# Patient Record
Sex: Female | Born: 1955 | Race: White | Hispanic: No | Marital: Married | Smoking: Former smoker
Health system: Southern US, Community
[De-identification: ages and names within clinical notes are randomized; demographics above are authoritative.]

## PROBLEM LIST (undated history)

## (undated) DIAGNOSIS — K589 Irritable bowel syndrome without diarrhea: Secondary | ICD-10-CM

## (undated) DIAGNOSIS — J449 Chronic obstructive pulmonary disease, unspecified: Secondary | ICD-10-CM

## (undated) DIAGNOSIS — F3170 Bipolar disorder, currently in remission, most recent episode unspecified: Secondary | ICD-10-CM

## (undated) DIAGNOSIS — J40 Bronchitis, not specified as acute or chronic: Secondary | ICD-10-CM

## (undated) DIAGNOSIS — K5792 Diverticulitis of intestine, part unspecified, without perforation or abscess without bleeding: Secondary | ICD-10-CM

## (undated) DIAGNOSIS — F319 Bipolar disorder, unspecified: Secondary | ICD-10-CM

## (undated) DIAGNOSIS — I10 Essential (primary) hypertension: Secondary | ICD-10-CM

## (undated) DIAGNOSIS — F209 Schizophrenia, unspecified: Secondary | ICD-10-CM

## (undated) DIAGNOSIS — J45909 Unspecified asthma, uncomplicated: Secondary | ICD-10-CM

## (undated) DIAGNOSIS — E079 Disorder of thyroid, unspecified: Secondary | ICD-10-CM

## (undated) DIAGNOSIS — D649 Anemia, unspecified: Secondary | ICD-10-CM

## (undated) DIAGNOSIS — G43909 Migraine, unspecified, not intractable, without status migrainosus: Secondary | ICD-10-CM

## (undated) DIAGNOSIS — G629 Polyneuropathy, unspecified: Secondary | ICD-10-CM

## (undated) HISTORY — PX: FOOT SURGERY: SHX648

## (undated) HISTORY — PX: ABDOMINAL HYSTERECTOMY: SHX81

## (undated) HISTORY — PX: ELBOW SURGERY: SHX618

## (undated) HISTORY — DX: Bronchitis, not specified as acute or chronic: J40

## (undated) HISTORY — PX: KNEE SURGERY: SHX244

## (undated) HISTORY — PX: TONSILLECTOMY: SUR1361

## (undated) HISTORY — DX: Chronic obstructive pulmonary disease, unspecified: J44.9

## (undated) HISTORY — PX: OTHER SURGICAL HISTORY: SHX169

## (undated) HISTORY — DX: Bipolar disorder, currently in remission, most recent episode unspecified: F31.70

## (undated) HISTORY — DX: Disorder of thyroid, unspecified: E07.9

---

## 2000-11-08 ENCOUNTER — Ambulatory Visit (HOSPITAL_COMMUNITY): Admission: RE | Admit: 2000-11-08 | Discharge: 2000-11-08 | Payer: Self-pay | Admitting: Family Medicine

## 2000-11-08 ENCOUNTER — Encounter: Payer: Self-pay | Admitting: Family Medicine

## 2000-11-26 ENCOUNTER — Ambulatory Visit (HOSPITAL_COMMUNITY): Admission: RE | Admit: 2000-11-26 | Discharge: 2000-11-26 | Payer: Self-pay | Admitting: Family Medicine

## 2000-11-26 ENCOUNTER — Encounter: Payer: Self-pay | Admitting: Family Medicine

## 2001-02-18 ENCOUNTER — Ambulatory Visit (HOSPITAL_COMMUNITY): Admission: RE | Admit: 2001-02-18 | Discharge: 2001-02-18 | Payer: Self-pay | Admitting: Family Medicine

## 2001-02-18 ENCOUNTER — Encounter: Payer: Self-pay | Admitting: Family Medicine

## 2001-06-03 ENCOUNTER — Ambulatory Visit (HOSPITAL_COMMUNITY): Admission: RE | Admit: 2001-06-03 | Discharge: 2001-06-03 | Payer: Self-pay | Admitting: Family Medicine

## 2001-06-03 ENCOUNTER — Encounter: Payer: Self-pay | Admitting: Family Medicine

## 2001-06-27 ENCOUNTER — Encounter: Payer: Self-pay | Admitting: *Deleted

## 2001-06-27 ENCOUNTER — Observation Stay (HOSPITAL_COMMUNITY): Admission: EM | Admit: 2001-06-27 | Discharge: 2001-06-27 | Payer: Self-pay | Admitting: *Deleted

## 2001-06-27 ENCOUNTER — Encounter: Payer: Self-pay | Admitting: Family Medicine

## 2001-11-08 ENCOUNTER — Ambulatory Visit (HOSPITAL_COMMUNITY): Admission: RE | Admit: 2001-11-08 | Discharge: 2001-11-08 | Payer: Self-pay | Admitting: Family Medicine

## 2001-11-08 ENCOUNTER — Encounter: Payer: Self-pay | Admitting: Family Medicine

## 2001-12-21 ENCOUNTER — Ambulatory Visit (HOSPITAL_BASED_OUTPATIENT_CLINIC_OR_DEPARTMENT_OTHER): Admission: RE | Admit: 2001-12-21 | Discharge: 2001-12-21 | Payer: Self-pay | Admitting: Orthopedic Surgery

## 2002-06-13 ENCOUNTER — Encounter: Payer: Self-pay | Admitting: Family Medicine

## 2002-06-13 ENCOUNTER — Ambulatory Visit (HOSPITAL_COMMUNITY): Admission: RE | Admit: 2002-06-13 | Discharge: 2002-06-13 | Payer: Self-pay | Admitting: Family Medicine

## 2002-11-02 ENCOUNTER — Ambulatory Visit (HOSPITAL_COMMUNITY): Admission: RE | Admit: 2002-11-02 | Discharge: 2002-11-02 | Payer: Self-pay | Admitting: Family Medicine

## 2002-11-02 ENCOUNTER — Encounter: Payer: Self-pay | Admitting: Family Medicine

## 2002-12-11 ENCOUNTER — Encounter: Payer: Self-pay | Admitting: Family Medicine

## 2002-12-11 ENCOUNTER — Ambulatory Visit (HOSPITAL_COMMUNITY): Admission: RE | Admit: 2002-12-11 | Discharge: 2002-12-11 | Payer: Self-pay | Admitting: Family Medicine

## 2003-03-27 ENCOUNTER — Ambulatory Visit (HOSPITAL_COMMUNITY): Admission: RE | Admit: 2003-03-27 | Discharge: 2003-03-27 | Payer: Self-pay | Admitting: Family Medicine

## 2003-03-27 ENCOUNTER — Encounter: Payer: Self-pay | Admitting: Family Medicine

## 2003-11-28 ENCOUNTER — Ambulatory Visit (HOSPITAL_COMMUNITY): Admission: RE | Admit: 2003-11-28 | Discharge: 2003-11-28 | Payer: Self-pay | Admitting: Family Medicine

## 2004-06-25 ENCOUNTER — Ambulatory Visit (HOSPITAL_COMMUNITY): Admission: RE | Admit: 2004-06-25 | Discharge: 2004-06-25 | Payer: Self-pay | Admitting: Family Medicine

## 2004-07-04 ENCOUNTER — Ambulatory Visit (HOSPITAL_COMMUNITY): Admission: RE | Admit: 2004-07-04 | Discharge: 2004-07-04 | Payer: Self-pay | Admitting: Family Medicine

## 2004-10-12 ENCOUNTER — Emergency Department (HOSPITAL_COMMUNITY): Admission: EM | Admit: 2004-10-12 | Discharge: 2004-10-12 | Payer: Self-pay | Admitting: Emergency Medicine

## 2004-10-29 ENCOUNTER — Ambulatory Visit: Payer: Self-pay | Admitting: Orthopedic Surgery

## 2004-11-23 ENCOUNTER — Emergency Department (HOSPITAL_COMMUNITY): Admission: EM | Admit: 2004-11-23 | Discharge: 2004-11-23 | Payer: Self-pay | Admitting: Emergency Medicine

## 2004-12-03 ENCOUNTER — Ambulatory Visit: Payer: Self-pay | Admitting: Orthopedic Surgery

## 2004-12-09 ENCOUNTER — Ambulatory Visit (HOSPITAL_COMMUNITY): Admission: RE | Admit: 2004-12-09 | Discharge: 2004-12-09 | Payer: Self-pay | Admitting: Orthopedic Surgery

## 2004-12-15 ENCOUNTER — Ambulatory Visit: Payer: Self-pay | Admitting: Orthopedic Surgery

## 2005-01-13 ENCOUNTER — Ambulatory Visit (HOSPITAL_COMMUNITY): Admission: RE | Admit: 2005-01-13 | Discharge: 2005-01-13 | Payer: Self-pay | Admitting: Orthopedic Surgery

## 2005-01-13 ENCOUNTER — Ambulatory Visit: Payer: Self-pay | Admitting: Orthopedic Surgery

## 2005-01-15 ENCOUNTER — Ambulatory Visit: Payer: Self-pay | Admitting: Orthopedic Surgery

## 2005-01-16 ENCOUNTER — Encounter (HOSPITAL_COMMUNITY): Admission: RE | Admit: 2005-01-16 | Discharge: 2005-02-15 | Payer: Self-pay | Admitting: Orthopedic Surgery

## 2005-02-05 ENCOUNTER — Ambulatory Visit: Payer: Self-pay | Admitting: Orthopedic Surgery

## 2005-03-24 ENCOUNTER — Ambulatory Visit (HOSPITAL_COMMUNITY): Admission: RE | Admit: 2005-03-24 | Discharge: 2005-03-24 | Payer: Self-pay | Admitting: Family Medicine

## 2005-04-13 ENCOUNTER — Ambulatory Visit (HOSPITAL_COMMUNITY): Admission: RE | Admit: 2005-04-13 | Discharge: 2005-04-13 | Payer: Self-pay | Admitting: Family Medicine

## 2005-04-15 ENCOUNTER — Ambulatory Visit (HOSPITAL_COMMUNITY): Admission: RE | Admit: 2005-04-15 | Discharge: 2005-04-15 | Payer: Self-pay | Admitting: Family Medicine

## 2005-07-12 ENCOUNTER — Emergency Department (HOSPITAL_COMMUNITY): Admission: EM | Admit: 2005-07-12 | Discharge: 2005-07-12 | Payer: Self-pay | Admitting: Emergency Medicine

## 2005-12-06 ENCOUNTER — Emergency Department (HOSPITAL_COMMUNITY): Admission: EM | Admit: 2005-12-06 | Discharge: 2005-12-06 | Payer: Self-pay | Admitting: Emergency Medicine

## 2006-07-01 ENCOUNTER — Emergency Department (HOSPITAL_COMMUNITY): Admission: EM | Admit: 2006-07-01 | Discharge: 2006-07-01 | Payer: Self-pay | Admitting: Emergency Medicine

## 2006-12-02 ENCOUNTER — Ambulatory Visit (HOSPITAL_COMMUNITY): Admission: RE | Admit: 2006-12-02 | Discharge: 2006-12-02 | Payer: Self-pay | Admitting: Family Medicine

## 2006-12-27 ENCOUNTER — Ambulatory Visit (HOSPITAL_COMMUNITY): Admission: RE | Admit: 2006-12-27 | Discharge: 2006-12-27 | Payer: Self-pay | Admitting: Family Medicine

## 2007-02-02 ENCOUNTER — Ambulatory Visit (HOSPITAL_COMMUNITY): Admission: RE | Admit: 2007-02-02 | Discharge: 2007-02-02 | Payer: Self-pay | Admitting: Family Medicine

## 2007-02-06 ENCOUNTER — Emergency Department (HOSPITAL_COMMUNITY): Admission: EM | Admit: 2007-02-06 | Discharge: 2007-02-06 | Payer: Self-pay | Admitting: Emergency Medicine

## 2007-03-02 ENCOUNTER — Ambulatory Visit (HOSPITAL_COMMUNITY): Admission: RE | Admit: 2007-03-02 | Discharge: 2007-03-02 | Payer: Self-pay | Admitting: Podiatry

## 2007-08-15 ENCOUNTER — Ambulatory Visit: Payer: Self-pay | Admitting: Internal Medicine

## 2007-08-19 ENCOUNTER — Ambulatory Visit: Payer: Self-pay | Admitting: Cardiology

## 2007-08-19 ENCOUNTER — Ambulatory Visit (HOSPITAL_COMMUNITY): Admission: RE | Admit: 2007-08-19 | Discharge: 2007-08-19 | Payer: Self-pay | Admitting: Internal Medicine

## 2007-08-26 ENCOUNTER — Emergency Department (HOSPITAL_COMMUNITY): Admission: EM | Admit: 2007-08-26 | Discharge: 2007-08-26 | Payer: Self-pay | Admitting: Emergency Medicine

## 2007-09-25 ENCOUNTER — Emergency Department (HOSPITAL_COMMUNITY): Admission: EM | Admit: 2007-09-25 | Discharge: 2007-09-25 | Payer: Self-pay | Admitting: Emergency Medicine

## 2008-03-25 ENCOUNTER — Encounter: Payer: Self-pay | Admitting: Orthopedic Surgery

## 2008-03-25 ENCOUNTER — Emergency Department (HOSPITAL_COMMUNITY): Admission: EM | Admit: 2008-03-25 | Discharge: 2008-03-25 | Payer: Self-pay | Admitting: Emergency Medicine

## 2008-03-27 ENCOUNTER — Ambulatory Visit: Payer: Self-pay | Admitting: Orthopedic Surgery

## 2008-03-27 DIAGNOSIS — J8283 Eosinophilic asthma: Secondary | ICD-10-CM | POA: Insufficient documentation

## 2008-03-27 DIAGNOSIS — S52539A Colles' fracture of unspecified radius, initial encounter for closed fracture: Secondary | ICD-10-CM

## 2008-03-27 DIAGNOSIS — J45909 Unspecified asthma, uncomplicated: Secondary | ICD-10-CM | POA: Insufficient documentation

## 2008-03-27 HISTORY — DX: Colles' fracture of unspecified radius, initial encounter for closed fracture: S52.539A

## 2008-03-29 ENCOUNTER — Ambulatory Visit: Payer: Self-pay | Admitting: Orthopedic Surgery

## 2008-03-29 ENCOUNTER — Ambulatory Visit (HOSPITAL_COMMUNITY): Admission: RE | Admit: 2008-03-29 | Discharge: 2008-03-29 | Payer: Self-pay | Admitting: Orthopedic Surgery

## 2008-03-30 ENCOUNTER — Encounter: Payer: Self-pay | Admitting: Orthopedic Surgery

## 2008-04-02 ENCOUNTER — Ambulatory Visit: Payer: Self-pay | Admitting: Orthopedic Surgery

## 2008-04-10 ENCOUNTER — Ambulatory Visit: Payer: Self-pay | Admitting: Orthopedic Surgery

## 2008-04-23 ENCOUNTER — Ambulatory Visit: Payer: Self-pay | Admitting: Orthopedic Surgery

## 2008-04-23 ENCOUNTER — Telehealth: Payer: Self-pay | Admitting: Orthopedic Surgery

## 2008-05-07 ENCOUNTER — Telehealth: Payer: Self-pay | Admitting: Orthopedic Surgery

## 2008-05-21 ENCOUNTER — Ambulatory Visit: Payer: Self-pay | Admitting: Orthopedic Surgery

## 2008-05-23 ENCOUNTER — Telehealth: Payer: Self-pay | Admitting: Orthopedic Surgery

## 2008-05-23 ENCOUNTER — Ambulatory Visit: Payer: Self-pay | Admitting: Orthopedic Surgery

## 2008-05-25 ENCOUNTER — Encounter: Payer: Self-pay | Admitting: Orthopedic Surgery

## 2008-06-04 ENCOUNTER — Ambulatory Visit: Payer: Self-pay | Admitting: Orthopedic Surgery

## 2008-06-11 ENCOUNTER — Encounter (HOSPITAL_COMMUNITY): Admission: RE | Admit: 2008-06-11 | Discharge: 2008-07-11 | Payer: Self-pay | Admitting: Orthopedic Surgery

## 2008-06-11 ENCOUNTER — Encounter: Payer: Self-pay | Admitting: Orthopedic Surgery

## 2008-06-20 ENCOUNTER — Telehealth: Payer: Self-pay | Admitting: Orthopedic Surgery

## 2008-09-13 ENCOUNTER — Encounter: Payer: Self-pay | Admitting: Orthopedic Surgery

## 2008-12-15 ENCOUNTER — Emergency Department (HOSPITAL_COMMUNITY): Admission: EM | Admit: 2008-12-15 | Discharge: 2008-12-15 | Payer: Self-pay | Admitting: Emergency Medicine

## 2008-12-21 ENCOUNTER — Emergency Department (HOSPITAL_COMMUNITY): Admission: EM | Admit: 2008-12-21 | Discharge: 2008-12-21 | Payer: Self-pay | Admitting: Emergency Medicine

## 2008-12-22 ENCOUNTER — Emergency Department (HOSPITAL_COMMUNITY): Admission: EM | Admit: 2008-12-22 | Discharge: 2008-12-22 | Payer: Self-pay | Admitting: Emergency Medicine

## 2009-03-06 ENCOUNTER — Emergency Department (HOSPITAL_COMMUNITY): Admission: EM | Admit: 2009-03-06 | Discharge: 2009-03-06 | Payer: Self-pay | Admitting: Emergency Medicine

## 2009-03-24 ENCOUNTER — Emergency Department (HOSPITAL_COMMUNITY): Admission: EM | Admit: 2009-03-24 | Discharge: 2009-03-24 | Payer: Self-pay | Admitting: Emergency Medicine

## 2009-05-01 ENCOUNTER — Emergency Department (HOSPITAL_COMMUNITY): Admission: EM | Admit: 2009-05-01 | Discharge: 2009-05-01 | Payer: Self-pay | Admitting: Emergency Medicine

## 2009-07-30 ENCOUNTER — Ambulatory Visit (HOSPITAL_COMMUNITY): Admission: RE | Admit: 2009-07-30 | Discharge: 2009-07-30 | Payer: Self-pay | Admitting: Family Medicine

## 2009-08-11 ENCOUNTER — Emergency Department (HOSPITAL_COMMUNITY): Admission: EM | Admit: 2009-08-11 | Discharge: 2009-08-12 | Payer: Self-pay | Admitting: Emergency Medicine

## 2009-09-18 ENCOUNTER — Emergency Department (HOSPITAL_COMMUNITY): Admission: EM | Admit: 2009-09-18 | Discharge: 2009-09-19 | Payer: Self-pay | Admitting: Emergency Medicine

## 2009-10-02 ENCOUNTER — Ambulatory Visit (HOSPITAL_COMMUNITY): Admission: RE | Admit: 2009-10-02 | Discharge: 2009-10-02 | Payer: Self-pay | Admitting: Family Medicine

## 2010-04-10 ENCOUNTER — Ambulatory Visit (HOSPITAL_COMMUNITY): Admission: RE | Admit: 2010-04-10 | Discharge: 2010-04-10 | Payer: Self-pay | Admitting: Family Medicine

## 2010-07-06 ENCOUNTER — Emergency Department (HOSPITAL_COMMUNITY)
Admission: EM | Admit: 2010-07-06 | Discharge: 2010-07-06 | Payer: Self-pay | Source: Home / Self Care | Admitting: Emergency Medicine

## 2010-07-07 ENCOUNTER — Inpatient Hospital Stay (HOSPITAL_COMMUNITY)
Admission: EM | Admit: 2010-07-07 | Discharge: 2010-07-09 | Payer: Self-pay | Source: Home / Self Care | Attending: Family Medicine | Admitting: Family Medicine

## 2010-07-27 ENCOUNTER — Encounter: Payer: Self-pay | Admitting: Family Medicine

## 2010-09-15 LAB — DIFFERENTIAL
Basophils Absolute: 0 10*3/uL (ref 0.0–0.1)
Basophils Relative: 0 % (ref 0–1)
Eosinophils Absolute: 0 10*3/uL (ref 0.0–0.7)
Lymphs Abs: 0.7 10*3/uL (ref 0.7–4.0)
Neutrophils Relative %: 84 % — ABNORMAL HIGH (ref 43–77)

## 2010-09-15 LAB — BASIC METABOLIC PANEL
BUN: 4 mg/dL — ABNORMAL LOW (ref 6–23)
CO2: 28 mEq/L (ref 19–32)
Calcium: 9.4 mg/dL (ref 8.4–10.5)
Creatinine, Ser: 0.83 mg/dL (ref 0.4–1.2)
GFR calc Af Amer: >60 mL/min (ref >60)

## 2010-09-15 LAB — BLOOD GAS, ARTERIAL: pH, Arterial: 7.355 (ref 7.350–7.400)

## 2010-09-15 LAB — CBC
MCH: 30.6 pg (ref 26.0–34.0)
Platelets: 132 10*3/uL — ABNORMAL LOW (ref 150–400)
RBC: 4.71 MIL/uL (ref 3.87–5.11)
RDW: 13.5 % (ref 11.5–15.5)
WBC: 9.9 10*3/uL (ref 4.0–10.5)

## 2010-09-15 LAB — T4, FREE: Free T4: 1.27 ng/dL (ref 0.80–1.80)

## 2010-09-15 LAB — TSH: TSH: 0.888 u[IU]/mL (ref 0.350–4.500)

## 2010-09-24 LAB — URINALYSIS, ROUTINE W REFLEX MICROSCOPIC
Bilirubin Urine: NEGATIVE
Ketones, ur: NEGATIVE mg/dL
Nitrite: NEGATIVE
Protein, ur: NEGATIVE mg/dL
Specific Gravity, Urine: 1.015 (ref 1.005–1.030)
Urobilinogen, UA: 0.2 mg/dL (ref 0.0–1.0)

## 2010-09-24 LAB — CBC
HCT: 37.5 % (ref 36.0–46.0)
Hemoglobin: 12.6 g/dL (ref 12.0–15.0)
MCV: 88.1 fL (ref 78.0–100.0)
Platelets: 165 10*3/uL (ref 150–400)
RDW: 14.1 % (ref 11.5–15.5)

## 2010-09-24 LAB — DIFFERENTIAL
Basophils Absolute: 0 10*3/uL (ref 0.0–0.1)
Eosinophils Absolute: 0 10*3/uL (ref 0.0–0.7)
Eosinophils Relative: 0 % (ref 0–5)
Lymphocytes Relative: 12 % (ref 12–46)
Monocytes Absolute: 0.8 10*3/uL (ref 0.1–1.0)

## 2010-09-24 LAB — BASIC METABOLIC PANEL
BUN: 8 mg/dL (ref 6–23)
CO2: 27 mEq/L (ref 19–32)
Chloride: 101 mEq/L (ref 96–112)
Glucose, Bld: 109 mg/dL — ABNORMAL HIGH (ref 70–99)
Potassium: 3.9 mEq/L (ref 3.5–5.1)
Sodium: 136 mEq/L (ref 135–145)

## 2010-11-18 NOTE — Assessment & Plan Note (Signed)
[facility]                       [facility] OFFICE NOTE   NAME:Kristen Ryan, Kristen Ryan                      MRN:          [id number]  DATE:08/15/2007                            DOB:          06-Feb-1956    REFERRING PHYSICIAN:  Scott A. Gerda Diss, MD   PATIENT IDENTIFICATION:  The patient is a 55 year old who was referred  for evaluation for orthostatic hypotension.   HISTORY OF PRESENT ILLNESS:  The patient said over the past month, she  has had episodes of dizziness that have gotten worse.  She has had them  intermittently in the past.  She denies any frank syncope, however.   On describing the spell, she said they can occur at any time, more often  when she is changing position, but she could be standing.  She will lay  down quickly and rest for awhile and usually the spells resolve in less  than 5 minutes.  Occasionally she feels palpitations prior but not  always.   In talking to her, she gets up at 6 a.m.,  has a couple cups of coffee  and herbal tea, a couple of waters. At lunchtime, she will have cereal  and then a salad at about 3 or 4 o'clock and then a salad in the  evening. No meat or protein with this it appears. She will drink water  at dinner time. She says when she urinates it is a darker yellow.   ALLERGIES:  VISTARIL, TETRACYCLINE, MORPHINE, DEPACON, KLONOPIN,  __________, CODEINE, AMINOPHYLLINE, XANTHINE, ERYTHROMYCIN, TALWIN,  HYDROCODONE, TOPAMAX, DEMEROL, LITHIUM, PENICILLIN.   MEDICATIONS:  1. Abilify 30.  2. Synthroid 50 mcg.  3. Tricor 145.  4. Omeprazole 20.  5. Lamotrigine 200.  6. Benzatropine 2 b.i.d.  7. Xanax 1 t.i.d.  8. Sertraline 50 daily.  9. Ventolin inhaler, has not started yet.   PAST MEDICAL HISTORY:  1. Dizziness.  2. History of asthma.  3. Question history of bipolar disorder.   FAMILY HISTORY:  Negative for CAD.   SOCIAL HISTORY:  The patient is disabled, is divorced with one child.  Does  not smoke.   REVIEW OF SYSTEMS:  The patient had some cyanosis on one side of her  body as a child, this resolved. She  wears glasses.  Occasional  palpitations as noted, occasional reflux, occasional ankle swelling,  otherwise all systems reviewed negative to the above problem except as  noted above. Note the patient when seen in Dr. Fletcher Anon office had a  cortisol done that was normal, ACTH done that was normal.  Lipid panel,  cholesterol of 292, triglycerides 447, HDL of 50. Creatinine 1,  potassium of 5. Note orthostatics in his office were done with immediate  standing. There was no delay in the measurements. Blood pressure lying  150/70, on immediate sitting 120/70, on intermediate standing 110/70. No  pulses taken. Otherwise all systems negative.  See above problem except  as noted.   PHYSICAL EXAM:  The patient currently in no distress.  Blood pressure on  arrival was 116/80. She did note a little dizziness coming into the  office. Orthostatics,  blood pressure laying 100/74, pulse 62, sitting at  2 minutes 120/78, pulse 68, legs were tingling. Standing at 5 minutes  blood pressure 18/70, pulse 64.  HEENT:  Normocephalic, atraumatic, EOMI, PERRL.  Mucous membranes are  moist.  NECK:  JVP is normal.  No thyromegaly or bruits.  CARDIAC:  Regular rate and rhythm, S, S2.  No S3, no murmurs or clicks.  CHEST:  Nontender.  LUNGS:  Clear to auscultation.  ABDOMEN:  Supple, nontender, no hepatomegaly, no masses.  EXTREMITIES:  Good distal pulses.  No lower extremity edema.   A 12-lead EKG shows sinus rhythm at 77 beats per minute, and nonspecific  T-wave changes.   IMPRESSION:  1. Mrs. Kristen Ryan is a 55 year old with a history of dizziness. I did not      find that she was orthostatic today on examination.  On questioning      her, they can occur any time. She gets slight warmth with them,      question slightly vagal. Still she has had no syncope.  Looking at      her diet, she is  not getting too much in during the day.  She says      she is drinking water.  I would like to check a urinalysis and a      CBC today.  I would recommend continuing her medicines for now. I      would as salt tablet daily and increase her fluid intake.  I will      set to see her back in 4-6 weeks.  2. Dyslipidemia followed by Dr. Gerda Diss. Again this will need to be      followed on medical therapy.  3. Congenital cyanosis. I not sure what shunt this represented but it      is resolved  if she had it. I would check an echocardiogram to      confirm chamber sizes function.     Pricilla Riffle, MD, [facility]  Electronically Signed    PVR/MedQ  DD: 08/15/2007  DT: 08/16/2007  Job #: [id number]   cc:   Lorin Picket A. Gerda Diss, MD

## 2010-11-18 NOTE — Op Note (Signed)
NAME:  Kristen Ryan, Kristen Ryan               ACCOUNT NO.:  [id number]   MEDICAL RECORD NO.:  [id number]          PATIENT TYPE:  AMB   LOCATION:  DAY                           FACILITY:  [facility]   PHYSICIAN:  Vickki Hearing, M.D.DATE OF BIRTH:  05/03/1956   DATE OF PROCEDURE:  DATE OF DISCHARGE:                               OPERATIVE REPORT   HISTORY:  This is a 55 year old female fell at home, sustained a closed  fracture of her left distal radius, a Colles fracture.  She had  attempted closed reduction by the emergency room physician.  She was  sent to the office on March 27, 2008.  Evaluation of those films  revealed that the fracture was still displaced and we made  recommendation for surgery.   Informed consent was done in the office.   PREOPERATIVE DIAGNOSIS:  Closed left distal radius fracture, Colles  fracture.   POSTOPERATIVE DIAGNOSIS:  Closed left distal radius fracture, Colles  fracture.   PROCEDURE:  Closed reduction, percutaneous pinning and external  fixation, left wrist.   IMPLANTS:  Two 0.045 K-wires and a Synthes small fragment external  fixator.   SURGEON:  Vickki Hearing, MD.   ANESTHETIC:  General.   OPERATIVE FINDINGS:  Displaced closed left distal radius fracture.   PROCEDURE DETAILS:  The patient was identified in preop holding area.  Left wrist was marked for surgery countersign.  The patient was given  vancomycin due to PENICILLIN allergy with preliminary dose of Benadryl  due to several other drug allergies.  She tolerated that well, was taken  to the operating room where she had general anesthetic.  The left wrist  was then prepped and draped using DuraPrep in sterile technique.   Time-out procedure was completed.   Closed manipulation of the fracture was performed.  C-arm was used to  confirm the reduction.   Two 0.045 K-wires were placed across the fracture site position and  firmed on AP and lateral C-arm shots.   We then elevated  the tourniquet to 225 mmHg.   We made a straight incision over the second metacarpal, divided subcu  tissue.  Blunt dissection carried down to bone.  Subperiosteal  dissection was carried out and 2 half pins were placed in the  metacarpal.  This was confirmed by x-ray.   We then made a second incision in the distal portion of the forearm over  the radius.  Subcutaneous tissue was divided.  Superficial sensory  radial nerve was identified and protected.  Subperiosteal dissection  exposed the radius.  Two half pins were placed and then the fixator was  placed with traction on the arm and locked in place.  Repeat films  showed the fracture to be well reduced.  The wounds were closed with 3-0  nylon after copious amounts of irrigation.  We then injected the wounds  with 0.5% Marcaine.   We applied sterile dressings.  The tourniquet was released.  The hand  had good color, capillary refill as well.  The patient was extubated and  taken to recovery room in stable condition.   Fixation  will stay in place for 8 weeks.  The patient is discharged on  Percocet 7.5/325 one q.4 p.r.n. for pain #60, followup on Tuesday for  dressing change.      Vickki Hearing, M.D.  Electronically Signed     SEH/MEDQ  D:  03/29/2008  T:  03/30/2008  Job:  [id number]

## 2010-11-18 NOTE — Procedures (Signed)
NAMEGURNOOR, SLOOP               ACCOUNT NO.:  [id number]   MEDICAL RECORD NO.:  [id number]          PATIENT TYPE:  OUT   LOCATION:  RAD                           FACILITY:  [facility]   PHYSICIAN:  Gerrit Friends. Dietrich Pates, MD, FACCDATE OF BIRTH:  01-12-1956   DATE OF PROCEDURE:  08/20/2007  DATE OF DISCHARGE:                                ECHOCARDIOGRAM   REFERRING:  Gerda Diss and Pricilla Riffle, MD.   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   1. Technically adequate echocardiographic study.  2. Normal left atrium, right atrium and right ventricle.  3. Normal aortic valve; normal proximal ascending aorta.  4. Normal mitral and tricuspid valves.  5. Normal pulmonic valve; proximal pulmonary artery diameter at the      upper limit of normal.  6. Normal left ventricular size; no LVH; normal regional and global      systolic function.  7. Normal IVC.  8. Physiologic pericardial effusion.      Gerrit Friends. Dietrich Pates, MD, [facility]  Electronically Signed     RMR/MEDQ  D:  08/20/2007  T:  08/22/2007  Job:  [id number]

## 2010-11-18 NOTE — H&P (Signed)
NAMEBETHAN, ADAMEK               ACCOUNT NO.:  [id number]   MEDICAL RECORD NO.:  [id number]          PATIENT TYPE:  AMB   LOCATION:  DAY                           FACILITY:  [facility]   PHYSICIAN:  Cody M. Ulice Brilliant, D.P.M.  DATE OF BIRTH:  1956-04-21   DATE OF ADMISSION:  03/02/2007  DATE OF DISCHARGE:  LH                              HISTORY & PHYSICAL   HISTORY OF PRESENT ILLNESS:  Ms. Yetta Barre has a painful bony prominence on  the dorsal aspect of both feet which has been present for 2 years.  This  pain has caused radiation of pain down into her toes.  This seems to  have started following an injury in which she had a pair of steel-toed  shoes dropped upon the top of her foot followed by dropping a can of  vegetables on the top of her foot.  This caused pain immediately  afterwards, and then she started having discomfort radiating down into  the great and second toe.  Ms. Yetta Barre, when I first saw her was for a  cast shoe.  She was treated conservatively for deep peroneal nerve pain  with injection therapy which decreased her discomfort mildly to  moderately for a period of time only to have the condition return.   PAST MEDICAL HISTORY:  Significant for bipolar disease.   MEDICATIONS:  Her medicines ongoing at this point are Abilify, Ultram,  Flexeril, Synthroid, Cogentin, Sertraline, Lamictal, and Xanax.  She  periodically can and will take a Dilaudid for pain.   Medicines that she has had reactions to or does not tolerate include  VISTARIL, TETRACYCLINE AND ALL MEMBERS OF THIS ANTIBIOTIC CLASS,  MORPHINE, DEPAKOTE, KLONOPIN, RISPERDAL, CODEINE, XANTHINE,  ERYTHROMYCIN, TALWIN, HYDROCODONE, IMITREX, CLOXACILLIN, TOPAMAX,  DEMEROL, and IBUPROFEN.   OBJECTIVE:  Ms. Yetta Barre is a friendly, relatively anxious white female who  is noted over the dorsal aspect of her right foot; namely at the base of  the first metatarsal cuneiform joint, at the base of the second  metatarsal intermediate  cuneiform joint noted to have a fairly large  bony prominence.  Radiographs are taken, and she has a lot of  osteophytic spurring at these two above-noted areas.  The second  metatarsal intermediate cuneiform is the worst.  She does have a  positive Tinel's sign radiating down into her toes upon deep palpation  of the second metatarsal cuneiform saddle bone deformity.   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   PLAN:  She has been treated conservatively with injection therapy.  No  oral medication has been given as she has so many medicines she does not  do well with.  Planned surgical correction will consist of excision of  the hypertrophic exostosis.  This will consist of two incisions as we  cannot get each with one incision unless we went over the dorsal aspect  of her foot in a transverse fashion which may possibly cause  neurovascular compromise.  We will do this under monitored anesthesia  care at [facility].  Postoperatively, we  will probably go with oxycodone  as she has not  had a contraindication to taking that.  I think Dilaudid probably would  be a little too strong.  We will see her postoperatively in [place] as  Wednesday will be my last day in the [place] office, and we discussed  that prior to the procedure.  She has read the consent form and to my  knowledge apparently understood and signed.                                            ______________________________  Denny Peon. Ulice Brilliant, D.P.M.     CMD/MEDQ  D:  03/01/2007  T:  03/01/2007  Job:  [id number]

## 2010-11-18 NOTE — Op Note (Signed)
Kristen Ryan, Kristen Ryan               ACCOUNT NO.:  [id number]   MEDICAL RECORD NO.:  [id number]          PATIENT TYPE:  AMB   LOCATION:  DAY                           FACILITY:  [facility]   PHYSICIAN:  Cody M. Ulice Brilliant, D.P.M.  DATE OF BIRTH:  Dec 08, 1955   DATE OF PROCEDURE:  03/02/2007  DATE OF DISCHARGE:                               OPERATIVE REPORT   PREOPERATIVE DIAGNOSIS:  Hypertrophic exostosis, dorsal aspect right  foot x2.   POSTOPERATIVE DIAGNOSIS:  Hypertrophic exostosis, dorsal aspect right  foot x2.   PROCEDURE PERFORMED:  Excision of hypertrophic exostosis from base of  first metatarsal medial cuneiform joint.  Excision of hypertrophic  exostosis, base of second metatarsal intermediate cuneiform joint.   SURGEON:  Adam Phenix, D.P.M.   ANESTHESIA:  Monitored anesthesia care.   INDICATIONS FOR SURGERY:  Painful saddle bone deformity over the  dorsal aspect of his right foot.  The patient is noted to distinct  hypertrophic exostosis or bone spurs on the dorsal aspect of this foot.  This has caused problems with pain which shoe gear.  The base of the  second metatarsal intermediate cuneiform joint actually is underlying  the deep peroneal nerve which has caused nerve irritation.  This has  been treated conservatively short-term with injection therapy, although  the pain has recurred.   CLINICAL FINDINGS:  The patient is noted to have enlarged spurs on the  base of the second met cuneiform joint and at the base of the first  metatarsal cuneiform joint which are excised utilizing curved osteotome  and pneumatic rasp.   DESCRIPTION OF PROCEDURE:  Ms. Kristen Ryan was brought into the OR and  placed on table in supine position.  IV sedation was established.  A V  block was applied to proximal to the planned incision site utilizing 10  mL of lidocaine and Marcaine in a one-to-one mixture.  A pneumatic ankle  tourniquet is then applied across Kristen Ryan right ankle over appropriate cast  padding.  Kristen Ryan foot was then prepped and draped in the usual aseptic  fashion.  An Ace bandage utilized to exsanguinate Kristen Ryan foot.  The  tourniquet inflated to 250 mmHg.   PROCEDURE:  (1)  Excision of hypertrophic exostosis from the base of  first metatarsal medial cuneiform joint of right foot.  Attention is  directed to this planned incision site.  A 4 cm slightly curvilinear  skin incision is made overlying the first metatarsal cuneiform joint.  The incision is deepened slowly and meticulously through subcutaneous  tissues with care taken to avoid neural and vascular structures.  All  crossing venous structures that cannot be retracted or clamped,  cauterized, the dissection was carried down to the level of deep fascia.  A deep fascial incision was made.  The periosteal tissues were reflected  away from the underlying bony surface.  The first metatarsal cuneiform  joint is appreciated.  The bony bridging overlying this joint is excised  utilizing curved osteotome and then following this with a rasp.  The  wound was flushed with copious amounts of irrigant.  Retraction is  removed  and the areas felt and found to be free of the above-noted  convexity of bone.  The wound was flushed again and deep fascia was  reapproximated closed 4-0 Vicryl.  Subcutaneous tissues were  reapproximated closed with 4-0 Vicryl in running horizontal mattress  suture and skin is closed with 4-0 Vicryl running subcuticular suture.  (2) Excision of hypertrophic exostosis, dorsal aspect of right foot at  second metatarsal cuneiform joint.  Attention was then directed to this  bony prominence.  A 4 cm slightly curvilinear skin incision was planned  and then carried forth with the #15 blade.  The incision was deepened  via sharp and blunt dissection with slow careful dissection with care  taken to identify the deep peroneal nerve, undermine it and retract it  medially.  The deep fascia was then incised overlying the  joint and  careful dissection is carried forth to get good exposure of this.  This  was then removed utilizing the curved osteotome and mallet along with  the pneumatic rasp.  The wound was flushed and it is found that a  significant and adequate amount of bony resection has been performed,  the wound was flushed.  Deep fascia was reapproximated and closed in 4-0  Vicryl, subcutaneous tissues were approximated and closed with 4-0  Vicryl in a running horizontal mattress suture and skin was closed with  4-0 Vicryl in a subcuticular suture.  Steri-Strips were applied across  both of these incisions and a postoperative injection of Marcaine and  Hexadrol was dispensed.  A Betadine-soaked Adaptic dressing and dry  sterile compressive dressing follows.  The tourniquet was then deflated  with tourniquet time spanning 51 minutes.   Kristen Ryan tolerated anesthesia and procedure well.  Kristen Ryan is  transported to day hospital without incident.  While there, a list of  written instructions were explained to Kristen Ryan.  Kristen Ryan is written prescription  for Percocet 5/325 Tylenol dispensed 30 one p.o. every four to six hours  p.c. p.r.n. and for Phenergan 25 mg, dispense 30 one p.o. every six  hours if needed for nausea.  Kristen Ryan is instructed on keeping the foot  elevated and keeping the bandage dry.  Kristen Ryan will be seen in 6 days for  first postop visit.           ______________________________  Denny Peon. Ulice Brilliant, D.P.M.     CMD/MEDQ  D:  03/02/2007  T:  03/03/2007  Job:  [id number]

## 2010-11-21 NOTE — Op Note (Signed)
[facility]  Patient:    Kristen Ryan, Kristen Ryan Visit Number: [id number] MRN: [id number]          Service Type: DSU Location: [facility] Attending Physician:  Ronne Binning Dictated by:   Nicki Reaper, M.D. Proc. Date: 12/21/01 Admit Date:  12/21/2001                             Operative Report  PREOPERATIVE DIAGNOSIS:  Tardy ulnar nerve palsy, left elbow.  POSTOPERATIVE DIAGNOSIS:  Tardy ulnar nerve palsy, left elbow.  OPERATION:  Decompression, left ulnar nerve.  SURGEON:   Nicki Reaper, M.D.  ASSISTANT:  Joaquin Courts, R.N.  ANESTHESIA:  General.  DATE OF OPERATION:  December 21, 2001  ANESTHESIOLOGIST:  Bedelia Person, M.D.  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  PROCEDURE:  The patient was brought to the operating room, a general anesthetic carried out without difficulty.  She was prepped and draped using Betadine scrub and solution in the supine position with the left arm free. The limb was exsanguinated with an Esmarch bandage.  A tourniquet placed high on the arm was inflated to 250 mmHg.  A straight incision was made over the medial epicondyle approximately 3-4 cm in length, carried down through subcutaneous tissue.  Bleeders were electrocauterized, neurovascular structures otherwise protected, dissection carried down to the [employer] fascia. This area was opened, identifying the ulnar nerve.  A release was then performed to the arcade of [place].  The medial intermuscular septum was maintained.  Dissection was then carried distally, a fasciotomy of the flexor carpi ulnaris performed.  A significant band was present at [facility] fascia. This was released.  A neurolysis was performed to the nerve but no transposition performed.  The arm was placed through full flexion-extension. No subluxation was  identified.  The wound was irrigated.  The subcutaneous tissue was closed with interrupted 4-0 Vicryl and the skin with subcuticular 4-0 Monocryl sutures.  Steri-Strips were applied, a sterile compressive dressing and splint applied with the elbow in approximately 50 degrees of flexion.  The patient tolerated the procedure well and was taken to the recovery room for observation in satisfactory condition.  DISPOSITION:  She is discharged home to return to [facility] in one week on Vicodin, Phenergan, and Keflex. Dictated by:   Nicki Reaper, M.D. Attending Physician:  Ronne Binning DD:  12/21/01 TD:  12/22/01 Job: [id number]

## 2010-11-21 NOTE — Procedures (Signed)
NAME:  Kristen Ryan, Kristen Ryan               ACCOUNT NO.:  [id number]   MEDICAL RECORD NO.:  [id number]          PATIENT TYPE:  EMS   LOCATION:  ED                            FACILITY:  [facility]   PHYSICIAN:  Edward L. Juanetta Gosling, M.D.DATE OF BIRTH:  06-25-1956   DATE OF PROCEDURE:  12/06/2005  DATE OF DISCHARGE:  12/06/2005                                EKG INTERPRETATION   EKG NUMBER:  [id number].   The rhythm is sinus rhythm with a rate of 50s.  There are Q waves in V1 and  V2 which may indicate a previous anterior infarction and clinical  correlation is suggested.   Abnormal electrocardiogram.      Oneal Deputy. Juanetta Gosling, M.D.  Electronically Signed     ELH/MEDQ  D:  12/07/2005  T:  12/08/2005  Job:  [id number]

## 2010-11-21 NOTE — Op Note (Signed)
NAME:  Kristen Ryan, Kristen Ryan NO.:  [id number]   MEDICAL RECORD NO.:  [id number]          PATIENT TYPE:  AMB   LOCATION:  DAY                           FACILITY:  [facility]   PHYSICIAN:  Vickki Hearing, M.D.DATE OF BIRTH:  05-Jan-1956   DATE OF PROCEDURE:  01/13/2005  DATE OF DISCHARGE:                                 OPERATIVE REPORT   HISTORY:  This is a 55 year old female who fell onto a flexed knee at [facility] in [place], [place] on April 7. She complained of anterior  knee pain, eventually had a MRI which showed a tear of the anterior horn of  the lateral meniscus with degenerative disease in the patellofemoral  compartment and a Baker cyst due. To persistent symptoms, we went ahead and  scheduled her for an arthroscopy of the left knee to address the meniscal  tear and the patellofemoral cartilage damage.   PREOPERATIVE DIAGNOSIS:  Lateral meniscal tear, chondromalacia patella, left  knee.   POSTOPERATIVE DIAGNOSIS:  Chondral flap tear medial condyle, chondromalacia  patella, left knee.   PROCEDURE:  Arthroscopy, left knee.   SURGEON:  Dr. Romeo Apple, no assistants.   ANESTHETIC:  LMA/general.   SPECIMENS:  None.   BLOOD LOSS:  None.   COMPLICATIONS:  None.   OPERATIVE FINDINGS:  There was a chondral flap tear of the medial femoral  condyle on the weightbearing surface. There was a grade 2 chondral lesion of  the patella which was a fissure, and there was chondral damage of the  lateral tibial plateau near the ACL insertion. Procedure was done as  follows:  The patient was identified as Kristen Ryan the preop holding  area. Her left knee was marked as the surgical site, countersigned by the  surgeon. History and physical was updated. Antibiotics were given  prophylactically.   She was taken to the operating room for general anesthesia via LMA  technique. Once successful, her left knee was prepped and draped using  sterile technique after  which we took a time-out as required, and it was  completed including all parameters.   We injected knee with dilute ______________, did a diagnostic arthroscopy  via lateral portal. We established a medial portal, performed a  chondroplasty using a straight motorized shaver. There was approximately 50%  cartilage loss in an area approximately 15 mm wide x 5 mm long.   We then addressed the chondral damage of the lateral tibial plateau. We used  a shaver for that as well. We then took an ArthroCare wand, put it on a low  setting, and contoured the edges of the chondral flap tear on the medial  condyle. The patellofemoral fissure required no treatment.   Knee was irrigated, suctioned dry, injected with 30 cc of Marcaine. Steri-  Strips used to close the portal sites.       SEH/MEDQ  D:  01/13/2005  T:  01/13/2005  Job:  [id number]

## 2010-11-21 NOTE — H&P (Signed)
Kristen Ryan, Kristen Ryan               ACCOUNT NO.:  [id number]   MEDICAL RECORD NO.:  [id number]          PATIENT TYPE:  AMB   LOCATION:  DAY                           FACILITY:  [facility]   PHYSICIAN:  Vickki Hearing, M.D.DATE OF BIRTH:  July 08, 1955   DATE OF ADMISSION:  DATE OF DISCHARGE:  LH                                HISTORY & PHYSICAL   CHIEF COMPLAINT:  Left knee pain.   HISTORY:  This is a 55 year old female who fell on October 10, 2004 at  [facility] in [place], [place].  She fell on a flexed knee and  complained of anterior knee pain.  At that time, she did not limp.  She did  not have a lot of swelling, catching, locking, or giving way, she has just  had pain behind the patella.  We diagnosed her with a patellar contusion  after initial films were normal.  We recommended that physical therapy  initially see the client and try to do exercises at home.  Over the next  several weeks, she continued to have pain and had pain on patellar  compression, pararetinacular symptoms.  Pain along the medial lateral facets  as well as the medial joint line.  Eventually, she came to have an MRI of  the left knee which showed a tear of the anterior horn of the lateral  meniscus, degenerative disease in the patellofemoral compartment, and a  baker's cyst.  Due to her persistent symptoms on June 12, we decided to go  ahead and schedule her for arthroscopy of the left knee to clean out the  lateral meniscal tear and to debride the patellofemoral cartilage if needed.   REVIEW OF SYSTEMS:  She has a history of asthma, bipolar disease,  schizophrenia, immunologic seasonal allergies.  Negative findings under  general, heart, GI, urinary, neuro, endocrine, skin, ENT, and lymph.   ALLERGIES:  1.  VISTARIL.  2.  AMINOPHYLLINE.  3.  ASPIRIN.  4.  PENICILLIN.   PAST MEDICAL HISTORY:  Includes asthma, bipolar disease, and schizophrenia.   She has had a cesarean section, hysterectomy,  episiotomy, laparoscopy,  tonsillectomy.   Pharmacy is [facility].   MEDICATIONS:  Synthroid, Cogentin, Lamictal, Abilify.   FAMILY HISTORY:  Asthma.   SOCIAL HISTORY:  Her primary care physician is Dr. Lilyan Punt.  Social  habits include smoking/alcohol:  No.  Caffeine:  Yes, two cups.  The highest  grade completed is 12 years of high school, three years of college.   PHYSICAL EXAMINATION:  VITAL SIGNS:  Weight 167.  Pulse 76, respiratory rate  16.  GENERAL APPEARANCE:  Grooming, hygiene, and nutrition are normal.  She has  no deformity.  Body habitus is ectomorphic.  CARDIOVASCULAR:  Pulses are normal.  No swelling, edema, or tenderness.  LYMPH NODES:  Without adenopathy.  NEUROLOGIC:  Gait and station are normal.  Her alignment is normal.  There  is no atrophy, contracture, subluxation seen or noted.  Range of motion is  full.  Strength and stability are normal.  Tenderness is noted on the medial  facet.  She has  a positive quadriceps contraction test.  She has pain and  tenderness actually in the lateral joint and lateral patellofemoral  compartment.  Range of motion remains full.  Muscle strength and tone are  normal.  SKIN:  No masses, lesions, or ulcers.  NEUROLOGIC:  Normal reflexes, sensation, and coordination.  She is alert and  oriented x3.   Initial films, again, are normal.  MRI shows torn lateral meniscus, anterior  horn, and patellofemoral cartilage with baker's cyst.   DIAGNOSIS:  Lateral meniscus tear, osteoarthritis of the patellofemoral  joint.   PLAN:  Arthroscopy.  Partial lateral meniscectomy and debridement of  patellofemoral compartment as needed.       SEH/MEDQ  D:  01/12/2005  T:  01/12/2005  Job:  [id number]

## 2010-11-21 NOTE — H&P (Signed)
[facility]  Patient:    Kristen Ryan, Kristen Ryan Visit Number: [id number] MRN: [id number]          Service Type: MED Location: 3A A320 01 Attending Physician:  Lilyan Punt Dictated by:   Lilyan Punt, M.D. Admit Date:  06/26/2001 Discharge Date: 06/27/2001                           History and Physical  SAME DAY OBSERVATION AND DISCHARGE  CHIEF COMPLAINT:  Abdominal pain.  HISTORY OF PRESENT ILLNESS:  This 55 year old white female had fairly sudden onset of right-sided abdominal pain.  She describes it as being very sharp and painful in nature.  She denies any dysuria, or urinary frequency.  She relates moderate nausea, no vomiting, denies diarrhea.  No cough, congestion or shortness of breath, PMH, endometriosis, migraines, depression, asthma, bipolar disease, TAHBSO.  SOCIAL HISTORY:  Divorced, does not smoke, does not drink.  FAMILY HISTORY:  Benign.  ALLERGIES:  The patient has had numerous side effects to medications including BENZODIAZEPINES and NORTRIPTYLINE causes drowsiness.  BETA BLOCKERS cause swelling, TOPAMAX causes hypersomnia so does Risperdal, IMITREX causes the patient to pass out.  The patient also lists PENICILLIN and ERYTHROMYCIN as allergies.  REVIEW OF SYSTEMS:  For above, negative.  MEDICATIONS:  Levbid 300 mg 4 daily, Prozac, 20 mg 1 b.i.d.; Geodan 80 mg b.i.d., felineum 100 mg q.d., Prilosec 20 mg q.d.,  PHYSICAL EXAMINATION:  GENERAL:  Looks to feel ill.  HEENT:  Benign.  CHEST:  CTA, RNL.  HEART:  Regular.  ABDOMEN:  Soft with mild-to-moderate right upper quadrant tenderness.  Patient is somewhat difficult to pinpoint exact area of tenderness.  EXTREMITIES:  No edema.  SKIN:  Warm and dry.  X-RAY AND LABORATORY DATA:  Show a normal white count, normal differential. MET-7 normal.  Liver normal, lipase and amylase normal.  X-ray shows some mild air-fluid levels.  HOSPITAL COURSE:  The patient underwent  ultrasound which was negative.  She continued to have some moderate right upper quadrant and right-sided abdominal pain, but states that the Stadol and Phenergan helped it.  It was noted that she had some repeat labs which were normal.  It is thought that the patient was having most likely a viral illness.  Cannot rule out the possibility of gallbladder dysfunction.  May need HIDA and/or CAT scan, and/or EGD as an outpatient if ongoing pain, but feel that it is reasonable to discharge the patient at this point in time and have her follow up in one week in the office.  She was given a prescription for oxycodone 500 mg, 1-2 q.4h. p.r.n. pain, Phenergan p.r.n. and to follow up if ongoing trouble. Dictated by:   Lilyan Punt, M.D. Attending Physician:  Lilyan Punt DD:  06/27/01 TD:  06/28/01 Job: [id number]

## 2010-11-21 NOTE — Procedures (Signed)
NAME:  Kristen Ryan, Kristen Ryan               ACCOUNT NO.:  [id number]   MEDICAL RECORD NO.:  [id number]          PATIENT TYPE:  EMS   LOCATION:  ED                            FACILITY:  [facility]   PHYSICIAN:  Edward L. Juanetta Gosling, M.D.DATE OF BIRTH:  [birth year]   DATE OF PROCEDURE:  12/06/2005  DATE OF DISCHARGE:  12/06/2005                                EKG INTERPRETATION   EKG NUMBER:  [id number].   The rhythm is sinus rhythm with a rate of 60s.  There are Q waves in V1 and  T2 which may indicate a previous anteroseptal myocardial infarction.  Clinical correlation is suggested.   Abnormal electrocardiogram.      Oneal Deputy. Juanetta Gosling, M.D.  Electronically Signed     ELH/MEDQ  D:  12/07/2005  T:  12/08/2005  Job:  [id number]

## 2011-04-06 LAB — BASIC METABOLIC PANEL
BUN: 10
CO2: 26
Calcium: 9.7
Chloride: 104
Creatinine, Ser: 0.85
GFR calc Af Amer: >60

## 2011-04-06 LAB — CBC
MCHC: 33.8
MCV: 85.9
Platelets: 189
RBC: 4.55
WBC: 8

## 2011-04-07 ENCOUNTER — Ambulatory Visit (HOSPITAL_COMMUNITY)
Admission: RE | Admit: 2011-04-07 | Discharge: 2011-04-07 | Disposition: A | Discharge status: Home / Self Care | Payer: Medicaid Other | Source: Ambulatory Visit | Attending: Family Medicine | Admitting: Family Medicine

## 2011-04-07 ENCOUNTER — Other Ambulatory Visit (HOSPITAL_COMMUNITY): Payer: Self-pay | Admitting: Family Medicine

## 2011-04-07 DIAGNOSIS — M25579 Pain in unspecified ankle and joints of unspecified foot: Secondary | ICD-10-CM | POA: Insufficient documentation

## 2011-04-07 DIAGNOSIS — R52 Pain, unspecified: Secondary | ICD-10-CM

## 2011-04-07 DIAGNOSIS — X58XXXA Exposure to other specified factors, initial encounter: Secondary | ICD-10-CM | POA: Insufficient documentation

## 2011-04-07 DIAGNOSIS — M25476 Effusion, unspecified foot: Secondary | ICD-10-CM | POA: Insufficient documentation

## 2011-04-07 DIAGNOSIS — M25473 Effusion, unspecified ankle: Secondary | ICD-10-CM | POA: Insufficient documentation

## 2011-04-07 DIAGNOSIS — S92919A Unspecified fracture of unspecified toe(s), initial encounter for closed fracture: Secondary | ICD-10-CM | POA: Insufficient documentation

## 2011-04-17 LAB — BASIC METABOLIC PANEL
Calcium: 9.3
Creatinine, Ser: 0.97
GFR calc Af Amer: >60
Sodium: 138

## 2011-04-17 LAB — HEMOGLOBIN AND HEMATOCRIT, BLOOD
HCT: 39.5
Hemoglobin: 13.1

## 2011-04-28 ENCOUNTER — Other Ambulatory Visit (HOSPITAL_COMMUNITY): Payer: Self-pay | Admitting: Family Medicine

## 2011-04-28 DIAGNOSIS — Z139 Encounter for screening, unspecified: Secondary | ICD-10-CM

## 2011-05-11 ENCOUNTER — Ambulatory Visit (HOSPITAL_COMMUNITY)
Admission: RE | Admit: 2011-05-11 | Discharge: 2011-05-11 | Disposition: A | Discharge status: Home / Self Care | Payer: No Typology Code available for payment source | Source: Ambulatory Visit | Attending: Family Medicine | Admitting: Family Medicine

## 2011-05-11 DIAGNOSIS — Z1231 Encounter for screening mammogram for malignant neoplasm of breast: Secondary | ICD-10-CM | POA: Insufficient documentation

## 2011-05-11 DIAGNOSIS — Z139 Encounter for screening, unspecified: Secondary | ICD-10-CM

## 2011-07-10 ENCOUNTER — Ambulatory Visit (HOSPITAL_COMMUNITY)
Admission: RE | Admit: 2011-07-10 | Discharge: 2011-07-10 | Disposition: A | Discharge status: Home / Self Care | Payer: No Typology Code available for payment source | Source: Ambulatory Visit | Attending: Family Medicine | Admitting: Family Medicine

## 2011-07-10 ENCOUNTER — Other Ambulatory Visit (HOSPITAL_COMMUNITY): Payer: Self-pay | Admitting: Family Medicine

## 2011-07-10 DIAGNOSIS — W19XXXA Unspecified fall, initial encounter: Secondary | ICD-10-CM

## 2011-07-10 DIAGNOSIS — M546 Pain in thoracic spine: Secondary | ICD-10-CM | POA: Insufficient documentation

## 2011-07-10 DIAGNOSIS — M545 Low back pain, unspecified: Secondary | ICD-10-CM | POA: Insufficient documentation

## 2011-09-10 ENCOUNTER — Other Ambulatory Visit (HOSPITAL_COMMUNITY): Payer: Self-pay | Admitting: Family Medicine

## 2011-09-10 ENCOUNTER — Ambulatory Visit (HOSPITAL_COMMUNITY)
Admission: RE | Admit: 2011-09-10 | Discharge: 2011-09-10 | Disposition: A | Discharge status: Home / Self Care | Payer: No Typology Code available for payment source | Source: Ambulatory Visit | Attending: Family Medicine | Admitting: Family Medicine

## 2011-09-10 DIAGNOSIS — M791 Myalgia, unspecified site: Secondary | ICD-10-CM

## 2011-09-10 DIAGNOSIS — M545 Low back pain, unspecified: Secondary | ICD-10-CM | POA: Insufficient documentation

## 2011-09-10 DIAGNOSIS — IMO0001 Reserved for inherently not codable concepts without codable children: Secondary | ICD-10-CM | POA: Insufficient documentation

## 2012-01-01 ENCOUNTER — Other Ambulatory Visit (HOSPITAL_COMMUNITY): Payer: Self-pay | Admitting: Family Medicine

## 2012-01-01 ENCOUNTER — Ambulatory Visit (HOSPITAL_COMMUNITY)
Admission: RE | Admit: 2012-01-01 | Discharge: 2012-01-01 | Disposition: A | Discharge status: Home / Self Care | Payer: No Typology Code available for payment source | Source: Ambulatory Visit | Attending: Family Medicine | Admitting: Family Medicine

## 2012-01-01 DIAGNOSIS — M549 Dorsalgia, unspecified: Secondary | ICD-10-CM

## 2012-01-01 DIAGNOSIS — M546 Pain in thoracic spine: Secondary | ICD-10-CM | POA: Insufficient documentation

## 2012-01-01 DIAGNOSIS — M51379 Other intervertebral disc degeneration, lumbosacral region without mention of lumbar back pain or lower extremity pain: Secondary | ICD-10-CM | POA: Insufficient documentation

## 2012-01-01 DIAGNOSIS — M545 Low back pain, unspecified: Secondary | ICD-10-CM | POA: Insufficient documentation

## 2012-01-01 DIAGNOSIS — M5137 Other intervertebral disc degeneration, lumbosacral region: Secondary | ICD-10-CM | POA: Insufficient documentation

## 2012-01-01 DIAGNOSIS — IMO0002 Reserved for concepts with insufficient information to code with codable children: Secondary | ICD-10-CM | POA: Insufficient documentation

## 2012-01-28 ENCOUNTER — Other Ambulatory Visit (HOSPITAL_COMMUNITY): Payer: Self-pay | Admitting: Family Medicine

## 2012-01-28 ENCOUNTER — Ambulatory Visit (HOSPITAL_COMMUNITY)
Admission: RE | Admit: 2012-01-28 | Discharge: 2012-01-28 | Disposition: A | Discharge status: Home / Self Care | Payer: No Typology Code available for payment source | Source: Ambulatory Visit | Attending: Family Medicine | Admitting: Family Medicine

## 2012-01-28 DIAGNOSIS — R0602 Shortness of breath: Secondary | ICD-10-CM | POA: Insufficient documentation

## 2012-01-28 DIAGNOSIS — R0789 Other chest pain: Secondary | ICD-10-CM | POA: Insufficient documentation

## 2012-01-28 DIAGNOSIS — W19XXXA Unspecified fall, initial encounter: Secondary | ICD-10-CM

## 2012-01-28 DIAGNOSIS — S298XXA Other specified injuries of thorax, initial encounter: Secondary | ICD-10-CM | POA: Insufficient documentation

## 2012-04-18 ENCOUNTER — Other Ambulatory Visit (HOSPITAL_COMMUNITY): Payer: Self-pay | Admitting: Family Medicine

## 2012-04-18 DIAGNOSIS — Z139 Encounter for screening, unspecified: Secondary | ICD-10-CM

## 2012-05-12 ENCOUNTER — Ambulatory Visit (HOSPITAL_COMMUNITY): Payer: No Typology Code available for payment source

## 2012-06-21 ENCOUNTER — Encounter (HOSPITAL_COMMUNITY): Payer: Self-pay | Admitting: *Deleted

## 2012-06-21 DIAGNOSIS — R296 Repeated falls: Secondary | ICD-10-CM | POA: Insufficient documentation

## 2012-06-21 DIAGNOSIS — Y929 Unspecified place or not applicable: Secondary | ICD-10-CM | POA: Insufficient documentation

## 2012-06-21 DIAGNOSIS — Z79899 Other long term (current) drug therapy: Secondary | ICD-10-CM | POA: Insufficient documentation

## 2012-06-21 DIAGNOSIS — IMO0002 Reserved for concepts with insufficient information to code with codable children: Secondary | ICD-10-CM | POA: Insufficient documentation

## 2012-06-21 DIAGNOSIS — Y9389 Activity, other specified: Secondary | ICD-10-CM | POA: Insufficient documentation

## 2012-06-21 DIAGNOSIS — X500XXA Overexertion from strenuous movement or load, initial encounter: Secondary | ICD-10-CM | POA: Insufficient documentation

## 2012-06-21 DIAGNOSIS — J45909 Unspecified asthma, uncomplicated: Secondary | ICD-10-CM | POA: Insufficient documentation

## 2012-06-21 NOTE — ED Notes (Signed)
Pt reporting pain in lower back after falling.  Pt reports twisting back and feeling pop.

## 2012-06-22 ENCOUNTER — Emergency Department (HOSPITAL_COMMUNITY)
Admission: EM | Admit: 2012-06-22 | Discharge: 2012-06-22 | Disposition: A | Discharge status: Home / Self Care | Payer: No Typology Code available for payment source | Attending: Emergency Medicine | Admitting: Emergency Medicine

## 2012-06-22 DIAGNOSIS — M549 Dorsalgia, unspecified: Secondary | ICD-10-CM

## 2012-06-22 HISTORY — DX: Unspecified asthma, uncomplicated: J45.909

## 2012-06-22 MED ORDER — ACETAMINOPHEN 325 MG PO TABS: 650.0000 mg | ORAL_TABLET | Freq: Once | ORAL | Status: DC

## 2012-06-22 MED ORDER — HYDROCODONE-ACETAMINOPHEN 5-325 MG PO TABS: 1.0000 | ORAL_TABLET | ORAL | Status: AC | PRN

## 2012-06-22 MED ORDER — HYDROCODONE-ACETAMINOPHEN 5-325 MG PO TABS
2.0000 | ORAL_TABLET | Freq: Once | ORAL | Status: AC
  Administered 2012-06-22: 2 via ORAL
  Filled 2012-06-22: qty 2

## 2012-06-22 MED ORDER — IBUPROFEN 800 MG PO TABS: 800.0000 mg | ORAL_TABLET | Freq: Three times a day (TID) | ORAL | Status: DC
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
## 2012-07-29 ENCOUNTER — Emergency Department (HOSPITAL_COMMUNITY)
Admission: EM | Admit: 2012-07-29 | Discharge: 2012-07-29 | Disposition: A | Discharge status: Home / Self Care | Payer: No Typology Code available for payment source | Attending: Emergency Medicine | Admitting: Emergency Medicine

## 2012-07-29 ENCOUNTER — Emergency Department (HOSPITAL_COMMUNITY): Payer: No Typology Code available for payment source

## 2012-07-29 ENCOUNTER — Encounter (HOSPITAL_COMMUNITY): Payer: Self-pay | Admitting: *Deleted

## 2012-07-29 DIAGNOSIS — J029 Acute pharyngitis, unspecified: Secondary | ICD-10-CM | POA: Insufficient documentation

## 2012-07-29 DIAGNOSIS — R42 Dizziness and giddiness: Secondary | ICD-10-CM | POA: Insufficient documentation

## 2012-07-29 DIAGNOSIS — J440 Chronic obstructive pulmonary disease with acute lower respiratory infection: Secondary | ICD-10-CM | POA: Insufficient documentation

## 2012-07-29 DIAGNOSIS — Z7982 Long term (current) use of aspirin: Secondary | ICD-10-CM | POA: Insufficient documentation

## 2012-07-29 DIAGNOSIS — R05 Cough: Secondary | ICD-10-CM | POA: Insufficient documentation

## 2012-07-29 DIAGNOSIS — J40 Bronchitis, not specified as acute or chronic: Secondary | ICD-10-CM

## 2012-07-29 DIAGNOSIS — Z79899 Other long term (current) drug therapy: Secondary | ICD-10-CM | POA: Insufficient documentation

## 2012-07-29 DIAGNOSIS — R059 Cough, unspecified: Secondary | ICD-10-CM | POA: Insufficient documentation

## 2012-07-29 DIAGNOSIS — R0602 Shortness of breath: Secondary | ICD-10-CM | POA: Insufficient documentation

## 2012-07-29 DIAGNOSIS — R51 Headache: Secondary | ICD-10-CM | POA: Insufficient documentation

## 2012-07-29 DIAGNOSIS — J3489 Other specified disorders of nose and nasal sinuses: Secondary | ICD-10-CM | POA: Insufficient documentation

## 2012-07-29 DIAGNOSIS — IMO0002 Reserved for concepts with insufficient information to code with codable children: Secondary | ICD-10-CM | POA: Insufficient documentation

## 2012-07-29 DIAGNOSIS — J209 Acute bronchitis, unspecified: Secondary | ICD-10-CM | POA: Insufficient documentation

## 2012-07-29 DIAGNOSIS — R0989 Other specified symptoms and signs involving the circulatory and respiratory systems: Secondary | ICD-10-CM | POA: Insufficient documentation

## 2012-07-29 MED ORDER — GUAIFENESIN-CODEINE 100-10 MG/5ML PO SYRP: 5.0000 mL | ORAL_SOLUTION | Freq: Three times a day (TID) | ORAL | Status: DC | PRN

## 2012-07-29 MED ORDER — LEVOFLOXACIN 500 MG PO TABS: 500.0000 mg | ORAL_TABLET | Freq: Every day | ORAL | Status: DC

## 2012-07-29 NOTE — ED Provider Notes (Signed)
History     CSN: [id number]  Arrival date & time 07/29/12  1657   None     Chief Complaint  Patient presents with  . Fever   HPI Kristen Ryan is a 57 y.o. female who presents to the ED with fever. The fever started today and went up to 102. Associated symptoms include cough, sore throat, congestion and lungs feel hot and aching. She denies nausea, vomiting or diarrhea. Has had similar problems when has had asthma or bronchitis. Patient has COPD. Has not smoked in 4 years. Has used inhaler today and does not feel like wheezing or short of breath.  She did receive her influenza vaccine this year. The history was provided by the patient.  Past Medical History  Diagnosis Date  . Asthma     Past Surgical History  Procedure Date  . Abdominal hysterectomy   . Tonsillectomy   . Joint replacement     No family history on file.  History  Substance Use Topics  . Smoking status: Never Smoker   . Smokeless tobacco: Not on file  . Alcohol Use: No    OB History    Grav Para Term Preterm Abortions TAB SAB Ect Mult Living                  Review of Systems  Constitutional: Positive for fever and chills. Negative for diaphoresis and fatigue.  HENT: Positive for congestion and sore throat. Negative for ear pain, facial swelling, neck pain, neck stiffness, dental problem and sinus pressure.   Eyes: Negative for photophobia, pain and discharge.  Respiratory: Positive for cough, shortness of breath and wheezing. Negative for chest tightness.   Cardiovascular: Positive for chest pain (with cough). Negative for palpitations.  Gastrointestinal: Negative for nausea, vomiting, abdominal pain, diarrhea, constipation and abdominal distention.  Genitourinary: Negative for dysuria, urgency, frequency, flank pain, vaginal bleeding, vaginal discharge and difficulty urinating.  Musculoskeletal: Positive for back pain. Negative for myalgias and gait problem.  Skin: Negative for color change and  rash.  Neurological: Positive for light-headedness and headaches. Negative for dizziness, speech difficulty, weakness and numbness.  Psychiatric/Behavioral: Negative for confusion and agitation. The patient is not nervous/anxious.        Hx of Bipolar    Allergies  Aminophylline; Sumatriptan; Theophylline; Divalproex sodium; Pentazocine lactate; Risperidone; and Tetracycline  Home Medications   Current Outpatient Rx  Name  Route  Sig  Dispense  Refill  . ALPRAZOLAM 1 MG PO TABS   Oral   Take 1 mg by mouth 3 (three) times daily as needed.         . ASPIRIN 81 MG PO TABS   Oral   Take 81 mg by mouth daily.         . BUDESONIDE-FORMOTEROL FUMARATE 160-4.5 MCG/ACT IN AERO   Inhalation   Inhale 2 puffs into the lungs 2 (two) times daily.         Marland Kitchen LAMOTRIGINE 200 MG PO TABS   Oral   Take 300 mg by mouth daily.         Marland Kitchen LEVOTHYROXINE SODIUM 50 MCG PO TABS   Oral   Take 50 mcg by mouth daily.         . QUETIAPINE FUMARATE 400 MG PO TABS   Oral   Take 400 mg by mouth at bedtime.         . TOPIRAMATE 100 MG PO TABS   Oral   Take 150 mg by  mouth 2 (two) times daily.            BP 105/92  Pulse 99  Temp 98 F (36.7 C) (Oral)  Resp 16  Ht 5\' 8"  (1.727 m)  Wt 158 lb (71.668 kg)  BMI 24.02 kg/m2  SpO2 100%  Physical Exam  Nursing note and vitals reviewed. Constitutional: She is oriented to person, place, and time. She appears well-developed and well-nourished.  HENT:  Head: Normocephalic and atraumatic.  Mouth/Throat: Uvula is midline and mucous membranes are normal. Posterior oropharyngeal erythema present. No oropharyngeal exudate.  Eyes: EOM are normal. Pupils are equal, round, and reactive to light.  Neck: Neck supple.  Cardiovascular: Normal rate and regular rhythm.   Pulmonary/Chest: Effort normal. No respiratory distress. Wheezes: occasional. She has rales.       Prolonged expirations   Abdominal: Soft. There is no tenderness.    Musculoskeletal: Normal range of motion. She exhibits no edema.  Neurological: She is alert and oriented to person, place, and time. No cranial nerve deficit.  Skin: Skin is warm and dry.  Psychiatric: She has a normal mood and affect. Her behavior is normal. Judgment and thought content normal.   Procedures  Dg Chest 2 View  07/29/2012  *RADIOLOGY REPORT*  Clinical Data: Cough, fever  CHEST - 2 VIEW  Comparison: 01/28/2012  Findings: Cardiomediastinal silhouette is stable.    Hyperinflation again noted.  No acute infiltrate or pulmonary edema.  Degenerative changes thoracic spine.  Stable mild compression deformity mid thoracic spine.  IMPRESSION: No active disease.  Hyperinflation again noted.   Original Report Authenticated By: Natasha Mead, M.D.     Assessment: 57 y.o. female with cough and flu like symptoms`   Bronchitis  Plan:  Levaquin   Robitussin AC   Follow up with PCP, return here as needed I have reviewed this patient's vital signs, nurses notes, appropriate labs and imaging.  I have discussed results with the patient and plan of care. Patient voices understanding.   Medication List     As of 07/29/2012  7:24 PM    START taking these medications         guaiFENesin-codeine 100-10 MG/5ML syrup   Commonly known as: ROBITUSSIN AC   Take 5 mLs by mouth 3 (three) times daily as needed for cough.      levofloxacin 500 MG tablet   Commonly known as: LEVAQUIN   Take 1 tablet (500 mg total) by mouth daily.      ASK your doctor about these medications         ALPRAZolam 1 MG tablet   Commonly known as: XANAX      aspirin 81 MG tablet      budesonide-formoterol 160-4.5 MCG/ACT inhaler   Commonly known as: SYMBICORT      lamoTRIgine 200 MG tablet   Commonly known as: LAMICTAL      levothyroxine 50 MCG tablet   Commonly known as: SYNTHROID, LEVOTHROID      QUEtiapine 400 MG tablet   Commonly known as: SEROQUEL      topiramate 100 MG tablet   Commonly known as: TOPAMAX           Where to get your medications    These are the prescriptions that you need to pick up.   You may get these medications from any pharmacy.         guaiFENesin-codeine 100-10 MG/5ML syrup   levofloxacin 500 MG tablet  [facility] Orlene Och, [place] 07/29/12 [id number]

## 2012-07-29 NOTE — ED Notes (Signed)
Woke up with fever, chills, cough and body aches this morning.

## 2012-07-30 NOTE — ED Provider Notes (Signed)
Medical screening examination/treatment/procedure(s) were performed by non-physician practitioner and as supervising physician I was immediately available for consultation/collaboration.  Flint Melter, MD 07/30/12 [phone]

## 2012-12-13 ENCOUNTER — Encounter (HOSPITAL_COMMUNITY): Payer: Self-pay

## 2012-12-13 ENCOUNTER — Emergency Department (HOSPITAL_COMMUNITY)
Admission: EM | Admit: 2012-12-13 | Discharge: 2012-12-13 | Disposition: A | Discharge status: Home / Self Care | Payer: No Typology Code available for payment source | Attending: Emergency Medicine | Admitting: Emergency Medicine

## 2012-12-13 DIAGNOSIS — IMO0002 Reserved for concepts with insufficient information to code with codable children: Secondary | ICD-10-CM | POA: Insufficient documentation

## 2012-12-13 DIAGNOSIS — L089 Local infection of the skin and subcutaneous tissue, unspecified: Secondary | ICD-10-CM | POA: Insufficient documentation

## 2012-12-13 DIAGNOSIS — Z880 Allergy status to penicillin: Secondary | ICD-10-CM | POA: Insufficient documentation

## 2012-12-13 DIAGNOSIS — L299 Pruritus, unspecified: Secondary | ICD-10-CM | POA: Insufficient documentation

## 2012-12-13 DIAGNOSIS — Z79899 Other long term (current) drug therapy: Secondary | ICD-10-CM | POA: Insufficient documentation

## 2012-12-13 DIAGNOSIS — Y939 Activity, unspecified: Secondary | ICD-10-CM | POA: Insufficient documentation

## 2012-12-13 DIAGNOSIS — Y929 Unspecified place or not applicable: Secondary | ICD-10-CM | POA: Insufficient documentation

## 2012-12-13 DIAGNOSIS — Z7982 Long term (current) use of aspirin: Secondary | ICD-10-CM | POA: Insufficient documentation

## 2012-12-13 DIAGNOSIS — J45901 Unspecified asthma with (acute) exacerbation: Secondary | ICD-10-CM | POA: Insufficient documentation

## 2012-12-13 MED ORDER — PREDNISONE 50 MG PO TABS
60.0000 mg | ORAL_TABLET | Freq: Once | ORAL | Status: AC
  Administered 2012-12-13: 60 mg via ORAL
  Filled 2012-12-13: qty 1

## 2012-12-13 MED ORDER — ONDANSETRON HCL 4 MG PO TABS
4.0000 mg | ORAL_TABLET | Freq: Once | ORAL | Status: AC
  Administered 2012-12-13: 4 mg via ORAL
  Filled 2012-12-13: qty 1

## 2012-12-13 MED ORDER — CLINDAMYCIN HCL 150 MG PO CAPS: 150.0000 mg | ORAL_CAPSULE | Freq: Three times a day (TID) | ORAL | Status: DC

## 2012-12-13 MED ORDER — PREDNISONE 10 MG PO TABS: ORAL_TABLET | ORAL | Status: DC

## 2012-12-13 MED ORDER — CLINDAMYCIN HCL 150 MG PO CAPS
300.0000 mg | ORAL_CAPSULE | Freq: Once | ORAL | Status: AC
  Administered 2012-12-13: 300 mg via ORAL
  Filled 2012-12-13: qty 2

## 2012-12-13 MED ORDER — SULFAMETHOXAZOLE-TMP DS 800-160 MG PO TABS
1.0000 | ORAL_TABLET | Freq: Once | ORAL | Status: DC
  Filled 2012-12-13: qty 1

## 2012-12-13 NOTE — ED Notes (Signed)
Insect bite, red and swollen, painful, and today it has been itching. Have been using neosporin and it is getting worse per pt.

## 2012-12-13 NOTE — ED Provider Notes (Signed)
History     CSN: [id number]  Arrival date & time 12/13/12  2134   First MD Initiated Contact with Patient 12/13/12 2334      Chief Complaint  Patient presents with  . Insect Bite  . Cellulitis    (Consider location/radiation/quality/duration/timing/severity/associated sxs/prior treatment) HPI Comments: Patient states that she sustained an insect bite a few days ago to the left wrist. Today she noted redness and swelling and it was somewhat painful at times. She noticed a" tingling sensation" from the wrist going down toward the elbow. The patient denies fever or chills. She has not seen any red streaking. His been no previous operations or procedures involving the left wrist or upper extremity. The patient denies any medical conditions that would cause a problem with her immune system. She has tried over-the-counter antibiotic creams, but states these are not working well. He  The history is provided by the patient.    Past Medical History  Diagnosis Date  . Asthma     Past Surgical History  Procedure Laterality Date  . Abdominal hysterectomy    . Tonsillectomy    . Joint replacement      No family history on file.  History  Substance Use Topics  . Smoking status: Never Smoker   . Smokeless tobacco: Not on file  . Alcohol Use: No    OB History   Grav Para Term Preterm Abortions TAB SAB Ect Mult Living                  Review of Systems  Constitutional: Negative for activity change.       All ROS Neg except as noted in HPI  HENT: Negative for nosebleeds and neck pain.   Eyes: Negative for photophobia and discharge.  Respiratory: Positive for wheezing. Negative for cough and shortness of breath.   Cardiovascular: Negative for chest pain and palpitations.  Gastrointestinal: Negative for abdominal pain and blood in stool.  Genitourinary: Negative for dysuria, frequency and hematuria.  Musculoskeletal: Negative for back pain and arthralgias.  Skin: Positive for  wound.  Neurological: Negative for dizziness, seizures and speech difficulty.  Psychiatric/Behavioral: Negative for hallucinations and confusion.    Allergies  Aminophylline; Sumatriptan; Theophylline; Codeine; Depakote; Divalproex sodium; Lithium; Penicillins; Pentazocine lactate; Risperidone; and Tetracycline  Home Medications   Current Outpatient Rx  Name  Route  Sig  Dispense  Refill  . ALPRAZolam (XANAX) 1 MG tablet   Oral   Take 1 mg by mouth 3 (three) times daily as needed.         Marland Kitchen aspirin 81 MG tablet   Oral   Take 81 mg by mouth daily.         . Calcium Carbonate-Vitamin D (CALCIUM 500 + D PO)   Oral   Take 1 tablet by mouth 2 (two) times daily.         . Carbamazepine (EQUETRO) 100 MG CP12   Oral   Take 100 mg by mouth 2 (two) times daily.         Marland Kitchen GARLIC PO   Oral   Take 1 capsule by mouth 2 (two) times daily.         Marland Kitchen lamoTRIgine (LAMICTAL) 200 MG tablet   Oral   Take 200 mg by mouth 2 (two) times daily.          Marland Kitchen levothyroxine (SYNTHROID, LEVOTHROID) 50 MCG tablet   Oral   Take 50 mcg by mouth daily.         Marland Kitchen  Multiple Vitamins-Minerals (HAIR/SKIN/NAILS PO)   Oral   Take 1 tablet by mouth 2 (two) times daily.         . QUEtiapine (SEROQUEL) 400 MG tablet   Oral   Take 400 mg by mouth at bedtime.         Marland Kitchen SALINE NASAL MIST NA   Nasal   Place 1 spray into the nose daily as needed (FOR NASAL CONGESTION).         Marland Kitchen clindamycin (CLEOCIN) 150 MG capsule   Oral   Take 1 capsule (150 mg total) by mouth 3 (three) times daily.   21 capsule   0   . predniSONE (DELTASONE) 10 MG tablet      5,4,3,2,1 - take with food   15 tablet   0     BP 148/91  Pulse 97  Temp(Src) 98.5 F (36.9 C) (Oral)  Resp 20  Ht 5\' 8"  (1.727 m)  Wt 157 lb (71.215 kg)  BMI 23.88 kg/m2  SpO2 100%  Physical Exam  Nursing note and vitals reviewed. Constitutional: She is oriented to person, place, and time. She appears well-developed and  well-nourished.  Non-toxic appearance.  HENT:  Head: Normocephalic.  Right Ear: Tympanic membrane and external ear normal.  Left Ear: Tympanic membrane and external ear normal.  Eyes: EOM and lids are normal. Pupils are equal, round, and reactive to light.  Neck: Normal range of motion. Neck supple. Carotid bruit is not present.  Cardiovascular: Normal rate, regular rhythm, normal heart sounds, intact distal pulses and normal pulses.   Pulmonary/Chest: Breath sounds normal. No respiratory distress.  Abdominal: Soft. Bowel sounds are normal. There is no tenderness. There is no guarding.  Musculoskeletal: Normal range of motion.       Arms: There is full range of motion of the left shoulder elbow wrist and fingers. Is no red streaking of the arm. There is no drainage from the wound site.  Lymphadenopathy:       Head (right side): No submandibular adenopathy present.       Head (left side): No submandibular adenopathy present.    She has no cervical adenopathy.  Neurological: She is alert and oriented to person, place, and time. She has normal strength. No cranial nerve deficit or sensory deficit.  Skin: Skin is warm and dry.  Psychiatric: She has a normal mood and affect. Her speech is normal.    ED Course  Procedures (including critical care time)  Labs Reviewed - No data to display No results found.   1. Skin infection       MDM  I have reviewed nursing notes, vital signs, and all appropriate lab and imaging results for this patient. Patient has a skin infection involving the left wrist. Is no red streaking. No reported fever or chills related to this infected area. There no palpable nodes in the axillary area.  Patient will be treated with clindamycin for infection. Patient states she's had a great deal of itching related to the insect bite, this will be treated with prednisone. Patient invited to return to the emergency department if any changes, problems, or  concerns.       Kathie Dike, PA-C 12/13/12 2347

## 2012-12-14 NOTE — ED Provider Notes (Signed)
Medical screening examination/treatment/procedure(s) were performed by non-physician practitioner and as supervising physician I was immediately available for consultation/collaboration.  Nicoletta Dress. Colon Branch, MD 12/14/12 [phone]

## 2013-03-10 ENCOUNTER — Emergency Department (HOSPITAL_COMMUNITY)
Admission: EM | Admit: 2013-03-10 | Discharge: 2013-03-10 | Disposition: A | Discharge status: Home / Self Care | Payer: No Typology Code available for payment source | Attending: Emergency Medicine | Admitting: Emergency Medicine

## 2013-03-10 ENCOUNTER — Encounter (HOSPITAL_COMMUNITY): Payer: Self-pay | Admitting: *Deleted

## 2013-03-10 DIAGNOSIS — J45901 Unspecified asthma with (acute) exacerbation: Secondary | ICD-10-CM | POA: Insufficient documentation

## 2013-03-10 DIAGNOSIS — Z7982 Long term (current) use of aspirin: Secondary | ICD-10-CM | POA: Insufficient documentation

## 2013-03-10 DIAGNOSIS — Z8659 Personal history of other mental and behavioral disorders: Secondary | ICD-10-CM | POA: Insufficient documentation

## 2013-03-10 DIAGNOSIS — L299 Pruritus, unspecified: Secondary | ICD-10-CM | POA: Insufficient documentation

## 2013-03-10 DIAGNOSIS — H538 Other visual disturbances: Secondary | ICD-10-CM | POA: Insufficient documentation

## 2013-03-10 DIAGNOSIS — F209 Schizophrenia, unspecified: Secondary | ICD-10-CM | POA: Insufficient documentation

## 2013-03-10 DIAGNOSIS — F319 Bipolar disorder, unspecified: Secondary | ICD-10-CM | POA: Insufficient documentation

## 2013-03-10 DIAGNOSIS — Z880 Allergy status to penicillin: Secondary | ICD-10-CM | POA: Insufficient documentation

## 2013-03-10 DIAGNOSIS — R21 Rash and other nonspecific skin eruption: Secondary | ICD-10-CM | POA: Insufficient documentation

## 2013-03-10 DIAGNOSIS — Z79899 Other long term (current) drug therapy: Secondary | ICD-10-CM | POA: Insufficient documentation

## 2013-03-10 HISTORY — DX: Bipolar disorder, unspecified: F31.9

## 2013-03-10 HISTORY — DX: Schizophrenia, unspecified: F20.9

## 2013-03-10 LAB — CBC
MCV: 93.2 fL (ref 78.0–100.0)
Platelets: 159 10*3/uL (ref 150–400)
RBC: 4.11 MIL/uL (ref 3.87–5.11)
WBC: 5.6 10*3/uL (ref 4.0–10.5)

## 2013-03-10 LAB — BASIC METABOLIC PANEL
CO2: 29 mEq/L (ref 19–32)
Calcium: 10.8 mg/dL — ABNORMAL HIGH (ref 8.4–10.5)
Chloride: 105 mEq/L (ref 96–112)
Potassium: 3.9 mEq/L (ref 3.5–5.1)
Sodium: 141 mEq/L (ref 135–145)

## 2013-03-10 NOTE — ED Provider Notes (Signed)
CSN: [id number]     Arrival date & time 03/10/13  1105 History  This chart was scribed for Kristen Roller, MD by Quintella Reichert, ED scribe.  This patient was seen in room [facility] and the patient's care was started at 12:08 PM.    Chief Complaint  Patient presents with  . Rash    The history is provided by the patient. No language interpreter was used.    HPI Comments: Kristen Ryan is a 57 y.o. female with h/o bipolar disorder, schizophrenia and asthma who presents to the Emergency Department complaining of generalized itchy rash onset 4 days ago.  Pt states that rash began on her legs and yesterday spread towards her feet and today spread over more of her body.  She also complains of SOB, blurred vision, and increased bruising.  She notes that she was placed on Carbamazepine for the first time in June and she is concerned she may be having an allergic reaction.  She denies any other recent medication changes or new creams, hygiene or cosmetic products, detergents, new clothes, or plant exposure.  She denies fever, vomiting, diarrhea, or cough.  Pt lives with her husband and states he does not have any similar symptoms.  She also has a dog at home.  She does not have children at home.  Pt does not smoke.   Past Medical History  Diagnosis Date  . Asthma   . Bipolar 1 disorder   . Schizophrenia     Past Surgical History  Procedure Laterality Date  . Abdominal hysterectomy    . Tonsillectomy    . Knee surgery    . Elbow surgery    . Foot surgery      History reviewed. No pertinent family history.   History  Substance Use Topics  . Smoking status: Never Smoker   . Smokeless tobacco: Not on file  . Alcohol Use: No    OB History   Grav Para Term Preterm Abortions TAB SAB Ect Mult Living                  Review of Systems A complete 10 system review of systems was obtained and all systems are negative except as noted in the HPI and PMH.    Allergies   Aminophylline; Sumatriptan; Theophylline; Codeine; Depakote; Divalproex sodium; Lithium; Penicillins; Pentazocine lactate; Risperidone; and Tetracycline  Home Medications   Current Outpatient Rx  Name  Route  Sig  Dispense  Refill  . albuterol (PROVENTIL HFA;VENTOLIN HFA) 108 (90 BASE) MCG/ACT inhaler   Inhalation   Inhale 2 puffs into the lungs every 6 (six) hours as needed for wheezing.         Marland Kitchen ALPRAZolam (XANAX) 1 MG tablet   Oral   Take 1 mg by mouth 3 (three) times daily as needed for sleep or anxiety.          Marland Kitchen aspirin 81 MG tablet   Oral   Take 81 mg by mouth daily.         . Calcium Carbonate-Vitamin D (CALCIUM 500 + D PO)   Oral   Take 1 tablet by mouth 2 (two) times daily.         . clindamycin (CLEOCIN) 150 MG capsule   Oral   Take 1 capsule (150 mg total) by mouth 3 (three) times daily.   21 capsule   0   . cyclobenzaprine (FLEXERIL) 10 MG tablet   Oral   Take 10 mg  by mouth 2 (two) times daily as needed for muscle spasms.         Marland Kitchen GARLIC PO   Oral   Take 1 capsule by mouth 2 (two) times daily.         Marland Kitchen HYDROcodone-acetaminophen (NORCO/VICODIN) 5-325 MG per tablet   Oral   Take 1 tablet by mouth every 6 (six) hours as needed for pain.         Marland Kitchen lamoTRIgine (LAMICTAL) 200 MG tablet   Oral   Take 200 mg by mouth 2 (two) times daily.          Marland Kitchen levothyroxine (SYNTHROID, LEVOTHROID) 50 MCG tablet   Oral   Take 50 mcg by mouth daily.         . Multiple Vitamins-Minerals (HAIR/SKIN/NAILS PO)   Oral   Take 1 tablet by mouth 2 (two) times daily.         . QUEtiapine (SEROQUEL) 400 MG tablet   Oral   Take 400 mg by mouth at bedtime.          BP 149/97  Pulse 94  Temp(Src) 99.4 F (37.4 C) (Oral)  Resp 20  Ht 5\' 7"  (1.702 m)  Wt 157 lb (71.215 kg)  BMI 24.58 kg/m2  SpO2 99%  Physical Exam  Nursing note and vitals reviewed. Constitutional: She appears well-developed and well-nourished. No distress.  HENT:  Head:  Normocephalic and atraumatic.  Mouth/Throat: Oropharynx is clear and moist. No oropharyngeal exudate.  Eyes: Conjunctivae and EOM are normal. Pupils are equal, round, and reactive to light. Right eye exhibits no discharge. Left eye exhibits no discharge. No scleral icterus.  Neck: Normal range of motion. Neck supple. No JVD present. No thyromegaly present.  Cardiovascular: Normal rate, regular rhythm, normal heart sounds and intact distal pulses.  Exam reveals no gallop and no friction rub.   No murmur heard. Pulmonary/Chest: Effort normal and breath sounds normal. No respiratory distress. She has no wheezes. She has no rales.  Abdominal: Soft. Bowel sounds are normal. She exhibits no distension and no mass. There is no tenderness.  Musculoskeletal: Normal range of motion. She exhibits no edema and no tenderness.  Lymphadenopathy:    She has no cervical adenopathy.  Neurological: She is alert. Coordination normal.  Skin: Skin is warm and dry. Rash noted.  Several small areas of bruising: right upper extremity medial, left thigh lateral Diffuse papular, excoriated, erythematous, scabbed-over rash to lower extremities.  No urticaria.  No PET/PUR.  Psychiatric: She has a normal mood and affect. Her behavior is normal.    ED Course  Procedures (including critical care time)  DIAGNOSTIC STUDIES: Oxygen Saturation is 99% on room air, normal by my interpretation.    COORDINATION OF CARE: 12:15 PM-Discussed treatment plan which includes labs with pt at bedside and pt agreed to plan.    Labs Review Labs Reviewed  BASIC METABOLIC PANEL - Abnormal; Notable for the following:    Calcium 10.8 (*)    GFR calc non Af Amer 71 (*)    GFR calc Af Amer 82 (*)    All other components within normal limits  CBC  PROTIME-INR    Imaging Review No results found.  MDM   1. Rash    Husband has no symptoms / rash - legs not itching anymore - unlikely scabies - possible related to the carbamazepine,  she has no oral lesions, not EM / SJS.  She is well appearing and can f/u outpt - no  hematological identifiable reason to have bruising - stable for d/c.  Labs unremarkable.  Results for orders placed during the hospital encounter of 03/10/13  BASIC METABOLIC PANEL      Result Value Range   Sodium 141  135 - 145 mEq/L   Potassium 3.9  3.5 - 5.1 mEq/L   Chloride 105  96 - 112 mEq/L   CO2 29  19 - 32 mEq/L   Glucose, Bld 99  70 - 99 mg/dL   BUN 10  6 - 23 mg/dL   Creatinine, Ser 2.13  0.50 - 1.10 mg/dL   Calcium 08.6 (*) 8.4 - 10.5 mg/dL   GFR calc non Af Amer 71 (*) >90 mL/min   GFR calc Af Amer 82 (*) >90 mL/min  CBC      Result Value Range   WBC 5.6  4.0 - 10.5 K/uL   RBC 4.11  3.87 - 5.11 MIL/uL   Hemoglobin 12.2  12.0 - 15.0 g/dL   HCT 57.8  46.9 - 62.9 %   MCV 93.2  78.0 - 100.0 fL   MCH 29.7  26.0 - 34.0 pg   MCHC 31.9  30.0 - 36.0 g/dL   RDW 52.8  41.3 - 24.4 %   Platelets 159  150 - 400 K/uL  PROTIME-INR      Result Value Range   Prothrombin Time 12.7  11.6 - 15.2 seconds   INR 0.97  0.00 - 1.49   No results found.    I personally performed the services described in this documentation, which was scribed in my presence. The recorded information has been reviewed and is accurate.      Kristen Roller, MD 03/10/13 [phone]

## 2013-03-10 NOTE — ED Notes (Addendum)
Rash to trunk, itches and hurts, sob, and notices increased bruising and blurred vision.thinks she is having a reaction to quertro.  Started this med in June.

## 2013-06-21 ENCOUNTER — Encounter (HOSPITAL_COMMUNITY): Payer: Self-pay | Admitting: Emergency Medicine

## 2013-06-21 ENCOUNTER — Emergency Department (HOSPITAL_COMMUNITY)
Admission: EM | Admit: 2013-06-21 | Discharge: 2013-06-22 | Disposition: A | Discharge status: Home / Self Care | Payer: No Typology Code available for payment source | Attending: Emergency Medicine | Admitting: Emergency Medicine

## 2013-06-21 DIAGNOSIS — Z880 Allergy status to penicillin: Secondary | ICD-10-CM | POA: Insufficient documentation

## 2013-06-21 DIAGNOSIS — F209 Schizophrenia, unspecified: Secondary | ICD-10-CM | POA: Insufficient documentation

## 2013-06-21 DIAGNOSIS — Z7982 Long term (current) use of aspirin: Secondary | ICD-10-CM | POA: Insufficient documentation

## 2013-06-21 DIAGNOSIS — Z792 Long term (current) use of antibiotics: Secondary | ICD-10-CM | POA: Insufficient documentation

## 2013-06-21 DIAGNOSIS — Z79899 Other long term (current) drug therapy: Secondary | ICD-10-CM | POA: Insufficient documentation

## 2013-06-21 DIAGNOSIS — J45909 Unspecified asthma, uncomplicated: Secondary | ICD-10-CM | POA: Insufficient documentation

## 2013-06-21 DIAGNOSIS — R109 Unspecified abdominal pain: Secondary | ICD-10-CM | POA: Insufficient documentation

## 2013-06-21 DIAGNOSIS — F319 Bipolar disorder, unspecified: Secondary | ICD-10-CM | POA: Insufficient documentation

## 2013-06-21 MED ORDER — HYDROMORPHONE HCL PF 1 MG/ML IJ SOLN
1.0000 mg | Freq: Once | INTRAMUSCULAR | Status: AC
  Administered 2013-06-22: 1 mg via INTRAVENOUS
  Filled 2013-06-21: qty 1

## 2013-06-21 MED ORDER — ONDANSETRON HCL 4 MG/2ML IJ SOLN
4.0000 mg | Freq: Once | INTRAMUSCULAR | Status: AC
  Administered 2013-06-22: 4 mg via INTRAVENOUS
  Filled 2013-06-21: qty 2

## 2013-06-21 MED ORDER — KETOROLAC TROMETHAMINE 30 MG/ML IJ SOLN
30.0000 mg | Freq: Once | INTRAMUSCULAR | Status: AC
  Administered 2013-06-22: 30 mg via INTRAVENOUS
  Filled 2013-06-21: qty 1

## 2013-06-21 NOTE — ED Notes (Signed)
Pt c/o rt flank pain x 2 hours.

## 2013-06-22 ENCOUNTER — Ambulatory Visit (HOSPITAL_COMMUNITY): Payer: No Typology Code available for payment source

## 2013-06-22 ENCOUNTER — Emergency Department (HOSPITAL_COMMUNITY): Payer: No Typology Code available for payment source

## 2013-06-22 ENCOUNTER — Ambulatory Visit (HOSPITAL_COMMUNITY)
Admission: RE | Admit: 2013-06-22 | Discharge: 2013-06-22 | Disposition: A | Discharge status: Home / Self Care | Payer: No Typology Code available for payment source | Source: Ambulatory Visit | Attending: Emergency Medicine | Admitting: Emergency Medicine

## 2013-06-22 DIAGNOSIS — R109 Unspecified abdominal pain: Secondary | ICD-10-CM | POA: Insufficient documentation

## 2013-06-22 LAB — CBC WITH DIFFERENTIAL/PLATELET
Basophils Absolute: 0 10*3/uL (ref 0.0–0.1)
Basophils Relative: 0 % (ref 0–1)
MCHC: 31.9 g/dL (ref 30.0–36.0)
Monocytes Absolute: 0.6 10*3/uL (ref 0.1–1.0)
Neutro Abs: 4.1 10*3/uL (ref 1.7–7.7)
Neutrophils Relative %: 56 % (ref 43–77)
Platelets: 200 10*3/uL (ref 150–400)
RDW: 13.4 % (ref 11.5–15.5)

## 2013-06-22 LAB — URINE MICROSCOPIC-ADD ON

## 2013-06-22 LAB — URINALYSIS, ROUTINE W REFLEX MICROSCOPIC
Glucose, UA: NEGATIVE mg/dL
Hgb urine dipstick: NEGATIVE
Protein, ur: NEGATIVE mg/dL
Specific Gravity, Urine: 1.01 (ref 1.005–1.030)

## 2013-06-22 LAB — COMPREHENSIVE METABOLIC PANEL
AST: 15 U/L (ref 0–37)
Albumin: 3.6 g/dL (ref 3.5–5.2)
Chloride: 97 mEq/L (ref 96–112)
Creatinine, Ser: 1.15 mg/dL — ABNORMAL HIGH (ref 0.50–1.10)
Total Bilirubin: 0.1 mg/dL — ABNORMAL LOW (ref 0.3–1.2)

## 2013-06-22 MED ORDER — HYDROMORPHONE HCL PF 1 MG/ML IJ SOLN
1.0000 mg | Freq: Once | INTRAMUSCULAR | Status: AC
  Administered 2013-06-22: 1 mg via INTRAVENOUS
  Filled 2013-06-22: qty 1

## 2013-06-22 MED ORDER — HYDROCODONE-ACETAMINOPHEN 5-325 MG PO TABS: 1.0000 | ORAL_TABLET | Freq: Four times a day (QID) | ORAL | Status: DC | PRN

## 2013-06-22 MED ORDER — CIPROFLOXACIN HCL 500 MG PO TABS: 500.0000 mg | ORAL_TABLET | Freq: Two times a day (BID) | ORAL | Status: DC

## 2013-06-22 NOTE — ED Notes (Signed)
Denies vomiting or diarrhea

## 2013-06-22 NOTE — ED Notes (Signed)
Patient given discharge instruction, verbalized understand. IV removed, band aid applied. Patient ambulatory out of the department.  

## 2013-06-22 NOTE — ED Notes (Signed)
Husband with pt . Advised pt and husband pt is not to drive, both understands reasoning.

## 2013-06-22 NOTE — ED Notes (Signed)
Informed pt that she is to come to the radiology dept in am at 1045 for scheduled [place] tomorrow. I told her that she is not to have anything to eat or drink until after the [place].

## 2013-06-22 NOTE — Discharge Instructions (Signed)
Follow up tomorrow for ultrasound of abdomen.  If ultrasound negative then follow up with your md this week.

## 2013-06-22 NOTE — ED Provider Notes (Signed)
CSN: [id number]     Arrival date & time 06/21/13  2335 History   First MD Initiated Contact with Patient 06/21/13 2339     Chief Complaint  Patient presents with  . Flank Pain   (Consider location/radiation/quality/duration/timing/severity/associated sxs/prior Treatment) Patient is a 57 y.o. female presenting with flank pain. The history is provided by the patient (the pt complains of right flank pain).  Flank Pain This is a new problem. The current episode started 6 to 12 hours ago. The problem occurs constantly. The problem has not changed since onset.Associated symptoms include abdominal pain. Pertinent negatives include no chest pain and no headaches. Nothing aggravates the symptoms. Nothing relieves the symptoms.    Past Medical History  Diagnosis Date  . Asthma   . Bipolar 1 disorder   . Schizophrenia    Past Surgical History  Procedure Laterality Date  . Abdominal hysterectomy    . Tonsillectomy    . Knee surgery    . Elbow surgery    . Foot surgery     History reviewed. No pertinent family history. History  Substance Use Topics  . Smoking status: Never Smoker   . Smokeless tobacco: Not on file  . Alcohol Use: No   OB History   Grav Para Term Preterm Abortions TAB SAB Ect Mult Living                 Review of Systems  Constitutional: Negative for appetite change and fatigue.  HENT: Negative for congestion, ear discharge and sinus pressure.   Eyes: Negative for discharge.  Respiratory: Negative for cough.   Cardiovascular: Negative for chest pain.  Gastrointestinal: Positive for abdominal pain. Negative for diarrhea.  Genitourinary: Positive for flank pain. Negative for frequency and hematuria.  Musculoskeletal: Negative for back pain.  Skin: Negative for rash.  Neurological: Negative for seizures and headaches.  Psychiatric/Behavioral: Negative for hallucinations.    Allergies  Aminophylline; Sumatriptan; Theophylline; Codeine; Depakote; Divalproex  sodium; Lithium; Penicillins; Pentazocine lactate; Risperidone; and Tetracycline  Home Medications   Current Outpatient Rx  Name  Route  Sig  Dispense  Refill  . albuterol (PROVENTIL HFA;VENTOLIN HFA) 108 (90 BASE) MCG/ACT inhaler   Inhalation   Inhale 2 puffs into the lungs every 6 (six) hours as needed for wheezing.         Marland Kitchen ALPRAZolam (XANAX) 1 MG tablet   Oral   Take 1 mg by mouth 3 (three) times daily as needed for sleep or anxiety.          Marland Kitchen aspirin 81 MG tablet   Oral   Take 81 mg by mouth daily.         . cyclobenzaprine (FLEXERIL) 10 MG tablet   Oral   Take 10 mg by mouth 2 (two) times daily as needed for muscle spasms.         Marland Kitchen lamoTRIgine (LAMICTAL) 200 MG tablet   Oral   Take 200 mg by mouth 2 (two) times daily.          Marland Kitchen levothyroxine (SYNTHROID, LEVOTHROID) 50 MCG tablet   Oral   Take 50 mcg by mouth daily.         . Calcium Carbonate-Vitamin D (CALCIUM 500 + D PO)   Oral   Take 1 tablet by mouth 2 (two) times daily.         . ciprofloxacin (CIPRO) 500 MG tablet   Oral   Take 1 tablet (500 mg total) by mouth 2 (two) times  daily. One po bid x 7 days   14 tablet   0   . clindamycin (CLEOCIN) 150 MG capsule   Oral   Take 1 capsule (150 mg total) by mouth 3 (three) times daily.   21 capsule   0   . GARLIC PO   Oral   Take 1 capsule by mouth 2 (two) times daily.         Marland Kitchen HYDROcodone-acetaminophen (NORCO/VICODIN) 5-325 MG per tablet   Oral   Take 1 tablet by mouth every 6 (six) hours as needed for pain.         Marland Kitchen HYDROcodone-acetaminophen (NORCO/VICODIN) 5-325 MG per tablet   Oral   Take 1 tablet by mouth every 6 (six) hours as needed for moderate pain.   20 tablet   0   . Multiple Vitamins-Minerals (HAIR/SKIN/NAILS PO)   Oral   Take 1 tablet by mouth 2 (two) times daily.         . QUEtiapine (SEROQUEL) 400 MG tablet   Oral   Take 400 mg by mouth at bedtime.          BP 107/56  Pulse 75  Temp(Src) 97.7 F (36.5  C) (Oral)  Resp 18  Ht 5' 7.5" (1.715 m)  Wt 160 lb (72.576 kg)  BMI 24.68 kg/m2  SpO2 99% Physical Exam  Constitutional: She is oriented to person, place, and time. She appears well-developed.  HENT:  Head: Normocephalic.  Eyes: Conjunctivae and EOM are normal. No scleral icterus.  Neck: Neck supple. No thyromegaly present.  Cardiovascular: Normal rate and regular rhythm.  Exam reveals no gallop and no friction rub.   No murmur heard. Pulmonary/Chest: No stridor. She has no wheezes. She has no rales. She exhibits no tenderness.  Abdominal: She exhibits no distension. There is no tenderness. There is no rebound.  Genitourinary:  Tender right flank  Musculoskeletal: Normal range of motion. She exhibits no edema.  Lymphadenopathy:    She has no cervical adenopathy.  Neurological: She is oriented to person, place, and time. She exhibits normal muscle tone. Coordination normal.  Skin: No rash noted. No erythema.  Psychiatric: She has a normal mood and affect. Her behavior is normal.    ED Course  Procedures (including critical care time) Labs Review Labs Reviewed  COMPREHENSIVE METABOLIC PANEL - Abnormal; Notable for the following:    Potassium 3.3 (*)    Creatinine, Ser 1.15 (*)    Total Bilirubin 0.1 (*)    GFR calc non Af Amer 52 (*)    GFR calc Af Amer 60 (*)    All other components within normal limits  URINALYSIS, ROUTINE W REFLEX MICROSCOPIC - Abnormal; Notable for the following:    Leukocytes, UA TRACE (*)    All other components within normal limits  URINE MICROSCOPIC-ADD ON - Abnormal; Notable for the following:    Squamous Epithelial / LPF FEW (*)    Bacteria, UA FEW (*)    All other components within normal limits  URINE CULTURE  CBC WITH DIFFERENTIAL   Imaging Review Ct Abdomen Pelvis Wo Contrast  06/22/2013   CLINICAL DATA:  Right flank pain for 2 hr  EXAM: CT ABDOMEN AND PELVIS WITHOUT CONTRAST  TECHNIQUE: Multidetector CT imaging of the abdomen and  pelvis was performed following the standard protocol without intravenous contrast.  COMPARISON:  None.  FINDINGS: BODY WALL: Unremarkable.  LOWER CHEST: Unremarkable.  ABDOMEN/PELVIS:  Liver: No focal abnormality.  Biliary: High-density material layers in the  gallbladder, most likely sludge.  Pancreas: Unremarkable.  Spleen: Unremarkable.  Adrenals: Unremarkable.  Kidneys and ureters: Symmetric fullness of the urinary collecting systems. No stone.  Bladder: Unremarkable.  Reproductive: Hysterectomy.  Bowel: Mid and distal duodenal diverticulum. Extensive, for age, distal colonic diverticulosis. Normal appendix.  Retroperitoneum: No mass or adenopathy.  Peritoneum: No free fluid or gas.  Vascular: No acute abnormality.  OSSEOUS: No acute abnormalities.  IMPRESSION: 1. No hydronephrosis or urolithiasis. 2. Normal appendix. 3. Colonic diverticulosis. 4. Probable gallbladder sludge.   Electronically Signed   By: Tiburcio Pea M.D.   On: 06/22/2013 01:09    EKG Interpretation   None       MDM   1. Flank pain   will treat with pain meds and cipro to cover uti or diverticulitis and have pt return for [place] of abd   Benny Lennert, MD 06/22/13 [phone]

## 2013-06-23 LAB — URINE CULTURE

## 2013-09-05 ENCOUNTER — Ambulatory Visit (HOSPITAL_COMMUNITY): Payer: No Typology Code available for payment source

## 2013-09-05 ENCOUNTER — Ambulatory Visit (HOSPITAL_COMMUNITY)
Admission: RE | Admit: 2013-09-05 | Discharge: 2013-09-05 | Disposition: A | Discharge status: Home / Self Care | Payer: Medicare Other | Source: Ambulatory Visit | Attending: Family Medicine | Admitting: Family Medicine

## 2013-09-05 ENCOUNTER — Other Ambulatory Visit (HOSPITAL_COMMUNITY): Payer: Self-pay | Admitting: Family Medicine

## 2013-09-05 DIAGNOSIS — M549 Dorsalgia, unspecified: Secondary | ICD-10-CM

## 2013-09-05 DIAGNOSIS — R29890 Loss of height: Secondary | ICD-10-CM | POA: Insufficient documentation

## 2013-09-05 DIAGNOSIS — M8448XA Pathological fracture, other site, initial encounter for fracture: Secondary | ICD-10-CM | POA: Insufficient documentation

## 2013-10-25 ENCOUNTER — Encounter (HOSPITAL_COMMUNITY): Payer: Self-pay | Admitting: Emergency Medicine

## 2013-10-25 ENCOUNTER — Emergency Department (HOSPITAL_COMMUNITY)
Admission: EM | Admit: 2013-10-25 | Discharge: 2013-10-26 | Disposition: A | Discharge status: Home / Self Care | Payer: Medicare Other | Attending: Emergency Medicine | Admitting: Emergency Medicine

## 2013-10-25 DIAGNOSIS — Z880 Allergy status to penicillin: Secondary | ICD-10-CM | POA: Insufficient documentation

## 2013-10-25 DIAGNOSIS — F319 Bipolar disorder, unspecified: Secondary | ICD-10-CM | POA: Insufficient documentation

## 2013-10-25 DIAGNOSIS — J45909 Unspecified asthma, uncomplicated: Secondary | ICD-10-CM | POA: Insufficient documentation

## 2013-10-25 DIAGNOSIS — M79605 Pain in left leg: Secondary | ICD-10-CM

## 2013-10-25 DIAGNOSIS — Z7982 Long term (current) use of aspirin: Secondary | ICD-10-CM | POA: Insufficient documentation

## 2013-10-25 DIAGNOSIS — Z9889 Other specified postprocedural states: Secondary | ICD-10-CM | POA: Insufficient documentation

## 2013-10-25 DIAGNOSIS — Z79899 Other long term (current) drug therapy: Secondary | ICD-10-CM | POA: Insufficient documentation

## 2013-10-25 DIAGNOSIS — M79609 Pain in unspecified limb: Secondary | ICD-10-CM | POA: Insufficient documentation

## 2013-10-25 DIAGNOSIS — F209 Schizophrenia, unspecified: Secondary | ICD-10-CM | POA: Insufficient documentation

## 2013-10-25 LAB — COMPREHENSIVE METABOLIC PANEL
ALBUMIN: 3.8 g/dL (ref 3.5–5.2)
ALT: 22 U/L (ref 0–35)
AST: 26 U/L (ref 0–37)
Alkaline Phosphatase: 111 U/L (ref 39–117)
BILIRUBIN TOTAL: 0.3 mg/dL (ref 0.3–1.2)
BUN: 22 mg/dL (ref 6–23)
CHLORIDE: 98 meq/L (ref 96–112)
CO2: 28 mEq/L (ref 19–32)
CREATININE: 1.02 mg/dL (ref 0.50–1.10)
Calcium: 9.9 mg/dL (ref 8.4–10.5)
GFR calc Af Amer: 69 mL/min — ABNORMAL LOW (ref >90)
GFR calc non Af Amer: 59 mL/min — ABNORMAL LOW (ref >90)
Glucose, Bld: 101 mg/dL — ABNORMAL HIGH (ref 70–99)
POTASSIUM: 3.7 meq/L (ref 3.7–5.3)
SODIUM: 139 meq/L (ref 137–147)
Total Protein: 7.2 g/dL (ref 6.0–8.3)

## 2013-10-25 LAB — CBC WITH DIFFERENTIAL/PLATELET
BASOS ABS: 0 10*3/uL (ref 0.0–0.1)
BASOS PCT: 0 % (ref 0–1)
Eosinophils Absolute: 0.5 10*3/uL (ref 0.0–0.7)
Eosinophils Relative: 6 % — ABNORMAL HIGH (ref 0–5)
HCT: 38.1 % (ref 36.0–46.0)
Hemoglobin: 12.1 g/dL (ref 12.0–15.0)
LYMPHS PCT: 27 % (ref 12–46)
Lymphs Abs: 2.3 10*3/uL (ref 0.7–4.0)
MCH: 27.8 pg (ref 26.0–34.0)
MCHC: 31.8 g/dL (ref 30.0–36.0)
MCV: 87.6 fL (ref 78.0–100.0)
MONO ABS: 0.6 10*3/uL (ref 0.1–1.0)
Monocytes Relative: 7 % (ref 3–12)
NEUTROS ABS: 5.2 10*3/uL (ref 1.7–7.7)
NEUTROS PCT: 60 % (ref 43–77)
PLATELETS: 234 10*3/uL (ref 150–400)
RBC: 4.35 MIL/uL (ref 3.87–5.11)
RDW: 14.2 % (ref 11.5–15.5)
WBC: 8.6 10*3/uL (ref 4.0–10.5)

## 2013-10-25 LAB — LITHIUM LEVEL: Lithium Lvl: 1.27 mEq/L (ref 0.80–1.40)

## 2013-10-25 LAB — D-DIMER, QUANTITATIVE (NOT AT ARMC): D DIMER QUANT: 0.53 ug{FEU}/mL — AB (ref 0.00–0.48)

## 2013-10-25 MED ORDER — IBUPROFEN 800 MG PO TABS
800.0000 mg | ORAL_TABLET | Freq: Once | ORAL | Status: AC
  Administered 2013-10-25: 800 mg via ORAL
  Filled 2013-10-25: qty 1

## 2013-10-25 MED ORDER — HYDROCODONE-ACETAMINOPHEN 5-325 MG PO TABS
1.0000 | ORAL_TABLET | Freq: Once | ORAL | Status: AC
Start: 2013-10-25 — End: 2013-10-25
  Administered 2013-10-25: 1 via ORAL
  Filled 2013-10-25: qty 1

## 2013-10-25 MED ORDER — ENOXAPARIN SODIUM 80 MG/0.8ML ~~LOC~~ SOLN
63.0000 mg | Freq: Once | SUBCUTANEOUS | Status: AC
  Administered 2013-10-25: 65 mg via SUBCUTANEOUS
  Filled 2013-10-25: qty 0.8

## 2013-10-25 NOTE — ED Provider Notes (Signed)
CSN: [id number]     Arrival date & time 10/25/13  2129 History  This chart was scribed for Glynn OctaveStephen Kaydenn Mclear, MD by Bennett Scrapehristina Taylor, ED Scribe. This patient was seen in room APA09/APA09 and the patient's care was started at 10:19 PM.   Chief Complaint  Patient presents with  . Leg Pain     The history is provided by the patient. No language interpreter was used.   HPI Comments: Kristen Ryan is a 58 y.o. female who presents to the Emergency Department complaining of left lower leg pain in left calf that started last night and has gotten gradually worse since the onset. She states when she is standing the pain is unbearable and feels as if her knee wants to buckle. She denies any recent falls or injuries. She denies trying any OTC medications at home to improve symptoms. She denies any h/o DVT or PE. She denies any h/o cardiac conditions or respiratory issues. She denies having chest pain or SOB. She states that she has been diagnosed with Bipolar disorder and Schizophrenia and was recently restarted on her lithium prescription one month ago after it was the identified cause of her hypothyroidism.   Past Medical History  Diagnosis Date  . Asthma   . Bipolar 1 disorder   . Schizophrenia    Past Surgical History  Procedure Laterality Date  . Abdominal hysterectomy    . Tonsillectomy    . Knee surgery    . Elbow surgery    . Foot surgery     History reviewed. No pertinent family history. History  Substance Use Topics  . Smoking status: Never Smoker   . Smokeless tobacco: Not on file  . Alcohol Use: No   No OB history provided.  Review of Systems  A complete 10 system review of systems was obtained and all systems are negative except as noted in the HPI and PMH.    Allergies  Aminophylline; Sumatriptan; Theophylline; Codeine; Depakote; Divalproex sodium; Lithium; Penicillins; Pentazocine lactate; Risperidone; and Tetracycline  Home Medications   Prior to Admission  medications   Medication Sig Start Date End Date Taking? Authorizing Provider  albuterol (PROVENTIL HFA;VENTOLIN HFA) 108 (90 BASE) MCG/ACT inhaler Inhale 2 puffs into the lungs every 6 (six) hours as needed for wheezing.   Yes Historical Provider, MD  ALPRAZolam Prudy Feeler(XANAX) 1 MG tablet Take 1 mg by mouth 3 (three) times daily as needed for sleep or anxiety.    Yes Historical Provider, MD  aspirin 81 MG tablet Take 81 mg by mouth daily.   Yes Historical Provider, MD  cyclobenzaprine (FLEXERIL) 10 MG tablet Take 10 mg by mouth 2 (two) times daily as needed for muscle spasms.   Yes Historical Provider, MD  levothyroxine (SYNTHROID, LEVOTHROID) 50 MCG tablet Take 50 mcg by mouth daily.   Yes Historical Provider, MD  lithium carbonate 300 MG capsule Take 300 mg by mouth at bedtime.   Yes Historical Provider, MD  sodium chloride (OCEAN) 0.65 % SOLN nasal spray Place 1 spray into both nostrils as needed for congestion.   Yes Historical Provider, MD   Triage Vitals: BP 153/89  Pulse 94  Temp(Src) 98.8 F (37.1 C) (Oral)  Resp 20  Ht 5\' 7"  (1.702 m)  Wt 140 lb (63.504 kg)  BMI 21.92 kg/m2  SpO2 96%  Physical Exam  Nursing note and vitals reviewed. Constitutional: She is oriented to person, place, and time. She appears well-developed and well-nourished. No distress.  HENT:  Head: Normocephalic  and atraumatic.  Eyes: EOM are normal.  Neck: Neck supple. No tracheal deviation present.  Cardiovascular: Normal rate and regular rhythm.   Pulmonary/Chest: Effort normal and breath sounds normal. No respiratory distress.  Musculoskeletal: Normal range of motion.  No appreciable asymmetry or swelling of lower legs, left calf tenderness, no right calf tenderness, Intact DP and PT pulses  Neurological: She is alert and oriented to person, place, and time.  Skin: Skin is warm and dry.  Psychiatric: She has a normal mood and affect. Her behavior is normal.    ED Course  Procedures (including critical care  time)  DIAGNOSTIC STUDIES: Oxygen Saturation is 96% on RA, Adequate by my interpretation.    COORDINATION OF CARE: 10:22 PM-Discussed treatment plan which includes pain mediecation CBC panel, CMP with pt at bedside and pt agreed to plan.     Labs Review Labs Reviewed  CBC WITH DIFFERENTIAL - Abnormal; Notable for the following:    Eosinophils Relative 6 (*)    All other components within normal limits  COMPREHENSIVE METABOLIC PANEL - Abnormal; Notable for the following:    Glucose, Bld 101 (*)    GFR calc non Af Amer 59 (*)    GFR calc Af Amer 69 (*)    All other components within normal limits  D-DIMER, QUANTITATIVE - Abnormal; Notable for the following:    D-Dimer, Quant 0.53 (*)    All other components within normal limits  LITHIUM LEVEL  CK TOTAL AND CKMB    Imaging Review No results found.   EKG Interpretation None      MDM   Final diagnoses:  Leg pain, left   two-day history of atraumatic left calf pain. Worse with weightbearing. No chest pain or shortness of breath. No weakness, numbness or tingling. Recently restarted on lithium. No history of blood clots.  Neurovascularly intact. No appreciable asymmetry. D-dimer 0.53.  Doppler not available.  Lithium 1.27  Chemistry normal.  Low suspicion for DVT but will dose lovenox and schedule doppler for tomorrow. No chest pain, SOB, tachycardia, or hypoxia to suggest PE.  BP 153/89  Pulse 94  Temp(Src) 98.8 F (37.1 C) (Oral)  Resp 20  Ht 5\' 7"  (1.702 m)  Wt 140 lb (63.504 kg)  BMI 21.92 kg/m2  SpO2 96%   I personally performed the services described in this documentation, which was scribed in my presence. The recorded information has been reviewed and is accurate.      Glynn OctaveStephen Dayvon Dax, MD 10/26/13 0111

## 2013-10-25 NOTE — ED Notes (Signed)
Pt c/o left lower leg pain and swelling. Pt denies any injury.

## 2013-10-25 NOTE — ED Notes (Signed)
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

## 2013-10-25 NOTE — Discharge Instructions (Signed)
Myalgia, Adult Follow up for your ultrasound tomorrow. Return to the ED if you develop new or worsening symptoms. Myalgia is the medical term for muscle pain. It is a symptom of many things. Nearly everyone at some time in their life has this. The most common cause for muscle pain is overuse or straining and more so when you are not in shape. Injuries and muscle bruises cause myalgias. Muscle pain without a history of injury can also be caused by a virus. It frequently comes along with the flu. Myalgia not caused by muscle strain can be present in a large number of infectious diseases. Some autoimmune diseases like lupus and fibromyalgia can cause muscle pain. Myalgia may be mild, or severe. SYMPTOMS  The symptoms of myalgia are simply muscle pain. Most of the time this is short lived and the pain goes away without treatment. DIAGNOSIS  Myalgia is diagnosed by your caregiver by taking your history. This means you tell him when the problems began, what they are, and what has been happening. If this has not been a long term problem, your caregiver may want to watch for a while to see what will happen. If it has been long term, they may want to do additional testing. TREATMENT  The treatment depends on what the underlying cause of the muscle pain is. Often anti-inflammatory medications will help. HOME CARE INSTRUCTIONS  If the pain in your muscles came from overuse, slow down your activities until the problems go away.  Myalgia from overuse of a muscle can be treated with alternating hot and cold packs on the muscle affected or with cold for the first couple days. If either heat or cold seems to make things worse, stop their use.  Apply ice to the sore area for 15-20 minutes, 03-04 times per day, while awake for the first 2 days of muscle soreness, or as directed. Put the ice in a plastic bag and place a towel between the bag of ice and your skin.  Only take over-the-counter or prescription medicines  for pain, discomfort, or fever as directed by your caregiver.  Regular gentle exercise may help if you are not active.  Stretching before strenuous exercise can help lower the risk of myalgia. It is normal when beginning an exercise regimen to feel some muscle pain after exercising. Muscles that have not been used frequently will be sore at first. If the pain is extreme, this may mean injury to a muscle. SEEK MEDICAL CARE IF:  You have an increase in muscle pain that is not relieved with medication.  You begin to run a temperature.  You develop nausea and vomiting.  You develop a stiff and painful neck.  You develop a rash.  You develop muscle pain after a tick bite.  You have continued muscle pain while working out even after you are in good condition. SEEK IMMEDIATE MEDICAL CARE IF: Any of your problems are getting worse and medications are not helping. MAKE SURE YOU:   Understand these instructions.  Will watch your condition.  Will get help right away if you are not doing well or get worse. Document Released: 05/14/2006 Document Revised: 09/14/2011 Document Reviewed: 08/03/2006 [facility] Patient Information 2014 [place]Care, [place].

## 2013-10-26 ENCOUNTER — Other Ambulatory Visit (HOSPITAL_COMMUNITY): Payer: Self-pay | Admitting: Emergency Medicine

## 2013-10-26 ENCOUNTER — Ambulatory Visit (HOSPITAL_COMMUNITY)
Admit: 2013-10-26 | Discharge: 2013-10-26 | Disposition: A | Discharge status: Home / Self Care | Payer: PRIVATE HEALTH INSURANCE | Source: Ambulatory Visit | Attending: Emergency Medicine | Admitting: Emergency Medicine

## 2013-10-26 DIAGNOSIS — M79609 Pain in unspecified limb: Secondary | ICD-10-CM | POA: Insufficient documentation

## 2013-10-26 DIAGNOSIS — M79669 Pain in unspecified lower leg: Secondary | ICD-10-CM

## 2013-10-26 DIAGNOSIS — Z87891 Personal history of nicotine dependence: Secondary | ICD-10-CM | POA: Insufficient documentation

## 2013-10-26 LAB — CK TOTAL AND CKMB (NOT AT ARMC)
CK, MB: 1.7 ng/mL (ref 0.3–4.0)
RELATIVE INDEX: 0.8 (ref 0.0–2.5)
Total CK: 210 U/L — ABNORMAL HIGH (ref 7–177)

## 2013-11-07 ENCOUNTER — Other Ambulatory Visit (HOSPITAL_COMMUNITY): Payer: Self-pay | Admitting: Family Medicine

## 2013-11-07 DIAGNOSIS — R52 Pain, unspecified: Secondary | ICD-10-CM

## 2013-11-08 ENCOUNTER — Other Ambulatory Visit (HOSPITAL_COMMUNITY): Payer: Self-pay | Admitting: Family Medicine

## 2013-11-09 ENCOUNTER — Other Ambulatory Visit (HOSPITAL_COMMUNITY): Payer: Self-pay | Admitting: Family Medicine

## 2013-11-09 ENCOUNTER — Ambulatory Visit (HOSPITAL_COMMUNITY)
Admission: RE | Admit: 2013-11-09 | Discharge: 2013-11-09 | Disposition: A | Discharge status: Home / Self Care | Payer: PRIVATE HEALTH INSURANCE | Source: Ambulatory Visit | Attending: Family Medicine | Admitting: Family Medicine

## 2013-11-09 DIAGNOSIS — Z87891 Personal history of nicotine dependence: Secondary | ICD-10-CM | POA: Insufficient documentation

## 2013-11-09 DIAGNOSIS — I10 Essential (primary) hypertension: Secondary | ICD-10-CM | POA: Insufficient documentation

## 2013-11-09 DIAGNOSIS — R52 Pain, unspecified: Secondary | ICD-10-CM

## 2013-11-09 DIAGNOSIS — I739 Peripheral vascular disease, unspecified: Secondary | ICD-10-CM | POA: Insufficient documentation

## 2013-11-09 DIAGNOSIS — E785 Hyperlipidemia, unspecified: Secondary | ICD-10-CM | POA: Insufficient documentation

## 2013-11-14 ENCOUNTER — Other Ambulatory Visit: Payer: Self-pay | Admitting: Adult Health

## 2013-11-28 ENCOUNTER — Telehealth: Payer: Self-pay

## 2013-11-28 NOTE — Telephone Encounter (Signed)
Left message I called 

## 2013-11-29 ENCOUNTER — Other Ambulatory Visit: Payer: Self-pay | Admitting: Adult Health

## 2013-12-05 ENCOUNTER — Telehealth: Payer: Self-pay | Admitting: Neurology

## 2013-12-05 NOTE — Telephone Encounter (Signed)
error 

## 2014-02-20 ENCOUNTER — Emergency Department (HOSPITAL_COMMUNITY)
Admission: EM | Admit: 2014-02-20 | Discharge: 2014-02-20 | Disposition: A | Discharge status: Home / Self Care | Payer: Medicare Other | Attending: Emergency Medicine | Admitting: Emergency Medicine

## 2014-02-20 ENCOUNTER — Encounter (HOSPITAL_COMMUNITY): Payer: Self-pay | Admitting: Emergency Medicine

## 2014-02-20 DIAGNOSIS — Z880 Allergy status to penicillin: Secondary | ICD-10-CM | POA: Diagnosis not present

## 2014-02-20 DIAGNOSIS — IMO0002 Reserved for concepts with insufficient information to code with codable children: Secondary | ICD-10-CM

## 2014-02-20 DIAGNOSIS — Z23 Encounter for immunization: Secondary | ICD-10-CM | POA: Insufficient documentation

## 2014-02-20 DIAGNOSIS — Z8659 Personal history of other mental and behavioral disorders: Secondary | ICD-10-CM | POA: Insufficient documentation

## 2014-02-20 DIAGNOSIS — W268XXA Contact with other sharp object(s), not elsewhere classified, initial encounter: Secondary | ICD-10-CM | POA: Diagnosis not present

## 2014-02-20 DIAGNOSIS — Z7982 Long term (current) use of aspirin: Secondary | ICD-10-CM | POA: Diagnosis not present

## 2014-02-20 DIAGNOSIS — Z79899 Other long term (current) drug therapy: Secondary | ICD-10-CM | POA: Diagnosis not present

## 2014-02-20 DIAGNOSIS — Y9289 Other specified places as the place of occurrence of the external cause: Secondary | ICD-10-CM | POA: Insufficient documentation

## 2014-02-20 DIAGNOSIS — S61409A Unspecified open wound of unspecified hand, initial encounter: Secondary | ICD-10-CM | POA: Insufficient documentation

## 2014-02-20 DIAGNOSIS — Y9389 Activity, other specified: Secondary | ICD-10-CM | POA: Insufficient documentation

## 2014-02-20 DIAGNOSIS — J45909 Unspecified asthma, uncomplicated: Secondary | ICD-10-CM | POA: Diagnosis not present

## 2014-02-20 MED ORDER — TETANUS-DIPHTH-ACELL PERTUSSIS 5-2.5-18.5 LF-MCG/0.5 IM SUSP
0.5000 mL | Freq: Once | INTRAMUSCULAR | Status: AC
  Administered 2014-02-20: 0.5 mL via INTRAMUSCULAR
  Filled 2014-02-20: qty 0.5

## 2014-02-20 MED ORDER — LIDOCAINE HCL (PF) 2 % IJ SOLN
10.0000 mL | Freq: Once | INTRAMUSCULAR | Status: DC
  Filled 2014-02-20: qty 10

## 2014-02-20 NOTE — ED Provider Notes (Signed)
CSN: [id number]     Arrival date & time 02/20/14  1703 History   First MD Initiated Contact with Patient 02/20/14 1829     Chief Complaint  Patient presents with  . Extremity Laceration     (Consider location/radiation/quality/duration/timing/severity/associated sxs/prior Treatment) The history is provided by the patient.   Kristen Ryan is a 58 y.o. female presenting with a laceration to her left hand after cutting herself with a kitchen knife as she was attempting to slice the plastic rings off of spice bottles.  She reports the laceration bled copiously but has stopped since applying pressure.  She has persistent pain at the site but denies weakness or numbness distal to the laceration site.  Her last tetanus was more than 10 years ago.  She has no other complaints at this time.     Past Medical History  Diagnosis Date  . Asthma   . Bipolar 1 disorder   . Schizophrenia    Past Surgical History  Procedure Laterality Date  . Abdominal hysterectomy    . Tonsillectomy    . Knee surgery    . Elbow surgery    . Foot surgery     History reviewed. No pertinent family history. History  Substance Use Topics  . Smoking status: Never Smoker   . Smokeless tobacco: Not on file  . Alcohol Use: No   OB History   Grav Para Term Preterm Abortions TAB SAB Ect Mult Living                 Review of Systems  Constitutional: Negative for fever and chills.  Respiratory: Negative for shortness of breath and wheezing.   Skin: Positive for wound.  Neurological: Negative for numbness.      Allergies  Aminophylline; Sumatriptan; Theophylline; Codeine; Depakote; Divalproex sodium; Lamictal; Lithium; Penicillins; Pentazocine lactate; Risperidone; and Tetracycline  Home Medications   Prior to Admission medications   Medication Sig Start Date End Date Taking? Authorizing Provider  albuterol (PROVENTIL HFA;VENTOLIN HFA) 108 (90 BASE) MCG/ACT inhaler Inhale 2 puffs into the lungs every  6 (six) hours as needed for wheezing.    Historical Provider, MD  ALPRAZolam Prudy Feeler(XANAX) 1 MG tablet Take 1 mg by mouth 3 (three) times daily as needed for sleep or anxiety.     Historical Provider, MD  aspirin 81 MG tablet Take 81 mg by mouth daily.    Historical Provider, MD  cyclobenzaprine (FLEXERIL) 10 MG tablet Take 10 mg by mouth 2 (two) times daily as needed for muscle spasms.    Historical Provider, MD  levothyroxine (SYNTHROID, LEVOTHROID) 50 MCG tablet Take 50 mcg by mouth daily.    Historical Provider, MD  lithium carbonate 300 MG capsule Take 300 mg by mouth at bedtime.    Historical Provider, MD  sodium chloride (OCEAN) 0.65 % SOLN nasal spray Place 1 spray into both nostrils as needed for congestion.    Historical Provider, MD   BP 137/82  Pulse 84  Temp(Src) 98.9 F (37.2 C) (Oral)  Resp 20  Ht 5\' 7"  (1.702 m)  Wt 152 lb (68.947 kg)  BMI 23.80 kg/m2  SpO2 98% Physical Exam  Constitutional: She is oriented to person, place, and time. She appears well-developed and well-nourished.  HENT:  Head: Normocephalic.  Cardiovascular: Normal rate.   Pulmonary/Chest: Effort normal.  Musculoskeletal: She exhibits tenderness.  Neurological: She is alert and oriented to person, place, and time. No sensory deficit.  Skin: Laceration noted.  0.5 cm well approximated  laceration which is hemostatic located on her dorsal finger web space between her mom and index finger of left hand.  Distal sensation is intact, full-strength in digits.    ED Course  Procedures (including critical care time)  LACERATION REPAIR Performed by: Burgess Amor Authorized by: Burgess Amor Consent: Verbal consent obtained. Risks and benefits: risks, benefits and alternatives were discussed Consent given by: patient Patient identity confirmed: provided demographic data Prepped and Draped in normal sterile fashion Wound explored  Laceration Location: left hand  Laceration Length: 0.5 cm  No Foreign Bodies  seen or palpated  Anesthesia: na Local anesthetic: na Anesthetic total: na Irrigation method: syringe Amount of cleaning: standard  Skin closure: dermabond  Number of sutures: na, dermabond  Technique: dermabond  Patient tolerance: Patient tolerated the procedure well with no immediate complications.   Labs Review Labs Reviewed - No data to display  Imaging Review No results found.   EKG Interpretation None      MDM   Final diagnoses:  Laceration    Prn f/u.  Wound care instructions given.   Return here or see pcp for any signs of infection including redness, swelling, worse pain or drainage of pus.  Tetanus updated.       Burgess Amor, PA-C 02/21/14 [phone]

## 2014-02-20 NOTE — ED Notes (Signed)
Pt was cutting plastic off tops of spices with a knife and knife slipped out of her hand and went approximately an inch into her other hand. Pt currently has hand bandaged and no blood noted to dsg.

## 2014-02-20 NOTE — Discharge Instructions (Signed)

## 2014-02-22 NOTE — ED Provider Notes (Signed)
Medical screening examination/treatment/procedure(s) were performed by non-physician practitioner and as supervising physician I was immediately available for consultation/collaboration.  Flint MelterElliott L Machaela Caterino, MD 02/22/14 [phone]

## 2014-03-12 ENCOUNTER — Emergency Department (HOSPITAL_COMMUNITY): Payer: Medicare Other

## 2014-03-12 ENCOUNTER — Encounter (HOSPITAL_COMMUNITY): Payer: Self-pay | Admitting: Emergency Medicine

## 2014-03-12 ENCOUNTER — Emergency Department (HOSPITAL_COMMUNITY)
Admission: EM | Admit: 2014-03-12 | Discharge: 2014-03-12 | Disposition: A | Discharge status: Home / Self Care | Payer: Medicare Other | Attending: Emergency Medicine | Admitting: Emergency Medicine

## 2014-03-12 DIAGNOSIS — Z8659 Personal history of other mental and behavioral disorders: Secondary | ICD-10-CM | POA: Diagnosis not present

## 2014-03-12 DIAGNOSIS — S335XXA Sprain of ligaments of lumbar spine, initial encounter: Secondary | ICD-10-CM | POA: Diagnosis not present

## 2014-03-12 DIAGNOSIS — Y929 Unspecified place or not applicable: Secondary | ICD-10-CM | POA: Insufficient documentation

## 2014-03-12 DIAGNOSIS — Z79899 Other long term (current) drug therapy: Secondary | ICD-10-CM | POA: Diagnosis not present

## 2014-03-12 DIAGNOSIS — F319 Bipolar disorder, unspecified: Secondary | ICD-10-CM | POA: Diagnosis not present

## 2014-03-12 DIAGNOSIS — Z87891 Personal history of nicotine dependence: Secondary | ICD-10-CM | POA: Diagnosis not present

## 2014-03-12 DIAGNOSIS — Z7982 Long term (current) use of aspirin: Secondary | ICD-10-CM | POA: Diagnosis not present

## 2014-03-12 DIAGNOSIS — Z880 Allergy status to penicillin: Secondary | ICD-10-CM | POA: Diagnosis not present

## 2014-03-12 DIAGNOSIS — Y9389 Activity, other specified: Secondary | ICD-10-CM | POA: Insufficient documentation

## 2014-03-12 DIAGNOSIS — IMO0002 Reserved for concepts with insufficient information to code with codable children: Secondary | ICD-10-CM | POA: Diagnosis present

## 2014-03-12 DIAGNOSIS — X500XXA Overexertion from strenuous movement or load, initial encounter: Secondary | ICD-10-CM | POA: Insufficient documentation

## 2014-03-12 DIAGNOSIS — S39012A Strain of muscle, fascia and tendon of lower back, initial encounter: Secondary | ICD-10-CM

## 2014-03-12 DIAGNOSIS — J45909 Unspecified asthma, uncomplicated: Secondary | ICD-10-CM | POA: Diagnosis not present

## 2014-03-12 MED ORDER — HYDROMORPHONE HCL PF 1 MG/ML IJ SOLN
1.0000 mg | Freq: Once | INTRAMUSCULAR | Status: AC
  Administered 2014-03-12: 1 mg via INTRAMUSCULAR
  Filled 2014-03-12: qty 1

## 2014-03-12 MED ORDER — HYDROCODONE-ACETAMINOPHEN 5-325 MG PO TABS: 1.0000 | ORAL_TABLET | ORAL | Status: DC | PRN

## 2014-03-12 MED ORDER — NAPROXEN 500 MG PO TABS: 500.0000 mg | ORAL_TABLET | Freq: Two times a day (BID) | ORAL | Status: DC

## 2014-03-12 MED ORDER — CYCLOBENZAPRINE HCL 10 MG PO TABS: 10.0000 mg | ORAL_TABLET | Freq: Two times a day (BID) | ORAL | Status: DC | PRN

## 2014-03-12 NOTE — ED Notes (Signed)
Pt wanting to take her muscle relaxer, instructed pt to wait for EDP to see first, pt states she last took at 1000 and takes every 4 hours

## 2014-03-12 NOTE — ED Notes (Addendum)
Pt presents to the ED complaining of mid lower back pain. Pt states she is unsure what caused injury. Pt states that it hurts to move and take deep breaths. NAD noted in triage. Pt states she took a muscle relaxer and one of her husbands hydrocodones at 0900 this morning.

## 2014-03-12 NOTE — ED Provider Notes (Signed)
CSN: [id number]     Arrival date & time 03/12/14  1338 History   First MD Initiated Contact with Patient 03/12/14 1501     Chief Complaint  Patient presents with  . Back Injury   HPI Patient presents to the emergency room with complaints of sharp lower back pain. The patient told the nurses she is not sure what caused her injury however she told me that she was reaching up stretching for something above her head when she felt a pop in her lower back. Patient had sharp pain in her lower back. It increases with movement. It also increases with breathing however she does not have pain in her chest and is not short of breath. She tried taking some hydrocodone and a muscle relaxant with minimal relief. She has not had any fevers or chills. No numbness or weakness. Past Medical History  Diagnosis Date  . Asthma   . Bipolar 1 disorder   . Schizophrenia    Past Surgical History  Procedure Laterality Date  . Abdominal hysterectomy    . Tonsillectomy    . Knee surgery    . Elbow surgery    . Foot surgery     No family history on file. History  Substance Use Topics  . Smoking status: Former Games developer  . Smokeless tobacco: Not on file  . Alcohol Use: No   OB History   Grav Para Term Preterm Abortions TAB SAB Ect Mult Living                 Review of Systems  All other systems reviewed and are negative.     Allergies  Aminophylline; Sumatriptan; Theophylline; Codeine; Depakote; Divalproex sodium; Lamictal; Lithium; Penicillins; Pentazocine lactate; Risperidone; and Tetracycline  Home Medications   Prior to Admission medications   Medication Sig Start Date End Date Taking? Authorizing Provider  albuterol (PROVENTIL HFA;VENTOLIN HFA) 108 (90 BASE) MCG/ACT inhaler Inhale 2 puffs into the lungs every 6 (six) hours as needed for wheezing.   Yes Historical Provider, MD  ALPRAZolam Prudy Feeler) 1 MG tablet Take 1 mg by mouth 3 (three) times daily as needed for sleep or anxiety.    Yes Historical  Provider, MD  aspirin 81 MG tablet Take 81 mg by mouth daily.   Yes Historical Provider, MD  budesonide-formoterol (SYMBICORT) 160-4.5 MCG/ACT inhaler Inhale 2 puffs into the lungs 2 (two) times daily.   Yes Historical Provider, MD  cyclobenzaprine (FLEXERIL) 10 MG tablet Take 10 mg by mouth 2 (two) times daily as needed for muscle spasms.   Yes Historical Provider, MD  gabapentin (NEURONTIN) 600 MG tablet Take 600 mg by mouth 3 (three) times daily.   Yes Historical Provider, MD  levothyroxine (SYNTHROID, LEVOTHROID) 50 MCG tablet Take 50 mcg by mouth daily.   Yes Historical Provider, MD  lithium carbonate 300 MG capsule Take 300 mg by mouth at bedtime.   Yes Historical Provider, MD  sodium chloride (OCEAN) 0.65 % SOLN nasal spray Place 1 spray into both nostrils as needed for congestion.   Yes Historical Provider, MD  cyclobenzaprine (FLEXERIL) 10 MG tablet Take 1 tablet (10 mg total) by mouth 2 (two) times daily as needed for muscle spasms. 03/12/14   Linwood Dibbles, MD  HYDROcodone-acetaminophen (NORCO/VICODIN) 5-325 MG per tablet Take 1-2 tablets by mouth every 4 (four) hours as needed. 03/12/14   Linwood Dibbles, MD  naproxen (NAPROSYN) 500 MG tablet Take 1 tablet (500 mg total) by mouth 2 (two) times daily. 03/12/14  Jonpaul Lumm, MD   BP 112/70  Pulse 95  Temp(Src) 98.5 F (36.9 C) (Oral)  Resp 18  Ht  (1.702 m)  Wt 150 lb (68.04 kg)  BMI 23.49 kg/m2  SpO2 100% Physical Exam  Nursing note and vitals reviewed. Constitutional: She appears well-developed and well-nourished.  HENT:  Head: Normocephalic and atraumatic.  Right Ear: External ear normal.  Left Ear: External ear normal.  Nose: Nose normal.  Eyes: Conjunctivae and EOM are normal.  Neck: Neck supple. No tracheal deviation present.  Cardiovascular: Normal rate, regular rhythm and normal heart sounds.   Pulmonary/Chest: Effort normal and breath sounds normal. No stridor. No respiratory distress. She has no wheezes. She has no rales. She  exhibits no tenderness.  Musculoskeletal: She exhibits no edema and no tenderness.       Lumbar back: She exhibits decreased range of motion, pain and spasm. She exhibits no swelling and no edema.  Neurological: She is alert. She is not disoriented. No cranial nerve deficit or sensory deficit. She exhibits normal muscle tone. Coordination normal.  Reflex Scores:      Achilles reflexes are 2+ on the right side. 5 plantar strength and dorsiflexion strength bilateral lower extremities  Skin: Skin is warm and dry. No rash noted. She is not diaphoretic. No erythema.  Psychiatric: She has a normal mood and affect. Her behavior is normal. Thought content normal.    ED Course  Procedures (including critical care time)  Imaging Review Dg Lumbar Spine Complete  03/12/2014   CLINICAL DATA:  Low back pain and popping sensation with flexion  EXAM: LUMBAR SPINE - COMPLETE 4+ VIEW  COMPARISON:  None.  FINDINGS: There is no evidence of lumbar spine fracture. Alignment is normal. Intervertebral disc spaces are maintained.  IMPRESSION: Negative.   Electronically Signed   By: Christiana Pellant M.D.   On: 03/12/2014 15:34     MDM   Final diagnoses:  Lumbar strain, initial encounter    Pt complains of sharp pain that increases with breathing however the pain is in the lower back.  Lungs CTA.  Doubt PE, PE, PTX.  Will dc home with pain Linwood Dibbles Follow up with a pCP    Linwood Dibbles, MD 03/12/14 [phone]

## 2014-03-12 NOTE — Discharge Instructions (Signed)
Back Pain, Adult °Back pain is very common. The pain often gets better over time. The cause of back pain is usually not dangerous. Most people can learn to manage their back pain on their own.  °HOME CARE  °· Stay active. Start with short walks on flat ground if you can. Try to walk farther each day. °· Do not sit, drive, or stand in one place for more than 30 minutes. Do not stay in bed. °· Do not avoid exercise or work. Activity can help your back heal faster. °· Be careful when you bend or lift an object. Bend at your knees, keep the object close to you, and do not twist. °· Sleep on a firm mattress. Lie on your side, and bend your knees. If you lie on your back, put a pillow under your knees. °· Only take medicines as told by your doctor. °· Put ice on the injured area. °¨ Put ice in a plastic bag. °¨ Place a towel between your skin and the bag. °¨ Leave the ice on for 15-20 minutes, 03-04 times a day for the first 2 to 3 days. After that, you can switch between ice and heat packs. °· Ask your doctor about back exercises or massage. °· Avoid feeling anxious or stressed. Find good ways to deal with stress, such as exercise. °GET HELP RIGHT AWAY IF:  °· Your pain does not go away with rest or medicine. °· Your pain does not go away in 1 week. °· You have new problems. °· You do not feel well. °· The pain spreads into your legs. °· You cannot control when you poop (bowel movement) or pee (urinate). °· Your arms or legs feel weak or lose feeling (numbness). °· You feel sick to your stomach (nauseous) or throw up (vomit). °· You have belly (abdominal) pain. °· You feel like you may pass out (faint). °MAKE SURE YOU:  °· Understand these instructions. °· Will watch your condition. °· Will get help right away if you are not doing well or get worse. °Document Released: 12/09/2007 Document Revised: 09/14/2011 Document Reviewed: 10/24/2013 °Exit[employer]® Patient Information ©2015 [employer]. This information is not intended  to replace advice given to you by your health care provider. Make sure you discuss any questions you have with your health care provider. ° °

## 2014-03-25 ENCOUNTER — Emergency Department (HOSPITAL_COMMUNITY)
Admission: EM | Admit: 2014-03-25 | Discharge: 2014-03-25 | Disposition: A | Discharge status: Home / Self Care | Payer: PRIVATE HEALTH INSURANCE | Attending: Emergency Medicine | Admitting: Emergency Medicine

## 2014-03-25 ENCOUNTER — Encounter (HOSPITAL_COMMUNITY): Payer: Self-pay | Admitting: Emergency Medicine

## 2014-03-25 ENCOUNTER — Emergency Department (HOSPITAL_COMMUNITY): Payer: PRIVATE HEALTH INSURANCE

## 2014-03-25 DIAGNOSIS — Z7982 Long term (current) use of aspirin: Secondary | ICD-10-CM | POA: Diagnosis not present

## 2014-03-25 DIAGNOSIS — F209 Schizophrenia, unspecified: Secondary | ICD-10-CM | POA: Insufficient documentation

## 2014-03-25 DIAGNOSIS — Z79899 Other long term (current) drug therapy: Secondary | ICD-10-CM | POA: Insufficient documentation

## 2014-03-25 DIAGNOSIS — Z87891 Personal history of nicotine dependence: Secondary | ICD-10-CM | POA: Diagnosis not present

## 2014-03-25 DIAGNOSIS — F319 Bipolar disorder, unspecified: Secondary | ICD-10-CM | POA: Diagnosis not present

## 2014-03-25 DIAGNOSIS — J3489 Other specified disorders of nose and nasal sinuses: Secondary | ICD-10-CM | POA: Insufficient documentation

## 2014-03-25 DIAGNOSIS — Z880 Allergy status to penicillin: Secondary | ICD-10-CM | POA: Diagnosis not present

## 2014-03-25 DIAGNOSIS — J45901 Unspecified asthma with (acute) exacerbation: Secondary | ICD-10-CM | POA: Diagnosis not present

## 2014-03-25 DIAGNOSIS — R0602 Shortness of breath: Secondary | ICD-10-CM | POA: Insufficient documentation

## 2014-03-25 DIAGNOSIS — J40 Bronchitis, not specified as acute or chronic: Secondary | ICD-10-CM

## 2014-03-25 MED ORDER — AZITHROMYCIN 250 MG PO TABS: ORAL_TABLET | ORAL | Status: DC

## 2014-03-25 MED ORDER — BUDESONIDE-FORMOTEROL FUMARATE 160-4.5 MCG/ACT IN AERO: 2.0000 | INHALATION_SPRAY | Freq: Two times a day (BID) | RESPIRATORY_TRACT | Status: DC

## 2014-03-25 MED ORDER — PREDNISONE 50 MG PO TABS
60.0000 mg | ORAL_TABLET | Freq: Once | ORAL | Status: AC
  Administered 2014-03-25: 60 mg via ORAL
  Filled 2014-03-25 (×2): qty 1

## 2014-03-25 MED ORDER — IPRATROPIUM-ALBUTEROL 0.5-2.5 (3) MG/3ML IN SOLN
3.0000 mL | Freq: Once | RESPIRATORY_TRACT | Status: AC
  Administered 2014-03-25: 3 mL via RESPIRATORY_TRACT
  Filled 2014-03-25: qty 3

## 2014-03-25 MED ORDER — IPRATROPIUM BROMIDE 0.02 % IN SOLN
0.5000 mg | Freq: Once | RESPIRATORY_TRACT | Status: DC
  Filled 2014-03-25: qty 2.5

## 2014-03-25 MED ORDER — ALBUTEROL SULFATE (2.5 MG/3ML) 0.083% IN NEBU: 5.0000 mg | INHALATION_SOLUTION | Freq: Once | RESPIRATORY_TRACT | Status: DC

## 2014-03-25 MED ORDER — ALBUTEROL SULFATE HFA 108 (90 BASE) MCG/ACT IN AERS
INHALATION_SPRAY | RESPIRATORY_TRACT | Status: AC
  Filled 2014-03-25: qty 6.7

## 2014-03-25 MED ORDER — ALBUTEROL SULFATE HFA 108 (90 BASE) MCG/ACT IN AERS
2.0000 | INHALATION_SPRAY | RESPIRATORY_TRACT | Status: DC
  Administered 2014-03-25: 2 via RESPIRATORY_TRACT

## 2014-03-25 MED ORDER — ALBUTEROL SULFATE (2.5 MG/3ML) 0.083% IN NEBU
2.5000 mg | INHALATION_SOLUTION | Freq: Once | RESPIRATORY_TRACT | Status: AC
Start: 2014-03-25 — End: 2014-03-25
  Administered 2014-03-25: 2.5 mg via RESPIRATORY_TRACT
  Filled 2014-03-25: qty 3

## 2014-03-25 MED ORDER — PREDNISONE 10 MG PO TABS: 20.0000 mg | ORAL_TABLET | Freq: Every day | ORAL | Status: DC

## 2014-03-25 NOTE — Discharge Instructions (Signed)

## 2014-03-25 NOTE — ED Notes (Addendum)
Cough congestion x 1 week.  Denies n/v/d/, fever, chills.  Has increasing SOB.

## 2014-03-25 NOTE — ED Provider Notes (Signed)
CSN: [id number]     Arrival date & time 03/25/14  1823 History   First MD Initiated Contact with Patient 03/25/14 1833   This chart was scribed for Toy Baker, MD by Gwenevere Abbot, ED scribe. This patient was seen in room APA15/APA15 and the patient's care was started at 6:45 PM.    Chief Complaint  Patient presents with  . Shortness of Breath   The history is provided by the patient. No language interpreter was used.  HPI Comments:  Kristen Ryan is a 58 y.o. female who presents to the Emergency Department complaining of a cough, with associated symptoms of congestion, onset one week ago. Pt reports that the cough is productive. Pt reports that she does experience chest tightness with cough. Pt reports that she has taken coricidin for symptoms, without relief. Pt denies nausea, vomiting, or diarrhea. Pt denies fever or chills.  Past Medical History  Diagnosis Date  . Asthma   . Bipolar 1 disorder   . Schizophrenia    Past Surgical History  Procedure Laterality Date  . Abdominal hysterectomy    . Tonsillectomy    . Knee surgery    . Elbow surgery    . Foot surgery    . Cesarean section     History reviewed. No pertinent family history. History  Substance Use Topics  . Smoking status: Former Games developer  . Smokeless tobacco: Not on file  . Alcohol Use: No   OB History   Grav Para Term Preterm Abortions TAB SAB Ect Mult Living                 Review of Systems  HENT: Positive for congestion.   Respiratory: Positive for cough and shortness of breath.   All other systems reviewed and are negative.   Allergies  Aminophylline; Sumatriptan; Theophylline; Codeine; Depakote; Divalproex sodium; Lamictal; Lithium; Penicillins; Pentazocine lactate; Risperidone; and Tetracycline  Home Medications   Prior to Admission medications   Medication Sig Start Date End Date Taking? Authorizing Provider  albuterol (PROVENTIL HFA;VENTOLIN HFA) 108 (90 BASE) MCG/ACT inhaler Inhale 2 puffs  into the lungs every 6 (six) hours as needed for wheezing.    Historical Provider, MD  ALPRAZolam Prudy Feeler) 1 MG tablet Take 1 mg by mouth 3 (three) times daily as needed for sleep or anxiety.     Historical Provider, MD  aspirin 81 MG tablet Take 81 mg by mouth daily.    Historical Provider, MD  budesonide-formoterol (SYMBICORT) 160-4.5 MCG/ACT inhaler Inhale 2 puffs into the lungs 2 (two) times daily.    Historical Provider, MD  cyclobenzaprine (FLEXERIL) 10 MG tablet Take 10 mg by mouth 2 (two) times daily as needed for muscle spasms.    Historical Provider, MD  cyclobenzaprine (FLEXERIL) 10 MG tablet Take 1 tablet (10 mg total) by mouth 2 (two) times daily as needed for muscle spasms. 03/12/14   Linwood Dibbles, MD  gabapentin (NEURONTIN) 600 MG tablet Take 600 mg by mouth 3 (three) times daily.    Historical Provider, MD  HYDROcodone-acetaminophen (NORCO/VICODIN) 5-325 MG per tablet Take 1-2 tablets by mouth every 4 (four) hours as needed. 03/12/14   Linwood Dibbles, MD  levothyroxine (SYNTHROID, LEVOTHROID) 50 MCG tablet Take 50 mcg by mouth daily.    Historical Provider, MD  lithium carbonate 300 MG capsule Take 300 mg by mouth at bedtime.    Historical Provider, MD  naproxen (NAPROSYN) 500 MG tablet Take 1 tablet (500 mg total) by mouth 2 (two)  times daily. 03/12/14   Linwood Dibbles, MD  sodium chloride (OCEAN) 0.65 % SOLN nasal spray Place 1 spray into both nostrils as needed for congestion.    Historical Provider, MD   BP 140/105  Pulse 98  Temp(Src) 98.9 F (37.2 C) (Oral)  Resp 18  Ht  (1.702 m)  Wt 150 lb (68.04 kg)  BMI 23.49 kg/m2  SpO2 98% Physical Exam  Nursing note and vitals reviewed. Constitutional: She is oriented to person, place, and time. She appears well-developed and well-nourished.  Non-toxic appearance. No distress.  HENT:  Head: Normocephalic and atraumatic.  Eyes: Conjunctivae, EOM and lids are normal. Pupils are equal, round, and reactive to light.  Neck: Normal range of  motion. Neck supple. No tracheal deviation present. No mass present.  Cardiovascular: Normal rate, regular rhythm and normal heart sounds.  Exam reveals no gallop.   No murmur heard. Pulmonary/Chest: Effort normal. No stridor. No respiratory distress. She has no decreased breath sounds. She has wheezes (Expiratory wheezes bilaterally.). She has no rhonchi. She has no rales.  Abdominal: Soft. Normal appearance and bowel sounds are normal. She exhibits no distension. There is no tenderness. There is no rebound and no CVA tenderness.  Musculoskeletal: Normal range of motion. She exhibits no edema and no tenderness.  Neurological: She is alert and oriented to person, place, and time. She has normal strength. No cranial nerve deficit or sensory deficit. GCS eye subscore is 4. GCS verbal subscore is 5. GCS motor subscore is 6.  Skin: Skin is warm and dry. No abrasion and no rash noted.  Psychiatric: She has a normal mood and affect. Her speech is normal and behavior is normal.    ED Course  Procedures  DIAGNOSTIC STUDIES: Oxygen Saturation is 98% on RA, normal by my interpretation.  COORDINATION OF CARE: 6:49 PM-Discussed treatment plan which includes  (CXR, CBC panel, UA) with pt at bedside and pt agreed to plan.  Labs Review Labs Reviewed - No data to display  Imaging Review No results found.   EKG Interpretation None      MDM   Final diagnoses:  None    I personally performed the services described in this documentation, which was scribed in my presence. The recorded information has been reviewed and is accurate.  Patient given albuterol with Atrovent feels better. Also given a prednisone here. Suspect that she has bronchitis we'll treat for that.    Toy Baker, MD 03/25/14 Corky [facility]s

## 2014-03-25 NOTE — ED Notes (Signed)
Patient with no complaints at this time. Respirations even and unlabored. Skin warm/dry. Discharge instructions reviewed with patient at this time. Patient given opportunity to voice concerns/ask questions. Patient discharged at this time and left Emergency Department with steady gait.   

## 2014-03-28 ENCOUNTER — Encounter (HOSPITAL_COMMUNITY): Payer: Self-pay | Admitting: Emergency Medicine

## 2014-03-28 ENCOUNTER — Emergency Department (HOSPITAL_COMMUNITY)
Admission: EM | Admit: 2014-03-28 | Discharge: 2014-03-28 | Disposition: A | Discharge status: Home / Self Care | Payer: Medicare Other | Attending: Emergency Medicine | Admitting: Emergency Medicine

## 2014-03-28 DIAGNOSIS — F209 Schizophrenia, unspecified: Secondary | ICD-10-CM | POA: Insufficient documentation

## 2014-03-28 DIAGNOSIS — R05 Cough: Secondary | ICD-10-CM | POA: Diagnosis present

## 2014-03-28 DIAGNOSIS — Z880 Allergy status to penicillin: Secondary | ICD-10-CM | POA: Diagnosis not present

## 2014-03-28 DIAGNOSIS — J45909 Unspecified asthma, uncomplicated: Secondary | ICD-10-CM | POA: Diagnosis not present

## 2014-03-28 DIAGNOSIS — R059 Cough, unspecified: Secondary | ICD-10-CM | POA: Insufficient documentation

## 2014-03-28 DIAGNOSIS — Z87891 Personal history of nicotine dependence: Secondary | ICD-10-CM | POA: Diagnosis not present

## 2014-03-28 DIAGNOSIS — IMO0002 Reserved for concepts with insufficient information to code with codable children: Secondary | ICD-10-CM | POA: Diagnosis not present

## 2014-03-28 DIAGNOSIS — Z79899 Other long term (current) drug therapy: Secondary | ICD-10-CM | POA: Diagnosis not present

## 2014-03-28 DIAGNOSIS — F319 Bipolar disorder, unspecified: Secondary | ICD-10-CM | POA: Insufficient documentation

## 2014-03-28 DIAGNOSIS — Z7982 Long term (current) use of aspirin: Secondary | ICD-10-CM | POA: Insufficient documentation

## 2014-03-28 DIAGNOSIS — J209 Acute bronchitis, unspecified: Secondary | ICD-10-CM

## 2014-03-28 MED ORDER — ALBUTEROL SULFATE (2.5 MG/3ML) 0.083% IN NEBU: 5.0000 mg | INHALATION_SOLUTION | Freq: Once | RESPIRATORY_TRACT | Status: DC

## 2014-03-28 MED ORDER — IPRATROPIUM BROMIDE 0.02 % IN SOLN: 0.5000 mg | Freq: Once | RESPIRATORY_TRACT | Status: DC

## 2014-03-28 MED ORDER — IPRATROPIUM-ALBUTEROL 0.5-2.5 (3) MG/3ML IN SOLN
3.0000 mL | Freq: Once | RESPIRATORY_TRACT | Status: AC
  Administered 2014-03-28: 3 mL via RESPIRATORY_TRACT
  Filled 2014-03-28: qty 3

## 2014-03-28 MED ORDER — ALBUTEROL SULFATE (2.5 MG/3ML) 0.083% IN NEBU
2.5000 mg | INHALATION_SOLUTION | Freq: Once | RESPIRATORY_TRACT | Status: AC
  Administered 2014-03-28: 2.5 mg via RESPIRATORY_TRACT
  Filled 2014-03-28: qty 3

## 2014-03-28 NOTE — ED Notes (Signed)
MD at bedside. 
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
## 2014-03-28 NOTE — Discharge Instructions (Signed)
Albuterol neb treatment: One every 4 hours as needed for wheezing.  Continue Zithromax as before.  Return to the emergency department for severe chest pain, difficulty breathing, or other new and concerning symptoms.   Acute Bronchitis Bronchitis is inflammation of the airways that extend from the windpipe into the lungs (bronchi). The inflammation often causes mucus to develop. This leads to a cough, which is the most common symptom of bronchitis.  In acute bronchitis, the condition usually develops suddenly and goes away over time, usually in a couple weeks. Smoking, allergies, and asthma can make bronchitis worse. Repeated episodes of bronchitis may cause further lung problems.  CAUSES Acute bronchitis is most often caused by the same virus that causes a cold. The virus can spread from person to person (contagious) through coughing, sneezing, and touching contaminated objects. SIGNS AND SYMPTOMS   Cough.   Fever.   Coughing up mucus.   Body aches.   Chest congestion.   Chills.   Shortness of breath.   Sore throat.  DIAGNOSIS  Acute bronchitis is usually diagnosed through a physical exam. Your health care provider will also ask you questions about your medical history. Tests, such as chest X-rays, are sometimes done to rule out other conditions.  TREATMENT  Acute bronchitis usually goes away in a couple weeks. Oftentimes, no medical treatment is necessary. Medicines are sometimes given for relief of fever or cough. Antibiotic medicines are usually not needed but may be prescribed in certain situations. In some cases, an inhaler may be recommended to help reduce shortness of breath and control the cough. A cool mist vaporizer may also be used to help thin bronchial secretions and make it easier to clear the chest.  HOME CARE INSTRUCTIONS  Get plenty of rest.   Drink enough fluids to keep your urine clear or pale yellow (unless you have a medical condition that requires  fluid restriction). Increasing fluids may help thin your respiratory secretions (sputum) and reduce chest congestion, and it will prevent dehydration.   Take medicines only as directed by your health care provider.  If you were prescribed an antibiotic medicine, finish it all even if you start to feel better.  Avoid smoking and secondhand smoke. Exposure to cigarette smoke or irritating chemicals will make bronchitis worse. If you are a smoker, consider using nicotine gum or skin patches to help control withdrawal symptoms. Quitting smoking will help your lungs heal faster.   Reduce the chances of another bout of acute bronchitis by washing your hands frequently, avoiding people with cold symptoms, and trying not to touch your hands to your mouth, nose, or eyes.   Keep all follow-up visits as directed by your health care provider.  SEEK MEDICAL CARE IF: Your symptoms do not improve after 1 week of treatment.  SEEK IMMEDIATE MEDICAL CARE IF:  You develop an increased fever or chills.   You have chest pain.   You have severe shortness of breath.  You have bloody sputum.   You develop dehydration.  You faint or repeatedly feel like you are going to pass out.  You develop repeated vomiting.  You develop a severe headache. MAKE SURE YOU:   Understand these instructions.  Will watch your condition.  Will get help right away if you are not doing well or get worse. Document Released: 07/30/2004 Document Revised: 11/06/2013 Document Reviewed: 12/13/2012 [facility] Patient Information 2015 [place], [place]. This information is not intended to replace advice given to you by your health care provider. Make  sure you discuss any questions you have with your health care provider.

## 2014-03-28 NOTE — ED Notes (Signed)
Pt c/o cough was recently diagnosed with URI and was told to come back if not feeling better.

## 2014-04-03 ENCOUNTER — Encounter (HOSPITAL_COMMUNITY): Payer: Self-pay | Admitting: Emergency Medicine

## 2014-04-03 ENCOUNTER — Emergency Department (HOSPITAL_COMMUNITY): Payer: PRIVATE HEALTH INSURANCE

## 2014-04-03 ENCOUNTER — Emergency Department (HOSPITAL_COMMUNITY)
Admission: EM | Admit: 2014-04-03 | Discharge: 2014-04-04 | Disposition: A | Discharge status: Home / Self Care | Payer: PRIVATE HEALTH INSURANCE | Attending: Emergency Medicine | Admitting: Emergency Medicine

## 2014-04-03 DIAGNOSIS — IMO0002 Reserved for concepts with insufficient information to code with codable children: Secondary | ICD-10-CM | POA: Insufficient documentation

## 2014-04-03 DIAGNOSIS — J45909 Unspecified asthma, uncomplicated: Secondary | ICD-10-CM | POA: Insufficient documentation

## 2014-04-03 DIAGNOSIS — Z791 Long term (current) use of non-steroidal anti-inflammatories (NSAID): Secondary | ICD-10-CM | POA: Diagnosis not present

## 2014-04-03 DIAGNOSIS — F319 Bipolar disorder, unspecified: Secondary | ICD-10-CM | POA: Diagnosis not present

## 2014-04-03 DIAGNOSIS — Z79899 Other long term (current) drug therapy: Secondary | ICD-10-CM | POA: Insufficient documentation

## 2014-04-03 DIAGNOSIS — Z7982 Long term (current) use of aspirin: Secondary | ICD-10-CM | POA: Diagnosis not present

## 2014-04-03 DIAGNOSIS — R079 Chest pain, unspecified: Secondary | ICD-10-CM | POA: Insufficient documentation

## 2014-04-03 DIAGNOSIS — Z880 Allergy status to penicillin: Secondary | ICD-10-CM | POA: Diagnosis not present

## 2014-04-03 DIAGNOSIS — Z8659 Personal history of other mental and behavioral disorders: Secondary | ICD-10-CM | POA: Insufficient documentation

## 2014-04-03 LAB — CBC
HEMATOCRIT: 36.8 % (ref 36.0–46.0)
Hemoglobin: 11.7 g/dL — ABNORMAL LOW (ref 12.0–15.0)
MCH: 27.8 pg (ref 26.0–34.0)
MCHC: 31.8 g/dL (ref 30.0–36.0)
MCV: 87.4 fL (ref 78.0–100.0)
Platelets: 215 10*3/uL (ref 150–400)
RBC: 4.21 MIL/uL (ref 3.87–5.11)
RDW: 14.9 % (ref 11.5–15.5)
WBC: 9.9 10*3/uL (ref 4.0–10.5)

## 2014-04-03 LAB — BASIC METABOLIC PANEL
Anion gap: 10 (ref 5–15)
BUN: 20 mg/dL (ref 6–23)
CALCIUM: 9.6 mg/dL (ref 8.4–10.5)
CHLORIDE: 99 meq/L (ref 96–112)
CO2: 30 mEq/L (ref 19–32)
Creatinine, Ser: 1.18 mg/dL — ABNORMAL HIGH (ref 0.50–1.10)
GFR calc Af Amer: 58 mL/min — ABNORMAL LOW (ref >90)
GFR calc non Af Amer: 50 mL/min — ABNORMAL LOW (ref >90)
GLUCOSE: 120 mg/dL — AB (ref 70–99)
Potassium: 4.3 mEq/L (ref 3.7–5.3)
Sodium: 139 mEq/L (ref 137–147)

## 2014-04-03 LAB — TROPONIN I: Troponin I: <0.3 ng/mL (ref <0.30)

## 2014-04-03 LAB — D-DIMER, QUANTITATIVE: D-Dimer, Quant: 1.05 ug/mL-FEU — ABNORMAL HIGH (ref 0.00–0.48)

## 2014-04-03 MED ORDER — KETOROLAC TROMETHAMINE 30 MG/ML IJ SOLN
30.0000 mg | Freq: Once | INTRAMUSCULAR | Status: AC
  Administered 2014-04-04: 30 mg via INTRAVENOUS
  Filled 2014-04-03: qty 1

## 2014-04-03 MED ORDER — MORPHINE SULFATE 4 MG/ML IJ SOLN
4.0000 mg | Freq: Once | INTRAMUSCULAR | Status: AC
  Administered 2014-04-04: 4 mg via INTRAVENOUS
  Filled 2014-04-03: qty 1

## 2014-04-03 MED ORDER — ASPIRIN 325 MG PO TABS
325.0000 mg | ORAL_TABLET | Freq: Once | ORAL | Status: AC
  Administered 2014-04-04: 325 mg via ORAL
  Filled 2014-04-03: qty 1

## 2014-04-03 NOTE — ED Provider Notes (Signed)
CSN: [id number]     Arrival date & time 04/03/14  2113 History  This chart was scribed for Lyanne Co, MD, by Yevette Edwards, ED Scribe. This patient was seen in room APA07/AP[facility]07 and the patient's care was started at 11:29 PM.   None    Chief Complaint  Patient presents with  . Chest Pain    The history is provided by the patient. No language interpreter was used.   HPI Comments: Kristen Ryan is a 58 y.o. female, with a h/o asthma, who presents to the Emergency Department complaining of stabbing, central chest pain which began approximately three hours ago and which radiates under her left breast and is sharp and pleuritic. The pain has been constant, though it is gradually improving at bedside. The pain improves when she is in the sitting position. She reports baseline chest discomfort with deep inspiration, but states tonight's pain was markedly more severe. The pt endorses nausea and SOB associated with the pain.  She denies emesis, diaphoresis, or a fever. She used Tums without resolution.  She reports a cough for three weeks; she was treated in the ED for bronchitis; she was treated with a Z-pack and prednisone. She denies a h/o blood clots.  She voices a h/o HTN and high cholesterol though she does have medication for it. The pt is a former smoker; she smoked an average of a pack a day for 25 years with a maximum of 4 packs a day.   Past Medical History  Diagnosis Date  . Asthma   . Bipolar 1 disorder   . Schizophrenia    Past Surgical History  Procedure Laterality Date  . Abdominal hysterectomy    . Tonsillectomy    . Knee surgery    . Elbow surgery    . Foot surgery    . Cesarean section     History reviewed. No pertinent family history. History  Substance Use Topics  . Smoking status: Former Games developer  . Smokeless tobacco: Not on file  . Alcohol Use: No   No OB history provided.  Review of Systems  A complete 10 system review of systems was obtained, and all  systems were negative except where indicated in the HPI and PE.    Allergies  Aminophylline; Sumatriptan; Theophylline; Codeine; Depakote; Divalproex sodium; Doxycycline; Lamictal; Penicillins; Pentazocine lactate; Risperidone; Tetracyclines & related; Zyprexa; and Tetracycline  Home Medications   Prior to Admission medications   Medication Sig Start Date End Date Taking? Authorizing Provider  albuterol (PROVENTIL HFA;VENTOLIN HFA) 108 (90 BASE) MCG/ACT inhaler Inhale 2 puffs into the lungs every 6 (six) hours as needed for wheezing.   Yes Historical Provider, MD  ALPRAZolam Prudy Feeler) 1 MG tablet Take 1 mg by mouth 3 (three) times daily as needed for sleep or anxiety.    Yes Historical Provider, MD  aspirin EC 81 MG tablet Take 81 mg by mouth daily.   Yes Historical Provider, MD  budesonide-formoterol (SYMBICORT) 160-4.5 MCG/ACT inhaler Inhale 2 puffs into the lungs 2 (two) times daily. 03/25/14  Yes Toy Baker, MD  cyclobenzaprine (FLEXERIL) 10 MG tablet Take 1 tablet (10 mg total) by mouth 2 (two) times daily as needed for muscle spasms. 03/12/14  Yes Linwood Dibbles, MD  gabapentin (NEURONTIN) 600 MG tablet Take 600 mg by mouth 3 (three) times daily.   Yes Historical Provider, MD  levothyroxine (SYNTHROID, LEVOTHROID) 50 MCG tablet Take 50 mcg by mouth daily.   Yes Historical Provider, MD  lithium  carbonate 300 MG capsule Take 300 mg by mouth at bedtime.   Yes Historical Provider, MD  loratadine (ALLERGY RELIEF) 10 MG tablet Take 1 mg by mouth daily.   Yes Historical Provider, MD  naproxen (NAPROSYN) 500 MG tablet Take 1 tablet (500 mg total) by mouth 2 (two) times daily. 03/12/14  Yes Linwood DibblesJon Knapp, MD  sodium chloride (OCEAN) 0.65 % SOLN nasal spray Place 1 spray into both nostrils as needed for congestion.   Yes Historical Provider, MD  azithromycin (ZITHROMAX) 250 MG tablet Take 250-500 mg by mouth See admin instructions. 2 by mouth daily x1 then 1 by mouth daily x4 days 03/25/14   Toy BakerAnthony T Allen, MD   predniSONE (DELTASONE) 10 MG tablet Take 2 tablets (20 mg total) by mouth daily. 03/25/14   Toy BakerAnthony T Allen, MD   Triage Vitals: BP 94/64  Pulse 80  Resp 16  SpO2 93%  Physical Exam  Nursing note and vitals reviewed. Constitutional: She is oriented to person, place, and time. She appears well-developed and well-nourished.  HENT:  Head: Normocephalic.  Eyes: EOM are normal.  Neck: Normal range of motion.  Cardiovascular: Normal rate, regular rhythm and normal heart sounds.   No murmur heard. Pulmonary/Chest: Effort normal. No respiratory distress. She has no wheezes. She has no rales.  Nnotender to left chest.   Abdominal: She exhibits no distension.  Musculoskeletal: Normal range of motion.  Neurological: She is alert and oriented to person, place, and time.  Psychiatric: She has a normal mood and affect.    ED Course  Procedures (including critical care time)  DIAGNOSTIC STUDIES: Oxygen Saturation is 93% on room air, normal by my interpretation.    COORDINATION OF CARE:  11:42 PM- Discussed treatment plan with patient, and the patient agreed to the plan. The plan includes lab work and pain medication.   Results for orders placed during the hospital encounter of 04/03/14  CBC      Result Value Ref Range   WBC 9.9  4.0 - 10.5 K/uL   RBC 4.21  3.87 - 5.11 MIL/uL   Hemoglobin 11.7 (*) 12.0 - 15.0 g/dL   HCT 16.136.8  09.636.0 - 04.546.0 %   MCV 87.4  78.0 - 100.0 fL   MCH 27.8  26.0 - 34.0 pg   MCHC 31.8  30.0 - 36.0 g/dL   RDW 40.914.9  81.111.5 - 91.415.5 %   Platelets 215  150 - 400 K/uL  BASIC METABOLIC PANEL      Result Value Ref Range   Sodium 139  137 - 147 mEq/L   Potassium 4.3  3.7 - 5.3 mEq/L   Chloride 99  96 - 112 mEq/L   CO2 30  19 - 32 mEq/L   Glucose, Bld 120 (*) 70 - 99 mg/dL   BUN 20  6 - 23 mg/dL   Creatinine, Ser 7.821.18 (*) 0.50 - 1.10 mg/dL   Calcium 9.6  8.4 - 95.610.5 mg/dL   GFR calc non Af Amer 50 (*) >90 mL/min   GFR calc Af Amer 58 (*) >90 mL/min   Anion gap 10  5  - 15  TROPONIN I      Result Value Ref Range   Troponin I <0.30  <0.30 ng/mL  D-DIMER, QUANTITATIVE      Result Value Ref Range   D-Dimer, Quant 1.05 (*) 0.00 - 0.48 ug/mL-FEU  TROPONIN I      Result Value Ref Range   Troponin I <0.30  <0.30  ng/mL     Ct Angio Chest W/cm &/or Wo Cm  04/04/2014   CLINICAL DATA:  Left pleuritic chest pain, possible left lower lobe infiltrate, elevated D-dimer.  EXAM: CT ANGIOGRAPHY CHEST WITH CONTRAST  TECHNIQUE: Multidetector CT imaging of the chest was performed using the standard protocol during bolus administration of intravenous contrast. Multiplanar CT image reconstructions and MIPs were obtained to evaluate the vascular anatomy.  CONTRAST:  OMNIPAQUE IOHEXOL 350 MG/ML SOLN  COMPARISON:  Chest radiographs dated 04/03/2014  FINDINGS: No evidence of pulmonary embolism.  Scarring/volume loss at the left lung base, accounting for the radiographic abnormality, chronic when compared to prior CT abdomen pelvis. No focal consolidation. No suspicious pulmonary nodules. No pleural effusion or pneumothorax.  Visualized thyroid is unremarkable.  Heart is normal in size. No pericardial effusion. Mild leftward mediastinal shift with prominent epicardial fat.  No suspicious mediastinal, hilar, or axillary lymphadenopathy.  Visualized upper abdomen is unremarkable.  Mild anterior wedging of the T7 vertebral body (sagittal image 46).  Review of the MIP images confirms the above findings.  IMPRESSION: No evidence of pulmonary embolism.  Scarring/volume loss at the lateral left lung base, accounting for the radiographic abnormality, chronic.  No evidence of acute cardiopulmonary disease.   Electronically Signed   By: Charline Bills M.D.   On: 04/04/2014 01:50   Dg Chest Port 1 View  04/03/2014   CLINICAL DATA:  Chest pain  EXAM: PORTABLE CHEST - 1 VIEW  COMPARISON:  Chest radiograph 03/25/2014  FINDINGS: Cardiac leads project of the chest. Lung volumes are slightly low.  There is a small focal opacity at the lateral left lung base. Otherwise, the lung fields are clear. The heart and mediastinal contours are within normal limits. No acute osseous abnormality.  IMPRESSION: Focal opacity lateral left lung base. This could reflect focal atelectasis, scarring, or a small focus of airspace disease.   Electronically Signed   By: Britta Mccreedy M.D.   On: 04/03/2014 22:56    ECG interpretation   Date: 04/04/2014  Rate: 71  Rhythm: normal sinus rhythm  QRS Axis: normal  Intervals: normal  ST/T Wave abnormalities: Nonspecific ST changes likely early repolarization without reciprocal depression  Conduction Disutrbances: none  Narrative Interpretation:   Old EKG Reviewed: no prior ecg     MDM   Final diagnoses:  None    EKG demonstrates nonspecific ST changes without reciprocal depression.  My suspicion for ACS is low.  Troponin x2 is negative.  Elevated d-dimer.  CT angiography chest demonstrates no evidence of pulmonary embolism.  Some of this may be GI related.  Patient be placed on Prilosec.  She may benefit from outpatient cardiology followup and possible stress testing.  Discharge home in good condition.  She is pain-free.  Understands to return to ER for new or worsening symptoms  I personally performed the services described in this documentation, which was scribed in my presence. The recorded information has been reviewed and is accurate.      Lyanne Co, MD 04/04/14 [phone]

## 2014-04-03 NOTE — ED Notes (Signed)
EKG completed at this time and shown to EDP.  No orders

## 2014-04-03 NOTE — ED Notes (Signed)
Patient states "stabbing" chest pain that started 50 minutes prior to arrival. Patient states she was treated for upper respiratory infection last week.

## 2014-04-04 ENCOUNTER — Emergency Department (HOSPITAL_COMMUNITY): Payer: PRIVATE HEALTH INSURANCE

## 2014-04-04 DIAGNOSIS — R079 Chest pain, unspecified: Secondary | ICD-10-CM | POA: Diagnosis not present

## 2014-04-04 LAB — TROPONIN I: Troponin I: <0.3 ng/mL (ref <0.30)

## 2014-04-04 MED ORDER — HYDROGEN PEROXIDE 3 % EX SOLN
CUTANEOUS | Status: AC
  Administered 2014-04-04: 01:00:00
  Filled 2014-04-04: qty 473

## 2014-04-04 MED ORDER — OMEPRAZOLE 20 MG PO CPDR: 20.0000 mg | DELAYED_RELEASE_CAPSULE | Freq: Every day | ORAL | Status: DC

## 2014-04-04 MED ORDER — IOHEXOL 350 MG/ML SOLN
100.0000 mL | Freq: Once | INTRAVENOUS | Status: AC | PRN
  Administered 2014-04-04: 100 mL via INTRAVENOUS

## 2014-04-04 NOTE — Discharge Instructions (Signed)

## 2014-04-13 ENCOUNTER — Encounter (HOSPITAL_COMMUNITY): Payer: Self-pay | Admitting: Emergency Medicine

## 2014-04-13 ENCOUNTER — Emergency Department (HOSPITAL_COMMUNITY): Payer: PRIVATE HEALTH INSURANCE

## 2014-04-13 ENCOUNTER — Emergency Department (HOSPITAL_COMMUNITY)
Admission: EM | Admit: 2014-04-13 | Discharge: 2014-04-13 | Disposition: A | Discharge status: Home / Self Care | Payer: PRIVATE HEALTH INSURANCE | Attending: Emergency Medicine | Admitting: Emergency Medicine

## 2014-04-13 DIAGNOSIS — Z7982 Long term (current) use of aspirin: Secondary | ICD-10-CM | POA: Diagnosis not present

## 2014-04-13 DIAGNOSIS — F209 Schizophrenia, unspecified: Secondary | ICD-10-CM | POA: Diagnosis not present

## 2014-04-13 DIAGNOSIS — F319 Bipolar disorder, unspecified: Secondary | ICD-10-CM | POA: Diagnosis not present

## 2014-04-13 DIAGNOSIS — S93601A Unspecified sprain of right foot, initial encounter: Secondary | ICD-10-CM | POA: Insufficient documentation

## 2014-04-13 DIAGNOSIS — S99921A Unspecified injury of right foot, initial encounter: Secondary | ICD-10-CM | POA: Diagnosis present

## 2014-04-13 DIAGNOSIS — Y929 Unspecified place or not applicable: Secondary | ICD-10-CM | POA: Insufficient documentation

## 2014-04-13 DIAGNOSIS — J45909 Unspecified asthma, uncomplicated: Secondary | ICD-10-CM | POA: Insufficient documentation

## 2014-04-13 DIAGNOSIS — Y9301 Activity, walking, marching and hiking: Secondary | ICD-10-CM | POA: Diagnosis not present

## 2014-04-13 DIAGNOSIS — Z880 Allergy status to penicillin: Secondary | ICD-10-CM | POA: Insufficient documentation

## 2014-04-13 DIAGNOSIS — M79671 Pain in right foot: Secondary | ICD-10-CM

## 2014-04-13 DIAGNOSIS — W1842XA Slipping, tripping and stumbling without falling due to stepping into hole or opening, initial encounter: Secondary | ICD-10-CM | POA: Insufficient documentation

## 2014-04-13 DIAGNOSIS — Z79899 Other long term (current) drug therapy: Secondary | ICD-10-CM | POA: Diagnosis not present

## 2014-04-13 MED ORDER — IBUPROFEN 600 MG PO TABS: 600.0000 mg | ORAL_TABLET | Freq: Four times a day (QID) | ORAL | Status: DC | PRN

## 2014-04-13 NOTE — ED Provider Notes (Signed)
CSN: [id number]     Arrival date & time 04/13/14  1220 History   First MD Initiated Contact with Patient 04/13/14 1246     Chief Complaint  Patient presents with  . Foot Pain     (Consider location/radiation/quality/duration/timing/severity/associated sxs/prior Treatment) The history is provided by the patient.   Kristen Ryan is a 58 y.o. female   Presenting with right dorsal foot pain and swelling after stepping into a hole while walking, causing hyperextension of the foot.  She did not fall and denies any other injury.  Her pain has been constant despite rest and elevation today. She describes aching, non radiating pain worsened with palpation, movement and weight bearing. She denies pain or numbness in her toes. She has taken no medicines today for her symptoms.  Past Medical History  Diagnosis Date  . Asthma   . Bipolar 1 disorder   . Schizophrenia    Past Surgical History  Procedure Laterality Date  . Abdominal hysterectomy    . Tonsillectomy    . Knee surgery    . Elbow surgery    . Foot surgery    . Cesarean section     History reviewed. No pertinent family history. History  Substance Use Topics  . Smoking status: Former Games developermoker  . Smokeless tobacco: Not on file  . Alcohol Use: No   OB History   Grav Para Term Preterm Abortions TAB SAB Ect Mult Living                 Review of Systems  Musculoskeletal: Positive for arthralgias and joint swelling.  Skin: Negative for wound.  Neurological: Negative for weakness and numbness.      Allergies  Aminophylline; Sumatriptan; Theophylline; Codeine; Depakote; Divalproex sodium; Doxycycline; Lamictal; Penicillins; Pentazocine lactate; Risperidone; Tetracyclines & related; Zyprexa; and Tetracycline  Home Medications   Prior to Admission medications   Medication Sig Start Date End Date Taking? Authorizing Provider  albuterol (PROVENTIL HFA;VENTOLIN HFA) 108 (90 BASE) MCG/ACT inhaler Inhale 2 puffs into the lungs  every 6 (six) hours as needed for wheezing.   Yes Historical Provider, MD  ALPRAZolam Prudy Feeler(XANAX) 1 MG tablet Take 1 mg by mouth 3 (three) times daily as needed for sleep or anxiety.    Yes Historical Provider, MD  aspirin EC 81 MG tablet Take 81 mg by mouth daily.   Yes Historical Provider, MD  budesonide-formoterol (SYMBICORT) 160-4.5 MCG/ACT inhaler Inhale 2 puffs into the lungs 2 (two) times daily. 03/25/14  Yes Toy BakerAnthony T Allen, MD  cyclobenzaprine (FLEXERIL) 10 MG tablet Take 1 tablet (10 mg total) by mouth 2 (two) times daily as needed for muscle spasms. 03/12/14  Yes Linwood DibblesJon Knapp, MD  gabapentin (NEURONTIN) 300 MG capsule Take 300 mg by mouth 2 (two) times daily.   Yes Historical Provider, MD  levothyroxine (SYNTHROID, LEVOTHROID) 50 MCG tablet Take 50 mcg by mouth daily.   Yes Historical Provider, MD  lithium carbonate 300 MG capsule Take 300 mg by mouth at bedtime.   Yes Historical Provider, MD  loratadine (ALLERGY RELIEF) 10 MG tablet Take 1 mg by mouth daily.   Yes Historical Provider, MD  omeprazole (PRILOSEC) 20 MG capsule Take 1 capsule (20 mg total) by mouth daily. 04/04/14  Yes Lyanne CoKevin M Campos, MD  sodium chloride (OCEAN) 0.65 % SOLN nasal spray Place 1 spray into both nostrils as needed for congestion.   Yes Historical Provider, MD  ibuprofen (ADVIL,MOTRIN) 600 MG tablet Take 1 tablet (600 mg total) by  mouth every 6 (six) hours as needed. 04/13/14   Burgess AmorJulie Lynton Crescenzo, PA-C   BP 121/71  Pulse 81  Temp(Src) 98.8 F (37.1 C) (Oral)  Resp 16  Ht 5\' 8"  (1.727 m)  Wt 150 lb (68.04 kg)  BMI 22.81 kg/m2  SpO2 99% Physical Exam  Nursing note and vitals reviewed. Constitutional: She appears well-developed and well-nourished.  HENT:  Head: Normocephalic.  Cardiovascular: Normal rate and intact distal pulses.  Exam reveals no decreased pulses.   Pulses:      Dorsalis pedis pulses are 2+ on the right side, and 2+ on the left side.       Posterior tibial pulses are 2+ on the right side, and 2+ on the  left side.  Musculoskeletal: She exhibits edema and tenderness.       Right ankle: She exhibits normal range of motion and no swelling. No lateral malleolus, no medial malleolus, no head of 5th metatarsal and no proximal fibula tenderness found. Achilles tendon normal.       Right foot: She exhibits bony tenderness and swelling. She exhibits normal capillary refill.       Feet:  Neurological: She is alert. No sensory deficit.  Skin: Skin is warm, dry and intact.    ED Course  Procedures (including critical care time) Labs Review Labs Reviewed - No data to display  Imaging Review Dg Foot Complete Right  04/13/2014   CLINICAL DATA:  One day history of pain dorsal midfoot following trauma with stepping into a hole  EXAM: RIGHT FOOT COMPLETE - 3+ VIEW  COMPARISON:  Dec 02, 2006  FINDINGS: Frontal, oblique, and lateral views were obtained. There is no fracture or dislocation. There has been progression of osteoarthritic change in the tarsal-metatarsal joint region medially. Other joint spaces appear normal. No erosive change.  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 Other joint spaces appear unremarkable. No fracture or dislocation.   Electronically Signed   By: Bretta BangWilliam  Woodruff M.D.   On: 04/13/2014 13:42     EKG Interpretation None      MDM   Final diagnoses:  Foot sprain, right, initial encounter    Patients labs and/or radiological studies were viewed and considered during the medical decision making and disposition process. Pt placed in ace wrap.  Offered crutches, pt prefers cane which she has with her.  Encouraged continued ice, elevation.  Prescribed ibuprofen.  F/u with Dr Romeo AppleHarrison if not better over the next 7-10 days.  Has seen Dr. Romeo AppleHarrison for left knee surgery in the past.    Burgess AmorJulie Asha Grumbine, PA-C 04/13/14 2140

## 2014-04-13 NOTE — ED Notes (Signed)
Pain rt foot, already eval by Pa when seen by me

## 2014-04-13 NOTE — ED Notes (Signed)
Pt states tripped and fell in hole last night. Now complains of right foot pain.

## 2014-04-13 NOTE — Discharge Instructions (Signed)
Foot Sprain The muscles and cord like structures which attach muscle to bone (tendons) that surround the feet are made up of units. A foot sprain can occur at the weakest spot in any of these units. This condition is most often caused by injury to or overuse of the foot, as from playing contact sports, or aggravating a previous injury, or from poor conditioning, or obesity. SYMPTOMS  Pain with movement of the foot.  Tenderness and swelling at the injury site.  Loss of strength is present in moderate or severe sprains. THE THREE GRADES OR SEVERITY OF FOOT SPRAIN ARE:  Mild (Grade I): Slightly pulled muscle without tearing of muscle or tendon fibers or loss of strength.  Moderate (Grade II): Tearing of fibers in a muscle, tendon, or at the attachment to bone, with small decrease in strength.  Severe (Grade III): Rupture of the muscle-tendon-bone attachment, with separation of fibers. Severe sprain requires surgical repair. Often repeating (chronic) sprains are caused by overuse. Sudden (acute) sprains are caused by direct injury or over-use. DIAGNOSIS  Diagnosis of this condition is usually by your own observation. If problems continue, a caregiver may be required for further evaluation and treatment. X-rays may be required to make sure there are not breaks in the bones (fractures) present. Continued problems may require physical therapy for treatment. PREVENTION  Use strength and conditioning exercises appropriate for your sport.  Warm up properly prior to working out.  Use athletic shoes that are made for the sport you are participating in.  Allow adequate time for healing. Early return to activities makes repeat injury more likely, and can lead to an unstable arthritic foot that can result in prolonged disability. Mild sprains generally heal in 3 to 10 days, with moderate and severe sprains taking 2 to 10 weeks. Your caregiver can help you determine the proper time required for  healing. HOME CARE INSTRUCTIONS   Apply ice to the injury for 15-20 minutes, 03-04 times per day. Put the ice in a plastic bag and place a towel between the bag of ice and your skin.  An elastic wrap (like an Ace bandage) may be used to keep swelling down.  Keep foot above the level of the heart, or at least raised on a footstool, when swelling and pain are present.  Try to avoid use other than gentle range of motion while the foot is painful. Do not resume use until instructed by your caregiver. Then begin use gradually, not increasing use to the point of pain. If pain does develop, decrease use and continue the above measures, gradually increasing activities that do not cause discomfort, until you gradually achieve normal use.  Use crutches if and as instructed, and for the length of time instructed.  Keep injured foot and ankle wrapped between treatments.  Massage foot and ankle for comfort and to keep swelling down. Massage from the toes up towards the knee.  Only take over-the-counter or prescription medicines for pain, discomfort, or fever as directed by your caregiver. SEEK IMMEDIATE MEDICAL CARE IF:   Your pain and swelling increase, or pain is not controlled with medications.  You have loss of feeling in your foot or your foot turns cold or blue.  You develop new, unexplained symptoms, or an increase of the symptoms that brought you to your caregiver. MAKE SURE YOU:   Understand these instructions.  Will watch your condition.  Will get help right away if you are not doing well or get worse. Document Released:  12/12/2001 Document Revised: 09/14/2011 Document Reviewed: 02/09/2008 ExitCare Patient Information 2015 [employer]. This information is not intended to replace advice given to you by your health care provider. Make sure you discuss any questions you have with your health care provider.   Ice and elevate your foot as much as possible over the next several days.   You may add a heating pad for 20 minutes 3 times daily starting on Monday.  Continue using your pain to minimize weightbearing and Ace wrap your foot.  Call Dr. Romeo AppleHarrison for recheck if not improving over the next week.

## 2014-04-14 NOTE — ED Provider Notes (Signed)
Medical screening examination/treatment/procedure(s) were performed by non-physician practitioner and as supervising physician I was immediately available for consultation/collaboration.   EKG Interpretation None        Takasha Vetere W Sixto Bowdish, MD 04/14/14 2114 

## 2014-05-02 ENCOUNTER — Emergency Department (HOSPITAL_COMMUNITY)
Admission: EM | Admit: 2014-05-02 | Discharge: 2014-05-02 | Disposition: A | Discharge status: Home / Self Care | Payer: PRIVATE HEALTH INSURANCE | Attending: Emergency Medicine | Admitting: Emergency Medicine

## 2014-05-02 ENCOUNTER — Encounter (HOSPITAL_COMMUNITY): Payer: Self-pay | Admitting: Emergency Medicine

## 2014-05-02 DIAGNOSIS — Z7951 Long term (current) use of inhaled steroids: Secondary | ICD-10-CM | POA: Insufficient documentation

## 2014-05-02 DIAGNOSIS — Z7982 Long term (current) use of aspirin: Secondary | ICD-10-CM | POA: Insufficient documentation

## 2014-05-02 DIAGNOSIS — M79604 Pain in right leg: Secondary | ICD-10-CM | POA: Diagnosis not present

## 2014-05-02 DIAGNOSIS — Z79899 Other long term (current) drug therapy: Secondary | ICD-10-CM | POA: Diagnosis not present

## 2014-05-02 DIAGNOSIS — Z87891 Personal history of nicotine dependence: Secondary | ICD-10-CM | POA: Diagnosis not present

## 2014-05-02 DIAGNOSIS — F319 Bipolar disorder, unspecified: Secondary | ICD-10-CM | POA: Insufficient documentation

## 2014-05-02 DIAGNOSIS — F209 Schizophrenia, unspecified: Secondary | ICD-10-CM | POA: Diagnosis not present

## 2014-05-02 DIAGNOSIS — Z880 Allergy status to penicillin: Secondary | ICD-10-CM | POA: Diagnosis not present

## 2014-05-02 DIAGNOSIS — J45909 Unspecified asthma, uncomplicated: Secondary | ICD-10-CM | POA: Diagnosis not present

## 2014-05-02 DIAGNOSIS — R252 Cramp and spasm: Secondary | ICD-10-CM | POA: Insufficient documentation

## 2014-05-02 DIAGNOSIS — M79605 Pain in left leg: Secondary | ICD-10-CM | POA: Diagnosis present

## 2014-05-02 DIAGNOSIS — R944 Abnormal results of kidney function studies: Secondary | ICD-10-CM | POA: Diagnosis not present

## 2014-05-02 LAB — BASIC METABOLIC PANEL
Anion gap: 13 (ref 5–15)
BUN: 25 mg/dL — ABNORMAL HIGH (ref 6–23)
CO2: 28 meq/L (ref 19–32)
CREATININE: 1.19 mg/dL — AB (ref 0.50–1.10)
Calcium: 9.3 mg/dL (ref 8.4–10.5)
Chloride: 99 mEq/L (ref 96–112)
GFR calc Af Amer: 57 mL/min — ABNORMAL LOW (ref >90)
GFR calc non Af Amer: 49 mL/min — ABNORMAL LOW (ref >90)
GLUCOSE: 107 mg/dL — AB (ref 70–99)
Potassium: 3.8 mEq/L (ref 3.7–5.3)
Sodium: 140 mEq/L (ref 137–147)

## 2014-05-02 MED ORDER — HYDROCODONE-ACETAMINOPHEN 5-325 MG PO TABS: ORAL_TABLET | ORAL | Status: DC

## 2014-05-02 MED ORDER — CYCLOBENZAPRINE HCL 10 MG PO TABS
10.0000 mg | ORAL_TABLET | Freq: Once | ORAL | Status: AC
  Administered 2014-05-02: 10 mg via ORAL
  Filled 2014-05-02: qty 1

## 2014-05-02 MED ORDER — CYCLOBENZAPRINE HCL 10 MG PO TABS: 10.0000 mg | ORAL_TABLET | Freq: Three times a day (TID) | ORAL | Status: DC | PRN

## 2014-05-02 MED ORDER — HYDROCODONE-ACETAMINOPHEN 5-325 MG PO TABS
1.0000 | ORAL_TABLET | Freq: Once | ORAL | Status: AC
Start: 2014-05-02 — End: 2014-05-02
  Administered 2014-05-02: 1 via ORAL
  Filled 2014-05-02: qty 1

## 2014-05-02 NOTE — ED Notes (Signed)
bil leg pain, "cramping" for 2 mos

## 2014-05-02 NOTE — Discharge Instructions (Signed)
Muscle Cramps and Spasms Muscle cramps and spasms are when muscles tighten by themselves. They usually get better within minutes. Muscle cramps are painful. They are usually stronger and last longer than muscle spasms. Muscle spasms may or may not be painful. They can last a few seconds or much longer. HOME CARE  Drink enough fluid to keep your pee (urine) clear or pale yellow.  Massage, stretch, and relax the muscle.  Use a warm towel, heating pad, or warm shower water on tight muscles.  Place ice on the muscle if it is tender or in pain.  Put ice in a plastic bag.  Place a towel between your skin and the bag.  Leave the ice on for 15-20 minutes, 03-04 times a day.  Only take medicine as told by your doctor. GET HELP RIGHT AWAY IF:  Your cramps or spasms get worse, happen more often, or do not get better with time. MAKE SURE YOU:  Understand these instructions.  Will watch your condition.  Will get help right away if you are not doing well or get worse. Document Released: 06/04/2008 Document Revised: 10/17/2012 Document Reviewed: 06/08/2012 [facility] Region[facility] Patient Information 2015 [place], [place]. This information is not intended to replace advice given to you by your health care provider. Make sure you discuss any questions you have with your health care provider.  Leg Cramps Leg cramps that occur during exercise can be caused by poor circulation or dehydration. However, muscle cramps that occur at rest or during the night are usually not due to any serious medical problem. Heat cramps may cause muscle spasms during hot weather.  CAUSES There is no clear cause for muscle cramps. However, dehydration may be a factor for those who do not drink enough fluids and those who exercise in the heat. Imbalances in the level of sodium, potassium, calcium or magnesium in the muscle tissue may also be a factor. Some medications, such as water pills (diuretics), may cause loss of chemicals that the  body needs (like sodium and potassium) and cause muscle cramps. TREATMENT   Make sure your diet has enough fluids and essential minerals for the muscle to work normally.  Avoid strenuous exercise for several days if you have been having frequent leg cramps.  Stretch and massage the cramped muscle for several minutes.  Some medicines may be helpful in some patients with night cramps. Only take over-the-counter or prescription medicines as directed by your caregiver. SEEK IMMEDIATE MEDICAL CARE IF:   Your leg cramps become worse.  Your foot becomes cold, numb, or blue. Document Released: 07/30/2004 Document Revised: 09/14/2011 Document Reviewed: 07/17/2008 [facility] Patient Information 2015 [place], [place]. This information is not intended to replace advice given to you by your health care provider. Make sure you discuss any questions you have with your health care provider.

## 2014-05-05 NOTE — ED Provider Notes (Signed)
Medical screening examination/treatment/procedure(s) were performed by non-physician practitioner and as supervising physician I was immediately available for consultation/collaboration.   EKG Interpretation None        Sapir Lavey L Krystianna Soth, MD 05/05/14 2024 

## 2014-05-05 NOTE — ED Provider Notes (Signed)
CSN: [id number]     Arrival date & time 05/02/14  1844 History   First MD Initiated Contact with Patient 05/02/14 1935     Chief Complaint  Patient presents with  . Leg Pain     (Consider location/radiation/quality/duration/timing/severity/associated sxs/prior Treatment) HPI   Kristen Ryan is a 58 y.o. female who presents to the Emergency Department complaining of muscle cramps to both her lower legs.  She reports having a h/o of peripheral neuropathy and chronic back pain.  She also states the symptoms have been present for 2 months.  She has not seen her primary care provider for her symptoms. She states the pain is worse with rest and improves upon standing and walking. She denies any injury, redness of her legs, rash, swelling, numbness or weakness.  She takes Neurontin daily without relief.    Past Medical History  Diagnosis Date  . Asthma   . Bipolar 1 disorder   . Schizophrenia    Past Surgical History  Procedure Laterality Date  . Abdominal hysterectomy    . Tonsillectomy    . Knee surgery    . Elbow surgery    . Foot surgery    . Cesarean section     History reviewed. No pertinent family history. History  Substance Use Topics  . Smoking status: Former Games developermoker  . Smokeless tobacco: Not on file  . Alcohol Use: No   OB History   Grav Para Term Preterm Abortions TAB SAB Ect Mult Living                 Review of Systems  Constitutional: Negative for fever.  Respiratory: Negative for shortness of breath.   Gastrointestinal: Negative for vomiting, abdominal pain and constipation.  Genitourinary: Negative for dysuria, hematuria, flank pain, decreased urine volume and difficulty urinating.       Low back pain  Musculoskeletal: Positive for myalgias. Negative for joint swelling and neck pain.       Muscle cramps to both lower legs  Skin: Negative for rash.  Neurological: Negative for dizziness, weakness and numbness.  All other systems reviewed and are  negative.     Allergies  Aminophylline; Sumatriptan; Theophylline; Codeine; Depakote; Divalproex sodium; Doxycycline; Lamictal; Magnesium-containing compounds; Penicillins; Pentazocine lactate; Risperidone; Tetracyclines & related; Tetracycline; and Zyprexa  Home Medications   Prior to Admission medications   Medication Sig Start Date End Date Taking? Authorizing Provider  albuterol (PROVENTIL HFA;VENTOLIN HFA) 108 (90 BASE) MCG/ACT inhaler Inhale 2 puffs into the lungs every 6 (six) hours as needed for wheezing.   Yes Historical Provider, MD  ALPRAZolam Prudy Feeler(XANAX) 1 MG tablet Take 1 mg by mouth 3 (three) times daily as needed for sleep or anxiety.    Yes Historical Provider, MD  aspirin EC 81 MG tablet Take 81 mg by mouth daily.   Yes Historical Provider, MD  budesonide-formoterol (SYMBICORT) 160-4.5 MCG/ACT inhaler Inhale 2 puffs into the lungs 2 (two) times daily. 03/25/14  Yes Toy BakerAnthony T Allen, MD  cyclobenzaprine (FLEXERIL) 10 MG tablet Take 10 mg by mouth every 4 (four) hours.   Yes Historical Provider, MD  gabapentin (NEURONTIN) 300 MG capsule Take 300 mg by mouth 2 (two) times daily.   Yes Historical Provider, MD  ibuprofen (ADVIL,MOTRIN) 600 MG tablet Take 1 tablet (600 mg total) by mouth every 6 (six) hours as needed. 04/13/14  Yes Raynelle FanningJulie Idol, PA-C  levothyroxine (SYNTHROID, LEVOTHROID) 50 MCG tablet Take 50 mcg by mouth daily.   Yes Historical Provider, MD  lithium carbonate 300 MG capsule Take 300 mg by mouth at bedtime.   Yes Historical Provider, MD  loratadine (ALLERGY RELIEF) 10 MG tablet Take 1 mg by mouth daily.   Yes Historical Provider, MD  omeprazole (PRILOSEC) 20 MG capsule Take 1 capsule (20 mg total) by mouth daily. 04/04/14  Yes Lyanne CoKevin M Campos, MD  sodium chloride (OCEAN) 0.65 % SOLN nasal spray Place 1 spray into both nostrils as needed for congestion.   Yes Historical Provider, MD  cyclobenzaprine (FLEXERIL) 10 MG tablet Take 1 tablet (10 mg total) by mouth 3 (three) times  daily as needed. 05/02/14   Eilish Mcdaniel L. Wardell Pokorski, PA-C  HYDROcodone-acetaminophen (NORCO/VICODIN) 5-325 MG per tablet Take one-two tabs po q 4-6 hrs prn pain 05/02/14   Kenli Waldo L. Chasin Findling, PA-C   BP 147/92  Pulse 105  Temp(Src) 99 F (37.2 C) (Oral)  Resp 20  Ht 5\' 7"  (1.702 m)  Wt 150 lb (68.04 kg)  BMI 23.49 kg/m2  SpO2 97% Physical Exam  Constitutional: She is oriented to person, place, and time. She appears well-developed and well-nourished. No distress.  HENT:  Head: Normocephalic and atraumatic.  Neck: Normal range of motion. Neck supple.  Cardiovascular: Normal rate, regular rhythm, normal heart sounds and intact distal pulses.   No murmur heard. Pulmonary/Chest: Effort normal and breath sounds normal. No respiratory distress.  Musculoskeletal: Normal range of motion. She exhibits no edema.  Mild tenderness to palpation of the bilateral lower extremities. No edema or erythema. Compartments of the lower extremities are soft, DP pulses and distal sensation intact. Patient has full range of motion bilaterally  Neurological: She is alert and oriented to person, place, and time. She exhibits normal muscle tone. Coordination normal.  Skin: Skin is warm and dry. No erythema.    ED Course  Procedures (including critical care time) Labs Review Labs Reviewed  BASIC METABOLIC PANEL - Abnormal; Notable for the following:    Glucose, Bld 107 (*)    BUN 25 (*)    Creatinine, Ser 1.19 (*)    GFR calc non Af Amer 49 (*)    GFR calc Af Amer 57 (*)    All other components within normal limits    Imaging Review No results found.   EKG Interpretation None      MDM   Final diagnoses:  Muscle cramps  Abnormal results of kidney function studies    Patient is well appearing.  VSS.  Here for chronic bilateral leg cramps for 2 months.  No concerning sx's for DVT or cellulitis, NV intact.  Ambulates with a steady gait.  Rx for vicodin and flexeril.  She agrees to arrange follow-up with  her PMD.     Everard Interrante L. Trisha Mangleriplett, PA-C 05/05/14 1803

## 2014-05-08 ENCOUNTER — Other Ambulatory Visit: Payer: Self-pay | Admitting: Adult Health

## 2014-06-04 ENCOUNTER — Encounter (HOSPITAL_COMMUNITY): Payer: Self-pay | Admitting: Emergency Medicine

## 2014-06-04 ENCOUNTER — Emergency Department (HOSPITAL_COMMUNITY)
Admission: EM | Admit: 2014-06-04 | Discharge: 2014-06-04 | Disposition: A | Discharge status: Home / Self Care | Payer: PRIVATE HEALTH INSURANCE | Attending: Emergency Medicine | Admitting: Emergency Medicine

## 2014-06-04 DIAGNOSIS — Z8669 Personal history of other diseases of the nervous system and sense organs: Secondary | ICD-10-CM | POA: Insufficient documentation

## 2014-06-04 DIAGNOSIS — M79662 Pain in left lower leg: Secondary | ICD-10-CM | POA: Diagnosis not present

## 2014-06-04 DIAGNOSIS — Z880 Allergy status to penicillin: Secondary | ICD-10-CM | POA: Insufficient documentation

## 2014-06-04 DIAGNOSIS — M79606 Pain in leg, unspecified: Secondary | ICD-10-CM

## 2014-06-04 DIAGNOSIS — Z7982 Long term (current) use of aspirin: Secondary | ICD-10-CM | POA: Insufficient documentation

## 2014-06-04 DIAGNOSIS — F319 Bipolar disorder, unspecified: Secondary | ICD-10-CM | POA: Insufficient documentation

## 2014-06-04 DIAGNOSIS — Z9889 Other specified postprocedural states: Secondary | ICD-10-CM | POA: Diagnosis not present

## 2014-06-04 DIAGNOSIS — M79661 Pain in right lower leg: Secondary | ICD-10-CM | POA: Insufficient documentation

## 2014-06-04 DIAGNOSIS — J45901 Unspecified asthma with (acute) exacerbation: Secondary | ICD-10-CM | POA: Diagnosis not present

## 2014-06-04 DIAGNOSIS — F209 Schizophrenia, unspecified: Secondary | ICD-10-CM | POA: Insufficient documentation

## 2014-06-04 DIAGNOSIS — R202 Paresthesia of skin: Secondary | ICD-10-CM | POA: Insufficient documentation

## 2014-06-04 HISTORY — DX: Polyneuropathy, unspecified: G62.9

## 2014-06-04 MED ORDER — HYDROCODONE-ACETAMINOPHEN 5-325 MG PO TABS: 1.0000 | ORAL_TABLET | ORAL | Status: DC | PRN

## 2014-06-04 NOTE — ED Provider Notes (Signed)
CSN: [id number]     Arrival date & time 06/04/14  2154 History  This chart was scribe for No att. providers found by Angelene GiovanniEmmanuella Mensah, ED Scribe. The patient was seen in room APA10/APA10 and the patient's care was started at 11:13 PM.    Chief Complaint  Patient presents with  . Peripheral Neuropathy   The history is provided by the patient. No language interpreter was used.   HPI Comments: Kristen Ryan is a 58 y.o. female who presents to the Emergency Department complaining of peripheral neuropathy in both of her legs onset this summer. She is here today because the pain in her legs have gotten worse with a burning sensation in her legs. She reports associated intermittent numbness, tingling, and weakness in her bilateral legs. She also reports back pain and cold feet. She denies fever and N/V. She reports that she can walk on her legs but when she walks, she cannot feel her feet on the ground. She reports taking Tylenol every 4 hours with no relief. She denies currently taking any pain medication. She currently sees a psychiatrist and has been on Neurontin since October.  Her PCP told her to stop her lithium, because of renal insufficiency.  She reports intermittent chronic low back pain associated with degenerative changes, and "arthritis.  She's never had back surgery.  She denies urinary or bowel changes.  Has been no fever, chills, nausea or vomiting.  There are no other known modifying factors.  Past Medical History  Diagnosis Date  . Asthma   . Bipolar 1 disorder   . Schizophrenia   . Neuropathy    Past Surgical History  Procedure Laterality Date  . Abdominal hysterectomy    . Tonsillectomy    . Knee surgery    . Elbow surgery    . Foot surgery    . Cesarean section     No family history on file. History  Substance Use Topics  . Smoking status: Former Games developermoker  . Smokeless tobacco: Not on file  . Alcohol Use: No   OB History    No data available     Review of Systems   Constitutional: Negative for fever.  Gastrointestinal: Negative for nausea and vomiting.  Musculoskeletal: Positive for back pain and arthralgias.  Neurological: Positive for weakness and numbness.  All other systems reviewed and are negative.     Allergies  Aminophylline; Sumatriptan; Theophylline; Codeine; Depakote; Divalproex sodium; Doxycycline; Lamictal; Magnesium-containing compounds; Penicillins; Pentazocine lactate; Risperidone; Tetracyclines & related; Vistaril; Tetracycline; and Zyprexa  Home Medications   Prior to Admission medications   Medication Sig Start Date End Date Taking? Authorizing Provider  albuterol (PROVENTIL HFA;VENTOLIN HFA) 108 (90 BASE) MCG/ACT inhaler Inhale 2 puffs into the lungs every 6 (six) hours as needed for wheezing.    Historical Provider, MD  ALPRAZolam Prudy Feeler(XANAX) 1 MG tablet Take 1 mg by mouth 3 (three) times daily as needed for sleep or anxiety.     Historical Provider, MD  aspirin EC 81 MG tablet Take 81 mg by mouth daily.    Historical Provider, MD  budesonide-formoterol (SYMBICORT) 160-4.5 MCG/ACT inhaler Inhale 2 puffs into the lungs 2 (two) times daily. 03/25/14   Toy BakerAnthony T Allen, MD  cyclobenzaprine (FLEXERIL) 10 MG tablet Take 10 mg by mouth every 4 (four) hours.    Historical Provider, MD  cyclobenzaprine (FLEXERIL) 10 MG tablet Take 1 tablet (10 mg total) by mouth 3 (three) times daily as needed. 05/02/14   Tammy L. Triplett,  PA-C  gabapentin (NEURONTIN) 300 MG capsule Take 300 mg by mouth 2 (two) times daily.    Historical Provider, MD  HYDROcodone-acetaminophen (NORCO) 5-325 MG per tablet Take 1 tablet by mouth every 4 (four) hours as needed. 06/04/14   Flint Melter, MD  ibuprofen (ADVIL,MOTRIN) 600 MG tablet Take 1 tablet (600 mg total) by mouth every 6 (six) hours as needed. 04/13/14   Burgess Amor, PA-C  levothyroxine (SYNTHROID, LEVOTHROID) 50 MCG tablet Take 50 mcg by mouth daily.    Historical Provider, MD  lithium carbonate 300 MG  capsule Take 300 mg by mouth at bedtime.    Historical Provider, MD  loratadine (ALLERGY RELIEF) 10 MG tablet Take 1 mg by mouth daily.    Historical Provider, MD  omeprazole (PRILOSEC) 20 MG capsule Take 1 capsule (20 mg total) by mouth daily. 04/04/14   Lyanne Co, MD  sodium chloride (OCEAN) 0.65 % SOLN nasal spray Place 1 spray into both nostrils as needed for congestion.    Historical Provider, MD   BP 114/77 mmHg  Pulse 80  Temp(Src) 98 F (36.7 C) (Oral)  Resp 20  Ht 5\' 7"  (1.702 m)  Wt 150 lb (68.04 kg)  BMI 23.49 kg/m2  SpO2 100% Physical Exam  Constitutional: She is oriented to person, place, and time. She appears well-developed and well-nourished.  Eyes: Pupils are equal, round, and reactive to light.  Neck: Normal range of motion.  Cardiovascular: Normal rate, regular rhythm and normal heart sounds.   Pulmonary/Chest: Effort normal. No respiratory distress. She has wheezes. She has no rales. She exhibits no tenderness.  Diffuse mild wheezing  Musculoskeletal:  Back is nontender to palpation.  Normal motion of the lower back.  Neurological: She is alert and oriented to person, place, and time. No cranial nerve deficit. She exhibits normal muscle tone.  Intact light touch and sedation to hands and feet bilaterally.  Normal strength and sensation in arms and legs bilaterally.  Skin: Skin is warm and dry.  Nursing note and vitals reviewed.   ED Course  Procedures (including critical care time) Medications - No data to display  Patient Vitals for the past 24 hrs:  BP Temp Temp src Pulse Resp SpO2 Height Weight  06/04/14 2156 114/77 mmHg 98 F (36.7 C) Oral 80 20 100 % 5\' 7"  (1.702 m) 150 lb (68.04 kg)    COORDINATION OF CARE: 11:22 PM- Pt advised of plan for treatment and pt agrees.    Labs Review Labs Reviewed - No data to display  Imaging Review No results found.   EKG Interpretation None      MDM   Final diagnoses:  Pain of lower extremity,  unspecified laterality  Paresthesias    Nonspecific paresthesias with lower extremity pain, differential diagnosis includes nonspecific neuropathy, lumbar myelopathy, deconditioning and medication toxicity.  She was recently taken off lithium, by her psychiatrist because of renal insufficiency.   Nursing Notes Reviewed/ Care Coordinated Applicable Imaging Reviewed Interpretation of Laboratory Data incorporated into ED treatment  The patient appears reasonably screened and/or stabilized for discharge and I doubt any other medical condition or other [facility] requiring further screening, evaluation, or treatment in the ED at this time prior to discharge.  Plan: Home Medications- Norco; Home Treatments- rest; return here if the recommended treatment, does not improve the symptoms; Recommended follow up- Neurology f/u for further evaluation    I personally performed the services described in this documentation, which was scribed in my presence. The  recorded information has been reviewed and is accurate.    Flint MelterElliott L Jadarius Commons, MD 06/05/14 [id number]

## 2014-06-04 NOTE — ED Notes (Signed)
Pt left Ed, ambulatory with no signs of distress. Pt verbalizes discharge instructions.

## 2014-06-04 NOTE — ED Notes (Signed)
Pt reports neuropathy in both legs. States "I am fighting a real bad case of it for 2 days". Reports pain and tingling from both feet extending into her legs.

## 2014-06-04 NOTE — Discharge Instructions (Signed)
Musculoskeletal Pain Musculoskeletal pain is muscle and boney aches and pains. These pains can occur in any part of the body. Your caregiver may treat you without knowing the cause of the pain. They may treat you if blood or urine tests, X-rays, and other tests were normal.  CAUSES There is often not a definite cause or reason for these pains. These pains may be caused by a type of germ (virus). The discomfort may also come from overuse. Overuse includes working out too hard when your body is not fit. Boney aches also come from weather changes. Bone is sensitive to atmospheric pressure changes. HOME CARE INSTRUCTIONS   Ask when your test results will be ready. Make sure you get your test results.  Only take over-the-counter or prescription medicines for pain, discomfort, or fever as directed by your caregiver. If you were given medications for your condition, do not drive, operate machinery or power tools, or sign legal documents for 24 hours. Do not drink alcohol. Do not take sleeping pills or other medications that may interfere with treatment.  Continue all activities unless the activities cause more pain. When the pain lessens, slowly resume normal activities. Gradually increase the intensity and duration of the activities or exercise.  During periods of severe pain, bed rest may be helpful. Lay or sit in any position that is comfortable.  Putting ice on the injured area.  Put ice in a bag.  Place a towel between your skin and the bag.  Leave the ice on for 15 to 20 minutes, 3 to 4 times a day.  Follow up with your caregiver for continued problems and no reason can be found for the pain. If the pain becomes worse or does not go away, it may be necessary to repeat tests or do additional testing. Your caregiver may need to look further for a possible cause. SEEK IMMEDIATE MEDICAL CARE IF:  You have pain that is getting worse and is not relieved by medications.  You develop chest pain  that is associated with shortness or breath, sweating, feeling sick to your stomach (nauseous), or throw up (vomit).  Your pain becomes localized to the abdomen.  You develop any new symptoms that seem different or that concern you. MAKE SURE YOU:   Understand these instructions.  Will watch your condition.  Will get help right away if you are not doing well or get worse. Document Released: 06/22/2005 Document Revised: 09/14/2011 Document Reviewed: 02/24/2013 [facility] Patient Information 2015 [place], [place]. This information is not intended to replace advice given to you by your health care provider. Make sure you discuss any questions you have with your health care provider.  Myelography Myelography is an X-ray exam in which a special dye (contrast medium) is used to examine your spinal cord and nerve roots. The contrast medium helps to illuminate the spinal structures under examination. The exam is used to detect spinal cord problems, including spinal cord injury, disk ruptures, cysts, and tumors.  LET [facility]nessYOUR HEALTH CARE PROVIDER KNOW ABOUT:  Any allergies you have.  All medicines you are taking, including vitamins, herbs, eyedrops, and over-the-counter medicines and creams.  Previous problems you or members of your family have had with the use of anesthetics or contrast media.  Any blood disorders you have.  Other health problems you have. RISKS AND COMPLICATIONS Generally, myelography is a safe procedure. However, as with any surgical procedure, complications can occur. Possible complications associated with myelography include:  Spinal fluid infection.  Allergic reaction  to the contrast medium.  Loss of spinal fluid (can lead to severe headaches).  Seizures (rare). BEFORE THE PROCEDURE You will need to arrange for someone to drive you home after the procedure.  PROCEDURE   You will be positioned face down on a table.  Medicine may be given to you to help you  relax.  A numbing medicine will be applied to the insertion site.  A needle will be inserted between your vertebrae. An imaging technique called fluoroscopy will be used to help your health care provider see the needle between the bones of your spine and guide it into the sac that surrounds your spinal cord and nerves (dura).  Contrast medium will be injected into the dura.  The table you lie on may be tilted in different directions to move the contrast medium around the dura.  A series of X-rays or computed tomography (CT) will be done. AFTER THE PROCEDURE After your procedure, you will be taken to a recovery area where you will lie flat with your head in an elevated position for a few hours before being discharged. This reduces the risk of a severe headache.  Document Released: 02/13/2004 Document Revised: 11/06/2013 Document Reviewed: 03/16/2012 [facility] Patient Information 2015 [place], [place]. This information is not intended to replace advice given to you by your health care provider. Make sure you discuss any questions you have with your health care provider.  Paresthesia Paresthesia is a burning or prickling feeling. This feeling can happen in any part of the body. It often happens in the hands, arms, legs, or feet. HOME CARE  Avoid drinking alcohol.  Try massage or needle therapy (acupuncture) to help with your problems.  Keep all doctor visits as told. GET HELP RIGHT AWAY IF:   You feel weak.  You have trouble walking or moving.  You have problems speaking or seeing.  You feel confused.  You cannot control when you poop (bowel movement) or pee (urinate).  You lose feeling (numbness) after an injury.  You pass out (faint).  Your burning or prickling feeling gets worse when you walk.  You have pain, cramps, or feel dizzy.  You have a rash. MAKE SURE YOU:   Understand these instructions.  Will watch your condition.  Will get help right away if you are not  doing well or get worse. Document Released: 06/04/2008 Document Revised: 09/14/2011 Document Reviewed: 03/13/2011 [facility] Patient Information 2015 [place], [place]. This information is not intended to replace advice given to you by your health care provider. Make sure you discuss any questions you have with your health care provider.

## 2014-06-04 NOTE — ED Notes (Signed)
Pt states that her doctor states if she comes to the ED to get her kidneys checked.

## 2014-06-07 ENCOUNTER — Other Ambulatory Visit (HOSPITAL_COMMUNITY): Payer: Self-pay | Admitting: Internal Medicine

## 2014-06-07 ENCOUNTER — Ambulatory Visit (HOSPITAL_COMMUNITY)
Admission: RE | Admit: 2014-06-07 | Discharge: 2014-06-07 | Disposition: A | Discharge status: Home / Self Care | Payer: PRIVATE HEALTH INSURANCE | Source: Ambulatory Visit | Attending: Internal Medicine | Admitting: Internal Medicine

## 2014-06-07 DIAGNOSIS — J449 Chronic obstructive pulmonary disease, unspecified: Secondary | ICD-10-CM

## 2014-06-07 DIAGNOSIS — J45909 Unspecified asthma, uncomplicated: Secondary | ICD-10-CM | POA: Insufficient documentation

## 2014-06-13 ENCOUNTER — Ambulatory Visit (INDEPENDENT_AMBULATORY_CARE_PROVIDER_SITE_OTHER): Payer: PRIVATE HEALTH INSURANCE | Admitting: Adult Health

## 2014-06-13 ENCOUNTER — Encounter: Payer: Self-pay | Admitting: Adult Health

## 2014-06-13 VITALS — BP 116/70 | HR 78 | Ht 67.0 in | Wt 159.0 lb

## 2014-06-13 DIAGNOSIS — Z139 Encounter for screening, unspecified: Secondary | ICD-10-CM

## 2014-06-13 DIAGNOSIS — Z01419 Encounter for gynecological examination (general) (routine) without abnormal findings: Secondary | ICD-10-CM

## 2014-06-13 NOTE — Progress Notes (Signed)
Patient ID: Kristen Ryan, female   DOB: 1955/12/24, 58 y.o.   MRN: [id number] History of Present Illness: Kristen Ryan is a 58 year old white female, married, in for gyn exam.She is sp hysterectomy.She sees Dr Felecia ShellingFanta and also Dr. Emerson MonteParrish McKinney. 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  Current Medications, Allergies, Past Medical History, Past Surgical History, Family History and Social History [facility].     Review of Systems: Patient denies any daily headaches(has migraines), blurred vision, shortness of breath, chest pain, abdominal pain, problems with bowel movements or intercourse. Does not pee a lot, no joint swelling, moods stable at present.    Physical Exam:BP 116/70 mmHg  Pulse 78  Ht 5\' 7"  (1.702 m)  Wt 159 lb (72.122 kg)  BMI 24.90 kg/m2 General:  Well developed, well nourished, no acute distress Skin:  Warm and dry, has 2 tattoos Neck:  Midline trachea, normal thyroid Lungs; Clear to auscultation bilaterally Breast:  No dominant palpable mass, retraction, or nipple discharge Cardiovascular: Regular rate and rhythm Abdomen:  Soft, non tender, no hepatosplenomegaly Pelvic:  External genitalia is normal in appearance for age, no lesions.  The vagina has decrease color, moisture and rugae.   The cervix and uterus are absent.  No  adnexal masses or tenderness noted. Rectal: Good sphincter tone, no polyps, or hemorrhoids felt.  Hemoccult negative. Extremities:  No swelling or varicosities noted Psych: alert and cooperative,seems happy   Impression: Well woman gyn exam no pap    Plan: Check CMP Get mammogram now and yearly Referred to Dr Karilyn Cotaehman for screening colonoscopy Physical in 2 years

## 2014-06-13 NOTE — Patient Instructions (Signed)
Get mammogram  Now an yearly Refer to Dr Karilyn Cotaehman for colonoscopy Physical in 2 years

## 2014-06-14 ENCOUNTER — Telehealth: Payer: Self-pay | Admitting: Adult Health

## 2014-06-14 LAB — COMPREHENSIVE METABOLIC PANEL
ALT: 20 U/L (ref 0–35)
AST: 24 U/L (ref 0–37)
Albumin: 3.9 g/dL (ref 3.5–5.2)
Alkaline Phosphatase: 116 U/L (ref 39–117)
BILIRUBIN TOTAL: 0.2 mg/dL (ref 0.2–1.2)
BUN: 15 mg/dL (ref 6–23)
CO2: 29 meq/L (ref 19–32)
CREATININE: 0.76 mg/dL (ref 0.50–1.10)
Calcium: 9.4 mg/dL (ref 8.4–10.5)
Chloride: 102 mEq/L (ref 96–112)
GLUCOSE: 60 mg/dL — AB (ref 70–99)
Potassium: 4.4 mEq/L (ref 3.5–5.3)
Sodium: 140 mEq/L (ref 135–145)
TOTAL PROTEIN: 6.3 g/dL (ref 6.0–8.3)

## 2014-06-14 NOTE — Telephone Encounter (Signed)
Left message BUN and creatinine normal

## 2014-06-15 ENCOUNTER — Telehealth: Payer: Self-pay | Admitting: Adult Health

## 2014-06-15 ENCOUNTER — Encounter (INDEPENDENT_AMBULATORY_CARE_PROVIDER_SITE_OTHER): Payer: Self-pay | Admitting: *Deleted

## 2014-06-15 NOTE — Telephone Encounter (Signed)
Pt aware of results good per Victorino DikeJennifer!

## 2014-07-18 ENCOUNTER — Other Ambulatory Visit (INDEPENDENT_AMBULATORY_CARE_PROVIDER_SITE_OTHER): Payer: Self-pay | Admitting: *Deleted

## 2014-07-18 ENCOUNTER — Telehealth (INDEPENDENT_AMBULATORY_CARE_PROVIDER_SITE_OTHER): Payer: Self-pay | Admitting: *Deleted

## 2014-07-18 DIAGNOSIS — Z1211 Encounter for screening for malignant neoplasm of colon: Secondary | ICD-10-CM

## 2014-07-18 NOTE — Telephone Encounter (Signed)
Patient needs trilyte 

## 2014-07-20 MED ORDER — PEG 3350-KCL-NA BICARB-NACL 420 G PO SOLR: 4000.0000 mL | Freq: Once | ORAL | Status: DC

## 2014-08-01 ENCOUNTER — Encounter (HOSPITAL_COMMUNITY): Payer: Self-pay | Admitting: Cardiology

## 2014-08-01 ENCOUNTER — Emergency Department (HOSPITAL_COMMUNITY): Payer: Medicare Other

## 2014-08-01 ENCOUNTER — Emergency Department (HOSPITAL_COMMUNITY)
Admission: EM | Admit: 2014-08-01 | Discharge: 2014-08-01 | Disposition: A | Discharge status: Home / Self Care | Payer: Medicare Other | Attending: Emergency Medicine | Admitting: Emergency Medicine

## 2014-08-01 DIAGNOSIS — G629 Polyneuropathy, unspecified: Secondary | ICD-10-CM | POA: Diagnosis not present

## 2014-08-01 DIAGNOSIS — F209 Schizophrenia, unspecified: Secondary | ICD-10-CM | POA: Insufficient documentation

## 2014-08-01 DIAGNOSIS — Z880 Allergy status to penicillin: Secondary | ICD-10-CM | POA: Diagnosis not present

## 2014-08-01 DIAGNOSIS — Y9389 Activity, other specified: Secondary | ICD-10-CM | POA: Insufficient documentation

## 2014-08-01 DIAGNOSIS — F319 Bipolar disorder, unspecified: Secondary | ICD-10-CM | POA: Insufficient documentation

## 2014-08-01 DIAGNOSIS — Z79899 Other long term (current) drug therapy: Secondary | ICD-10-CM | POA: Insufficient documentation

## 2014-08-01 DIAGNOSIS — Z87891 Personal history of nicotine dependence: Secondary | ICD-10-CM | POA: Insufficient documentation

## 2014-08-01 DIAGNOSIS — Y9289 Other specified places as the place of occurrence of the external cause: Secondary | ICD-10-CM | POA: Insufficient documentation

## 2014-08-01 DIAGNOSIS — M5432 Sciatica, left side: Secondary | ICD-10-CM | POA: Insufficient documentation

## 2014-08-01 DIAGNOSIS — J45909 Unspecified asthma, uncomplicated: Secondary | ICD-10-CM | POA: Insufficient documentation

## 2014-08-01 DIAGNOSIS — Z7982 Long term (current) use of aspirin: Secondary | ICD-10-CM | POA: Insufficient documentation

## 2014-08-01 DIAGNOSIS — M545 Low back pain: Secondary | ICD-10-CM | POA: Diagnosis not present

## 2014-08-01 DIAGNOSIS — E079 Disorder of thyroid, unspecified: Secondary | ICD-10-CM | POA: Insufficient documentation

## 2014-08-01 DIAGNOSIS — W1830XA Fall on same level, unspecified, initial encounter: Secondary | ICD-10-CM | POA: Insufficient documentation

## 2014-08-01 DIAGNOSIS — S3992XA Unspecified injury of lower back, initial encounter: Secondary | ICD-10-CM | POA: Insufficient documentation

## 2014-08-01 DIAGNOSIS — Y998 Other external cause status: Secondary | ICD-10-CM | POA: Insufficient documentation

## 2014-08-01 DIAGNOSIS — Z7951 Long term (current) use of inhaled steroids: Secondary | ICD-10-CM | POA: Diagnosis not present

## 2014-08-01 MED ORDER — OXYCODONE-ACETAMINOPHEN 5-325 MG PO TABS: 1.0000 | ORAL_TABLET | ORAL | Status: DC | PRN

## 2014-08-01 MED ORDER — OXYCODONE-ACETAMINOPHEN 5-325 MG PO TABS
2.0000 | ORAL_TABLET | Freq: Once | ORAL | Status: AC
  Administered 2014-08-01: 2 via ORAL
  Filled 2014-08-01: qty 2

## 2014-08-01 NOTE — Discharge Instructions (Signed)
Lumbosacral Strain Lumbosacral strain is a strain of any of the parts that make up your lumbosacral vertebrae. Your lumbosacral vertebrae are the bones that make up the lower third of your backbone. Your lumbosacral vertebrae are held together by muscles and tough, fibrous tissue (ligaments).  CAUSES  A sudden blow to your back can cause lumbosacral strain. Also, anything that causes an excessive stretch of the muscles in the low back can cause this strain. This is typically seen when people exert themselves strenuously, fall, lift heavy objects, bend, or crouch repeatedly. RISK FACTORS  Physically demanding work.  Participation in pushing or pulling sports or sports that require a sudden twist of the back (tennis, golf, baseball).  Weight lifting.  Excessive lower back curvature.  Forward-tilted pelvis.  Weak back or abdominal muscles or both.  Tight hamstrings. SIGNS AND SYMPTOMS  Lumbosacral strain may cause pain in the area of your injury or pain that moves (radiates) down your leg.  DIAGNOSIS Your health care provider can often diagnose lumbosacral strain through a physical exam. In some cases, you may need tests such as X-ray exams.  TREATMENT  Treatment for your lower back injury depends on many factors that your clinician will have to evaluate. However, most treatment will include the use of anti-inflammatory medicines. HOME CARE INSTRUCTIONS   Avoid hard physical activities (tennis, racquetball, waterskiing) if you are not in proper physical condition for it. This may aggravate or create problems.  If you have a back problem, avoid sports requiring sudden body movements. Swimming and walking are generally safer activities.  Maintain good posture.  Maintain a healthy weight.  For acute conditions, you may put ice on the injured area.  Put ice in a plastic bag.  Place a towel between your skin and the bag.  Leave the ice on for 20 minutes, 2-3 times a day.  When the  low back starts healing, stretching and strengthening exercises may be recommended. SEEK MEDICAL CARE IF:  Your back pain is getting worse.  You experience severe back pain not relieved with medicines. SEEK IMMEDIATE MEDICAL CARE IF:   You have numbness, tingling, weakness, or problems with the use of your arms or legs.  There is a change in bowel or bladder control.  You have increasing pain in any area of the body, including your belly (abdomen).  You notice shortness of breath, dizziness, or feel faint.  You feel sick to your stomach (nauseous), are throwing up (vomiting), or become sweaty.  You notice discoloration of your toes or legs, or your feet get very cold. MAKE SURE YOU:   Understand these instructions.  Will watch your condition.  Will get help right away if you are not doing well or get worse. Document Released: 04/01/2005 Document Revised: 06/27/2013 Document Reviewed: 02/08/2013 [facility] Patient Information 2015 [place], [place]. This information is not intended to replace advice given to you by your health care provider. Make sure you discuss any questions you have with your health care provider.   Do not drive within 4 hours of taking oxycodone as this will make you drowsy.  Avoid lifting,  Bending,  Twisting or any other activity that worsens your pain over the next week.  Apply an  icepack  to your lower back for 10-15 minutes every 2 hours for the next 2 days.  You should get rechecked if your symptoms are not better over the next 5 days,  Or you develop increased pain,  Weakness in your leg(s) or loss  of bladder or bowel function - these are symptoms of a worse injury.   Continue taking your home flexeril.

## 2014-08-01 NOTE — ED Notes (Signed)
Fall last night.  C/o lower back pain.

## 2014-08-02 DIAGNOSIS — E785 Hyperlipidemia, unspecified: Secondary | ICD-10-CM | POA: Diagnosis not present

## 2014-08-02 DIAGNOSIS — M5432 Sciatica, left side: Secondary | ICD-10-CM | POA: Diagnosis not present

## 2014-08-02 DIAGNOSIS — J449 Chronic obstructive pulmonary disease, unspecified: Secondary | ICD-10-CM | POA: Diagnosis not present

## 2014-08-02 DIAGNOSIS — E039 Hypothyroidism, unspecified: Secondary | ICD-10-CM | POA: Diagnosis not present

## 2014-08-03 NOTE — ED Provider Notes (Signed)
CSN: [id number]     Arrival date & time 08/01/14  1645 History   First MD Initiated Contact with Patient 08/01/14 1731     Chief Complaint  Patient presents with  . Fall     (Consider location/radiation/quality/duration/timing/severity/associated sxs/prior Treatment) HPI  Kristen Ryan is a 59 y.o. female with a history of chronic intermittent sciatica presenting with flair up of this low back pain which radiates into the left lower extremity since tripped over her sons laundry basket last night, landing onto hard flooring.  She denies other injury, did not hit her head, no loc.  Her low back pain with radiation is similar to prior sciatica flares.  She denies weakness or numbness in her legs and denies urinary or fecal incontinence or retention.  She has taken flexeril (takes chronically for her back) but no other medicines prior to arrival here.    Past Medical History  Diagnosis Date  . Asthma   . Bipolar 1 disorder   . Schizophrenia   . Neuropathy   . Bronchitis   . Thyroid disease    Past Surgical History  Procedure Laterality Date  . Abdominal hysterectomy    . Tonsillectomy    . Knee surgery    . Elbow surgery    . Foot surgery    . Cesarean section    . Broke left arm     Family History  Problem Relation Age of Onset  . Dementia Mother   . Dementia Father   . Other Son     MVA accident   History  Substance Use Topics  . Smoking status: Former Smoker    Types: Cigarettes  . Smokeless tobacco: Never Used  . Alcohol Use: No   OB History    Gravida Para Term Preterm AB TAB SAB Ectopic Multiple Living   1 1        1      Review of Systems  Constitutional: Negative for fever.  Respiratory: Negative for shortness of breath.   Cardiovascular: Negative for chest pain and leg swelling.  Gastrointestinal: Negative for abdominal pain, constipation and abdominal distention.  Genitourinary: Negative for dysuria, urgency, frequency, flank pain and difficulty  urinating.  Musculoskeletal: Positive for back pain. Negative for joint swelling and gait problem.  Skin: Negative for rash.  Neurological: Negative for weakness and numbness.      Allergies  Aminophylline; Sumatriptan; Theophylline; Codeine; Depakote; Divalproex sodium; Doxycycline; Lamictal; Magnesium-containing compounds; Penicillins; Pentazocine lactate; Risperidone; Tetracyclines & related; Vistaril; Tetracycline; and Zyprexa  Home Medications   Prior to Admission medications   Medication Sig Start Date End Date Taking? Authorizing Provider  Albuterol Sulfate (PROVENTIL HFA IN) Inhale into the lungs as needed.    Historical Provider, MD  ALPRAZolam Prudy Feeler(XANAX) 1 MG tablet Take 1 mg by mouth 3 (three) times daily as needed for sleep or anxiety.     Historical Provider, MD  aspirin 325 MG tablet Take 325 mg by mouth daily.    Historical Provider, MD  budesonide-formoterol (SYMBICORT) 160-4.5 MCG/ACT inhaler Inhale 2 puffs into the lungs 2 (two) times daily. 03/25/14   Toy BakerAnthony T Allen, MD  cyclobenzaprine (FLEXERIL) 10 MG tablet Take 10 mg by mouth every 4 (four) hours.    Historical Provider, MD  gabapentin (NEURONTIN) 300 MG capsule Take 300 mg by mouth 2 (two) times daily.    Historical Provider, MD  levothyroxine (SYNTHROID, LEVOTHROID) 50 MCG tablet Take 50 mcg by mouth daily.    Historical Provider, MD  loratadine (ALLERGY RELIEF) 10 MG tablet Take 1 mg by mouth daily.    Historical Provider, MD  oxyCODONE-acetaminophen (PERCOCET/ROXICET) 5-325 MG per tablet Take 1 tablet by mouth every 4 (four) hours as needed. 08/01/14   Burgess Amor, PA-C  polyethylene glycol-electrolytes (NULYTELY/GOLYTELY) 420 G solution Take 4,000 mLs by mouth once. 07/20/14   Len Blalock, NP  sodium chloride (OCEAN) 0.65 % SOLN nasal spray Place 1 spray into both nostrils as needed for congestion.    Historical Provider, MD   BP 111/63 mmHg  Pulse 87  Temp(Src) 98.6 F (37 C) (Oral)  Resp 18  Ht  (1.702  m)  Wt 155 lb (70.308 kg)  BMI 24.27 kg/m2  SpO2 96% Physical Exam  Constitutional: She appears well-developed and well-nourished.  HENT:  Head: Normocephalic.  Eyes: Conjunctivae are normal.  Neck: Normal range of motion. Neck supple.  Cardiovascular: Normal rate and intact distal pulses.   Pedal pulses normal.  Pulmonary/Chest: Effort normal.  Abdominal: Soft. Bowel sounds are normal. She exhibits no distension and no mass.  Musculoskeletal: Normal range of motion. She exhibits no edema.       Lumbar back: She exhibits tenderness. She exhibits no swelling, no edema and no spasm.  ttp left paralumbar, no midline lumbar pain or deformity.  Neurological: She is alert. She has normal strength. She displays no atrophy and no tremor. No sensory deficit. Gait normal.  Reflex Scores:      Patellar reflexes are 2+ on the right side and 2+ on the left side.      Achilles reflexes are 2+ on the right side and 2+ on the left side. No strength deficit noted in hip and knee flexor and extensor muscle groups.  Ankle flexion and extension intact.  Skin: Skin is warm and dry.  Psychiatric: She has a normal mood and affect.  Nursing note and vitals reviewed.   ED Course  Procedures (including critical care time) Labs Review Labs Reviewed - No data to display  Imaging Review Dg Lumbar Spine Complete  08/01/2014   CLINICAL DATA:  Lumbar pain with radiation into bilateral lower extremities after a fall last night.  EXAM: LUMBAR SPINE - COMPLETE 4+ VIEW  COMPARISON:  03/12/2014.  FINDINGS: Mild levoconvex curvature. Alignment is otherwise anatomic. No definite pars defects. Vertebral body height is maintained. Very minimal endplate degenerative change in the mid and lower lumbar spine. Facet hypertrophy at L4-5 and L5-S1. There is loss of disc space height at L5-S1, especially posteriorly.  IMPRESSION: 1. No fracture or subluxation. 2. Mild spondylosis.   Electronically Signed   By: Leanna Battles  M.D.   On: 08/01/2014 18:21     EKG Interpretation None      MDM   Final diagnoses:  Sciatica, left    No neuro deficit on exam or by history to suggest emergent or surgical presentation.  Also discussed worsened sx that should prompt immediate re-evaluation including distal weakness, bowel/bladder retention/incontinence.   Patients labs and/or radiological studies were viewed and considered during the medical decision making and disposition process. Pt encouraged activity as tolerated, ice, oxycodone prescribed. F/u with pcp prn if not improved over the next 5 days with tx.     Burgess Amor, PA-C 08/03/14 1301  Benny Lennert, MD 08/21/14 [phone]

## 2014-08-16 ENCOUNTER — Ambulatory Visit (HOSPITAL_COMMUNITY)
Admission: RE | Admit: 2014-08-16 | Discharge: 2014-08-16 | Disposition: A | Discharge status: Home / Self Care | Payer: Medicare Other | Source: Ambulatory Visit | Attending: Internal Medicine | Admitting: Internal Medicine

## 2014-08-16 ENCOUNTER — Other Ambulatory Visit (HOSPITAL_COMMUNITY): Payer: Self-pay | Admitting: Internal Medicine

## 2014-08-16 DIAGNOSIS — M545 Low back pain, unspecified: Secondary | ICD-10-CM

## 2014-08-16 DIAGNOSIS — M5136 Other intervertebral disc degeneration, lumbar region: Secondary | ICD-10-CM | POA: Insufficient documentation

## 2014-08-16 DIAGNOSIS — M549 Dorsalgia, unspecified: Secondary | ICD-10-CM | POA: Diagnosis not present

## 2014-08-16 DIAGNOSIS — R202 Paresthesia of skin: Secondary | ICD-10-CM | POA: Diagnosis not present

## 2014-08-21 ENCOUNTER — Encounter: Payer: Self-pay | Admitting: Neurology

## 2014-08-21 ENCOUNTER — Encounter: Payer: Self-pay | Admitting: *Deleted

## 2014-08-21 ENCOUNTER — Ambulatory Visit (INDEPENDENT_AMBULATORY_CARE_PROVIDER_SITE_OTHER): Payer: Medicare Other | Admitting: Neurology

## 2014-08-21 VITALS — BP 127/79 | HR 100 | Ht 67.0 in | Wt 148.0 lb

## 2014-08-21 DIAGNOSIS — R3915 Urgency of urination: Secondary | ICD-10-CM | POA: Diagnosis not present

## 2014-08-21 DIAGNOSIS — R269 Unspecified abnormalities of gait and mobility: Secondary | ICD-10-CM

## 2014-08-21 DIAGNOSIS — M546 Pain in thoracic spine: Secondary | ICD-10-CM | POA: Diagnosis not present

## 2014-08-21 NOTE — Progress Notes (Signed)
PATIENT: Kristen Ryan DOB: 05-08-1956  HISTORICAL  Kristen Ryan is a 59 years old female, accompanied by her son, referred by her primary care physician Dr. Felecia Shelling for evaluation of low back pain, radiating pain to left leg   She had a history of bipolar disorder, she had a history of thoracic fracture in 1992, she fell down steps, she has to wear a rigid thoracic brace for 2 months, but no surgery is required.  Over the years, she has gradually deformity of her spine, intermittent flareup of her upper back pain, since January 2016, she had worsening upper back pain, from her bra strap down, radiating along her spine, getting worse bearing weight, no bowel and bladder incontinence. She has been taking by her family to local emergency room multiple times, for different complaints, chest pain, upper back pain, bilateral lower extremity muscle cramps, low back pain, radiating pain to left lower extremity.  X-ray of lumbar showed mild degenerative joint disease She has some gait difficulty, attributed to her right foot skin abrasion, using a cane now, she denies bowel and bladder    REVIEW OF SYSTEMS: Full 14 system review of systems performed and notable only for as above  ALLERGIES: Allergies  Allergen Reactions  . Aminophylline Anaphylaxis  . Sumatriptan Anaphylaxis and Other (See Comments)    Causes fainting  . Theophylline Anaphylaxis  . Codeine Itching and Other (See Comments)    Too strong for patient   . Depakote [Divalproex Sodium] Other (See Comments)    Hair loss  . Divalproex Sodium Other (See Comments)    Causes hair to fall out  . Doxycycline Other (See Comments)    headache  . Lamictal [Lamotrigine]     Destroys thyroid  . Magnesium-Containing Compounds Hives  . Penicillins Nausea And Vomiting  . Pentazocine Lactate Other (See Comments)    Unknown reaction  . Risperidone Other (See Comments)    Insomnia   . Tetracyclines & Related   . Vistaril  [Hydroxyzine Hcl] Nausea And Vomiting  . Tetracycline Rash    Headaches  . Zyprexa [Olanzapine] Rash and Other (See Comments)    Insomnia    HOME MEDICATIONS: Current Outpatient Prescriptions  Medication Sig Dispense Refill  . Albuterol Sulfate (PROVENTIL HFA IN) Inhale 1-2 puffs into the lungs as needed.     . ALPRAZolam (XANAX) 1 MG tablet Take 1 mg by mouth 3 (three) times daily as needed for sleep or anxiety.     Marland Kitchen aspirin 325 MG tablet Take 325 mg by mouth daily.    . budesonide-formoterol (SYMBICORT) 160-4.5 MCG/ACT inhaler Inhale 2 puffs into the lungs 2 (two) times daily. 1 Inhaler 1  . cyclobenzaprine (FLEXERIL) 10 MG tablet Take 10 mg by mouth 3 (three) times daily as needed for muscle spasms.    Marland Kitchen gabapentin (NEURONTIN) 300 MG capsule Take 300 mg by mouth 2 (two) times daily.    Marland Kitchen ibuprofen (ADVIL,MOTRIN) 200 MG tablet Take 200 mg by mouth every 6 (six) hours as needed.    Marland Kitchen levothyroxine (SYNTHROID, LEVOTHROID) 50 MCG tablet Take 50 mcg by mouth daily.    Marland Kitchen loratadine (ALLERGY RELIEF) 10 MG tablet Take 1 mg by mouth daily.    . polyethylene glycol-electrolytes (NULYTELY/GOLYTELY) 420 G solution Take 4,000 mLs by mouth once. 4000 mL 0  . sodium chloride (OCEAN) 0.65 % SOLN nasal spray Place 1 spray into both nostrils as needed for congestion.     No current facility-administered medications for  this visit.    PAST MEDICAL HISTORY: Past Medical History  Diagnosis Date  . Asthma   . Bipolar 1 disorder   . Schizophrenia   . Neuropathy   . Bronchitis   . Thyroid disease   . COPD (chronic obstructive pulmonary disease)     PAST SURGICAL HISTORY: Past Surgical History  Procedure Laterality Date  . Abdominal hysterectomy    . Tonsillectomy    . Knee surgery    . Elbow surgery    . Foot surgery    . Cesarean section    . Broke left arm      FAMILY HISTORY: Family History  Problem Relation Age of Onset  . Dementia Mother   . Dementia Father   . Other Son      MVA accident    SOCIAL HISTORY:  History   Social History  . Marital Status: Married    Spouse Name: N/A  . Number of Children: 1  . Years of Education: N/A   Occupational History  . Disabled   Social History Main Topics  . Smoking status: Former Smoker    Types: Cigarettes  . Smokeless tobacco: Never Used  . Alcohol Use: No  . Drug Use: No  . Sexual Activity: Yes    Birth Control/ Protection: Surgical   Other Topics Concern  . Not on file    PHYSICAL EXAM   Filed Vitals:   08/21/14 1535  BP: 127/79  Pulse: 100  Height:  (1.702 m)  Weight: 148 lb (67.132 kg)    Not recorded      Body mass index is 23.17 kg/(m^2).   Generalized: In no acute distress  Neck: Supple, no carotid bruits   Cardiac: Regular rate rhythm  Pulmonary: Clear to auscultation bilaterally  Musculoskeletal: Mild kyphosis, tenderness along bilateral thoracic and lumbar paraspinal muscles.   Neurological examination  Mentation: Alert oriented to time, place, history taking, and causual conversation  Cranial nerve II-XII: Pupils were equal round reactive to light. Extraocular movements were full.  Visual field were full on confrontational test. Bilateral fundi were sharp.  Facial sensation and strength were normal. Hearing was intact to finger rubbing bilaterally. Uvula tongue midline.  Head turning and shoulder shrug and were normal and symmetric.Tongue protrusion into cheek strength was normal.  Motor: Normal tone, bulk and strength.  Sensory: Intact to fine touch, pinprick, preserved vibratory sensation, and proprioception at toes.  Coordination: Normal finger to nose, heel-to-shin bilaterally there was no truncal ataxia  Gait: Rising up from seated position by pushing on chair arm, antalgic gait due to right foot pain  Romberg signs: Negative  Deep tendon reflexes: Brachioradialis 2/2, biceps 2/2, triceps 2/2, patellar 2/2, Achilles 2/2, plantar responses were flexor  bilaterally.   DIAGNOSTIC DATA (LABS, IMAGING, TESTING) - I reviewed patient records, labs, notes, testing and imaging myself where available.  Lab Results  Component Value Date   WBC 9.9 04/03/2014   HGB 11.7* 04/03/2014   HCT 36.8 04/03/2014   MCV 87.4 04/03/2014   PLT 215 04/03/2014      Component Value Date/Time   NA 140 06/13/2014 1413   K 4.4 06/13/2014 1413   CL 102 06/13/2014 1413   CO2 29 06/13/2014 1413   GLUCOSE 60* 06/13/2014 1413   BUN 15 06/13/2014 1413   CREATININE 0.76 06/13/2014 1413   CREATININE 1.19* 05/02/2014 2032   CALCIUM 9.4 06/13/2014 1413   PROT 6.3 06/13/2014 1413   ALBUMIN 3.9 06/13/2014 1413  AST 24 06/13/2014 1413   ALT 20 06/13/2014 1413   ALKPHOS 116 06/13/2014 1413   BILITOT 0.2 06/13/2014 1413   GFRNONAA 49* 05/02/2014 2032   GFRAA 57* 05/02/2014 2032   No results found for: CHOL, HDL, LDLCALC, LDLDIRECT, TRIG, CHOLHDL No results found for: IONG2XHGBA1C No results found for: VITAMINB12 Lab Results  Component Value Date   TSH 0.888 07/07/2010      ASSESSMENT AND PLAN  Kristen Ryan is a 59 y.o. female  With past medical history of thoracic fracture, presenting with worsening upper back pain, multiple ER visit because of the pain, gait difficulty, which is multifactorial, including low back pain, right foot pain,  Complete evaluation with MRI thoracic spine, Return to clinic after above imaging study  Orders Placed This Encounter  Procedures  . MR Thoracic Spine Wo Contrast    New Prescriptions   No medications on file    Medications Discontinued During This Encounter  Medication Reason  . cyclobenzaprine (FLEXERIL) 10 MG tablet Dose change  . oxyCODONE-acetaminophen (PERCOCET/ROXICET) 5-325 MG per tablet Discontinued by provider    Return in about 2 weeks (around 09/04/2014). Levert FeinsteinYijun Alana Dayton, M.D. Ph.D.  [facility] [address], [address] [place], [place] [address] Ph: [phone] Fax: [phone]

## 2014-08-24 ENCOUNTER — Encounter (HOSPITAL_COMMUNITY): Payer: Self-pay | Admitting: *Deleted

## 2014-08-24 ENCOUNTER — Emergency Department (HOSPITAL_COMMUNITY)
Admission: EM | Admit: 2014-08-24 | Discharge: 2014-08-24 | Disposition: A | Discharge status: Home / Self Care | Payer: Medicare Other | Attending: Emergency Medicine | Admitting: Emergency Medicine

## 2014-08-24 DIAGNOSIS — F319 Bipolar disorder, unspecified: Secondary | ICD-10-CM | POA: Insufficient documentation

## 2014-08-24 DIAGNOSIS — J441 Chronic obstructive pulmonary disease with (acute) exacerbation: Secondary | ICD-10-CM | POA: Insufficient documentation

## 2014-08-24 DIAGNOSIS — M5416 Radiculopathy, lumbar region: Secondary | ICD-10-CM | POA: Insufficient documentation

## 2014-08-24 DIAGNOSIS — G629 Polyneuropathy, unspecified: Secondary | ICD-10-CM | POA: Insufficient documentation

## 2014-08-24 DIAGNOSIS — Z7951 Long term (current) use of inhaled steroids: Secondary | ICD-10-CM | POA: Diagnosis not present

## 2014-08-24 DIAGNOSIS — E079 Disorder of thyroid, unspecified: Secondary | ICD-10-CM | POA: Insufficient documentation

## 2014-08-24 DIAGNOSIS — Z79899 Other long term (current) drug therapy: Secondary | ICD-10-CM | POA: Insufficient documentation

## 2014-08-24 DIAGNOSIS — F209 Schizophrenia, unspecified: Secondary | ICD-10-CM | POA: Insufficient documentation

## 2014-08-24 DIAGNOSIS — M545 Low back pain: Secondary | ICD-10-CM | POA: Diagnosis present

## 2014-08-24 DIAGNOSIS — Z7982 Long term (current) use of aspirin: Secondary | ICD-10-CM | POA: Insufficient documentation

## 2014-08-24 DIAGNOSIS — R200 Anesthesia of skin: Secondary | ICD-10-CM | POA: Diagnosis not present

## 2014-08-24 DIAGNOSIS — Z87891 Personal history of nicotine dependence: Secondary | ICD-10-CM | POA: Diagnosis not present

## 2014-08-24 DIAGNOSIS — Z880 Allergy status to penicillin: Secondary | ICD-10-CM | POA: Insufficient documentation

## 2014-08-24 NOTE — ED Notes (Signed)
Patient with no complaints at this time. Respirations even and unlabored. Skin warm/dry. Discharge instructions reviewed with patient at this time. Patient given opportunity to voice concerns/ask questions. Patient discharged at this time and left Emergency Department with steady gait.   

## 2014-08-24 NOTE — ED Notes (Addendum)
Low back pain with numbness in legs for last 2 days, Walking with a cane.  Plans MRI on 2/26  Seen by neurologist on 2/16

## 2014-08-24 NOTE — Discharge Instructions (Signed)
Your examination is consistent with radiculopathy probably related to the lumbar area. Please see your physician for your MRI as scheduled. Please return to the emergency department if any loss of control of bowel or bladder, foot drop, frequent falls, or changes in your general condition. Lumbosacral Radiculopathy Lumbosacral radiculopathy is a pinched nerve or nerves in the low back (lumbosacral area). When this happens you may have weakness in your legs and may not be able to stand on your toes. You may have pain going down into your legs. There may be difficulties with walking normally. There are many causes of this problem. Sometimes this may happen from an injury, or simply from arthritis or boney problems. It may also be caused by other illnesses such as diabetes. If there is no improvement after treatment, further studies may be done to find the exact cause. DIAGNOSIS  X-rays may be needed if the problems become long standing. Electromyograms may be done. This study is one in which the working of nerves and muscles is studied. HOME CARE INSTRUCTIONS   Applications of ice packs may be helpful. Ice can be used in a plastic bag with a towel around it to prevent frostbite to skin. This may be used every 2 hours for 20 to 30 minutes, or as needed, while awake, or as directed by your caregiver.  Only take over-the-counter or prescription medicines for pain, discomfort, or fever as directed by your caregiver.  If physical therapy was prescribed, follow your caregiver's directions. SEEK IMMEDIATE MEDICAL CARE IF:   You have pain not controlled with medications.  You seem to be getting worse rather than better.  You develop increasing weakness in your legs.  You develop loss of bowel or bladder control.  You have difficulty with walking or balance, or develop clumsiness in the use of your legs.  You have a fever. MAKE SURE YOU:   Understand these instructions.  Will watch your  condition.  Will get help right away if you are not doing well or get worse. Document Released: 06/22/2005 Document Revised: 09/14/2011 Document Reviewed: 02/10/2008 [facility] Patient Information 2015 [place]. This information is not intended to replace advice given to you by your health care provider. Make sure you discuss any questions you have with your health care provider. She radiation radiation radiation and she is associated

## 2014-08-24 NOTE — ED Provider Notes (Signed)
CSN: [id number]     Arrival date & time 08/24/14  1548 History   First MD Initiated Contact with Patient 08/24/14 1619     Chief Complaint  Patient presents with  . Back Pain     (Consider location/radiation/quality/duration/timing/severity/associated sxs/prior Treatment) HPI Comments: Patient is a 59 year old female who presents to the emergency department with a complaint of numbness in her legs. The patient states that for the past 2 days she's been noticing some numbing sensation and pins and needles sensation in both of her lower legs. It is of note that she has been told she has sciatica and degenerative disc disease. She is scheduled for a MRI on February 26. She states that the numbness and tingling has been new over the last 2 days, she became concerned, and came to the emergency department for additional evaluation. There's been no loss of bowel or bladder function. There's been no increase falls. Nothing seems to make the pain better, certain movements make the pain worse.  Patient is a 59 y.o. female presenting with back pain. The history is provided by the patient.  Back Pain Location:  Lumbar spine Associated symptoms: numbness   Associated symptoms: no abdominal pain, no chest pain and no dysuria     Past Medical History  Diagnosis Date  . Asthma   . Bipolar 1 disorder   . Schizophrenia   . Neuropathy   . Bronchitis   . Thyroid disease   . COPD (chronic obstructive pulmonary disease)    Past Surgical History  Procedure Laterality Date  . Abdominal hysterectomy    . Tonsillectomy    . Knee surgery    . Elbow surgery    . Foot surgery    . Cesarean section    . Broke left arm     Family History  Problem Relation Age of Onset  . Dementia Mother   . Dementia Father   . Other Son     MVA accident   History  Substance Use Topics  . Smoking status: Former Smoker    Types: Cigarettes  . Smokeless tobacco: Never Used  . Alcohol Use: No   OB History    Gravida Para Term Preterm AB TAB SAB Ectopic Multiple Living   Review of Systems  Constitutional: Negative for activity change.       All ROS Neg except as noted in HPI  HENT: Negative for nosebleeds.   Eyes: Negative for photophobia and discharge.  Respiratory: Negative for cough, shortness of breath and wheezing.   Cardiovascular: Negative for chest pain and palpitations.  Gastrointestinal: Negative for abdominal pain and blood in stool.  Genitourinary: Negative for dysuria, frequency and hematuria.  Musculoskeletal: Positive for back pain. Negative for arthralgias and neck pain.  Skin: Negative.   Neurological: Positive for numbness. Negative for dizziness, seizures and speech difficulty.  Psychiatric/Behavioral: Negative for hallucinations and confusion.      Allergies  Aminophylline; Sumatriptan; Theophylline; Codeine; Depakote; Divalproex sodium; Doxycycline; Lamictal; Magnesium-containing compounds; Penicillins; Pentazocine lactate; Risperidone; Tetracyclines & related; Vistaril; Tetracycline; and Zyprexa  Home Medications   Prior to Admission medications   Medication Sig Start Date End Date Taking? Authorizing Provider  Albuterol Sulfate (PROVENTIL HFA IN) Inhale 1-2 puffs into the lungs as needed.     Historical Provider, MD  ALPRAZolam Prudy Feeler) 1 MG tablet Take 1 mg by mouth 3 (three) times daily as needed for sleep or anxiety.  Historical Provider, MD  aspirin 325 MG tablet Take 325 mg by mouth daily.    Historical Provider, MD  budesonide-formoterol (SYMBICORT) 160-4.5 MCG/ACT inhaler Inhale 2 puffs into the lungs 2 (two) times daily. 03/25/14   Toy Baker, MD  cyclobenzaprine (FLEXERIL) 10 MG tablet Take 10 mg by mouth 3 (three) times daily as needed for muscle spasms.    Historical Provider, MD  gabapentin (NEURONTIN) 300 MG capsule Take 300 mg by mouth 2 (two) times daily.    Historical Provider, MD  ibuprofen (ADVIL,MOTRIN) 200 MG tablet Take  200 mg by mouth every 6 (six) hours as needed.    Historical Provider, MD  levothyroxine (SYNTHROID, LEVOTHROID) 50 MCG tablet Take 50 mcg by mouth daily.    Historical Provider, MD  loratadine (ALLERGY RELIEF) 10 MG tablet Take 1 mg by mouth daily.    Historical Provider, MD  polyethylene glycol-electrolytes (NULYTELY/GOLYTELY) 420 G solution Take 4,000 mLs by mouth once. 07/20/14   Len Blalock, NP  sodium chloride (OCEAN) 0.65 % SOLN nasal spray Place 1 spray into both nostrils as needed for congestion.    Historical Provider, MD   BP 112/64 mmHg  Pulse 89  Temp(Src) 99.6 F (37.6 C) (Oral)  Resp 18  Ht 5' 7.5" (1.715 m)  Wt 149 lb (67.586 kg)  BMI 22.98 kg/m2  SpO2 100% Physical Exam  Constitutional: She is oriented to person, place, and time. She appears well-developed and well-nourished.  Non-toxic appearance.  HENT:  Head: Normocephalic.  Right Ear: Tympanic membrane and external ear normal.  Left Ear: Tympanic membrane and external ear normal.  Eyes: EOM and lids are normal. Pupils are equal, round, and reactive to light.  Neck: Normal range of motion. Neck supple. Carotid bruit is not present.  Cardiovascular: Normal rate, regular rhythm, normal heart sounds, intact distal pulses and normal pulses.   Pulmonary/Chest: Breath sounds normal. No respiratory distress.  Abdominal: Soft. Bowel sounds are normal. There is no tenderness. There is no guarding.  Musculoskeletal:       Lumbar back: She exhibits decreased range of motion and tenderness.  No motor deficit. No foot drop. Walks with a cane,but this is not new.  Lymphadenopathy:       Head (right side): No submandibular adenopathy present.       Head (left side): No submandibular adenopathy present.    She has no cervical adenopathy.  Neurological: She is alert and oriented to person, place, and time. She has normal strength. No cranial nerve deficit. Coordination normal. GCS eye subscore is 4. GCS verbal subscore is 5. GCS  motor subscore is 6.  Pt states that touch of the lower extremities from the knee to the foot feels like pressure.  Skin: Skin is warm and dry.  Psychiatric: She has a normal mood and affect. Her speech is normal.  Nursing note and vitals reviewed.   ED Course  Procedures (including critical care time) Labs Review Labs Reviewed - No data to display  Imaging Review No results found.   EKG Interpretation None      MDM  Vital signs within normal limits. Pulse oximetry is 100% on room air. Within normal limits by my interpretation. Patient is an amateur a mostly with a cane, but can ambulate without the cane. There is no evidence of foot drop. There are some mild sensory changes of the lower extremities. There's been no loss of bowel or bladder function, no increased falls. Suspect that this is an exacerbation  of her sciatica. Patient is scheduled for MRI on April 26. Patient is strongly advised to keep her appointment for the MRI. She is further advised to return to the emergency department if any additional changes, problems, or concerns.    Final diagnoses:  None    **I have reviewed nursing notes, vital signs, and all appropriate lab and imaging results for this patient.Kathie Dike*    Chauncey Bruno M Star Cheese, PA-C 08/24/14 1644  Benny LennertJoseph L Zammit, MD 08/24/14 2209

## 2014-08-30 ENCOUNTER — Encounter (HOSPITAL_COMMUNITY)
Admission: RE | Disposition: A | Discharge status: Home / Self Care | Payer: Self-pay | Source: Ambulatory Visit | Attending: Internal Medicine

## 2014-08-30 ENCOUNTER — Telehealth (INDEPENDENT_AMBULATORY_CARE_PROVIDER_SITE_OTHER): Payer: Self-pay | Admitting: *Deleted

## 2014-08-30 ENCOUNTER — Encounter (HOSPITAL_COMMUNITY): Payer: Self-pay | Admitting: *Deleted

## 2014-08-30 ENCOUNTER — Ambulatory Visit (HOSPITAL_COMMUNITY)
Admission: RE | Admit: 2014-08-30 | Discharge: 2014-08-30 | Disposition: A | Discharge status: Home / Self Care | Payer: Medicare Other | Source: Ambulatory Visit | Attending: Internal Medicine | Admitting: Internal Medicine

## 2014-08-30 DIAGNOSIS — K5730 Diverticulosis of large intestine without perforation or abscess without bleeding: Secondary | ICD-10-CM | POA: Insufficient documentation

## 2014-08-30 DIAGNOSIS — F209 Schizophrenia, unspecified: Secondary | ICD-10-CM | POA: Insufficient documentation

## 2014-08-30 DIAGNOSIS — F319 Bipolar disorder, unspecified: Secondary | ICD-10-CM | POA: Insufficient documentation

## 2014-08-30 DIAGNOSIS — J45909 Unspecified asthma, uncomplicated: Secondary | ICD-10-CM | POA: Insufficient documentation

## 2014-08-30 DIAGNOSIS — G629 Polyneuropathy, unspecified: Secondary | ICD-10-CM | POA: Insufficient documentation

## 2014-08-30 DIAGNOSIS — Z79899 Other long term (current) drug therapy: Secondary | ICD-10-CM | POA: Insufficient documentation

## 2014-08-30 DIAGNOSIS — Z1211 Encounter for screening for malignant neoplasm of colon: Secondary | ICD-10-CM | POA: Insufficient documentation

## 2014-08-30 DIAGNOSIS — E079 Disorder of thyroid, unspecified: Secondary | ICD-10-CM | POA: Diagnosis not present

## 2014-08-30 DIAGNOSIS — J449 Chronic obstructive pulmonary disease, unspecified: Secondary | ICD-10-CM | POA: Insufficient documentation

## 2014-08-30 DIAGNOSIS — K644 Residual hemorrhoidal skin tags: Secondary | ICD-10-CM | POA: Insufficient documentation

## 2014-08-30 DIAGNOSIS — Z87891 Personal history of nicotine dependence: Secondary | ICD-10-CM | POA: Insufficient documentation

## 2014-08-30 DIAGNOSIS — K648 Other hemorrhoids: Secondary | ICD-10-CM | POA: Diagnosis not present

## 2014-08-30 DIAGNOSIS — Z791 Long term (current) use of non-steroidal anti-inflammatories (NSAID): Secondary | ICD-10-CM | POA: Diagnosis not present

## 2014-08-30 DIAGNOSIS — Z7982 Long term (current) use of aspirin: Secondary | ICD-10-CM | POA: Diagnosis not present

## 2014-08-30 HISTORY — PX: COLONOSCOPY: SHX5424

## 2014-08-30 SURGERY — COLONOSCOPY
Anesthesia: Moderate Sedation

## 2014-08-30 MED ORDER — DICYCLOMINE HCL 10 MG PO CAPS: 10.0000 mg | ORAL_CAPSULE | Freq: Three times a day (TID) | ORAL | Status: DC | PRN

## 2014-08-30 MED ORDER — MIDAZOLAM HCL 5 MG/5ML IJ SOLN
INTRAMUSCULAR | Status: AC
  Filled 2014-08-30: qty 10

## 2014-08-30 MED ORDER — MEPERIDINE HCL 50 MG/ML IJ SOLN
INTRAMUSCULAR | Status: AC
  Filled 2014-08-30: qty 1

## 2014-08-30 MED ORDER — MIDAZOLAM HCL 5 MG/5ML IJ SOLN
INTRAMUSCULAR | Status: DC | PRN
  Administered 2014-08-30: 1 mg via INTRAVENOUS
  Administered 2014-08-30: 2 mg via INTRAVENOUS
  Administered 2014-08-30 (×2): 1 mg via INTRAVENOUS

## 2014-08-30 MED ORDER — SODIUM CHLORIDE 0.9 % IV SOLN
INTRAVENOUS | Status: DC
  Administered 2014-08-30: 11:00:00 via INTRAVENOUS

## 2014-08-30 MED ORDER — MEPERIDINE HCL 50 MG/ML IJ SOLN
INTRAMUSCULAR | Status: DC | PRN
  Administered 2014-08-30 (×2): 25 mg via INTRAVENOUS

## 2014-08-30 NOTE — H&P (Signed)
Kristen Ryan is an 59 y.o. female.   Chief Complaint: Patient is here for colonoscopy. HPI: Patient is 59 year old Caucasian female who is here for screening colonoscopy. She states she's been under a lot of stress on account for patient's illness and she is helping him. She has lost 11 pounds in 5 weeks. She says she's not eating 3 meals a day. She complains of intermittent diarrhea. She also gives history of intermittent hematochezia felt to be secondary to hemorrhoids. This is patient's first colonoscopy. Family history is negative for CRC.  Past Medical History  Diagnosis Date  . Asthma   . Bipolar 1 disorder   . Schizophrenia   . Neuropathy   . Bronchitis   . Thyroid disease   . COPD (chronic obstructive pulmonary disease)     Past Surgical History  Procedure Laterality Date  . Abdominal hysterectomy    . Tonsillectomy    . Knee surgery    . Elbow surgery    . Foot surgery    . Cesarean section    . Broke left arm      Family History  Problem Relation Age of Onset  . Dementia Mother   . Dementia Father   . Other Son     MVA accident   Social History:  reports that she has quit smoking. Her smoking use included Cigarettes. She has never used smokeless tobacco. She reports that she does not drink alcohol or use illicit drugs.  Allergies:  Allergies  Allergen Reactions  . Aminophylline Anaphylaxis  . Sumatriptan Anaphylaxis and Other (See Comments)    Causes fainting  . Theophylline Anaphylaxis  . Codeine Itching and Other (See Comments)    Too strong for patient   . Depakote [Divalproex Sodium] Other (See Comments)    Hair loss  . Divalproex Sodium Other (See Comments)    Causes hair to fall out  . Doxycycline Other (See Comments)    headache  . Lamictal [Lamotrigine]     Destroys thyroid  . Magnesium-Containing Compounds Hives  . Penicillins Nausea And Vomiting  . Pentazocine Lactate Other (See Comments)    Unknown reaction  . Risperidone Other (See  Comments)    Insomnia   . Tetracyclines & Related   . Vistaril [Hydroxyzine Hcl] Nausea And Vomiting  . Tetracycline Rash    Headaches  . Zyprexa [Olanzapine] Rash and Other (See Comments)    Insomnia    Medications Prior to Admission  Medication Sig Dispense Refill  . Albuterol Sulfate (PROVENTIL HFA IN) Inhale 1-2 puffs into the lungs as needed.     . ALPRAZolam (XANAX) 1 MG tablet Take 1 mg by mouth 3 (three) times daily as needed for sleep or anxiety.     Marland Kitchen. aspirin 325 MG tablet Take 325 mg by mouth daily.    . budesonide-formoterol (SYMBICORT) 160-4.5 MCG/ACT inhaler Inhale 2 puffs into the lungs 2 (two) times daily. 1 Inhaler 1  . cyclobenzaprine (FLEXERIL) 10 MG tablet Take 10 mg by mouth 3 (three) times daily as needed for muscle spasms.    Marland Kitchen. gabapentin (NEURONTIN) 300 MG capsule Take 300 mg by mouth 2 (two) times daily.    Marland Kitchen. ibuprofen (ADVIL,MOTRIN) 200 MG tablet Take 200 mg by mouth every 6 (six) hours as needed.    Marland Kitchen. levothyroxine (SYNTHROID, LEVOTHROID) 50 MCG tablet Take 50 mcg by mouth daily.    Marland Kitchen. loratadine (ALLERGY RELIEF) 10 MG tablet Take 1 mg by mouth daily.    . polyethylene  glycol-electrolytes (NULYTELY/GOLYTELY) 420 G solution Take 4,000 mLs by mouth once. 4000 mL 0  . sodium chloride (OCEAN) 0.65 % SOLN nasal spray Place 1 spray into both nostrils as needed for congestion.      No results found for this or any previous visit (from the past 48 hour(s)). No results found.  ROS  Blood pressure 117/70, pulse 92, temperature 98 F (36.7 C), temperature source Oral, resp. rate 18, height  (1.702 m), weight 149 lb (67.586 kg), SpO2 100 %. Physical Exam  Constitutional: She appears well-developed and well-nourished.  HENT:  Mouth/Throat: Oropharynx is clear and moist.  Eyes: Conjunctivae are normal. No scleral icterus.  Neck: No thyromegaly present.  Cardiovascular: Normal rate, regular rhythm and normal heart sounds.   No murmur heard. Respiratory: Effort  normal and breath sounds normal.  GI: Soft. She exhibits no distension. Tenderness: mild tenderness at LLQ.  Musculoskeletal: She exhibits no edema.  Lymphadenopathy:    She has no cervical adenopathy.  Neurological: She is alert.  Skin: Skin is warm and dry.     Assessment/Plan Average risk screening colonoscopy.  Pressley Barsky U 08/30/2014, 12:04 PM

## 2014-08-30 NOTE — Op Note (Signed)
COLONOSCOPY PROCEDURE REPORT  PATIENT:  Kristen Ryan  MR#:  [id number] Birthdate:  01-04-1956, 59 y.o., female Endoscopist:  Dr. Malissa HippoNajeeb U. Gurneet Matarese, MD Referred By:  Ms. Cyril MourningJennifer Griffin, NP  Procedure Date: 08/30/2014  Procedure:   Colonoscopy  Indications: Patient is 59 year old Caucasian female who is here for average risk screening colonoscopy. This is patient's first exam. Patient gives history of intermittent hematochezia which she believes is secondary to hemorrhoids. She also has had intermittent diarrhea and has lost 11 pounds in 5 weeks. She states she's been under a lot of stress on account of both parents illness. She has not been eating 3 meals a day.  Informed Consent:  The procedure and risks were reviewed with the patient and informed consent was obtained.  Medications:  Demerol 50 mg IV Versed 7 mg IV  Description of procedure:  After a digital rectal exam was performed, that colonoscope was advanced from the anus through the rectum and colon to the area of the cecum, ileocecal valve and appendiceal orifice. The cecum was deeply intubated. These structures were well-seen and photographed for the record. From the level of the cecum and ileocecal valve, the scope was slowly and cautiously withdrawn. The mucosal surfaces were carefully surveyed utilizing scope tip to flexion to facilitate fold flattening as needed. The scope was pulled down into the rectum where a thorough exam including retroflexion was performed.  Findings:   Prep fair to satisfactory. She a lot of thick liquid stool which had be washed and suctioned out. Moderate number of diverticula at sigmoid colon. Normal rectal mucosa. Small hemorrhoids below the dentate line.   Therapeutic/Diagnostic Maneuvers Performed:  None  Complications:  None  Cecal Withdrawal Time:  8 minutes  Impression:  Examination performed to cecum. Redundant colon with a moderate number of diverticula at sigmoid colon. Small  external hemorrhoids.  Recommendations:  Standard instructions given. High fiber diet. Dicyclomine 10 mg by mouth 3 times a day when necessary. Weight check in 4 weeks.  [name] U  08/30/2014 12:58 PM  CC: Dr. Avon GullyFANTA,TESFAYE, MD & Dr. Bonnetta BarryNo ref. provider found CC: Ms. Cyril MourningJennifer Griffin, NP

## 2014-08-30 NOTE — Discharge Instructions (Addendum)
Resume usual medications and high fiber diet. Dicyclomine 10 mg by mouth 3 times a day as needed for abdominal pain or cramps. No driving for 24 hours. Weight check in 4 weeks.   Colonoscopy, Care After These instructions give you information on caring for yourself after your procedure. Your doctor may also give you more specific instructions. Call your doctor if you have any problems or questions after your procedure. HOME CARE  Do not drive for 24 hours.  Do not sign important papers or use machinery for 24 hours.  You may shower.  You may go back to your usual activities, but go slower for the first 24 hours.  Take rest breaks often during the first 24 hours.  Walk around or use warm packs on your belly (abdomen) if you have belly cramping or gas.  Drink enough fluids to keep your pee (urine) clear or pale yellow.  Resume your normal diet. Avoid heavy or fried foods.  Avoid drinking alcohol for 24 hours or as told by your doctor.  Only take medicines as told by your doctor. If a tissue sample (biopsy) was taken during the procedure:   Do not take aspirin or blood thinners for 7 days, or as told by your doctor.  Do not drink alcohol for 7 days, or as told by your doctor.  Eat soft foods for the first 24 hours. GET HELP IF: You still have a small amount of blood in your poop (stool) 2-3 days after the procedure. GET HELP RIGHT AWAY IF:  You have more than a small amount of blood in your poop.  You see clumps of tissue (blood clots) in your poop.  Your belly is puffy (swollen).  You feel sick to your stomach (nauseous) or throw up (vomit).  You have a fever.  You have belly pain that gets worse and medicine does not help. MAKE SURE YOU:  Understand these instructions.  Will watch your condition.  Will get help right away if you are not doing well or get worse. Document Released: 07/25/2010 Document Revised: 06/27/2013 Document Reviewed: 02/27/2013 [facility]  Patient Information 2015 [place], [place]. This information is not intended to replace advice given to you by your health care provider. Make sure you discuss any questions you have with your health care provider.   High-Fiber Diet Fiber is found in fruits, vegetables, and grains. A high-fiber diet encourages the addition of more whole grains, legumes, fruits, and vegetables in your diet. The recommended amount of fiber for adult males is 38 g per day. For adult females, it is 25 g per day. Pregnant and lactating women should get 28 g of fiber per day. If you have a digestive or bowel problem, ask your caregiver for advice before adding high-fiber foods to your diet. Eat a variety of high-fiber foods instead of only a select few type of foods.  PURPOSE  To increase stool bulk.  To make bowel movements more regular to prevent constipation.  To lower cholesterol.  To prevent overeating. WHEN IS THIS DIET USED?  It may be used if you have constipation and hemorrhoids.  It may be used if you have uncomplicated diverticulosis (intestine condition) and irritable bowel syndrome.  It may be used if you need help with weight management.  It may be used if you want to add it to your diet as a protective measure against atherosclerosis, diabetes, and cancer. SOURCES OF FIBER  Whole-grain breads and cereals.  Fruits, such as apples, oranges, bananas,  berries, prunes, and pears.  Vegetables, such as green peas, carrots, sweet potatoes, beets, broccoli, cabbage, spinach, and artichokes.  Legumes, such split peas, soy, lentils.  Almonds. FIBER CONTENT IN FOODS Starches and Grains / Dietary Fiber (g)  Cheerios, 1 cup / 3 g  Corn Flakes cereal, 1 cup / 0.7 g  Rice crispy treat cereal, 1 cup / 0.3 g  Instant oatmeal (cooked),  cup / 2 g  Frosted wheat cereal, 1 cup / 5.1 g  Brown, long-grain rice (cooked), 1 cup / 3.5 g  White, long-grain rice (cooked), 1 cup / 0.6 g  Enriched  macaroni (cooked), 1 cup / 2.5 g Legumes / Dietary Fiber (g)  Baked beans (canned, plain, or vegetarian),  cup / 5.2 g  Kidney beans (canned),  cup / 6.8 g  Pinto beans (cooked),  cup / 5.5 g Breads and Crackers / Dietary Fiber (g)  Plain or honey graham crackers, 2 squares / 0.7 g  Saltine crackers, 3 squares / 0.3 g  Plain, salted pretzels, 10 pieces / 1.8 g  Whole-wheat bread, 1 slice / 1.9 g  White bread, 1 slice / 0.7 g  Raisin bread, 1 slice / 1.2 g  Plain bagel, 3 oz / 2 g  Flour tortilla, 1 oz / 0.9 g  Corn tortilla, 1 small / 1.5 g  Hamburger or hotdog bun, 1 small / 0.9 g Fruits / Dietary Fiber (g)  Apple with skin, 1 medium / 4.4 g  Sweetened applesauce,  cup / 1.5 g  Banana,  medium / 1.5 g  Grapes, 10 grapes / 0.4 g  Orange, 1 small / 2.3 g  Raisin, 1.5 oz / 1.6 g  Melon, 1 cup / 1.4 g Vegetables / Dietary Fiber (g)  Green beans (canned),  cup / 1.3 g  Carrots (cooked),  cup / 2.3 g  Broccoli (cooked),  cup / 2.8 g  Peas (cooked),  cup / 4.4 g  Mashed potatoes,  cup / 1.6 g  Lettuce, 1 cup / 0.5 g  Corn (canned),  cup / 1.6 g  Tomato,  cup / 1.1 g Document Released: 06/22/2005 Document Revised: 12/22/2011 Document Reviewed: 09/24/2011 ExitCare Patient Information 2015 [place], [place]. This information is not intended to replace advice given to you by your health care provider. Make sure you discuss any questions you have with your health care provider.

## 2014-08-30 NOTE — Telephone Encounter (Signed)
Referring MD/PCP: jennifer griffin, [facility]   Procedure: tcs  Reason/Indication:  screening  Has patient had this procedure before?  no  If so, when, by whom and where?    Is there a family history of colon cancer?  no  Who?  What age when diagnosed?    Is patient diabetic?   no      Does patient have prosthetic heart valve?  no  Do you have a pacemaker?  no  Has patient ever had endocarditis? no  Has patient had joint replacement within last 12 months?  no  Does patient tend to be constipated or take laxatives? no  Is patient on Coumadin, Plavix and/or Aspirin? yes  Medications: asa 325 mg daily, lipitor 40 mg daily, neurontin 600 mg bid, xanax 1 mg tid, flexeril 10 mg tid, synthroid 50 mcg daily, symbicort inhaler, pro-air inhaler, acetyl carnitine supplement 400 mg tid, all day allergy, nasal spray   Allergies: see epic  Medication Adjustment: asa 2 days  Procedure date & time: 08/30/14

## 2014-08-31 ENCOUNTER — Encounter (HOSPITAL_COMMUNITY): Payer: Self-pay | Admitting: Internal Medicine

## 2014-08-31 ENCOUNTER — Ambulatory Visit
Admission: RE | Admit: 2014-08-31 | Discharge: 2014-08-31 | Disposition: A | Discharge status: Home / Self Care | Payer: Medicare Other | Source: Ambulatory Visit | Attending: Neurology | Admitting: Neurology

## 2014-08-31 DIAGNOSIS — M546 Pain in thoracic spine: Secondary | ICD-10-CM

## 2014-08-31 DIAGNOSIS — R3915 Urgency of urination: Secondary | ICD-10-CM | POA: Diagnosis not present

## 2014-08-31 DIAGNOSIS — R269 Unspecified abnormalities of gait and mobility: Secondary | ICD-10-CM

## 2014-09-03 ENCOUNTER — Telehealth: Payer: Self-pay | Admitting: Neurology

## 2014-09-03 NOTE — Telephone Encounter (Signed)
Pt aware of results and expressed understanding.  She will keep her April f/u appt.

## 2014-09-03 NOTE — Telephone Encounter (Signed)
Kristen Ryan: Please call patient, MRI showed mild degenerative changes, at the thoracic T5-8, no evidence of compression,  I will explain in detail at her follow-up visit in April   Slightly abnormal MRI Thoraxic spine with mild disc and end plate degenerative changes from T 5-8 but without significant compression.

## 2014-09-16 ENCOUNTER — Encounter (HOSPITAL_COMMUNITY): Payer: Self-pay | Admitting: Emergency Medicine

## 2014-09-16 ENCOUNTER — Emergency Department (HOSPITAL_COMMUNITY)
Admission: EM | Admit: 2014-09-16 | Discharge: 2014-09-16 | Disposition: A | Discharge status: Home / Self Care | Payer: Medicare Other | Attending: Emergency Medicine | Admitting: Emergency Medicine

## 2014-09-16 DIAGNOSIS — Z880 Allergy status to penicillin: Secondary | ICD-10-CM | POA: Insufficient documentation

## 2014-09-16 DIAGNOSIS — Z7982 Long term (current) use of aspirin: Secondary | ICD-10-CM | POA: Insufficient documentation

## 2014-09-16 DIAGNOSIS — Z8669 Personal history of other diseases of the nervous system and sense organs: Secondary | ICD-10-CM | POA: Insufficient documentation

## 2014-09-16 DIAGNOSIS — M549 Dorsalgia, unspecified: Secondary | ICD-10-CM

## 2014-09-16 DIAGNOSIS — G8929 Other chronic pain: Secondary | ICD-10-CM | POA: Insufficient documentation

## 2014-09-16 DIAGNOSIS — J449 Chronic obstructive pulmonary disease, unspecified: Secondary | ICD-10-CM | POA: Diagnosis not present

## 2014-09-16 DIAGNOSIS — Z79899 Other long term (current) drug therapy: Secondary | ICD-10-CM | POA: Insufficient documentation

## 2014-09-16 DIAGNOSIS — Z87891 Personal history of nicotine dependence: Secondary | ICD-10-CM | POA: Insufficient documentation

## 2014-09-16 DIAGNOSIS — F319 Bipolar disorder, unspecified: Secondary | ICD-10-CM | POA: Diagnosis not present

## 2014-09-16 DIAGNOSIS — M542 Cervicalgia: Secondary | ICD-10-CM | POA: Insufficient documentation

## 2014-09-16 DIAGNOSIS — E079 Disorder of thyroid, unspecified: Secondary | ICD-10-CM | POA: Insufficient documentation

## 2014-09-16 DIAGNOSIS — M545 Low back pain: Secondary | ICD-10-CM | POA: Diagnosis not present

## 2014-09-16 MED ORDER — ACETAMINOPHEN 500 MG PO TABS
1000.0000 mg | ORAL_TABLET | Freq: Once | ORAL | Status: AC
  Administered 2014-09-16: 1000 mg via ORAL
  Filled 2014-09-16: qty 2

## 2014-09-16 NOTE — ED Provider Notes (Signed)
CSN: [id number]     Arrival date & time 09/16/14  2106 History  This chart was scribed for Doug SouSam Kinsler Soeder, MD by Abel PrestoKara Demonbreun, ED Scribe. This patient was seen in room [facility]07/[facility]07 and the patient's care was started at 10:30 PM.     Chief Complaint  Patient presents with  . Back Pain     Patient is a 59 y.o. female presenting with back pain. The history is provided by the patient. No language interpreter was used.  Back Pain  HPI Comments: Kristen Ryan is a 59 y.o. female who presents to the Emergency Department complaining of burning back pain with onset around 08/31/14. Pt states she had an MRI done on that date for chronic low back pain. MRI consistent with degenerative arthritis Pt has not f/u with a neurologist yet. Pain is at left paralumbar area. Nonradiating. Worse with changing positions improved with remaining still. No loss of bladder or bowel control no fever no other associated symptoms Pt notes changing positions aggravates the pain. Pt has taken Tylenol and ibuprofen in past with no relief. Pt is able to ambulate with a cane.  Pt is not a smoker and denies EtOH use.  Pt denies any other medical complaints at this time. Pt's PCP is Dr. Felecia ShellingFanta.   Past Medical History  Diagnosis Date  . Asthma   . Bipolar 1 disorder   . Schizophrenia   . Neuropathy   . Bronchitis   . Thyroid disease   . COPD (chronic obstructive pulmonary disease)    Past Surgical History  Procedure Laterality Date  . Abdominal hysterectomy    . Tonsillectomy    . Knee surgery    . Elbow surgery    . Foot surgery    . Cesarean section    . Broke left arm    . Colonoscopy N/A 08/30/2014    Procedure: COLONOSCOPY;  Surgeon: Malissa HippoNajeeb U Rehman, MD;  Location: [facility];  Service: Endoscopy;  Laterality: N/A;  1200   Family History  Problem Relation Age of Onset  . Dementia Mother   . Dementia Father   . Other Son     MVA accident   History  Substance Use Topics  . Smoking status: Former  Smoker    Types: Cigarettes  . Smokeless tobacco: Never Used  . Alcohol Use: No   OB History    Gravida Para Term Preterm AB TAB SAB Ectopic Multiple Living   1 1        1      Review of Systems  Constitutional: Negative.   HENT: Negative.   Respiratory: Negative.   Cardiovascular: Negative.   Gastrointestinal: Negative.   Musculoskeletal: Positive for back pain.  Skin: Negative.   Neurological: Negative.   Psychiatric/Behavioral: Negative.       Allergies  Aminophylline; Sumatriptan; Theophylline; Codeine; Depakote; Divalproex sodium; Doxycycline; Lamictal; Magnesium-containing compounds; Penicillins; Pentazocine lactate; Risperidone; Tetracyclines & related; Vistaril; Tetracycline; and Zyprexa  Home Medications   Prior to Admission medications   Medication Sig Start Date End Date Taking? Authorizing Provider  Albuterol Sulfate (PROVENTIL HFA IN) Inhale 1-2 puffs into the lungs as needed.     Historical Provider, MD  ALPRAZolam Prudy Feeler(XANAX) 1 MG tablet Take 1 mg by mouth 3 (three) times daily as needed for sleep or anxiety.     Historical Provider, MD  aspirin 325 MG tablet Take 325 mg by mouth daily.    Historical Provider, MD  budesonide-formoterol (SYMBICORT) 160-4.5 MCG/ACT inhaler Inhale 2  puffs into the lungs 2 (two) times daily. 03/25/14   Lorre Nick, MD  cyclobenzaprine (FLEXERIL) 10 MG tablet Take 10 mg by mouth 3 (three) times daily as needed for muscle spasms.    Historical Provider, MD  dicyclomine (BENTYL) 10 MG capsule Take 1 capsule (10 mg total) by mouth 3 (three) times daily as needed for spasms. 08/30/14   Malissa Hippo, MD  gabapentin (NEURONTIN) 300 MG capsule Take 300 mg by mouth 2 (two) times daily.    Historical Provider, MD  ibuprofen (ADVIL,MOTRIN) 200 MG tablet Take 200 mg by mouth every 6 (six) hours as needed.    Historical Provider, MD  levothyroxine (SYNTHROID, LEVOTHROID) 50 MCG tablet Take 50 mcg by mouth daily.    Historical Provider, MD   loratadine (ALLERGY RELIEF) 10 MG tablet Take 1 mg by mouth daily.    Historical Provider, MD  sodium chloride (OCEAN) 0.65 % SOLN nasal spray Place 1 spray into both nostrils as needed for congestion.    Historical Provider, MD   BP 146/79 mmHg  Pulse 94  Temp(Src) 97.9 F (36.6 C) (Oral)  Resp 20  Ht  (1.727 m)  Wt 150 lb (68.04 kg)  BMI 22.81 kg/m2  SpO2 98% Physical Exam  Constitutional: She is oriented to person, place, and time. She appears well-developed and well-nourished. No distress.  HENT:  Head: Normocephalic and atraumatic.  Eyes: Conjunctivae are normal. Pupils are equal, round, and reactive to light.  Neck: Neck supple. No tracheal deviation present. No thyromegaly present.  Cardiovascular: Normal rate and regular rhythm.   No murmur heard. Pulmonary/Chest: Effort normal and breath sounds normal.  Abdominal: Soft. Bowel sounds are normal. She exhibits no distension. There is no tenderness.  Musculoskeletal: Normal range of motion. She exhibits no edema or tenderness.  [place] spine nontender. No flank tenderness  Neurological: She is alert and oriented to person, place, and time. No cranial nerve deficit. Coordination normal.  Gait normal  Skin: Skin is warm and dry. No rash noted.  Psychiatric: She has a normal mood and affect.  Nursing note and vitals reviewed.   ED Course  Procedures (including critical care time) DIAGNOSTIC STUDIES: Oxygen Saturation is 98% on room air, normal by my interpretation.    COORDINATION OF CARE: 10:36 PM Discussed treatment plan with patient at beside, the patient agrees with the plan and has no further questions at this time.   Labs Review Labs Reviewed - No data to display  Imaging Review No results found.   EKG Interpretation None       MDM Final diagnoses:  None    pain is chronic. Suggest continue Tylenol. Warm compresses. Referral back to primary care physician Dr. Tylene Fantasia request pain clinic   Diagnosis chronic back pain  I personally performed the services described in this documentation, which was scribed in my presence. The recorded information has been reviewed and considered.      Doug Sou, MD 09/16/14 2244

## 2014-09-16 NOTE — Discharge Instructions (Signed)
Chronic Back Pain It is safe to take Tylenol as directed for pain. Call Dr. Felecia ShellingFanta and ask for a referral to a pain clinic  When back pain lasts longer than 3 months, it is called chronic back pain.People with chronic back pain often go through certain periods that are more intense (flare-ups).  CAUSES Chronic back pain can be caused by wear and tear (degeneration) on different structures in your back. These structures include:  The bones of your spine (vertebrae) and the joints surrounding your spinal cord and nerve roots (facets).  The strong, fibrous tissues that connect your vertebrae (ligaments). Degeneration of these structures may result in pressure on your nerves. This can lead to constant pain. HOME CARE INSTRUCTIONS  Avoid bending, heavy lifting, prolonged sitting, and activities which make the problem worse.  Take brief periods of rest throughout the day to reduce your pain. Lying down or standing usually is better than sitting while you are resting.  Take over-the-counter or prescription medicines only as directed by your caregiver. SEEK IMMEDIATE MEDICAL CARE IF:   You have weakness or numbness in one of your legs or feet.  You have trouble controlling your bladder or bowels.  You have nausea, vomiting, abdominal pain, shortness of breath, or fainting. Document Released: 07/30/2004 Document Revised: 09/14/2011 Document Reviewed: 06/06/2011 [facility] Patient Information 2015 [employer], [place]. This information is not intended to replace advice given to you by your health care provider. Make sure you discuss any questions you have with your health care provider.

## 2014-09-16 NOTE — ED Notes (Signed)
Pt c/o back pain that started after her MRI on 08/31/14. States it is different from her chronic back pain in that it is constant and "cramping".

## 2014-09-17 ENCOUNTER — Telehealth: Payer: Self-pay | Admitting: Neurology

## 2014-09-17 DIAGNOSIS — M546 Pain in thoracic spine: Secondary | ICD-10-CM

## 2014-09-17 NOTE — Telephone Encounter (Signed)
Patient stated she went to ER over the weekend and requesting Rx for pain.  She stated she's having cramps in back.  Please call and advise.

## 2014-09-17 NOTE — Telephone Encounter (Signed)
Pt aware of results and agreeable to pain management referral.

## 2014-09-17 NOTE — Telephone Encounter (Signed)
Kristen Ryan: Please let patient know, MRI of the thoracic showed no evidence of cord compression, I will refer her to pain management.  IMPRESSION: Slightly abnormal MRI Thoraxic spine with mild disc and end plate degenerative changes from T 5-8 but without significant compression.

## 2014-10-09 ENCOUNTER — Telehealth: Payer: Self-pay | Admitting: Neurology

## 2014-10-09 NOTE — Telephone Encounter (Signed)
Patient stated she's experiencing pain in back and would like to discuss prior to appointment on 4/6.  Please call and advise.

## 2014-10-09 NOTE — Telephone Encounter (Signed)
She would like to further discuss MRI results - she has an appt tomorrow.

## 2014-10-10 ENCOUNTER — Ambulatory Visit: Payer: Medicare Other | Admitting: Neurology

## 2014-11-06 ENCOUNTER — Emergency Department (HOSPITAL_COMMUNITY)
Admission: EM | Admit: 2014-11-06 | Discharge: 2014-11-07 | Disposition: A | Discharge status: Home / Self Care | Payer: Medicare Other | Attending: Emergency Medicine | Admitting: Emergency Medicine

## 2014-11-06 ENCOUNTER — Emergency Department (HOSPITAL_COMMUNITY): Payer: Medicare Other

## 2014-11-06 ENCOUNTER — Encounter (HOSPITAL_COMMUNITY): Payer: Self-pay

## 2014-11-06 DIAGNOSIS — Z7951 Long term (current) use of inhaled steroids: Secondary | ICD-10-CM | POA: Diagnosis not present

## 2014-11-06 DIAGNOSIS — Z880 Allergy status to penicillin: Secondary | ICD-10-CM | POA: Insufficient documentation

## 2014-11-06 DIAGNOSIS — F319 Bipolar disorder, unspecified: Secondary | ICD-10-CM | POA: Diagnosis not present

## 2014-11-06 DIAGNOSIS — R079 Chest pain, unspecified: Secondary | ICD-10-CM | POA: Insufficient documentation

## 2014-11-06 DIAGNOSIS — R071 Chest pain on breathing: Secondary | ICD-10-CM | POA: Diagnosis not present

## 2014-11-06 DIAGNOSIS — J449 Chronic obstructive pulmonary disease, unspecified: Secondary | ICD-10-CM | POA: Diagnosis not present

## 2014-11-06 DIAGNOSIS — G629 Polyneuropathy, unspecified: Secondary | ICD-10-CM | POA: Diagnosis not present

## 2014-11-06 DIAGNOSIS — Z7982 Long term (current) use of aspirin: Secondary | ICD-10-CM | POA: Insufficient documentation

## 2014-11-06 DIAGNOSIS — E079 Disorder of thyroid, unspecified: Secondary | ICD-10-CM | POA: Insufficient documentation

## 2014-11-06 DIAGNOSIS — Z79899 Other long term (current) drug therapy: Secondary | ICD-10-CM | POA: Diagnosis not present

## 2014-11-06 DIAGNOSIS — Z87891 Personal history of nicotine dependence: Secondary | ICD-10-CM | POA: Diagnosis not present

## 2014-11-06 LAB — BASIC METABOLIC PANEL
Anion gap: 10 (ref 5–15)
BUN: 17 mg/dL (ref 6–20)
CO2: 29 mmol/L (ref 22–32)
Calcium: 9.6 mg/dL (ref 8.9–10.3)
Chloride: 100 mmol/L — ABNORMAL LOW (ref 101–111)
Creatinine, Ser: 0.96 mg/dL (ref 0.44–1.00)
GFR calc Af Amer: >60 mL/min (ref >60)
GFR calc non Af Amer: >60 mL/min (ref >60)
Glucose, Bld: 92 mg/dL (ref 70–99)
Potassium: 3.8 mmol/L (ref 3.5–5.1)
Sodium: 139 mmol/L (ref 135–145)

## 2014-11-06 LAB — TROPONIN I: Troponin I: <0.03 ng/mL (ref <0.031)

## 2014-11-06 LAB — CBC WITH DIFFERENTIAL/PLATELET
Basophils Absolute: 0 10*3/uL (ref 0.0–0.1)
Basophils Relative: 0 % (ref 0–1)
Eosinophils Absolute: 0.2 10*3/uL (ref 0.0–0.7)
Eosinophils Relative: 3 % (ref 0–5)
HCT: 39.6 % (ref 36.0–46.0)
Hemoglobin: 12.3 g/dL (ref 12.0–15.0)
Lymphocytes Relative: 24 % (ref 12–46)
Lymphs Abs: 1.7 10*3/uL (ref 0.7–4.0)
MCH: 27.2 pg (ref 26.0–34.0)
MCHC: 31.1 g/dL (ref 30.0–36.0)
MCV: 87.4 fL (ref 78.0–100.0)
Monocytes Absolute: 0.6 10*3/uL (ref 0.1–1.0)
Monocytes Relative: 8 % (ref 3–12)
Neutro Abs: 4.8 10*3/uL (ref 1.7–7.7)
Neutrophils Relative %: 65 % (ref 43–77)
Platelets: 218 10*3/uL (ref 150–400)
RBC: 4.53 MIL/uL (ref 3.87–5.11)
RDW: 15.3 % (ref 11.5–15.5)
WBC: 7.3 10*3/uL (ref 4.0–10.5)

## 2014-11-06 NOTE — Discharge Instructions (Signed)
Chest Wall Pain °Chest wall pain is pain in or around the bones and muscles of your chest. It may take up to 6 weeks to get better. It may take longer if you must stay physically active in your work and activities.  °CAUSES  °Chest wall pain may happen on its own. However, it may be caused by: °· A viral illness like the flu. °· Injury. °· Coughing. °· Exercise. °· Arthritis. °· Fibromyalgia. °· Shingles. °HOME CARE INSTRUCTIONS  °· Avoid overtiring physical activity. Try not to strain or perform activities that cause pain. This includes any activities using your chest or your abdominal and side muscles, especially if heavy weights are used. °· Put ice on the sore area. °¨ Put ice in a plastic bag. °¨ Place a towel between your skin and the bag. °¨ Leave the ice on for 15-20 minutes per hour while awake for the first 2 days. °· Only take over-the-counter or prescription medicines for pain, discomfort, or fever as directed by your caregiver. °SEEK IMMEDIATE MEDICAL CARE IF:  °· Your pain increases, or you are very uncomfortable. °· You have a fever. °· Your chest pain becomes worse. °· You have new, unexplained symptoms. °· You have nausea or vomiting. °· You feel sweaty or lightheaded. °· You have a cough with phlegm (sputum), or you cough up blood. °MAKE SURE YOU:  °· Understand these instructions. °· Will watch your condition. °· Will get help right away if you are not doing well or get worse. °Document Released: 06/22/2005 Document Revised: 09/14/2011 Document Reviewed: 02/16/2011 °ExitCare® Patient Information ©2015 [employer]. This information is not intended to replace advice given to you by your health care provider. Make sure you discuss any questions you have with your health care provider. ° °Chest Pain (Nonspecific) °It is often hard to give a specific diagnosis for the cause of chest pain. There is always a chance that your pain could be related to something serious, such as a heart attack or a blood  clot in the lungs. You need to follow up with your health care provider for further evaluation. °CAUSES  °· Heartburn. °· Pneumonia or bronchitis. °· Anxiety or stress. °· Inflammation around your heart (pericarditis) or lung (pleuritis or pleurisy). °· A blood clot in the lung. °· A collapsed lung (pneumothorax). It can develop suddenly on its own (spontaneous pneumothorax) or from trauma to the chest. °· Shingles infection (herpes zoster virus). °The chest wall is composed of bones, muscles, and cartilage. Any of these can be the source of the pain. °· The bones can be bruised by injury. °· The muscles or cartilage can be strained by coughing or overwork. °· The cartilage can be affected by inflammation and become sore (costochondritis). °DIAGNOSIS  °Lab tests or other studies may be needed to find the cause of your pain. Your health care provider may have you take a test called an ambulatory electrocardiogram (ECG). An ECG records your heartbeat patterns over a 24-hour period. You may also have other tests, such as: °· Transthoracic echocardiogram (TTE). During echocardiography, sound waves are used to evaluate how blood flows through your heart. °· Transesophageal echocardiogram (TEE). °· Cardiac monitoring. This allows your health care provider to monitor your heart rate and rhythm in real time. °· Holter monitor. This is a portable device that records your heartbeat and can help diagnose heart arrhythmias. It allows your health care provider to track your heart activity for several days, if needed. °· Stress tests by   exercise or by giving medicine that makes the heart beat faster. °TREATMENT  °· Treatment depends on what may be causing your chest pain. Treatment may include: °¨ Acid blockers for heartburn. °¨ Anti-inflammatory medicine. °¨ Pain medicine for inflammatory conditions. °¨ Antibiotics if an infection is present. °· You may be advised to change lifestyle habits. This includes stopping smoking and  avoiding alcohol, caffeine, and chocolate. °· You may be advised to keep your head raised (elevated) when sleeping. This reduces the chance of acid going backward from your stomach into your esophagus. °Most of the time, nonspecific chest pain will improve within 2-3 days with rest and mild pain medicine.  °HOME CARE INSTRUCTIONS  °· If antibiotics were prescribed, take them as directed. Finish them even if you start to feel better. °· For the next few days, avoid physical activities that bring on chest pain. Continue physical activities as directed. °· Do not use any tobacco products, including cigarettes, chewing tobacco, or electronic cigarettes. °· Avoid drinking alcohol. °· Only take medicine as directed by your health care provider. °· Follow your health care provider's suggestions for further testing if your chest pain does not go away. °· Keep any follow-up appointments you made. If you do not go to an appointment, you could develop lasting (chronic) problems with pain. If there is any problem keeping an appointment, call to reschedule. °SEEK MEDICAL CARE IF:  °· Your chest pain does not go away, even after treatment. °· You have a rash with blisters on your chest. °· You have a fever. °SEEK IMMEDIATE MEDICAL CARE IF:  °· You have increased chest pain or pain that spreads to your arm, neck, jaw, back, or abdomen. °· You have shortness of breath. °· You have an increasing cough, or you cough up blood. °· You have severe back or abdominal pain. °· You feel nauseous or vomit. °· You have severe weakness. °· You faint. °· You have chills. °This is an emergency. Do not wait to see if the pain will go away. Get medical help at once. Call your local emergency services (911 in [place] Do not drive yourself to the hospital. °MAKE SURE YOU:  °· Understand these instructions. °· Will watch your condition. °· Will get help right away if you are not doing well or get worse. °Document Released: 04/01/2005 Document Revised:  06/27/2013 Document Reviewed: 01/26/2008 °ExitCare® Patient Information ©2015 [employer] LLC. This information is not intended to replace advice given to you by your health care provider. Make sure you discuss any questions you have with your health care provider. ° °

## 2014-11-06 NOTE — ED Notes (Signed)
Pt reports was drying her hair and started having sharp pain in center of chest that is worse with movement and deep breathing.  Denies injury, denies recent cough or cold.

## 2014-11-07 NOTE — ED Notes (Signed)
Patient verbalizes understanding of discharge instructions, home care and follow up care if needed. Patient ambulatory out of department at this time 

## 2014-11-14 NOTE — ED Provider Notes (Signed)
CSN: [id number]     Arrival date & time 11/06/14  1755 History   First MD Initiated Contact with Patient 11/06/14 2019     Chief Complaint  Patient presents with  . Chest Pain     (Consider location/radiation/quality/duration/timing/severity/associated sxs/prior Treatment) HPI   59yf with CP. Shortly before arrival, she was drying her hair and started having sharp pain in center of chest that is worse with movement and deep breathing. No sob. No cough. No palpitations, nausea or diaphoresis. No unusual leg pain or swelling. No difference supine or sitting up.   Past Medical History  Diagnosis Date  . Asthma   . Bipolar 1 disorder   . Schizophrenia   . Neuropathy   . Bronchitis   . Thyroid disease   . COPD (chronic obstructive pulmonary disease)    Past Surgical History  Procedure Laterality Date  . Abdominal hysterectomy    . Tonsillectomy    . Knee surgery    . Elbow surgery    . Foot surgery    . Cesarean section    . Broke left arm    . Colonoscopy N/A 08/30/2014    Procedure: COLONOSCOPY;  Surgeon: Malissa HippoNajeeb U Rehman, MD;  Location: [facility];  Service: Endoscopy;  Laterality: N/A;  1200   Family History  Problem Relation Age of Onset  . Dementia Mother   . Dementia Father   . Other Son     MVA accident   History  Substance Use Topics  . Smoking status: Former Smoker    Types: Cigarettes  . Smokeless tobacco: Never Used  . Alcohol Use: No   OB History    Gravida Para Term Preterm AB TAB SAB Ectopic Multiple Living   1 1        1      Review of Systems  All systems reviewed and negative, other than as noted in HPI.   Allergies  Aminophylline; Sumatriptan; Theophylline; Codeine; Depakote; Divalproex sodium; Doxycycline; Lamictal; Magnesium-containing compounds; Penicillins; Pentazocine lactate; Risperidone; Tetracyclines & related; Vistaril; Tetracycline; and Zyprexa  Home Medications   Prior to Admission medications   Medication Sig Start Date End  Date Taking? Authorizing Provider  Albuterol Sulfate (PROVENTIL HFA IN) Inhale 1-2 puffs into the lungs as needed (shorntess of breath).    Yes Historical Provider, MD  ALPRAZolam Prudy Feeler(XANAX) 1 MG tablet Take 1 mg by mouth 3 (three) times daily as needed for sleep or anxiety.    Yes Historical Provider, MD  aspirin 325 MG tablet Take 325 mg by mouth daily.   Yes Historical Provider, MD  atorvastatin (LIPITOR) 40 MG tablet Take 1 tablet by mouth daily. 09/24/14  Yes Historical Provider, MD  budesonide-formoterol (SYMBICORT) 160-4.5 MCG/ACT inhaler Inhale 2 puffs into the lungs 2 (two) times daily. 03/25/14  Yes Lorre NickAnthony Allen, MD  carbamazepine (TEGRETOL) 200 MG tablet Take 1 tablet by mouth 2 (two) times daily. 10/15/14  Yes Historical Provider, MD  cyclobenzaprine (FLEXERIL) 10 MG tablet Take 10 mg by mouth 3 (three) times daily as needed for muscle spasms.   Yes Historical Provider, MD  dicyclomine (BENTYL) 10 MG capsule Take 1 capsule (10 mg total) by mouth 3 (three) times daily as needed for spasms. Patient taking differently: Take 10 mg by mouth 3 (three) times daily.  08/30/14  Yes Malissa HippoNajeeb U Rehman, MD  gabapentin (NEURONTIN) 600 MG tablet Take 1 tablet by mouth 2 (two) times daily. 10/18/14  Yes Historical Provider, MD  levothyroxine (SYNTHROID, LEVOTHROID) 50 MCG  tablet Take 50 mcg by mouth daily.   Yes Historical Provider, MD  loratadine (ALLERGY RELIEF) 10 MG tablet Take 1 mg by mouth daily.   Yes Historical Provider, MD  omeprazole (PRILOSEC) 20 MG capsule Take 1 capsule by mouth daily. 10/18/14  Yes Historical Provider, MD  sodium chloride (OCEAN) 0.65 % SOLN nasal spray Place 1 spray into both nostrils as needed for congestion.   Yes Historical Provider, MD  ziprasidone (GEODON) 60 MG capsule Take 1 capsule by mouth 2 (two) times daily. 10/08/14  Yes Historical Provider, MD   BP 129/91 mmHg  Pulse 74  Temp(Src) 98.4 F (36.9 C) (Oral)  Resp 20  Ht 6' (1.829 m)  Wt 245 lb (111.131 kg)  BMI  33.22 kg/m2  SpO2 100% Physical Exam  Constitutional: She appears well-developed and well-nourished. No distress.  HENT:  Head: Normocephalic and atraumatic.  Eyes: Conjunctivae are normal. Right eye exhibits no discharge. Left eye exhibits no discharge.  Neck: Neck supple.  Cardiovascular: Normal rate, regular rhythm and normal heart sounds.  Exam reveals no gallop and no friction rub.   No murmur heard. Pulmonary/Chest: Effort normal and breath sounds normal. No respiratory distress. She exhibits tenderness.  Abdominal: Soft. She exhibits no distension. There is no tenderness.  Musculoskeletal: She exhibits no edema or tenderness.  Lower extremities symmetric as compared to each other. No calf tenderness. Negative Homan's. No palpable cords.   Neurological: She is alert.  Skin: Skin is warm and dry.  Psychiatric: She has a normal mood and affect. Her behavior is normal. Thought content normal.  Nursing note and vitals reviewed.   ED Course  Procedures (including critical care time) Labs Review Labs Reviewed  BASIC METABOLIC PANEL - Abnormal; Notable for the following:    Chloride 100 (*)    All other components within normal limits  CBC WITH DIFFERENTIAL/PLATELET  TROPONIN I    Imaging Review No results found.   Dg Chest 2 View  11/06/2014   CLINICAL DATA:  1 hour of mid chest pain and difficulty breathing  EXAM: CHEST  2 VIEW  COMPARISON:  June 07, 2014  FINDINGS: There is minimal scarring in the left base. Lungs are otherwise clear. Heart size and pulmonary vascularity are normal. No adenopathy. There is anterior wedging of a mid thoracic vertebral body.  IMPRESSION: Mild scarring left base.  No edema or consolidation.   Electronically Signed   By: Bretta Bang III M.D.   On: 11/06/2014 18:29    EKG Interpretation   Date/Time:  Tuesday Nov 06 2014 18:06:12 EDT Ventricular Rate:  95 PR Interval:  150 QRS Duration: 84 QT Interval:  358 QTC Calculation: 449 R  Axis:   91 Text Interpretation:  Normal sinus rhythm Rightward axis Borderline ECG ED  PHYSICIAN INTERPRETATION AVAILABLE IN [facility] Confirmed by TEST,  Record ([id number]) on 11/08/2014 7:35:55 AM      MDM   Final diagnoses:  Chest pain, unspecified chest pain type   59yf with CP. Suspect musculoskeletal with such reproducibility with palpation and movement. Doubt ACS, dissection, PE, serious infection or other emergent pathology. It has been determined that no acute conditions requiring further emergency intervention are present at this time. The patient has been advised of the diagnosis and plan. I reviewed any labs and imaging including any potential incidental findings. We have discussed signs and symptoms that warrant return to the ED and they are listed in the discharge instructions.      Raeford Razor,  MD 11/14/14 1443

## 2014-11-15 DIAGNOSIS — E039 Hypothyroidism, unspecified: Secondary | ICD-10-CM | POA: Diagnosis not present

## 2014-11-15 DIAGNOSIS — G629 Polyneuropathy, unspecified: Secondary | ICD-10-CM | POA: Diagnosis not present

## 2014-11-15 DIAGNOSIS — I10 Essential (primary) hypertension: Secondary | ICD-10-CM | POA: Diagnosis not present

## 2014-11-15 DIAGNOSIS — E785 Hyperlipidemia, unspecified: Secondary | ICD-10-CM | POA: Diagnosis not present

## 2014-11-30 ENCOUNTER — Other Ambulatory Visit (INDEPENDENT_AMBULATORY_CARE_PROVIDER_SITE_OTHER): Payer: Self-pay | Admitting: Internal Medicine

## 2014-12-04 ENCOUNTER — Other Ambulatory Visit (INDEPENDENT_AMBULATORY_CARE_PROVIDER_SITE_OTHER): Payer: Self-pay | Admitting: Internal Medicine

## 2014-12-20 DIAGNOSIS — E039 Hypothyroidism, unspecified: Secondary | ICD-10-CM | POA: Diagnosis not present

## 2014-12-20 DIAGNOSIS — R42 Dizziness and giddiness: Secondary | ICD-10-CM | POA: Diagnosis not present

## 2014-12-20 DIAGNOSIS — G629 Polyneuropathy, unspecified: Secondary | ICD-10-CM | POA: Diagnosis not present

## 2014-12-20 DIAGNOSIS — E785 Hyperlipidemia, unspecified: Secondary | ICD-10-CM | POA: Diagnosis not present

## 2015-02-14 ENCOUNTER — Ambulatory Visit (INDEPENDENT_AMBULATORY_CARE_PROVIDER_SITE_OTHER): Payer: Medicare Other | Admitting: Internal Medicine

## 2015-02-14 ENCOUNTER — Encounter (INDEPENDENT_AMBULATORY_CARE_PROVIDER_SITE_OTHER): Payer: Self-pay | Admitting: Internal Medicine

## 2015-02-14 VITALS — BP 140/70 | HR 72 | Temp 98.2°F | Ht 67.5 in | Wt 157.2 lb

## 2015-02-14 DIAGNOSIS — R197 Diarrhea, unspecified: Secondary | ICD-10-CM | POA: Diagnosis not present

## 2015-02-14 DIAGNOSIS — F319 Bipolar disorder, unspecified: Secondary | ICD-10-CM

## 2015-02-14 HISTORY — DX: Bipolar disorder, unspecified: F31.9

## 2015-02-14 LAB — CBC WITH DIFFERENTIAL/PLATELET
BASOS PCT: 0 % (ref 0–1)
Basophils Absolute: 0 10*3/uL (ref 0.0–0.1)
Eosinophils Absolute: 0.2 10*3/uL (ref 0.0–0.7)
Eosinophils Relative: 3 % (ref 0–5)
HEMATOCRIT: 34.5 % — AB (ref 36.0–46.0)
Hemoglobin: 11.6 g/dL — ABNORMAL LOW (ref 12.0–15.0)
LYMPHS ABS: 1 10*3/uL (ref 0.7–4.0)
Lymphocytes Relative: 12 % (ref 12–46)
MCH: 27.9 pg (ref 26.0–34.0)
MCHC: 33.6 g/dL (ref 30.0–36.0)
MCV: 82.9 fL (ref 78.0–100.0)
MONOS PCT: 8 % (ref 3–12)
MPV: 8.8 fL (ref 8.6–12.4)
Monocytes Absolute: 0.7 10*3/uL (ref 0.1–1.0)
NEUTROS ABS: 6.3 10*3/uL (ref 1.7–7.7)
Neutrophils Relative %: 77 % (ref 43–77)
Platelets: 281 10*3/uL (ref 150–400)
RBC: 4.16 MIL/uL (ref 3.87–5.11)
RDW: 14.6 % (ref 11.5–15.5)
WBC: 8.2 10*3/uL (ref 4.0–10.5)

## 2015-02-14 LAB — HEPATIC FUNCTION PANEL
ALK PHOS: 124 U/L (ref 33–130)
ALT: 28 U/L (ref 6–29)
AST: 24 U/L (ref 10–35)
Albumin: 3.7 g/dL (ref 3.6–5.1)
BILIRUBIN INDIRECT: 0.2 mg/dL (ref 0.2–1.2)
Bilirubin, Direct: 0.1 mg/dL (ref <=0.2)
Total Bilirubin: 0.3 mg/dL (ref 0.2–1.2)
Total Protein: 6.1 g/dL (ref 6.1–8.1)

## 2015-02-14 NOTE — Patient Instructions (Addendum)
Dicyclomine four times a day.  Imodium as needed. CBC, Hepatic function today.  GI pathogen

## 2015-02-14 NOTE — Progress Notes (Signed)
Subjective:    Patient ID: Kristen Ryan, female    DOB: 1956/04/12, 59 y.o.   MRN: [id number]  HPI Presents today with c/o diarrhea last week x 7 days.  She was having numerous stools during the day. She denies hx of diarrhea. Her BM are usually formed. She had a low grade fever associated with the diarrhea. She has not been on any recent antibiotics. The diarrhea stopped Monday.  No BM since Monday.  She took Imodium Sunday which helped with the diarrhea. She says she has not eaten since Sunday. She just has not been hungry. She has kept fluids in. Appetite for the most part okay.  No weight loss. She had cramping with the diarrhea. Hx of intermittent diarrhea. She has been helping take care of both of her parents and has been under stress. Her son has moved in with her parents to help them. Father has had a stroke and mother has Alzheimer's.     08/30/2014   Colonoscopy  Indications: Patient is 59 year old Caucasian female who is here for average risk screening colonoscopy. This is patient's first exam. Patient gives history of intermittent hematochezia which she believes is secondary to hemorrhoids. She also has had intermittent diarrhea and has lost 11 pounds in 5 weeks. She states she's been under a lot of stress on account of both parents illness. She has not been eating 3 meals a day. Impression:   Examination performed to cecum. Redundant colon with a moderate number of diverticula at sigmoid colon. Small external hemorrhoids.   Review of Systems Past Medical History  Diagnosis Date  . Asthma   . Bipolar 1 disorder   . Schizophrenia   . Neuropathy   . Bronchitis   . Thyroid disease   . COPD (chronic obstructive pulmonary disease)     Past Surgical History  Procedure Laterality Date  . Abdominal hysterectomy    . Tonsillectomy    . Knee surgery    . Elbow surgery    . Foot surgery    . Cesarean section    . Broke left arm    . Colonoscopy N/A 08/30/2014   Procedure: COLONOSCOPY;  Surgeon: Malissa Hippo, MD;  Location: [facility];  Service: Endoscopy;  Laterality: N/A;  1200    Allergies  Allergen Reactions  . Aminophylline Anaphylaxis  . Sumatriptan Anaphylaxis and Other (See Comments)    Causes fainting  . Theophylline Anaphylaxis  . Codeine Itching and Other (See Comments)    Too strong for patient   . Depakote [Divalproex Sodium] Other (See Comments)    Hair loss  . Divalproex Sodium Other (See Comments)    Causes hair to fall out  . Doxycycline Other (See Comments)    headache  . Lamictal [Lamotrigine]     Destroys thyroid  . Magnesium-Containing Compounds Hives  . Penicillins Nausea And Vomiting  . Pentazocine Lactate Other (See Comments)    Unknown reaction  . Risperidone Other (See Comments)    Insomnia   . Tetracyclines & Related   . Vistaril [Hydroxyzine Hcl] Nausea And Vomiting  . Tetracycline Rash    Headaches  . Zyprexa [Olanzapine] Rash and Other (See Comments)    Insomnia    Current Outpatient Prescriptions on File Prior to Visit  Medication Sig Dispense Refill  . Albuterol Sulfate (PROVENTIL HFA IN) Inhale 1-2 puffs into the lungs as needed (shorntess of breath).     . ALPRAZolam (XANAX) 1 MG tablet Take 1 mg by  mouth 3 (three) times daily as needed for sleep or anxiety.     Marland Kitchen aspirin 325 MG tablet Take 325 mg by mouth daily.    Marland Kitchen atorvastatin (LIPITOR) 40 MG tablet Take 1 tablet by mouth daily.    . budesonide-formoterol (SYMBICORT) 160-4.5 MCG/ACT inhaler Inhale 2 puffs into the lungs 2 (two) times daily. 1 Inhaler 1  . carbamazepine (TEGRETOL) 200 MG tablet Take 1 tablet by mouth 2 (two) times daily.    . cyclobenzaprine (FLEXERIL) 10 MG tablet Take 10 mg by mouth 3 (three) times daily as needed for muscle spasms.    Marland Kitchen dicyclomine (BENTYL) 10 MG capsule TAKE 1 CAPSULE BY MOUTH THREE TIMES DAILY AS NEEDED FOR SPASMS. 60 capsule 5  . gabapentin (NEURONTIN) 600 MG tablet Take 1 tablet by mouth 2 (two)  times daily.    Marland Kitchen levothyroxine (SYNTHROID, LEVOTHROID) 50 MCG tablet Take 50 mcg by mouth daily.    Marland Kitchen loratadine (ALLERGY RELIEF) 10 MG tablet Take 1 mg by mouth daily.    Marland Kitchen omeprazole (PRILOSEC) 20 MG capsule Take 1 capsule by mouth daily.    . sodium chloride (OCEAN) 0.65 % SOLN nasal spray Place 1 spray into both nostrils as needed for congestion.    . ziprasidone (GEODON) 60 MG capsule Take 1 capsule by mouth 2 (two) times daily.     No current facility-administered medications on file prior to visit.        Objective:   Physical Exam Blood pressure 140/70, pulse 72, temperature 98.2 F (36.8 C), height 5' 7.5" (1.715 m), weight 157 lb 3.2 oz (71.305 kg).  Alert and oriented. Skin warm and dry. Oral mucosa is moist.   . Sclera anicteric, conjunctivae is pink. Thyroid not enlarged. No cervical lymphadenopathy. Lungs clear. Heart regular rate and rhythm.  Abdomen is soft. Bowel sounds are positive. No hepatomegaly. No abdominal masses felt. No tenderness.  No edema to lower extremities.         Assessment & Plan:  Diarrhea resolved. Will get stool studies on her to be sure she does not have C-diff, CBC and Hepatic function. May take Dicyclomine QID.  OV as needed.

## 2015-03-04 ENCOUNTER — Telehealth (INDEPENDENT_AMBULATORY_CARE_PROVIDER_SITE_OTHER): Payer: Self-pay | Admitting: *Deleted

## 2015-03-04 NOTE — Telephone Encounter (Signed)
Patient left message on progress report -- she states she is having 3-4 soft BM daily and her "bottom hurts". She is aware you are out of office until 03/06/15.

## 2015-03-07 NOTE — Telephone Encounter (Signed)
Tried to reach at home but line is busy.

## 2015-03-10 ENCOUNTER — Emergency Department (HOSPITAL_COMMUNITY)
Admission: EM | Admit: 2015-03-10 | Discharge: 2015-03-10 | Disposition: A | Discharge status: Home / Self Care | Payer: Medicare Other | Attending: Emergency Medicine | Admitting: Emergency Medicine

## 2015-03-10 ENCOUNTER — Encounter (HOSPITAL_COMMUNITY): Payer: Self-pay | Admitting: Cardiology

## 2015-03-10 ENCOUNTER — Emergency Department (HOSPITAL_COMMUNITY): Payer: Medicare Other

## 2015-03-10 DIAGNOSIS — F319 Bipolar disorder, unspecified: Secondary | ICD-10-CM | POA: Diagnosis not present

## 2015-03-10 DIAGNOSIS — F209 Schizophrenia, unspecified: Secondary | ICD-10-CM | POA: Insufficient documentation

## 2015-03-10 DIAGNOSIS — Z23 Encounter for immunization: Secondary | ICD-10-CM | POA: Diagnosis not present

## 2015-03-10 DIAGNOSIS — Z7951 Long term (current) use of inhaled steroids: Secondary | ICD-10-CM | POA: Insufficient documentation

## 2015-03-10 DIAGNOSIS — S51831A Puncture wound without foreign body of right forearm, initial encounter: Secondary | ICD-10-CM | POA: Diagnosis not present

## 2015-03-10 DIAGNOSIS — S61451A Open bite of right hand, initial encounter: Secondary | ICD-10-CM | POA: Diagnosis not present

## 2015-03-10 DIAGNOSIS — S51832A Puncture wound without foreign body of left forearm, initial encounter: Secondary | ICD-10-CM | POA: Diagnosis not present

## 2015-03-10 DIAGNOSIS — T148XXA Other injury of unspecified body region, initial encounter: Secondary | ICD-10-CM

## 2015-03-10 DIAGNOSIS — M7989 Other specified soft tissue disorders: Secondary | ICD-10-CM | POA: Diagnosis not present

## 2015-03-10 DIAGNOSIS — S61532A Puncture wound without foreign body of left wrist, initial encounter: Secondary | ICD-10-CM | POA: Diagnosis not present

## 2015-03-10 DIAGNOSIS — Y998 Other external cause status: Secondary | ICD-10-CM | POA: Diagnosis not present

## 2015-03-10 DIAGNOSIS — Z880 Allergy status to penicillin: Secondary | ICD-10-CM | POA: Diagnosis not present

## 2015-03-10 DIAGNOSIS — Z79899 Other long term (current) drug therapy: Secondary | ICD-10-CM | POA: Insufficient documentation

## 2015-03-10 DIAGNOSIS — Z87891 Personal history of nicotine dependence: Secondary | ICD-10-CM | POA: Insufficient documentation

## 2015-03-10 DIAGNOSIS — J449 Chronic obstructive pulmonary disease, unspecified: Secondary | ICD-10-CM | POA: Insufficient documentation

## 2015-03-10 DIAGNOSIS — Y9389 Activity, other specified: Secondary | ICD-10-CM | POA: Insufficient documentation

## 2015-03-10 DIAGNOSIS — G629 Polyneuropathy, unspecified: Secondary | ICD-10-CM | POA: Insufficient documentation

## 2015-03-10 DIAGNOSIS — Y9289 Other specified places as the place of occurrence of the external cause: Secondary | ICD-10-CM | POA: Diagnosis not present

## 2015-03-10 DIAGNOSIS — S61552A Open bite of left wrist, initial encounter: Secondary | ICD-10-CM | POA: Diagnosis not present

## 2015-03-10 DIAGNOSIS — E079 Disorder of thyroid, unspecified: Secondary | ICD-10-CM | POA: Diagnosis not present

## 2015-03-10 DIAGNOSIS — Z7982 Long term (current) use of aspirin: Secondary | ICD-10-CM | POA: Diagnosis not present

## 2015-03-10 DIAGNOSIS — W540XXA Bitten by dog, initial encounter: Secondary | ICD-10-CM | POA: Diagnosis not present

## 2015-03-10 MED ORDER — ONDANSETRON 4 MG PO TBDP
4.0000 mg | ORAL_TABLET | Freq: Once | ORAL | Status: AC
  Administered 2015-03-10: 4 mg via ORAL
  Filled 2015-03-10: qty 1

## 2015-03-10 MED ORDER — TETANUS-DIPHTH-ACELL PERTUSSIS 5-2.5-18.5 LF-MCG/0.5 IM SUSP
0.5000 mL | Freq: Once | INTRAMUSCULAR | Status: AC
  Administered 2015-03-10: 0.5 mL via INTRAMUSCULAR
  Filled 2015-03-10: qty 0.5

## 2015-03-10 MED ORDER — HYDROMORPHONE HCL 1 MG/ML IJ SOLN
1.0000 mg | Freq: Once | INTRAMUSCULAR | Status: AC
  Administered 2015-03-10: 1 mg via INTRAMUSCULAR
  Filled 2015-03-10: qty 1

## 2015-03-10 MED ORDER — HYDROCODONE-ACETAMINOPHEN 5-325 MG PO TABS: 1.0000 | ORAL_TABLET | ORAL | Status: DC | PRN

## 2015-03-10 MED ORDER — POVIDONE-IODINE 10 % EX SOLN
CUTANEOUS | Status: AC
  Administered 2015-03-10: 20:00:00
  Filled 2015-03-10: qty 118

## 2015-03-10 MED ORDER — SULFAMETHOXAZOLE-TRIMETHOPRIM 800-160 MG PO TABS
1.0000 | ORAL_TABLET | Freq: Once | ORAL | Status: AC
  Administered 2015-03-10: 1 via ORAL
  Filled 2015-03-10: qty 1

## 2015-03-10 MED ORDER — CLINDAMYCIN HCL 150 MG PO CAPS: 450.0000 mg | ORAL_CAPSULE | Freq: Three times a day (TID) | ORAL | Status: DC

## 2015-03-10 MED ORDER — SULFAMETHOXAZOLE-TRIMETHOPRIM 800-160 MG PO TABS: 1.0000 | ORAL_TABLET | Freq: Two times a day (BID) | ORAL | Status: AC

## 2015-03-10 MED ORDER — CLINDAMYCIN HCL 150 MG PO CAPS
450.0000 mg | ORAL_CAPSULE | Freq: Once | ORAL | Status: AC
  Administered 2015-03-10: 450 mg via ORAL
  Filled 2015-03-10: qty 3

## 2015-03-10 NOTE — ED Provider Notes (Signed)
CSN: [id number]     Arrival date & time 03/10/15  4098 History  This chart was scribed for non-physician practitioner, Burgess Amor, PA-C working with Samuel Jester, DO, by Jarvis Morgan, ED Scribe. This patient was seen in room [facility]FT[facility] and the patient's care was started at 7:05 PM.       Chief Complaint  Patient presents with  . Animal Bite    The history is provided by the patient. No language interpreter was used.    HPI Comments: Kristen Ryan is a 59 y.o. female with a h/o asthma, COPD, bipolar 1 disorder and schizophrenia who presents to the Emergency Department complaining of a dog bite to left wrist and right hand onset 1 hour ago. Pt states she was breaking up a fight between her 2 female dogs and she got bit to her left wrist with 2 puncture marks present. There is no active bleeding at this time. Pt endorses that the dogs are her own and their vaccinations including rabies are UTD. She reports associated bruising and swelling to the left wrist. Pt has been icing the area with relief. She has not had any meds PTA. The pain is exacerbated and reproduced by movement of her left wrist. She has a h/o of a fracture to her left forearm and the surgery was performed by Dr. Romeo Apple. Pt is right hand dominant. Pt in unsure of tetanus vaccination status. She denies any numbness, weakness or sensation loss.   Past Medical History  Diagnosis Date  . Asthma   . Bipolar 1 disorder   . Schizophrenia   . Neuropathy   . Bronchitis   . Thyroid disease   . COPD (chronic obstructive pulmonary disease)    Past Surgical History  Procedure Laterality Date  . Abdominal hysterectomy    . Tonsillectomy    . Knee surgery    . Elbow surgery    . Foot surgery    . Cesarean section    . Broke left arm    . Colonoscopy N/A 08/30/2014    Procedure: COLONOSCOPY;  Surgeon: Malissa Hippo, MD;  Location: [facility];  Service: Endoscopy;  Laterality: N/A;  1200   Family History  Problem  Relation Age of Onset  . Dementia Mother   . Dementia Father   . Other Son     MVA accident   Social History  Substance Use Topics  . Smoking status: Former Smoker    Types: Cigarettes  . Smokeless tobacco: Never Used  . Alcohol Use: No   OB History    Gravida Para Term Preterm AB TAB SAB Ectopic Multiple Living   1 1        1      Review of Systems  Musculoskeletal: Positive for arthralgias. Negative for myalgias and joint swelling.  Skin: Positive for color change (left wrist) and wound (2 puncture wounds to left wrist).  Neurological: Negative for weakness and numbness.  All other systems reviewed and are negative.     Allergies  Aminophylline; Sumatriptan; Theophylline; Codeine; Depakote; Divalproex sodium; Doxycycline; Lamictal; Magnesium-containing compounds; Penicillins; Pentazocine lactate; Risperidone; Tetracyclines & related; Vistaril; Tetracycline; and Zyprexa  Home Medications   Prior to Admission medications   Medication Sig Start Date End Date Taking? Authorizing Provider  Albuterol Sulfate (PROVENTIL HFA IN) Inhale 1-2 puffs into the lungs as needed (shorntess of breath).    Yes Historical Provider, MD  ALPRAZolam Prudy Feeler) 1 MG tablet Take 1 mg by mouth 3 (three) times daily  as needed for sleep or anxiety.    Yes Historical Provider, MD  aspirin 325 MG tablet Take 325 mg by mouth daily.   Yes Historical Provider, MD  atorvastatin (LIPITOR) 40 MG tablet Take 1 tablet by mouth daily. 09/24/14  Yes Historical Provider, MD  budesonide-formoterol (SYMBICORT) 160-4.5 MCG/ACT inhaler Inhale 2 puffs into the lungs 2 (two) times daily. 03/25/14  Yes Lorre Nick, MD  carbamazepine (TEGRETOL) 200 MG tablet Take 1 tablet by mouth 2 (two) times daily. 10/15/14  Yes Historical Provider, MD  cyclobenzaprine (FLEXERIL) 10 MG tablet Take 10 mg by mouth 3 (three) times daily as needed for muscle spasms.   Yes Historical Provider, MD  dicyclomine (BENTYL) 10 MG capsule TAKE 1  CAPSULE BY MOUTH THREE TIMES DAILY AS NEEDED FOR SPASMS. 12/04/14  Yes Malissa Hippo, MD  gabapentin (NEURONTIN) 600 MG tablet Take 1 tablet by mouth 2 (two) times daily. 10/18/14  Yes Historical Provider, MD  levothyroxine (SYNTHROID, LEVOTHROID) 50 MCG tablet Take 50 mcg by mouth daily.   Yes Historical Provider, MD  lisinopril-hydrochlorothiazide (PRINZIDE,ZESTORETIC) 20-25 MG per tablet  02/18/15  Yes Historical Provider, MD  loratadine (ALLERGY RELIEF) 10 MG tablet Take 1 mg by mouth daily.   Yes Historical Provider, MD  omeprazole (PRILOSEC) 20 MG capsule Take 1 capsule by mouth daily. 10/18/14  Yes Historical Provider, MD  sodium chloride (OCEAN) 0.65 % SOLN nasal spray Place 1 spray into both nostrils as needed for congestion.   Yes Historical Provider, MD  ziprasidone (GEODON) 60 MG capsule Take 1 capsule by mouth 2 (two) times daily. 10/08/14  Yes Historical Provider, MD  clindamycin (CLEOCIN) 150 MG capsule Take 3 capsules (450 mg total) by mouth 3 (three) times daily. 03/10/15   Burgess Amor, PA-C  HYDROcodone-acetaminophen (NORCO/VICODIN) 5-325 MG per tablet Take 1 tablet by mouth every 4 (four) hours as needed. 03/10/15   Burgess Amor, PA-C  sulfamethoxazole-trimethoprim (BACTRIM DS,SEPTRA DS) 800-160 MG per tablet Take 1 tablet by mouth 2 (two) times daily. 03/10/15 03/17/15  Burgess Amor, PA-C   Triage Vitals: BP 136/87 mmHg  Pulse 104  Temp(Src) 99 F (37.2 C) (Oral)  Resp 16  Ht 5\' 7"  (1.702 m)  Wt 157 lb (71.215 kg)  BMI 24.58 kg/m2  SpO2 95%  Physical Exam  Constitutional: She appears well-developed and well-nourished.  HENT:  Head: Atraumatic.  Neck: Normal range of motion.  Cardiovascular:  Pulses equal bilaterally  Musculoskeletal: She exhibits tenderness.       Left wrist: She exhibits tenderness.  Moderate edema of left volar forearm. Sensation in fingertips are intact. Displays full ROM of fingers w/o pain. Pain in worsened with left wrist flexion  Neurological: She is alert.  She has normal strength. She displays normal reflexes. No sensory deficit.  Skin: Skin is warm and dry.  Multiple small puncture wounds to bilateral hands and left forearm  Psychiatric: She has a normal mood and affect.    ED Course  Procedures (including critical care time)  DIAGNOSTIC STUDIES: Oxygen Saturation is 95% on RA, normal by my interpretation.    COORDINATION OF CARE: 7:18 PM- Pt advised of plan for treatment and pt agrees. Will order tetanus vaccination. Will also order x-ray of left wrist.     Labs Review Labs Reviewed - No data to display  Imaging Review Dg Forearm Left  03/10/2015   CLINICAL DATA:  59 year old female with dog bite to the distal left for arm. Stop  EXAM: LEFT FOREARM - 2  VIEW  COMPARISON:  None.  FINDINGS: There is no acute osseous injury. There is soft tissue swelling of the distal forearm. No radiopaque foreign object identified.  IMPRESSION: Soft tissue swelling of the distal forearm. No radiopaque foreign object or acute fracture.   Electronically Signed   By: Elgie Collard M.D.   On: 03/10/2015 20:01   I have personally reviewed and evaluated these images and lab results as part of my medical decision-making.   EKG Interpretation None      MDM   Final diagnoses:  Animal bites    Wounds were soaked in saline and betadine solution,  Punctures flushed with saline syringe.  telfa and dressings applied.  Pt is PCN allergic, therefore started her on clindamycin and bactrim for dog bite coverage.  Hydrocodone prescribed.  Advised close f/u with pcp for a recheck in 2 days, returning here sooner for any worsened pain, swelling, redness.  She displays FROM of fingers, doubt tendon involvement.    I personally performed the services described in this documentation, which was scribed in my presence. The recorded information has been reviewed and is accurate.   Burgess Amor, PA-C 03/11/15 0138  Samuel Jester, DO 03/14/15 2152

## 2015-03-10 NOTE — ED Notes (Signed)
Dog bite to right hand and left wrist.  Bruising and puncture wounds

## 2015-03-10 NOTE — Discharge Instructions (Signed)

## 2015-03-26 DIAGNOSIS — E039 Hypothyroidism, unspecified: Secondary | ICD-10-CM | POA: Diagnosis not present

## 2015-03-26 DIAGNOSIS — M549 Dorsalgia, unspecified: Secondary | ICD-10-CM | POA: Diagnosis not present

## 2015-03-26 DIAGNOSIS — J449 Chronic obstructive pulmonary disease, unspecified: Secondary | ICD-10-CM | POA: Diagnosis not present

## 2015-03-26 DIAGNOSIS — G629 Polyneuropathy, unspecified: Secondary | ICD-10-CM | POA: Diagnosis not present

## 2015-03-26 DIAGNOSIS — Z23 Encounter for immunization: Secondary | ICD-10-CM | POA: Diagnosis not present

## 2015-03-27 ENCOUNTER — Other Ambulatory Visit (HOSPITAL_COMMUNITY): Payer: Self-pay | Admitting: Respiratory Therapy

## 2015-03-27 DIAGNOSIS — J441 Chronic obstructive pulmonary disease with (acute) exacerbation: Secondary | ICD-10-CM

## 2015-03-27 NOTE — Telephone Encounter (Signed)
Unable to reach patient. I have tried multiple times.

## 2015-04-05 ENCOUNTER — Emergency Department (HOSPITAL_COMMUNITY)
Admission: EM | Admit: 2015-04-05 | Discharge: 2015-04-05 | Disposition: A | Discharge status: Home / Self Care | Payer: Medicare Other | Attending: Emergency Medicine | Admitting: Emergency Medicine

## 2015-04-05 ENCOUNTER — Emergency Department (HOSPITAL_COMMUNITY): Payer: Medicare Other

## 2015-04-05 ENCOUNTER — Encounter (HOSPITAL_COMMUNITY): Payer: Self-pay

## 2015-04-05 DIAGNOSIS — S39012A Strain of muscle, fascia and tendon of lower back, initial encounter: Secondary | ICD-10-CM | POA: Diagnosis not present

## 2015-04-05 DIAGNOSIS — Z79899 Other long term (current) drug therapy: Secondary | ICD-10-CM | POA: Diagnosis not present

## 2015-04-05 DIAGNOSIS — F319 Bipolar disorder, unspecified: Secondary | ICD-10-CM | POA: Diagnosis not present

## 2015-04-05 DIAGNOSIS — Y998 Other external cause status: Secondary | ICD-10-CM | POA: Insufficient documentation

## 2015-04-05 DIAGNOSIS — Z880 Allergy status to penicillin: Secondary | ICD-10-CM | POA: Insufficient documentation

## 2015-04-05 DIAGNOSIS — Z87891 Personal history of nicotine dependence: Secondary | ICD-10-CM | POA: Insufficient documentation

## 2015-04-05 DIAGNOSIS — F209 Schizophrenia, unspecified: Secondary | ICD-10-CM | POA: Insufficient documentation

## 2015-04-05 DIAGNOSIS — Z7982 Long term (current) use of aspirin: Secondary | ICD-10-CM | POA: Diagnosis not present

## 2015-04-05 DIAGNOSIS — M549 Dorsalgia, unspecified: Secondary | ICD-10-CM | POA: Diagnosis not present

## 2015-04-05 DIAGNOSIS — E079 Disorder of thyroid, unspecified: Secondary | ICD-10-CM | POA: Diagnosis not present

## 2015-04-05 DIAGNOSIS — W1839XA Other fall on same level, initial encounter: Secondary | ICD-10-CM | POA: Insufficient documentation

## 2015-04-05 DIAGNOSIS — Z7951 Long term (current) use of inhaled steroids: Secondary | ICD-10-CM | POA: Diagnosis not present

## 2015-04-05 DIAGNOSIS — G629 Polyneuropathy, unspecified: Secondary | ICD-10-CM | POA: Insufficient documentation

## 2015-04-05 DIAGNOSIS — M545 Low back pain: Secondary | ICD-10-CM | POA: Diagnosis not present

## 2015-04-05 DIAGNOSIS — J449 Chronic obstructive pulmonary disease, unspecified: Secondary | ICD-10-CM | POA: Diagnosis not present

## 2015-04-05 DIAGNOSIS — S3992XA Unspecified injury of lower back, initial encounter: Secondary | ICD-10-CM | POA: Diagnosis present

## 2015-04-05 DIAGNOSIS — Y9389 Activity, other specified: Secondary | ICD-10-CM | POA: Insufficient documentation

## 2015-04-05 DIAGNOSIS — Z792 Long term (current) use of antibiotics: Secondary | ICD-10-CM | POA: Diagnosis not present

## 2015-04-05 DIAGNOSIS — Y92009 Unspecified place in unspecified non-institutional (private) residence as the place of occurrence of the external cause: Secondary | ICD-10-CM | POA: Insufficient documentation

## 2015-04-05 MED ORDER — OXYCODONE-ACETAMINOPHEN 5-325 MG PO TABS
1.0000 | ORAL_TABLET | Freq: Once | ORAL | Status: AC
  Administered 2015-04-05: 1 via ORAL
  Filled 2015-04-05: qty 1

## 2015-04-05 MED ORDER — OXYCODONE-ACETAMINOPHEN 5-325 MG PO TABS: 1.0000 | ORAL_TABLET | Freq: Four times a day (QID) | ORAL | Status: DC | PRN

## 2015-04-05 NOTE — Discharge Instructions (Signed)

## 2015-04-05 NOTE — ED Provider Notes (Signed)
CSN: [id number]     Arrival date & time 04/05/15  2033 History   First MD Initiated Contact with Patient 04/05/15 2042     Chief Complaint  Patient presents with  . Back Pain     (Consider location/radiation/quality/duration/timing/severity/associated sxs/prior Treatment) HPI   ANTONEA GAUT is a 59 y.o. female with hx of fibromyalgia, back pain, and ambulates with a cane.  She presents to the Emergency Department complaining of low to mid back pain since a fall that occurred yesterday.  She states that she fell inside the home, but she is unsure of how the fall occurred.  Since the fall, she reports having worsening of her back pain with movement and bending, improves somewhat with rest.  she took tylenol and her gabapentin without relief.   She states that her primary doctor will not prescribe pain medication, so she did not follow up with him.   She denies LOC or head injury, dizziness, visual changes, vomiting, numbness or weakness of the LE's, abdominal pain, urine or bowel retention/incontinence.   Past Medical History  Diagnosis Date  . Asthma   . Bipolar 1 disorder   . Schizophrenia   . Neuropathy   . Bronchitis   . Thyroid disease   . COPD (chronic obstructive pulmonary disease)    Past Surgical History  Procedure Laterality Date  . Abdominal hysterectomy    . Tonsillectomy    . Knee surgery    . Elbow surgery    . Foot surgery    . Cesarean section    . Broke left arm    . Colonoscopy N/A 08/30/2014    Procedure: COLONOSCOPY;  Surgeon: Malissa Hippo, MD;  Location: [facility];  Service: Endoscopy;  Laterality: N/A;  1200   Family History  Problem Relation Age of Onset  . Dementia Mother   . Dementia Father   . Other Son     MVA accident   Social History  Substance Use Topics  . Smoking status: Former Smoker    Types: Cigarettes  . Smokeless tobacco: Never Used  . Alcohol Use: No   OB History    Gravida Para Term Preterm AB TAB SAB Ectopic Multiple  Living   Review of Systems  Constitutional: Negative for fever.  Respiratory: Negative for shortness of breath.   Gastrointestinal: Negative for vomiting, abdominal pain and constipation.  Genitourinary: Negative for dysuria, hematuria, flank pain, decreased urine volume and difficulty urinating.  Musculoskeletal: Positive for back pain. Negative for joint swelling.  Skin: Negative for rash.  Neurological: Negative for weakness and numbness.  All other systems reviewed and are negative.     Allergies  Aminophylline; Sumatriptan; Theophylline; Codeine; Depakote; Divalproex sodium; Doxycycline; Lamictal; Magnesium-containing compounds; Penicillins; Pentazocine lactate; Risperidone; Tetracyclines & related; Vistaril; Tetracycline; and Zyprexa  Home Medications   Prior to Admission medications   Medication Sig Start Date End Date Taking? Authorizing Provider  Albuterol Sulfate (PROVENTIL HFA IN) Inhale 1-2 puffs into the lungs as needed (shorntess of breath).     Historical Provider, MD  ALPRAZolam Prudy Feeler) 1 MG tablet Take 1 mg by mouth 3 (three) times daily as needed for sleep or anxiety.     Historical Provider, MD  aspirin 325 MG tablet Take 325 mg by mouth daily.    Historical Provider, MD  atorvastatin (LIPITOR) 40 MG tablet Take 1 tablet by mouth daily. 09/24/14   Historical Provider,  MD  budesonide-formoterol (SYMBICORT) 160-4.5 MCG/ACT inhaler Inhale 2 puffs into the lungs 2 (two) times daily. 03/25/14   Lorre Nick, MD  carbamazepine (TEGRETOL) 200 MG tablet Take 1 tablet by mouth 2 (two) times daily. 10/15/14   Historical Provider, MD  clindamycin (CLEOCIN) 150 MG capsule Take 3 capsules (450 mg total) by mouth 3 (three) times daily. 03/10/15   Burgess Amor, PA-C  cyclobenzaprine (FLEXERIL) 10 MG tablet Take 10 mg by mouth 3 (three) times daily as needed for muscle spasms.    Historical Provider, MD  dicyclomine (BENTYL) 10 MG capsule TAKE 1 CAPSULE BY MOUTH THREE  TIMES DAILY AS NEEDED FOR SPASMS. 12/04/14   Malissa Hippo, MD  gabapentin (NEURONTIN) 600 MG tablet Take 1 tablet by mouth 2 (two) times daily. 10/18/14   Historical Provider, MD  HYDROcodone-acetaminophen (NORCO/VICODIN) 5-325 MG per tablet Take 1 tablet by mouth every 4 (four) hours as needed. 03/10/15   Burgess Amor, PA-C  levothyroxine (SYNTHROID, LEVOTHROID) 50 MCG tablet Take 50 mcg by mouth daily.    Historical Provider, MD  lisinopril-hydrochlorothiazide (PRINZIDE,ZESTORETIC) 20-25 MG per tablet  02/18/15   Historical Provider, MD  loratadine (ALLERGY RELIEF) 10 MG tablet Take 1 mg by mouth daily.    Historical Provider, MD  omeprazole (PRILOSEC) 20 MG capsule Take 1 capsule by mouth daily. 10/18/14   Historical Provider, MD  sodium chloride (OCEAN) 0.65 % SOLN nasal spray Place 1 spray into both nostrils as needed for congestion.    Historical Provider, MD  ziprasidone (GEODON) 60 MG capsule Take 1 capsule by mouth 2 (two) times daily. 10/08/14   Historical Provider, MD   BP 127/67 mmHg  Pulse 94  Temp(Src) 97.9 F (36.6 C) (Oral)  Resp 18  Ht  (1.727 m)  Wt 162 lb (73.483 kg)  BMI 24.64 kg/m2  SpO2 98% Physical Exam  Constitutional: She is oriented to person, place, and time. She appears well-developed and well-nourished. No distress.  HENT:  Head: Normocephalic and atraumatic.  Neck: Normal range of motion. Neck supple.  Cardiovascular: Normal rate, regular rhythm, normal heart sounds and intact distal pulses.   No murmur heard. Pulmonary/Chest: Effort normal and breath sounds normal. No respiratory distress.  Abdominal: Soft. She exhibits no distension. There is no tenderness.  Musculoskeletal: Normal range of motion. She exhibits tenderness. She exhibits no edema.       Lumbar back: She exhibits tenderness and pain. She exhibits normal range of motion, no swelling, no deformity, no laceration and normal pulse.  ttp of the thoracolumbar spine and adjacent paraspinal muscles.    DP pulses are brisk and symmetrical.  Distal sensation intact.  Hip Flexors/Extensors are intact.  Pt has 5/5 strength against resistance of bilateral lower extremities.     Neurological: She is alert and oriented to person, place, and time. She has normal strength. No sensory deficit. She exhibits normal muscle tone. Coordination and gait normal.  Reflex Scores:      Patellar reflexes are 2+ on the right side and 2+ on the left side.      Achilles reflexes are 2+ on the right side and 2+ on the left side. Skin: Skin is warm and dry. No rash noted.  Psychiatric: She has a normal mood and affect.  Nursing note and vitals reviewed.   ED Course  Procedures (including critical care time) Labs Review Labs Reviewed - No data to display  Imaging Review Dg Thoracic Spine 2 View  04/05/2015   CLINICAL DATA:  Mid and low back pain for 2 days  EXAM: THORACIC SPINE 2 VIEWS  COMPARISON:  Chest x-ray Nov 06, 2014  FINDINGS: There is no evidence of thoracic spine fracture. There is scoliosis of spine. There is chronic compression deformity of mid thoracic vertebral body unchanged compared to prior exam.  IMPRESSION: No acute fracture or dislocation.   Electronically Signed   By: Sherian Rein M.D.   On: 04/05/2015 21:47   Dg Lumbar Spine Complete  04/05/2015   CLINICAL DATA:  Fall with mid to lower back pain for 2 days. Initial encounter.  EXAM: LUMBAR SPINE - COMPLETE 4+ VIEW  COMPARISON:  08/16/2014  FINDINGS: No evidence of acute fracture or traumatic malalignment.  No focal or notable disc narrowing. No evidence of spondylolysis. No indication of bone lesion or endplate erosion. Osteopenia.  IMPRESSION: No acute finding or change from prior.   Electronically Signed   By: Marnee Spring M.D.   On: 04/05/2015 21:47   I have personally reviewed and evaluated these images and lab results as part of my medical decision-making.   EKG Interpretation None      MDM   Final diagnoses:  Lumbar strain,  initial encounter   Pt ambulated to X ray.    Pt is ambulatory with her cane at baseline, steady gait.  No focal neuro deficits on exam.  No concerning sx's for emergent neuorlogical or infectious process.  Pt agrees to symptomatic tx and close PMD f/u.  She is well appearing and stable for d/c    Pauline Aus, PA-C 04/06/15 0108  Gilda Crease, MD 04/07/15 [phone]

## 2015-04-05 NOTE — ED Notes (Signed)
Patient states she fell yesterday. Today c/o lower/mid back pain. Patient states she is unsure how she fell.

## 2015-04-06 ENCOUNTER — Other Ambulatory Visit (INDEPENDENT_AMBULATORY_CARE_PROVIDER_SITE_OTHER): Payer: Self-pay | Admitting: Internal Medicine

## 2015-04-10 ENCOUNTER — Ambulatory Visit (HOSPITAL_COMMUNITY): Admission: RE | Admit: 2015-04-10 | Payer: Medicare Other | Source: Ambulatory Visit

## 2015-04-10 DIAGNOSIS — R197 Diarrhea, unspecified: Secondary | ICD-10-CM | POA: Diagnosis not present

## 2015-04-10 DIAGNOSIS — K921 Melena: Secondary | ICD-10-CM | POA: Diagnosis not present

## 2015-04-12 ENCOUNTER — Telehealth (INDEPENDENT_AMBULATORY_CARE_PROVIDER_SITE_OTHER): Payer: Self-pay | Admitting: *Deleted

## 2015-04-12 ENCOUNTER — Telehealth (INDEPENDENT_AMBULATORY_CARE_PROVIDER_SITE_OTHER): Payer: Self-pay | Admitting: Internal Medicine

## 2015-04-12 DIAGNOSIS — A09 Infectious gastroenteritis and colitis, unspecified: Secondary | ICD-10-CM

## 2015-04-12 MED ORDER — METRONIDAZOLE 500 MG PO TABS
500.0000 mg | ORAL_TABLET | Freq: Three times a day (TID) | ORAL | Status: DC
Start: 2015-04-12 — End: 2015-08-03

## 2015-04-12 NOTE — Telephone Encounter (Signed)
Patient was called on behalf of Delrae Rend as she had been able to reach patient. A message was left on the patient's voice mail with the result of the GI Pathogen and patient was advised that a medication had been called to her pharmacy.

## 2015-04-12 NOTE — Telephone Encounter (Signed)
Rx for flagyl sent to her office.

## 2015-04-16 ENCOUNTER — Telehealth (INDEPENDENT_AMBULATORY_CARE_PROVIDER_SITE_OTHER): Payer: Self-pay | Admitting: *Deleted

## 2015-04-16 DIAGNOSIS — A09 Infectious gastroenteritis and colitis, unspecified: Secondary | ICD-10-CM

## 2015-04-16 MED ORDER — VANCOMYCIN HCL 125 MG PO CAPS: 125.0000 mg | ORAL_CAPSULE | Freq: Four times a day (QID) | ORAL | Status: DC

## 2015-04-16 NOTE — Telephone Encounter (Signed)
Thinks she's having alelrgic reaction to meds you prescribed, legs and hands swelling, pharmacist told her to stop medication and call you, please call her at [phone]

## 2015-04-16 NOTE — Telephone Encounter (Signed)
Hand and feet are swelling. Will stop the Flagyl and stat on Vancomycin  qid x 10 days.

## 2015-04-16 NOTE — Telephone Encounter (Signed)
I have spoken with patient today. Rx for Vancomycin sent to her pharmacy for C-difficile

## 2015-04-16 NOTE — Telephone Encounter (Signed)
Rx for Vancomycin called to her pharmacy. She has stopped the Flagyl

## 2015-04-17 ENCOUNTER — Telehealth (INDEPENDENT_AMBULATORY_CARE_PROVIDER_SITE_OTHER): Payer: Self-pay | Admitting: *Deleted

## 2015-04-17 NOTE — Telephone Encounter (Signed)
Opened in error

## 2015-04-17 NOTE — Telephone Encounter (Signed)
The RX you called in is over $500.00 and that is too expensive, please call her about another medicine -- [phone]

## 2015-04-23 NOTE — Telephone Encounter (Signed)
Kristen Ryan is work on a PA

## 2015-04-26 ENCOUNTER — Telehealth (INDEPENDENT_AMBULATORY_CARE_PROVIDER_SITE_OTHER): Payer: Self-pay | Admitting: Internal Medicine

## 2015-04-26 NOTE — Telephone Encounter (Signed)
Spoke with patient this am. Vancomycin has been approved. She can pick it up at her drug store.

## 2015-05-02 ENCOUNTER — Telehealth (INDEPENDENT_AMBULATORY_CARE_PROVIDER_SITE_OTHER): Payer: Self-pay | Admitting: *Deleted

## 2015-05-02 NOTE — Telephone Encounter (Signed)
Patient was reassured. Has only been in [facility]. For 5 days

## 2015-05-02 NOTE — Telephone Encounter (Signed)
Kristen Ryan would like to speak with [name]. She is still going to the bathroom and taking the medicine 4 times daily. This is not helping. The return phone number is [phone].

## 2015-05-13 ENCOUNTER — Other Ambulatory Visit (INDEPENDENT_AMBULATORY_CARE_PROVIDER_SITE_OTHER): Payer: Self-pay | Admitting: Internal Medicine

## 2015-05-18 ENCOUNTER — Emergency Department (HOSPITAL_COMMUNITY)
Admission: EM | Admit: 2015-05-18 | Discharge: 2015-05-18 | Disposition: A | Discharge status: Home / Self Care | Payer: Medicare Other | Attending: Emergency Medicine | Admitting: Emergency Medicine

## 2015-05-18 ENCOUNTER — Encounter (HOSPITAL_COMMUNITY): Payer: Self-pay | Admitting: Emergency Medicine

## 2015-05-18 DIAGNOSIS — F319 Bipolar disorder, unspecified: Secondary | ICD-10-CM | POA: Diagnosis not present

## 2015-05-18 DIAGNOSIS — Z880 Allergy status to penicillin: Secondary | ICD-10-CM | POA: Insufficient documentation

## 2015-05-18 DIAGNOSIS — J449 Chronic obstructive pulmonary disease, unspecified: Secondary | ICD-10-CM | POA: Diagnosis not present

## 2015-05-18 DIAGNOSIS — L253 Unspecified contact dermatitis due to other chemical products: Secondary | ICD-10-CM | POA: Diagnosis not present

## 2015-05-18 DIAGNOSIS — G629 Polyneuropathy, unspecified: Secondary | ICD-10-CM | POA: Diagnosis not present

## 2015-05-18 DIAGNOSIS — E079 Disorder of thyroid, unspecified: Secondary | ICD-10-CM | POA: Insufficient documentation

## 2015-05-18 DIAGNOSIS — Z7951 Long term (current) use of inhaled steroids: Secondary | ICD-10-CM | POA: Insufficient documentation

## 2015-05-18 DIAGNOSIS — L235 Allergic contact dermatitis due to other chemical products: Secondary | ICD-10-CM | POA: Diagnosis not present

## 2015-05-18 DIAGNOSIS — Z7982 Long term (current) use of aspirin: Secondary | ICD-10-CM | POA: Diagnosis not present

## 2015-05-18 DIAGNOSIS — Z87891 Personal history of nicotine dependence: Secondary | ICD-10-CM | POA: Diagnosis not present

## 2015-05-18 DIAGNOSIS — L299 Pruritus, unspecified: Secondary | ICD-10-CM | POA: Diagnosis present

## 2015-05-18 DIAGNOSIS — F209 Schizophrenia, unspecified: Secondary | ICD-10-CM | POA: Diagnosis not present

## 2015-05-18 DIAGNOSIS — Z792 Long term (current) use of antibiotics: Secondary | ICD-10-CM | POA: Insufficient documentation

## 2015-05-18 DIAGNOSIS — Z79899 Other long term (current) drug therapy: Secondary | ICD-10-CM | POA: Insufficient documentation

## 2015-05-18 MED ORDER — CEPHALEXIN 500 MG PO CAPS
500.0000 mg | ORAL_CAPSULE | Freq: Once | ORAL | Status: AC
  Administered 2015-05-18: 500 mg via ORAL
  Filled 2015-05-18: qty 1

## 2015-05-18 MED ORDER — PREDNISONE 10 MG PO TABS
60.0000 mg | ORAL_TABLET | Freq: Once | ORAL | Status: AC
Start: 2015-05-18 — End: 2015-05-18
  Administered 2015-05-18: 60 mg via ORAL
  Filled 2015-05-18 (×2): qty 1

## 2015-05-18 MED ORDER — PREDNISONE 10 MG PO TABS: ORAL_TABLET | ORAL | Status: DC

## 2015-05-18 MED ORDER — DIPHENHYDRAMINE HCL 25 MG PO CAPS
25.0000 mg | ORAL_CAPSULE | Freq: Once | ORAL | Status: AC
  Administered 2015-05-18: 25 mg via ORAL
  Filled 2015-05-18: qty 1

## 2015-05-18 MED ORDER — CEPHALEXIN 500 MG PO CAPS: 500.0000 mg | ORAL_CAPSULE | Freq: Four times a day (QID) | ORAL | Status: DC

## 2015-05-18 NOTE — ED Notes (Signed)
Hair dyed on Thursday and notice rash and facial burning Friday.  Scalp and face is burning.  Denies any SOB or discomfort.  No oral swelling noted.

## 2015-05-18 NOTE — ED Notes (Signed)
Discharge papers given to pt and scripts reviewed - Pt instructed not to pop any blisters .verbalized understanding

## 2015-05-18 NOTE — ED Provider Notes (Signed)
CSN: [id number]     Arrival date & time 05/18/15  1703 History   None    Chief Complaint  Patient presents with  . Allergic Reaction     (Consider location/radiation/quality/duration/timing/severity/associated sxs/prior Treatment) HPI Comments: Patient is a 58 year old female who presents to the emergency department with a complaint of adverse reaction to hair dye.  The patient states that 2-3 days ago she was applying a hair dye present. She noted a reaction of burning and itching of the scalp. This was followed by a pain in the center of her scalp accompanied by itching. She didn't noted the redness of her cheeks and forehead and itching and burning on them as well. She denies any high fever. She states that she has some soreness of her neck and thinks that she may have some swollen lymph nodes. His been no difficulty with breathing. No swelling about the mouth or tongue. No difficulty with breathing, and no difficulty with swallowing.  The history is provided by the patient.    Past Medical History  Diagnosis Date  . Asthma   . Bipolar 1 disorder (HCC)   . Schizophrenia (HCC)   . Neuropathy (HCC)   . Bronchitis   . Thyroid disease   . COPD (chronic obstructive pulmonary disease) [facility])    Past Surgical History  Procedure Laterality Date  . Abdominal hysterectomy    . Tonsillectomy    . Knee surgery    . Elbow surgery    . Foot surgery    . Cesarean section    . Broke left arm    . Colonoscopy N/A 08/30/2014    Procedure: COLONOSCOPY;  Surgeon: Malissa Hippo, MD;  Location: [facility];  Service: Endoscopy;  Laterality: N/A;  1200   Family History  Problem Relation Age of Onset  . Dementia Mother   . Dementia Father   . Other Son     MVA accident   Social History  Substance Use Topics  . Smoking status: Former Smoker    Types: Cigarettes  . Smokeless tobacco: Never Used  . Alcohol Use: No   OB History    Gravida Para Term Preterm AB TAB SAB Ectopic Multiple  Living   Review of Systems  Constitutional: Negative for fever.  Respiratory: Negative for cough, shortness of breath and wheezing.   Skin: Positive for rash.  All other systems reviewed and are negative.     Allergies  Aminophylline; Sumatriptan; Theophylline; Codeine; Depakote; Divalproex sodium; Doxycycline; Lamictal; Magnesium-containing compounds; Other; Penicillins; Pentazocine lactate; Risperidone; Tetracyclines & related; Vistaril; Tetracycline; and Zyprexa  Home Medications   Prior to Admission medications   Medication Sig Start Date End Date Taking? Authorizing Provider  Albuterol Sulfate (PROVENTIL HFA IN) Inhale 1-2 puffs into the lungs as needed (shorntess of breath).     Historical Provider, MD  ALPRAZolam Prudy Feeler) 1 MG tablet Take 1 mg by mouth 3 (three) times daily as needed for sleep or anxiety.     Historical Provider, MD  aspirin 325 MG tablet Take 325 mg by mouth daily.    Historical Provider, MD  atorvastatin (LIPITOR) 40 MG tablet Take 1 tablet by mouth daily. 09/24/14   Historical Provider, MD  budesonide-formoterol (SYMBICORT) 160-4.5 MCG/ACT inhaler Inhale 2 puffs into the lungs 2 (two) times daily. 03/25/14   Lorre Nick, MD  carbamazepine (TEGRETOL) 200 MG tablet Take 1 tablet by mouth 2 (two) times  daily. 10/15/14   Historical Provider, MD  clindamycin (CLEOCIN) 150 MG capsule Take 3 capsules (450 mg total) by mouth 3 (three) times daily. 03/10/15   Burgess AmorJulie Idol, PA-C  cyclobenzaprine (FLEXERIL) 10 MG tablet Take 10 mg by mouth 3 (three) times daily as needed for muscle spasms.    Historical Provider, MD  dicyclomine (BENTYL) 10 MG capsule TAKE 1 CAPSULE BY MOUTH THREE TIMES DAILY AS NEEDED FOR SPASMS. 05/13/15   Len Blalockerri L Setzer, NP  gabapentin (NEURONTIN) 600 MG tablet Take 1 tablet by mouth 2 (two) times daily. 10/18/14   Historical Provider, MD  HYDROcodone-acetaminophen (NORCO/VICODIN) 5-325 MG per tablet Take 1 tablet by mouth every 4 (four)  hours as needed. 03/10/15   Burgess AmorJulie Idol, PA-C  levothyroxine (SYNTHROID, LEVOTHROID) 50 MCG tablet Take 50 mcg by mouth daily.    Historical Provider, MD  lisinopril-hydrochlorothiazide (PRINZIDE,ZESTORETIC) 20-25 MG per tablet  02/18/15   Historical Provider, MD  loratadine (ALLERGY RELIEF) 10 MG tablet Take 1 mg by mouth daily.    Historical Provider, MD  metroNIDAZOLE (FLAGYL) 500 MG tablet Take 1 tablet (500 mg total) by mouth 3 (three) times daily. 04/12/15   Len Blalockerri L Setzer, NP  omeprazole (PRILOSEC) 20 MG capsule Take 1 capsule by mouth daily. 10/18/14   Historical Provider, MD  oxyCODONE-acetaminophen (PERCOCET/ROXICET) 5-325 MG tablet Take 1 tablet by mouth every 6 (six) hours as needed. 04/05/15   Tammy Triplett, PA-C  sodium chloride (OCEAN) 0.65 % SOLN nasal spray Place 1 spray into both nostrils as needed for congestion.    Historical Provider, MD  vancomycin (VANCOCIN) 125 MG capsule Take 1 capsule (125 mg total) by mouth 4 (four) times daily. 04/16/15   Len Blalockerri L Setzer, NP  ziprasidone (GEODON) 60 MG capsule Take 1 capsule by mouth 2 (two) times daily. 10/08/14   Historical Provider, MD   BP 128/78 mmHg  Pulse 91  Temp(Src) 98.2 F (36.8 C) (Oral)  Resp 20  Ht 5\' 7"  (1.702 m)  Wt 150 lb (68.04 kg)  BMI 23.49 kg/m2  SpO2 100% Physical Exam  Constitutional: She is oriented to person, place, and time. She appears well-developed and well-nourished.  Non-toxic appearance.  HENT:  Head: Normocephalic.  Right Ear: Tympanic membrane and external ear normal.  Left Ear: Tympanic membrane and external ear normal.  There are a few areas of irritation of the scalp. There is 1 less than dime size lesion in the center of scalp with a scab present. No red streaks noted at this point. The area is tender to touch.  There is increased redness and some fine macular rash over the forehead and cheeks. The nose is spared, the lips are spared, the chin is spared.  Eyes: EOM and lids are normal. Pupils are  equal, round, and reactive to light.  There is no swelling of the eyelids. There is no increased redness or injection of the conjunctiva. There is no evidence for ulcer or burn involving the cornea.  Neck: Normal range of motion. Neck supple. Carotid bruit is not present.  Cardiovascular: Normal rate, regular rhythm, normal heart sounds, intact distal pulses and normal pulses.   Pulmonary/Chest: Breath sounds normal. No respiratory distress. She has no wheezes. She has no rales.  Abdominal: Soft. Bowel sounds are normal. There is no tenderness. There is no guarding.  Musculoskeletal: Normal range of motion.  Lymphadenopathy:       Head (right side): No submandibular adenopathy present.       Head (left side):  No submandibular adenopathy present.    She has cervical adenopathy.  Neurological: She is alert and oriented to person, place, and time. She has normal strength. No cranial nerve deficit or sensory deficit.  Skin: Skin is warm and dry.  Psychiatric: She has a normal mood and affect. Her speech is normal.  Nursing note and vitals reviewed.   ED Course  Procedures (including critical care time) Labs Review Labs Reviewed - No data to display  Imaging Review No results found. I have personally reviewed and evaluated these images and lab results as part of my medical decision-making.   EKG Interpretation None      MDM  The examination favors a contact dermatitis related to hear dye. The patient will be treated with medicated [place] shampoo. Keflex, prednisone taper, Allegra during the day for itching and burning, and Benadryl at bedtime for itching and burning.    Final diagnoses:  Contact dermatitis due to chemicals    **I have reviewed nursing notes, vital signs, and all appropriate lab and imaging results for this patient.Ivery Quale, PA-C 05/18/15 1804  Lavera Guise, MD 05/19/15 [phone]

## 2015-05-18 NOTE — Discharge Instructions (Signed)
Patient was medicated [place] shampoo daily for the next 5-7 days. Please use Keflex with breakfast, lunch, dinner, and bedtime until all taken. Please use prednisone taper as prescribed. May use Allegra for itching during the day, may use Benadryl for itching bedtime if needed. Contact Dermatitis Dermatitis is redness, soreness, and swelling (inflammation) of the skin. Contact dermatitis is a reaction to certain substances that touch the skin. You either touched something that irritated your skin, or you have allergies to something you touched.  HOME CARE  Skin Care  Moisturize your skin as needed.  Apply cool compresses to the affected areas.   Try taking a bath with:   Epsom salts. Follow the instructions on the package. You can get these at a pharmacy or grocery store.   Baking soda. Pour a small amount into the bath as told by your doctor.   Colloidal oatmeal. Follow the instructions on the package. You can get this at a pharmacy or grocery store.   Try applying baking soda paste to your skin. Stir water into baking soda until it looks like paste.  Do not scratch your skin.   Bathe less often.  Bathe in lukewarm water. Avoid using hot water.  Medicines  Take or apply over-the-counter and prescription medicines only as told by your doctor.   If you were prescribed an antibiotic medicine, take or apply your antibiotic as told by your doctor. Do not stop taking the antibiotic even if your condition starts to get better. General Instructions  Keep all follow-up visits as told by your doctor. This is important.   Avoid the substance that caused your reaction. If you do not know what caused it, keep a journal to try to track what caused it. Write down:   What you eat.   What cosmetic products you use.   What you drink.   What you wear in the affected area. This includes jewelry.   If you were given a bandage (dressing), take care of it as told by your doctor.  This includes when to change and remove it.  GET HELP IF:   You do not get better with treatment.   Your condition gets worse.   You have signs of infection such as:  Swelling.  Tenderness.  Redness.  Soreness.  Warmth.   You have a fever.   You have new symptoms.  GET HELP RIGHT AWAY IF:   You have a very bad headache.  You have neck pain.  Your neck is stiff.   You throw up (vomit).   You feel very sleepy.   You see red streaks coming from the affected area.   Your bone or joint underneath the affected area becomes painful after the skin has healed.   The affected area turns darker.   You have trouble breathing.    This information is not intended to replace advice given to you by your health care provider. Make sure you discuss any questions you have with your health care provider.   Document Released: 04/19/2009 Document Revised: 03/13/2015 Document Reviewed: 11/07/2014 Elsevier Interactive Patient Education [employer]2016 [employer] Inc[employer]

## 2015-06-10 ENCOUNTER — Telehealth (INDEPENDENT_AMBULATORY_CARE_PROVIDER_SITE_OTHER): Payer: Self-pay | Admitting: *Deleted

## 2015-06-10 ENCOUNTER — Telehealth (INDEPENDENT_AMBULATORY_CARE_PROVIDER_SITE_OTHER): Payer: Self-pay | Admitting: Internal Medicine

## 2015-06-10 DIAGNOSIS — R197 Diarrhea, unspecified: Secondary | ICD-10-CM

## 2015-06-10 NOTE — Telephone Encounter (Signed)
Patient called and left message she is having diarrhea and wants you to call her at [phone]

## 2015-06-10 NOTE — Telephone Encounter (Signed)
Will get a GI pathogen. Further recommendations to follow.

## 2015-06-10 NOTE — Telephone Encounter (Signed)
She is having diarrhea. Hx of C-diff. Will get GI pathogen

## 2015-06-12 ENCOUNTER — Emergency Department (HOSPITAL_COMMUNITY)
Admission: EM | Admit: 2015-06-12 | Discharge: 2015-06-12 | Disposition: A | Discharge status: Home / Self Care | Payer: Medicare Other | Attending: Emergency Medicine | Admitting: Emergency Medicine

## 2015-06-12 ENCOUNTER — Encounter (HOSPITAL_COMMUNITY): Payer: Self-pay | Admitting: *Deleted

## 2015-06-12 ENCOUNTER — Emergency Department (HOSPITAL_COMMUNITY): Payer: Medicare Other

## 2015-06-12 DIAGNOSIS — M79671 Pain in right foot: Secondary | ICD-10-CM | POA: Diagnosis not present

## 2015-06-12 DIAGNOSIS — F209 Schizophrenia, unspecified: Secondary | ICD-10-CM | POA: Diagnosis not present

## 2015-06-12 DIAGNOSIS — Z87891 Personal history of nicotine dependence: Secondary | ICD-10-CM | POA: Diagnosis not present

## 2015-06-12 DIAGNOSIS — Z792 Long term (current) use of antibiotics: Secondary | ICD-10-CM | POA: Insufficient documentation

## 2015-06-12 DIAGNOSIS — Z79899 Other long term (current) drug therapy: Secondary | ICD-10-CM | POA: Insufficient documentation

## 2015-06-12 DIAGNOSIS — F0390 Unspecified dementia without behavioral disturbance: Secondary | ICD-10-CM | POA: Diagnosis not present

## 2015-06-12 DIAGNOSIS — Z8669 Personal history of other diseases of the nervous system and sense organs: Secondary | ICD-10-CM | POA: Insufficient documentation

## 2015-06-12 DIAGNOSIS — F319 Bipolar disorder, unspecified: Secondary | ICD-10-CM | POA: Diagnosis not present

## 2015-06-12 DIAGNOSIS — Y93E1 Activity, personal bathing and showering: Secondary | ICD-10-CM | POA: Insufficient documentation

## 2015-06-12 DIAGNOSIS — Y998 Other external cause status: Secondary | ICD-10-CM | POA: Insufficient documentation

## 2015-06-12 DIAGNOSIS — Y9289 Other specified places as the place of occurrence of the external cause: Secondary | ICD-10-CM | POA: Diagnosis not present

## 2015-06-12 DIAGNOSIS — W501XXA Accidental kick by another person, initial encounter: Secondary | ICD-10-CM | POA: Diagnosis not present

## 2015-06-12 DIAGNOSIS — E079 Disorder of thyroid, unspecified: Secondary | ICD-10-CM | POA: Insufficient documentation

## 2015-06-12 DIAGNOSIS — J449 Chronic obstructive pulmonary disease, unspecified: Secondary | ICD-10-CM | POA: Insufficient documentation

## 2015-06-12 DIAGNOSIS — S8991XA Unspecified injury of right lower leg, initial encounter: Secondary | ICD-10-CM | POA: Diagnosis not present

## 2015-06-12 DIAGNOSIS — S8011XA Contusion of right lower leg, initial encounter: Secondary | ICD-10-CM | POA: Insufficient documentation

## 2015-06-12 DIAGNOSIS — Z7952 Long term (current) use of systemic steroids: Secondary | ICD-10-CM | POA: Diagnosis not present

## 2015-06-12 DIAGNOSIS — Z880 Allergy status to penicillin: Secondary | ICD-10-CM | POA: Diagnosis not present
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
## 2015-06-12 NOTE — Discharge Instructions (Signed)
Elevate the leg as often as possible, wear the ace wrap for comfort. Return as needed for worsening symptoms.
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
## 2015-06-13 ENCOUNTER — Other Ambulatory Visit (INDEPENDENT_AMBULATORY_CARE_PROVIDER_SITE_OTHER): Payer: Self-pay | Admitting: Internal Medicine

## 2015-06-18 DIAGNOSIS — K219 Gastro-esophageal reflux disease without esophagitis: Secondary | ICD-10-CM | POA: Diagnosis not present

## 2015-06-18 DIAGNOSIS — M545 Low back pain: Secondary | ICD-10-CM | POA: Diagnosis not present

## 2015-06-18 DIAGNOSIS — R5382 Chronic fatigue, unspecified: Secondary | ICD-10-CM | POA: Diagnosis not present

## 2015-06-18 DIAGNOSIS — M2550 Pain in unspecified joint: Secondary | ICD-10-CM | POA: Diagnosis not present

## 2015-06-25 DIAGNOSIS — R5382 Chronic fatigue, unspecified: Secondary | ICD-10-CM | POA: Diagnosis not present

## 2015-06-25 DIAGNOSIS — D510 Vitamin B12 deficiency anemia due to intrinsic factor deficiency: Secondary | ICD-10-CM | POA: Diagnosis not present

## 2015-06-25 DIAGNOSIS — I119 Hypertensive heart disease without heart failure: Secondary | ICD-10-CM | POA: Diagnosis not present

## 2015-06-25 DIAGNOSIS — I110 Hypertensive heart disease with heart failure: Secondary | ICD-10-CM | POA: Diagnosis not present

## 2015-06-25 DIAGNOSIS — R5383 Other fatigue: Secondary | ICD-10-CM | POA: Diagnosis not present

## 2015-06-25 DIAGNOSIS — E784 Other hyperlipidemia: Secondary | ICD-10-CM | POA: Diagnosis not present

## 2015-06-25 DIAGNOSIS — M2550 Pain in unspecified joint: Secondary | ICD-10-CM | POA: Diagnosis not present

## 2015-07-06 ENCOUNTER — Emergency Department (HOSPITAL_COMMUNITY)
Admission: EM | Admit: 2015-07-06 | Discharge: 2015-07-06 | Disposition: A | Discharge status: Home / Self Care | Payer: Medicare Other | Attending: Emergency Medicine | Admitting: Emergency Medicine

## 2015-07-06 ENCOUNTER — Encounter (HOSPITAL_COMMUNITY): Payer: Self-pay | Admitting: Emergency Medicine

## 2015-07-06 DIAGNOSIS — Z79899 Other long term (current) drug therapy: Secondary | ICD-10-CM | POA: Diagnosis not present

## 2015-07-06 DIAGNOSIS — J441 Chronic obstructive pulmonary disease with (acute) exacerbation: Secondary | ICD-10-CM | POA: Diagnosis not present

## 2015-07-06 DIAGNOSIS — F209 Schizophrenia, unspecified: Secondary | ICD-10-CM | POA: Insufficient documentation

## 2015-07-06 DIAGNOSIS — Z8669 Personal history of other diseases of the nervous system and sense organs: Secondary | ICD-10-CM | POA: Diagnosis not present

## 2015-07-06 DIAGNOSIS — Z7982 Long term (current) use of aspirin: Secondary | ICD-10-CM | POA: Insufficient documentation

## 2015-07-06 DIAGNOSIS — T43595A Adverse effect of other antipsychotics and neuroleptics, initial encounter: Secondary | ICD-10-CM | POA: Insufficient documentation

## 2015-07-06 DIAGNOSIS — Z792 Long term (current) use of antibiotics: Secondary | ICD-10-CM | POA: Insufficient documentation

## 2015-07-06 DIAGNOSIS — L270 Generalized skin eruption due to drugs and medicaments taken internally: Secondary | ICD-10-CM | POA: Diagnosis not present

## 2015-07-06 DIAGNOSIS — Z880 Allergy status to penicillin: Secondary | ICD-10-CM | POA: Diagnosis not present

## 2015-07-06 DIAGNOSIS — F319 Bipolar disorder, unspecified: Secondary | ICD-10-CM | POA: Diagnosis not present

## 2015-07-06 DIAGNOSIS — E079 Disorder of thyroid, unspecified: Secondary | ICD-10-CM | POA: Insufficient documentation

## 2015-07-06 DIAGNOSIS — R21 Rash and other nonspecific skin eruption: Secondary | ICD-10-CM | POA: Diagnosis not present

## 2015-07-06 DIAGNOSIS — Z87891 Personal history of nicotine dependence: Secondary | ICD-10-CM | POA: Insufficient documentation

## 2015-07-06 DIAGNOSIS — Z7951 Long term (current) use of inhaled steroids: Secondary | ICD-10-CM | POA: Diagnosis not present

## 2015-07-06 MED ORDER — PREDNISONE 20 MG PO TABS: 40.0000 mg | ORAL_TABLET | Freq: Every day | ORAL | Status: DC

## 2015-07-06 MED ORDER — PREDNISONE 10 MG PO TABS: 40.0000 mg | ORAL_TABLET | Freq: Every day | ORAL | Status: DC

## 2015-07-06 NOTE — ED Notes (Signed)
Pt states that her Tegretol was increased on Thursday and Seroquel was started and now has a rash on her face.  States that she woke up with it this morning.

## 2015-07-06 NOTE — ED Provider Notes (Signed)
CSN: [id number]     Arrival date & time 07/06/15  1322 History   First MD Initiated Contact with Patient 07/06/15 1335     Chief Complaint  Patient presents with  . Allergic Reaction     (Consider location/radiation/quality/duration/timing/severity/associated sxs/prior Treatment) Patient is a 59 y.o. female presenting with allergic reaction. The history is provided by the patient.  Allergic Reaction Presenting symptoms: rash    patient presents with a facial rash. Woke up with this morning. 2 days ago her Tegretol was increased and she was added Seroquel. States her throat does feel little tight. No history of rashes. She has not been on Tegretol for a while but the dose was just increased. It is only on her face. No fevers. No other new medications. No history of lupus.  Past Medical History  Diagnosis Date  . Asthma   . Bipolar 1 disorder (HCC)   . Schizophrenia (HCC)   . Neuropathy (HCC)   . Bronchitis   . Thyroid disease   . COPD (chronic obstructive pulmonary disease) [facility](HCC)    Past Surgical History  Procedure Laterality Date  . Abdominal hysterectomy    . Tonsillectomy    . Knee surgery    . Elbow surgery    . Foot surgery    . Cesarean section    . Broke left arm    . Colonoscopy N/A 08/30/2014    Procedure: COLONOSCOPY;  Surgeon: Malissa HippoNajeeb U Rehman, MD;  Location: [facility];  Service: Endoscopy;  Laterality: N/A;  1200   Family History  Problem Relation Age of Onset  . Dementia Mother   . Dementia Father   . Other Son     MVA accident   Social History  Substance Use Topics  . Smoking status: Former Smoker    Types: Cigarettes  . Smokeless tobacco: Never Used  . Alcohol Use: No   OB History    Gravida Para Term Preterm AB TAB SAB Ectopic Multiple Living   1 1        1      Review of Systems  Constitutional: Negative for activity change.  Respiratory: Positive for shortness of breath.   Cardiovascular: Negative for chest pain.  Gastrointestinal:  Negative for abdominal pain.  Genitourinary: Negative for flank pain.  Musculoskeletal: Negative for back pain.  Skin: Positive for rash.  Psychiatric/Behavioral: The patient is nervous/anxious.       Allergies  Aminophylline; Sumatriptan; Theophylline; Codeine; Depakote; Divalproex sodium; Doxycycline; Lamictal; Magnesium-containing compounds; Naproxen; Other; Penicillins; Pentazocine lactate; Risperidone; Tetracyclines & related; Vistaril; Tetracycline; and Zyprexa  Home Medications   Prior to Admission medications   Medication Sig Start Date End Date Taking? Authorizing Provider  Albuterol Sulfate (PROVENTIL HFA IN) Inhale 1-2 puffs into the lungs as needed (shorntess of breath).    Yes Historical Provider, MD  ALPRAZolam Prudy Feeler(XANAX) 1 MG tablet Take 1 mg by mouth 3 (three) times daily as needed for sleep or anxiety.    Yes Historical Provider, MD  aspirin 325 MG tablet Take 325 mg by mouth daily.   Yes Historical Provider, MD  budesonide-formoterol (SYMBICORT) 160-4.5 MCG/ACT inhaler Inhale 2 puffs into the lungs 2 (two) times daily. 03/25/14  Yes Lorre NickAnthony Allen, MD  levothyroxine (SYNTHROID, LEVOTHROID) 50 MCG tablet Take 50 mcg by mouth daily.   Yes Historical Provider, MD  loratadine (ALLERGY RELIEF) 10 MG tablet Take 1 mg by mouth daily.   Yes Historical Provider, MD  omeprazole (PRILOSEC) 20 MG capsule Take 1 capsule by  mouth daily. 10/18/14  Yes Historical Provider, MD  sodium chloride (OCEAN) 0.65 % SOLN nasal spray Place 1 spray into both nostrils as needed for congestion.   Yes Historical Provider, MD  ziprasidone (GEODON) 60 MG capsule Take 1 capsule by mouth 2 (two) times daily. 10/08/14  Yes Historical Provider, MD  cephALEXin (KEFLEX) 500 MG capsule Take 1 capsule (500 mg total) by mouth 4 (four) times daily. Patient not taking: Reported on 07/06/2015 05/18/15   Ivery Quale, PA-C  clindamycin (CLEOCIN) 150 MG capsule Take 3 capsules (450 mg total) by mouth 3 (three) times  daily. Patient not taking: Reported on 07/06/2015 03/10/15   Burgess Amor, PA-C  HYDROcodone-acetaminophen (NORCO/VICODIN) 5-325 MG per tablet Take 1 tablet by mouth every 4 (four) hours as needed. Patient not taking: Reported on 07/06/2015 03/10/15   Burgess Amor, PA-C  metroNIDAZOLE (FLAGYL) 500 MG tablet Take 1 tablet (500 mg total) by mouth 3 (three) times daily. Patient not taking: Reported on 07/06/2015 04/12/15   Len Blalock, NP  oxyCODONE-acetaminophen (PERCOCET/ROXICET) 5-325 MG tablet Take 1 tablet by mouth every 6 (six) hours as needed. Patient not taking: Reported on 07/06/2015 04/05/15   Tammy Triplett, PA-C  predniSONE (DELTASONE) 20 MG tablet Take 2 tablets (40 mg total) by mouth daily. 07/06/15   Benjiman Core, MD  vancomycin (VANCOCIN) 125 MG capsule Take 1 capsule (125 mg total) by mouth 4 (four) times daily. Patient not taking: Reported on 07/06/2015 04/16/15   Len Blalock, NP   BP 129/86 mmHg  Pulse 69  Temp(Src) 98.4 F (36.9 C) (Oral)  Resp 20  Ht  (1.727 m)  Wt 161 lb (73.029 kg)  BMI 24.49 kg/m2  SpO2 96% Physical Exam  Constitutional: She appears well-developed.  HENT:  Rash around face and eyes. No mucous membrane involvement.  Eyes: EOM are normal.  Neck: Neck supple.  Cardiovascular: Normal rate and regular rhythm.   Pulmonary/Chest: Effort normal.  Abdominal: Soft. There is no tenderness.  Musculoskeletal: Normal range of motion.  Neurological: She is alert.  Skin: Skin is warm.      ED Course  Procedures (including critical care time) Labs Review Labs Reviewed - No data to display  Imaging Review No results found. I have personally reviewed and evaluated these images and lab results as part of my medical decision-making.   EKG Interpretation None      MDM   Final diagnoses:  Drug rash    Patient with facial rash. Patient later states that she has been on oral cigarettes for her psoriasis. Appears somewhat like a lupus rash,  however being on new medications will stop those medications. No history of lupus. No mucous membrane involvement. Will discharge home to follow-up as needed.    Benjiman Core, MD 07/06/15 [phone]

## 2015-07-06 NOTE — Discharge Instructions (Signed)
Drug Rash °A drug rash is a change in the color or texture of the skin that is caused by a drug. It can develop minutes, hours, or days after the person takes the drug. °CAUSES °This condition is usually caused by a drug allergy. It can also be caused by exposure to sunlight after taking a drug that makes the skin sensitive to light. Drugs that commonly cause rashes include: °· Penicillin. °· Antibiotic medicines. °· Medicines that treat seizures. °· Medicines that treat cancer (chemotherapy). °· Aspirin and other nonsteroidal anti-inflammatory drugs (NSAIDs). °· Injectable dyes that contain iodine. °· Insulin. °SYMPTOMS °Symptoms of this condition include: °· Redness. °· Tiny bumps. °· Peeling. °· Itching. °· Itchy welts (hives). °· Swelling. °The rash may appear on a small area of skin or all over the body. °DIAGNOSIS °To diagnose the condition, your health care provider will do a physical exam. He or she may also order tests to find out which drug caused the rash. Tests to find the cause of a rash include: °· Skin tests. °· Blood tests. °· Drug challenge. For this test, you stop taking all of the drugs that you do not need to take, and then you start taking them again by adding back one of the drugs at a time. °TREATMENT °A drug rash may be treated with medicines, including: °· Antihistamines. These may be given to relieve itching. °· An NSAID. This may be given to reduce swelling and treat pain. °· A steroid drug. This may be given to reduce swelling. °The rash usually goes away when the person stops taking the drug that caused it. °HOME CARE INSTRUCTIONS °· Take medicines only as directed by your health care provider. °· Let all of your health care providers know about any drug reactions you have had in the past. °· If you have hives, take a cool shower or use a cool compress to relieve itchiness. °SEEK MEDICAL CARE IF: °· You have a fever. °· Your rash is not going away. °· Your rash gets worse. °· Your rash  comes back. °· You have wheezing or coughing. °SEEK IMMEDIATE MEDICAL CARE IF: °· You start to have breathing problems. °· You start to have shortness of breath. °· You face or throat starts to swell. °· You have severe weakness with dizziness or fainting. °· You have chest pain. °  °This information is not intended to replace advice given to you by your health care provider. Make sure you discuss any questions you have with your health care provider. °  °Document Released: 07/30/2004 Document Revised: 07/13/2014 Document Reviewed: 04/18/2014 °Elsevier Interactive Patient Education ©2016 [employer] ° °

## 2015-07-07 ENCOUNTER — Other Ambulatory Visit (INDEPENDENT_AMBULATORY_CARE_PROVIDER_SITE_OTHER): Payer: Self-pay | Admitting: Internal Medicine

## 2015-08-03 ENCOUNTER — Encounter (HOSPITAL_COMMUNITY): Payer: Self-pay | Admitting: Emergency Medicine

## 2015-08-03 ENCOUNTER — Inpatient Hospital Stay (HOSPITAL_COMMUNITY)
Admission: EM | Admit: 2015-08-03 | Discharge: 2015-08-06 | DRG: 872 | Disposition: A | Discharge status: Home / Self Care | Payer: Medicare Other | Attending: Family Medicine | Admitting: Family Medicine

## 2015-08-03 ENCOUNTER — Emergency Department (HOSPITAL_COMMUNITY): Payer: Medicare Other

## 2015-08-03 DIAGNOSIS — K5732 Diverticulitis of large intestine without perforation or abscess without bleeding: Secondary | ICD-10-CM | POA: Diagnosis present

## 2015-08-03 DIAGNOSIS — Z7982 Long term (current) use of aspirin: Secondary | ICD-10-CM | POA: Diagnosis not present

## 2015-08-03 DIAGNOSIS — E876 Hypokalemia: Secondary | ICD-10-CM | POA: Diagnosis present

## 2015-08-03 DIAGNOSIS — Z87891 Personal history of nicotine dependence: Secondary | ICD-10-CM | POA: Diagnosis not present

## 2015-08-03 DIAGNOSIS — J45909 Unspecified asthma, uncomplicated: Secondary | ICD-10-CM | POA: Diagnosis present

## 2015-08-03 DIAGNOSIS — Z23 Encounter for immunization: Secondary | ICD-10-CM

## 2015-08-03 DIAGNOSIS — J449 Chronic obstructive pulmonary disease, unspecified: Secondary | ICD-10-CM | POA: Diagnosis present

## 2015-08-03 DIAGNOSIS — D649 Anemia, unspecified: Secondary | ICD-10-CM | POA: Diagnosis present

## 2015-08-03 DIAGNOSIS — Z7951 Long term (current) use of inhaled steroids: Secondary | ICD-10-CM | POA: Diagnosis not present

## 2015-08-03 DIAGNOSIS — R1032 Left lower quadrant pain: Secondary | ICD-10-CM | POA: Diagnosis not present

## 2015-08-03 DIAGNOSIS — K5792 Diverticulitis of intestine, part unspecified, without perforation or abscess without bleeding: Secondary | ICD-10-CM | POA: Diagnosis present

## 2015-08-03 DIAGNOSIS — E039 Hypothyroidism, unspecified: Secondary | ICD-10-CM | POA: Diagnosis present

## 2015-08-03 DIAGNOSIS — F3170 Bipolar disorder, currently in remission, most recent episode unspecified: Secondary | ICD-10-CM | POA: Insufficient documentation

## 2015-08-03 DIAGNOSIS — F319 Bipolar disorder, unspecified: Secondary | ICD-10-CM | POA: Diagnosis present

## 2015-08-03 DIAGNOSIS — J4489 Other specified chronic obstructive pulmonary disease: Secondary | ICD-10-CM | POA: Insufficient documentation

## 2015-08-03 DIAGNOSIS — A419 Sepsis, unspecified organism: Secondary | ICD-10-CM | POA: Diagnosis present

## 2015-08-03 HISTORY — DX: Anemia, unspecified: D64.9

## 2015-08-03 HISTORY — DX: Irritable bowel syndrome, unspecified: K58.9

## 2015-08-03 HISTORY — DX: Diverticulitis of large intestine without perforation or abscess without bleeding: K57.32

## 2015-08-03 LAB — COMPREHENSIVE METABOLIC PANEL
ALBUMIN: 3.1 g/dL — AB (ref 3.5–5.0)
ALT: 22 U/L (ref 14–54)
AST: 16 U/L (ref 15–41)
Alkaline Phosphatase: 82 U/L (ref 38–126)
Anion gap: 11 (ref 5–15)
BILIRUBIN TOTAL: 0.7 mg/dL (ref 0.3–1.2)
BUN: 11 mg/dL (ref 6–20)
CO2: 25 mmol/L (ref 22–32)
CREATININE: 0.86 mg/dL (ref 0.44–1.00)
Calcium: 8.9 mg/dL (ref 8.9–10.3)
Chloride: 103 mmol/L (ref 101–111)
GFR calc Af Amer: >60 mL/min (ref >60)
GLUCOSE: 135 mg/dL — AB (ref 65–99)
Potassium: 3.3 mmol/L — ABNORMAL LOW (ref 3.5–5.1)
Sodium: 139 mmol/L (ref 135–145)
TOTAL PROTEIN: 6.5 g/dL (ref 6.5–8.1)

## 2015-08-03 LAB — URINE MICROSCOPIC-ADD ON

## 2015-08-03 LAB — CBC WITH DIFFERENTIAL/PLATELET
BASOS ABS: 0 10*3/uL (ref 0.0–0.1)
Basophils Relative: 0 %
Eosinophils Absolute: 0 10*3/uL (ref 0.0–0.7)
Eosinophils Relative: 0 %
HEMATOCRIT: 34 % — AB (ref 36.0–46.0)
Hemoglobin: 10.4 g/dL — ABNORMAL LOW (ref 12.0–15.0)
LYMPHS ABS: 1.5 10*3/uL (ref 0.7–4.0)
LYMPHS PCT: 8 %
MCH: 27.5 pg (ref 26.0–34.0)
MCHC: 30.6 g/dL (ref 30.0–36.0)
MCV: 89.9 fL (ref 78.0–100.0)
MONO ABS: 1.9 10*3/uL — AB (ref 0.1–1.0)
Monocytes Relative: 10 %
Neutro Abs: 15 10*3/uL — ABNORMAL HIGH (ref 1.7–7.7)
Neutrophils Relative %: 82 %
Platelets: 202 10*3/uL (ref 150–400)
RBC: 3.78 MIL/uL — AB (ref 3.87–5.11)
RDW: 15 % (ref 11.5–15.5)
WBC: 18.4 10*3/uL — AB (ref 4.0–10.5)

## 2015-08-03 LAB — URINALYSIS, ROUTINE W REFLEX MICROSCOPIC
Bilirubin Urine: NEGATIVE
Glucose, UA: NEGATIVE mg/dL
Ketones, ur: NEGATIVE mg/dL
NITRITE: NEGATIVE
PH: 7 (ref 5.0–8.0)
Protein, ur: NEGATIVE mg/dL
SPECIFIC GRAVITY, URINE: 1.01 (ref 1.005–1.030)

## 2015-08-03 LAB — MAGNESIUM: Magnesium: 1.9 mg/dL (ref 1.7–2.4)

## 2015-08-03 LAB — LACTIC ACID, PLASMA
LACTIC ACID, VENOUS: 2.7 mmol/L — AB (ref 0.5–2.0)
LACTIC ACID, VENOUS: 1.8 mmol/L (ref 0.5–2.0)

## 2015-08-03 LAB — PROTIME-INR
INR: 1.06 (ref 0.00–1.49)
PROTHROMBIN TIME: 14 s (ref 11.6–15.2)

## 2015-08-03 LAB — I-STAT CG4 LACTIC ACID, ED
LACTIC ACID, VENOUS: 2.56 mmol/L — AB (ref 0.5–2.0)
Lactic Acid, Venous: 0.55 mmol/L (ref 0.5–2.0)

## 2015-08-03 LAB — APTT: aPTT: 32 seconds (ref 24–37)

## 2015-08-03 LAB — PROCALCITONIN: Procalcitonin: 12.06 ng/mL

## 2015-08-03 LAB — LIPASE, BLOOD: Lipase: 22 U/L (ref 11–51)

## 2015-08-03 MED ORDER — METRONIDAZOLE IN NACL 5-0.79 MG/ML-% IV SOLN
500.0000 mg | Freq: Once | INTRAVENOUS | Status: AC
  Administered 2015-08-03: 500 mg via INTRAVENOUS
  Filled 2015-08-03: qty 100

## 2015-08-03 MED ORDER — SODIUM CHLORIDE 0.9 % IV BOLUS (SEPSIS)
1000.0000 mL | Freq: Once | INTRAVENOUS | Status: AC
  Administered 2015-08-03: 1000 mL via INTRAVENOUS

## 2015-08-03 MED ORDER — ENOXAPARIN SODIUM 40 MG/0.4ML ~~LOC~~ SOLN
40.0000 mg | SUBCUTANEOUS | Status: DC
Start: 2015-08-03 — End: 2015-08-06
  Administered 2015-08-03 – 2015-08-06 (×4): 40 mg via SUBCUTANEOUS
  Filled 2015-08-03 (×4): qty 0.4

## 2015-08-03 MED ORDER — VITAMIN D3 125 MCG (5000 UT) PO CAPS
1.0000 | ORAL_CAPSULE | Freq: Every day | ORAL | Status: DC
  Filled 2015-08-03 (×2): qty 1

## 2015-08-03 MED ORDER — ONDANSETRON HCL 4 MG/2ML IJ SOLN: 4.0000 mg | Freq: Four times a day (QID) | INTRAMUSCULAR | Status: DC | PRN

## 2015-08-03 MED ORDER — ZIPRASIDONE HCL 60 MG PO CAPS
60.0000 mg | ORAL_CAPSULE | Freq: Two times a day (BID) | ORAL | Status: DC
  Administered 2015-08-04 – 2015-08-06 (×5): 60 mg via ORAL
  Filled 2015-08-03 (×8): qty 1

## 2015-08-03 MED ORDER — VITAMIN C 500 MG PO TABS
500.0000 mg | ORAL_TABLET | Freq: Every day | ORAL | Status: DC
  Administered 2015-08-04 – 2015-08-06 (×3): 500 mg via ORAL
  Filled 2015-08-03 (×5): qty 1

## 2015-08-03 MED ORDER — FENTANYL CITRATE (PF) 100 MCG/2ML IJ SOLN
25.0000 ug | INTRAMUSCULAR | Status: DC | PRN
  Administered 2015-08-03 – 2015-08-06 (×9): 25 ug via INTRAVENOUS
  Filled 2015-08-03 (×9): qty 2

## 2015-08-03 MED ORDER — ALPRAZOLAM 1 MG PO TABS
1.0000 mg | ORAL_TABLET | Freq: Three times a day (TID) | ORAL | Status: DC | PRN
  Administered 2015-08-03 – 2015-08-04 (×2): 1 mg via ORAL
  Filled 2015-08-03 (×4): qty 1

## 2015-08-03 MED ORDER — BUDESONIDE-FORMOTEROL FUMARATE 160-4.5 MCG/ACT IN AERO
INHALATION_SPRAY | RESPIRATORY_TRACT | Status: AC
  Filled 2015-08-03: qty 6

## 2015-08-03 MED ORDER — ONDANSETRON HCL 4 MG/2ML IJ SOLN
4.0000 mg | Freq: Once | INTRAMUSCULAR | Status: AC
  Administered 2015-08-03: 4 mg via INTRAVENOUS
  Filled 2015-08-03: qty 2

## 2015-08-03 MED ORDER — LORATADINE 10 MG PO TABS
10.0000 mg | ORAL_TABLET | Freq: Every day | ORAL | Status: DC
  Administered 2015-08-04 – 2015-08-06 (×3): 10 mg via ORAL
  Filled 2015-08-03 (×4): qty 1

## 2015-08-03 MED ORDER — METRONIDAZOLE IN NACL 5-0.79 MG/ML-% IV SOLN
500.0000 mg | Freq: Three times a day (TID) | INTRAVENOUS | Status: DC
Start: 2015-08-03 — End: 2015-08-06
  Administered 2015-08-03 – 2015-08-06 (×8): 500 mg via INTRAVENOUS
  Filled 2015-08-03 (×8): qty 100

## 2015-08-03 MED ORDER — LEVOTHYROXINE SODIUM 50 MCG PO TABS
50.0000 ug | ORAL_TABLET | Freq: Every day | ORAL | Status: DC
  Administered 2015-08-04 – 2015-08-06 (×3): 50 ug via ORAL
  Filled 2015-08-03 (×4): qty 1

## 2015-08-03 MED ORDER — ALBUTEROL SULFATE HFA 108 (90 BASE) MCG/ACT IN AERS
2.0000 | INHALATION_SPRAY | RESPIRATORY_TRACT | Status: DC | PRN
  Filled 2015-08-03: qty 6.7

## 2015-08-03 MED ORDER — CIPROFLOXACIN IN D5W 400 MG/200ML IV SOLN
400.0000 mg | Freq: Two times a day (BID) | INTRAVENOUS | Status: DC
  Administered 2015-08-04 – 2015-08-06 (×5): 400 mg via INTRAVENOUS
  Filled 2015-08-03 (×5): qty 200

## 2015-08-03 MED ORDER — CIPROFLOXACIN IN D5W 400 MG/200ML IV SOLN
400.0000 mg | Freq: Once | INTRAVENOUS | Status: AC
  Administered 2015-08-03: 400 mg via INTRAVENOUS
  Filled 2015-08-03: qty 200

## 2015-08-03 MED ORDER — ZIPRASIDONE HCL 60 MG PO CAPS
ORAL_CAPSULE | ORAL | Status: AC
  Filled 2015-08-03: qty 1

## 2015-08-03 MED ORDER — BUDESONIDE-FORMOTEROL FUMARATE 160-4.5 MCG/ACT IN AERO
2.0000 | INHALATION_SPRAY | Freq: Two times a day (BID) | RESPIRATORY_TRACT | Status: DC
  Administered 2015-08-03 – 2015-08-06 (×6): 2 via RESPIRATORY_TRACT
  Filled 2015-08-03: qty 6

## 2015-08-03 MED ORDER — FENTANYL CITRATE (PF) 100 MCG/2ML IJ SOLN
50.0000 ug | Freq: Once | INTRAMUSCULAR | Status: AC
  Administered 2015-08-03: 50 ug via INTRAVENOUS
  Filled 2015-08-03: qty 2

## 2015-08-03 MED ORDER — POTASSIUM CHLORIDE IN NACL 40-0.9 MEQ/L-% IV SOLN
INTRAVENOUS | Status: AC
  Administered 2015-08-03: 100 mL/h via INTRAVENOUS
  Filled 2015-08-03 (×2): qty 1000

## 2015-08-03 MED ORDER — FENTANYL CITRATE (PF) 100 MCG/2ML IJ SOLN
25.0000 ug | Freq: Once | INTRAMUSCULAR | Status: AC
  Administered 2015-08-03: 25 ug via INTRAVENOUS
  Filled 2015-08-03: qty 2

## 2015-08-03 MED ORDER — PANTOPRAZOLE SODIUM 40 MG PO TBEC
40.0000 mg | DELAYED_RELEASE_TABLET | Freq: Every day | ORAL | Status: DC
  Administered 2015-08-04 – 2015-08-06 (×3): 40 mg via ORAL
  Filled 2015-08-03 (×4): qty 1

## 2015-08-03 MED ORDER — IOHEXOL 300 MG/ML  SOLN
25.0000 mL | Freq: Once | INTRAMUSCULAR | Status: AC | PRN
  Administered 2015-08-03: 25 mL via ORAL

## 2015-08-03 MED ORDER — DIPHENHYDRAMINE HCL 50 MG/ML IJ SOLN: 12.5000 mg | Freq: Once | INTRAMUSCULAR | Status: DC

## 2015-08-03 MED ORDER — ONDANSETRON HCL 4 MG PO TABS: 4.0000 mg | ORAL_TABLET | Freq: Four times a day (QID) | ORAL | Status: DC | PRN

## 2015-08-03 MED ORDER — ACETAMINOPHEN 325 MG PO TABS
650.0000 mg | ORAL_TABLET | Freq: Four times a day (QID) | ORAL | Status: DC | PRN
  Administered 2015-08-04 – 2015-08-05 (×2): 650 mg via ORAL
  Filled 2015-08-03 (×4): qty 2

## 2015-08-03 MED ORDER — HYDROCODONE-ACETAMINOPHEN 5-325 MG PO TABS
1.0000 | ORAL_TABLET | ORAL | Status: DC | PRN
  Administered 2015-08-05: 2 via ORAL
  Administered 2015-08-06: 1 via ORAL
  Filled 2015-08-03 (×4): qty 2

## 2015-08-03 MED ORDER — SIMVASTATIN 20 MG PO TABS
40.0000 mg | ORAL_TABLET | Freq: Every day | ORAL | Status: DC
  Administered 2015-08-04 – 2015-08-06 (×3): 40 mg via ORAL
  Filled 2015-08-03 (×5): qty 2

## 2015-08-03 MED ORDER — ACETAMINOPHEN 650 MG RE SUPP: 650.0000 mg | Freq: Four times a day (QID) | RECTAL | Status: DC | PRN

## 2015-08-03 MED ORDER — IOHEXOL 300 MG/ML  SOLN
100.0000 mL | Freq: Once | INTRAMUSCULAR | Status: AC | PRN
  Administered 2015-08-03: 100 mL via INTRAVENOUS

## 2015-08-03 NOTE — ED Notes (Signed)
Report given to [facility] on Dept [address], all questions answered

## 2015-08-03 NOTE — Final Progress Note (Signed)
CRITICAL VALUE ALERT  Critical value received:  2.7 Lactic acid  Date of notification:  08/03/2015  Time of notification:  1730  Critical value read back:y  Nurse who received alert:  Zannie Kehr  MD notified (1st page):  Opyd  Time of first page:  1734  MD notified (2nd page):  Time of second page:  Responding MD:  opyd  Time MD responded:  1734  New orders given and followed.

## 2015-08-03 NOTE — H&P (Signed)
[facility] History and Physical  Kristen Ryan ZOX:[id number] DOB: Nov 01, 1955 DOA: 08/03/2015  Referring physician: ED physician PCP: Isabella Stalling, MD  Specialists: Dr. Terrace Arabia (neurololgy); [facility]  Chief Complaint:  Lower abdominal pain, fever   HPI: Kristen Ryan is a 60 y.o. female with PMH of COPD not on home O2, bipolar disorder, chronic anemia, and IBS who presents to the ED with 2 days of lower abdominal pain and fevers. Patient reports pain in her usual state of health until approximately 2 days ago when she developed pain in the lower abdomen that is described as severe, constant, crampy, localized to the lower quadrants, associated with fever, and with no exacerbating or alleviating factors identified by the patient. She has history of IBS but has never had prior experience with symptoms similar to this. She denies cough, dyspnea, chest pain, palpitations, dysuria, flank pain, or headaches. She denies any diarrhea or constipation reports having a normal stool earlier in the day of her presentation. There's been no sick contacts or recent long distance travel. She has history of remote cesarean section and abdominal hysterectomy. She had colonoscopy in February 2016 with report of scattered diverticuli and redundant colon.  In ED, patient was found to be afebrile and saturating well on room air with good blood pressure, but tachycardic to the 110s and in obvious discomfort. Initial blood work returned notable for a leukocytosis to 18,000 and serum lactate elevated to 2.56. Contrast-enhanced CT of the abdomen was obtained and suggestive of sigmoid diverticulitis without perforation or abscess. Patient was given a 1 L bolus of normal saline in the ED and treated empirically with IV ciprofloxacin and Flagyl. Pain was managed with IV fentanyl and she remained stable in the ED. She will be admitted for ongoing evaluation and management of uncomplicated  diverticulitis with elevated lactic acid.  Where does patient live?   At home    Can patient participate in ADLs?  Yes      Review of Systems:   General:  Fevers and chills.  No weight change, poor appetite, or fatigue HEENT: no blurry vision, hearing changes or sore throat Pulm: no dyspnea, cough, or wheeze CV: no chest pain or palpitations Abd: no nausea, vomiting, diarrhea, or constipation.  Lower abdominal pain.  GU: no dysuria, hematuria, increased urinary frequency, or urgency  Ext: no leg edema.  Bilateral leg cramps.  Neuro: no focal weakness, numbness, or tingling, no vision change or hearing loss Skin: no rash, no wounds MSK: No muscle spasm, no deformity, no red, hot, or swollen joint. Chronic upper back pain.  Heme: No easy bruising or bleeding Travel history: No recent long distant travel    Allergy:  Allergies  Allergen Reactions  . Aminophylline Anaphylaxis  . Sumatriptan Anaphylaxis and Other (See Comments)    Causes fainting  . Theophylline Anaphylaxis  . Codeine Itching and Other (See Comments)    Too strong for patient   . Depakote [Divalproex Sodium] Other (See Comments)    Hair loss  . Divalproex Sodium Other (See Comments)    Causes hair to fall out  . Doxycycline Other (See Comments)    headache  . Lamictal [Lamotrigine]     Destroys thyroid  . Magnesium-Containing Compounds Hives  . Naproxen Swelling  . Other     Hair dye  . Penicillins Nausea And Vomiting    Has patient had a PCN reaction causing immediate rash, facial/tongue/throat swelling, SOB or lightheadedness with hypotension: Yes Has  patient had a PCN reaction causing severe rash involving mucus membranes or skin necrosis: No Has patient had a PCN reaction that required hospitalization Yes Has patient had a PCN reaction occurring within the last 10 years: No If all of the above answers are "NO", then may proceed with Cephalosporin use.   Marland Kitchen Pentazocine Lactate Other (See Comments)     Unknown reaction  . Risperidone Other (See Comments)    Insomnia   . Seroquel [Quetiapine] Swelling  . Tegretol [Carbamazepine] Swelling  . Tetracyclines & Related   . Vistaril [Hydroxyzine Hcl] Nausea And Vomiting  . Tetracycline Rash    Headaches  . Zyprexa [Olanzapine] Rash and Other (See Comments)    Insomnia    Past Medical History  Diagnosis Date  . Asthma   . Bipolar 1 disorder (HCC)   . Schizophrenia (HCC)   . Neuropathy (HCC)   . Bronchitis   . Thyroid disease   . COPD (chronic obstructive pulmonary disease) (HCC)   . IBS (irritable bowel syndrome)   . Anemia     Past Surgical History  Procedure Laterality Date  . Abdominal hysterectomy    . Tonsillectomy    . Knee surgery    . Elbow surgery    . Foot surgery    . Cesarean section    . Broke left arm    . Colonoscopy N/A 08/30/2014    Procedure: COLONOSCOPY;  Surgeon: Malissa Hippo, MD;  Location: [facility];  Service: Endoscopy;  Laterality: N/A;  1200    Social History:  reports that she quit smoking about 7 years ago. Her smoking use included Cigarettes. She smoked 4.00 packs per day. She has never used smokeless tobacco. She reports that she does not drink alcohol or use illicit drugs.  Family History:  Family History  Problem Relation Age of Onset  . Dementia Mother   . Dementia Father   . Other Son     MVA accident     Prior to Admission medications   Medication Sig Start Date End Date Taking? Authorizing Provider  Albuterol Sulfate (PROVENTIL HFA IN) Inhale 1-2 puffs into the lungs as needed (shorntess of breath).    Yes Historical Provider, MD  ALPRAZolam Prudy Feeler) 1 MG tablet Take 1 mg by mouth 3 (three) times daily as needed for sleep or anxiety.    Yes Historical Provider, MD  aspirin 325 MG tablet Take 325 mg by mouth daily.   Yes Historical Provider, MD  budesonide-formoterol (SYMBICORT) 160-4.5 MCG/ACT inhaler Inhale 2 puffs into the lungs 2 (two) times daily. 03/25/14  Yes Lorre Nick,  MD  Cholecalciferol (VITAMIN D3) 5000 units CAPS Take 1 capsule by mouth daily.   Yes Historical Provider, MD  dicyclomine (BENTYL) 10 MG capsule TAKE 1 CAPSULE BY MOUTH THREE TIMES DAILY AS NEEDED FOR SPASMS. 07/09/15  Yes Len Blalock, NP  levothyroxine (SYNTHROID, LEVOTHROID) 50 MCG tablet Take 50 mcg by mouth daily.   Yes Historical Provider, MD  loratadine (ALLERGY RELIEF) 10 MG tablet Take 1 mg by mouth daily.   Yes Historical Provider, MD  Multiple Vitamins-Minerals (HAIR/SKIN/NAILS PO) Take 1 tablet by mouth daily.   Yes Historical Provider, MD  omeprazole (PRILOSEC) 20 MG capsule Take 1 capsule by mouth daily. 10/18/14  Yes Historical Provider, MD  simvastatin (ZOCOR) 40 MG tablet Take 1 tablet by mouth daily. 07/18/15  Yes Historical Provider, MD  sodium chloride (OCEAN) 0.65 % SOLN nasal spray Place 1 spray into both nostrils as  needed for congestion.   Yes Historical Provider, MD  vitamin C (ASCORBIC ACID) 500 MG tablet Take 500 mg by mouth daily.   Yes Historical Provider, MD  ziprasidone (GEODON) 60 MG capsule Take 1 capsule by mouth 2 (two) times daily. 10/08/14  Yes Historical Provider, MD    Physical Exam: Filed Vitals:   08/03/15 1216 08/03/15 1230 08/03/15 1520 08/03/15 1521  BP: 137/71 133/62 158/119 121/88  Pulse: 94 90 87 84  Temp: 98.2 F (36.8 C)   98.2 F (36.8 C)  TempSrc:    Oral  Resp: 16   18  Height:      Weight:      SpO2: 100% 96% 100% 100%   General: Not in acute distress HEENT:       Eyes: PERRL, EOMI, no scleral icterus or conjunctival pallor.       ENT: No discharge from the ears or nose, no pharyngeal ulcers, petechiae or exudate, no tonsillar enlargement.        Neck: No JVD, no bruit, no appreciable mass Heme: No cervical adenopathy, no pallor Cardiac: S1/S2, RRR, No murmurs, No gallops or rubs. Pulm: Good air movement bilaterally. No rales, wheezing, rhonchi or rubs. Abd: Soft, nondistended, tender across both lower quadrants, no rebound pain or  gaurding, no mass or organomegaly, BS present. Ext: No LE edema bilaterally. 2+DP/PT pulse bilaterally. Musculoskeletal: No gross deformity, no red, hot, swollen joints, no limitation in ROM  Skin: No rashes or wounds on exposed surfaces  Neuro: Alert, oriented X3, cranial nerves II-XII grossly intact. No focal findings Psych: Patient is not overtly psychotic, appropriate mood and affect.  Labs on Admission:  Basic Metabolic Panel:  Recent Labs Lab 08/03/15 1136  NA 139  K 3.3*  CL 103  CO2 25  GLUCOSE 135*  BUN 11  CREATININE 0.86  CALCIUM 8.9   Liver Function Tests:  Recent Labs Lab 08/03/15 1136  AST 16  ALT 22  ALKPHOS 82  BILITOT 0.7  PROT 6.5  ALBUMIN 3.1*    Recent Labs Lab 08/03/15 1136  LIPASE 22   No results for input(s): AMMONIA in the last 168 hours. CBC:  Recent Labs Lab 08/03/15 1136  WBC 18.4*  NEUTROABS 15.0*  HGB 10.4*  HCT 34.0*  MCV 89.9  PLT 202   Cardiac Enzymes: No results for input(s): CKTOTAL, CKMB, CKMBINDEX, TROPONINI in the last 168 hours.  BNP (last 3 results) No results for input(s): BNP in the last 8760 hours.  ProBNP (last 3 results) No results for input(s): PROBNP in the last 8760 hours.  CBG: No results for input(s): GLUCAP in the last 168 hours.  Radiological Exams on Admission: Ct Abdomen Pelvis W Contrast  08/03/2015  CLINICAL DATA:  Lower abdominal pain and fevers EXAM: CT ABDOMEN AND PELVIS WITH CONTRAST TECHNIQUE: Multidetector CT imaging of the abdomen and pelvis was performed using the standard protocol following bolus administration of intravenous contrast. CONTRAST:  25mL OMNIPAQUE IOHEXOL 300 MG/ML SOLN, OMNIPAQUE IOHEXOL 300 MG/ML SOLN COMPARISON:  None. FINDINGS: Lung bases are free of acute infiltrate or sizable effusion. The liver, gallbladder, spleen, adrenal glands and pancreas are within normal limits. Kidneys are well visualized bilaterally and demonstrate a normal enhancement pattern. A 1 cm  cyst is noted in the upper pole of the left kidney. No calculi or obstructive changes are noted. The bladder is well distended. The appendix is barium filled and within normal limits. Diverticular change of the colon is noted particularly in  the sigmoid colon with areas of inflammatory change consistent with diverticulitis. There is a focally enlarged tick best seen on images 64-70 of series 2 with inflammatory changes. No definitive perforation or abscess formation is seen. No free pelvic fluid is noted. No bony abnormality is seen. The uterus has been surgically removed. No acute bony abnormality is seen. IMPRESSION: Changes consistent with sigmoid diverticulitis without evidence of perforation or abscess formation. Electronically Signed   By: Alcide Clever M.D.   On: 08/03/2015 13:29    EKG:  Not done in ED, will obtain as appropriate   Assessment/Plan  1. Sepsis d/t acute sigmoid diverticulitis  - Febrile at home, tachycardic here with leukocytosis, lactate 2.56 -> 2.7 - CT c/w sigmoid diverticulitis without sign of perforation or abscess  - Admitted with elevated lactate, concern for worsening sepsis  - Empiric IV Cipro and Flagyl, convert to PO at discharge  - Clear liquids, advance as pt improves  - Had 1 L NS bolus in ED; repeating bolus and continuing with IVF at 100 cc/hr overnight  - Trend lactate, serial abd exams   2. Hypokalemia  - Serum K+ 3.3 on arrival  - Replacing in IVF  - Check magnesium level  - Repeat chem panel in am   3. Normocytic anemia  - Hgb 10.4 on admission, down from apparent baseline of 11.5-12  - Uncertain etio for slight drop from baseline, possibly dilutional after fluid bolus - No sign of active blood loss  - Repeat CBC in am   4. COPD  - Appears stable, no suggestion of exacerbation  - Continue home Symbicort - Albuterol MDI prn SOB or wheezing  - Supplemental O2 prn sat <92%    5. Bipolar disorder  - Appears stable  - Continue Geodon, monitor     6. Hypothyroidism  - Continue home-dose Synthroid  - Appears euthymic      DVT ppx:  SQ Lovenox     Code Status: Full code Family Communication: None at bed side.          Disposition Plan: Admit to inpatient   Date of Service 08/03/2015    Briscoe Deutscher, MD [facility] Pager [phone]  If 7PM-7AM, please contact night-coverage [email or web address] Password [facility] 08/03/2015, 3:45 PM

## 2015-08-03 NOTE — ED Provider Notes (Signed)
CSN: [id number]     Arrival date & time 08/03/15  1033 History   First MD Initiated Contact with Patient 08/03/15 1043     Chief Complaint  Patient presents with  . Abdominal Pain     (Consider location/radiation/quality/duration/timing/severity/associated sxs/prior Treatment) HPI   Kristen Ryan is a 60 y.o. female who presents to the Emergency Department complaining of diffuse lower abdominal pain since last evening.  She describes the pain as constant and worsening in severity and she notes "feeling swollen" in her stomach.  She notes a fever intermittently at home with a Tmax of 103.  She reports having normal, regular stools and has not noticed any color or consistency changes.  Nothing makes her symptoms better or worse.  She states she has been told she had IBS, but she states this pain does not feel similar to that.  She denies nausea, vomiting, dysuria, back pain or chest pain.     Past Medical History  Diagnosis Date  . Asthma   . Bipolar 1 disorder (HCC)   . Schizophrenia (HCC)   . Neuropathy (HCC)   . Bronchitis   . Thyroid disease   . COPD (chronic obstructive pulmonary disease) (HCC)   . IBS (irritable bowel syndrome)   . Anemia    Past Surgical History  Procedure Laterality Date  . Abdominal hysterectomy    . Tonsillectomy    . Knee surgery    . Elbow surgery    . Foot surgery    . Cesarean section    . Broke left arm    . Colonoscopy N/A 08/30/2014    Procedure: COLONOSCOPY;  Surgeon: Malissa Hippo, MD;  Location: [facility];  Service: Endoscopy;  Laterality: N/A;  1200   Family History  Problem Relation Age of Onset  . Dementia Mother   . Dementia Father   . Other Son     MVA accident   Social History  Substance Use Topics  . Smoking status: Former Smoker -- 4.00 packs/day    Types: Cigarettes    Quit date: 07/06/2008  . Smokeless tobacco: Never Used  . Alcohol Use: No   OB History    Gravida Para Term Preterm AB TAB SAB Ectopic Multiple  Living   Review of Systems  Constitutional: Positive for fever and appetite change. Negative for chills.  Respiratory: Negative for shortness of breath.   Cardiovascular: Negative for chest pain.  Gastrointestinal: Positive for abdominal pain. Negative for nausea, vomiting, diarrhea, constipation and blood in stool.  Genitourinary: Negative for dysuria, flank pain, decreased urine volume and difficulty urinating.  Musculoskeletal: Negative for back pain.  Skin: Negative for color change and rash.  Neurological: Negative for dizziness, weakness and numbness.  Hematological: Negative for adenopathy.  All other systems reviewed and are negative.     Allergies  Aminophylline; Sumatriptan; Theophylline; Codeine; Depakote; Divalproex sodium; Doxycycline; Lamictal; Magnesium-containing compounds; Naproxen; Other; Penicillins; Pentazocine lactate; Risperidone; Seroquel; Tegretol; Tetracyclines & related; Vistaril; Tetracycline; and Zyprexa  Home Medications   Prior to Admission medications   Medication Sig Start Date End Date Taking? Authorizing Provider  Albuterol Sulfate (PROVENTIL HFA IN) Inhale 1-2 puffs into the lungs as needed (shorntess of breath).    Yes Historical Provider, MD  ALPRAZolam Prudy Feeler) 1 MG tablet Take 1 mg by mouth 3 (three) times daily as needed for sleep or anxiety.    Yes Historical Provider, MD  aspirin 325 MG tablet Take 325 mg by mouth daily.   Yes Historical Provider, MD  budesonide-formoterol (SYMBICORT) 160-4.5 MCG/ACT inhaler Inhale 2 puffs into the lungs 2 (two) times daily. 03/25/14  Yes Lorre Nick, MD  Cholecalciferol (VITAMIN D3) 5000 units CAPS Take 1 capsule by mouth daily.   Yes Historical Provider, MD  dicyclomine (BENTYL) 10 MG capsule TAKE 1 CAPSULE BY MOUTH THREE TIMES DAILY AS NEEDED FOR SPASMS. 07/09/15  Yes Len Blalock, NP  levothyroxine (SYNTHROID, LEVOTHROID) 50 MCG tablet Take 50 mcg by mouth daily.   Yes Historical Provider,  MD  loratadine (ALLERGY RELIEF) 10 MG tablet Take 1 mg by mouth daily.   Yes Historical Provider, MD  Multiple Vitamins-Minerals (HAIR/SKIN/NAILS PO) Take 1 tablet by mouth daily.   Yes Historical Provider, MD  omeprazole (PRILOSEC) 20 MG capsule Take 1 capsule by mouth daily. 10/18/14  Yes Historical Provider, MD  simvastatin (ZOCOR) 40 MG tablet Take 1 tablet by mouth daily. 07/18/15  Yes Historical Provider, MD  sodium chloride (OCEAN) 0.65 % SOLN nasal spray Place 1 spray into both nostrils as needed for congestion.   Yes Historical Provider, MD  vitamin C (ASCORBIC ACID) 500 MG tablet Take 500 mg by mouth daily.   Yes Historical Provider, MD  ziprasidone (GEODON) 60 MG capsule Take 1 capsule by mouth 2 (two) times daily. 10/08/14  Yes Historical Provider, MD  cephALEXin (KEFLEX) 500 MG capsule Take 1 capsule (500 mg total) by mouth 4 (four) times daily. Patient not taking: Reported on 07/06/2015 05/18/15   Ivery Quale, PA-C  clindamycin (CLEOCIN) 150 MG capsule Take 3 capsules (450 mg total) by mouth 3 (three) times daily. Patient not taking: Reported on 07/06/2015 03/10/15   Burgess Amor, PA-C  HYDROcodone-acetaminophen (NORCO/VICODIN) 5-325 MG per tablet Take 1 tablet by mouth every 4 (four) hours as needed. Patient not taking: Reported on 07/06/2015 03/10/15   Burgess Amor, PA-C  metroNIDAZOLE (FLAGYL) 500 MG tablet Take 1 tablet (500 mg total) by mouth 3 (three) times daily. Patient not taking: Reported on 08/03/2015 04/12/15   Len Blalock, NP  oxyCODONE-acetaminophen (PERCOCET/ROXICET) 5-325 MG tablet Take 1 tablet by mouth every 6 (six) hours as needed. Patient not taking: Reported on 07/06/2015 04/05/15   Boyd Litaker, PA-C  predniSONE (DELTASONE) 20 MG tablet Take 2 tablets (40 mg total) by mouth daily. Patient not taking: Reported on 08/03/2015 07/06/15   Benjiman Core, MD  vancomycin (VANCOCIN) 125 MG capsule Take 1 capsule (125 mg total) by mouth 4 (four) times daily. Patient not  taking: Reported on 07/06/2015 04/16/15   Len Blalock, NP   BP 137/71 mmHg  Pulse 94  Temp(Src) 98.2 F (36.8 C) (Oral)  Resp 16  Ht 5' 7.75" (1.721 m)  Wt 73.301 kg  BMI 24.75 kg/m2  SpO2 100% Physical Exam  Constitutional: She is oriented to person, place, and time. She appears well-developed and well-nourished. No distress.  HENT:  Head: Normocephalic and atraumatic.  Mouth/Throat: Oropharynx is clear and moist.  Cardiovascular: Normal rate, regular rhythm, normal heart sounds and intact distal pulses.   No murmur heard. Pulmonary/Chest: Effort normal and breath sounds normal. No respiratory distress.  Abdominal: Soft. Bowel sounds are normal. She exhibits no distension and no mass. There is tenderness. There is no rebound and no guarding.  Diffuse ttp of the lower abdomen.  No guarding or rebound tenderness.  No distention  Musculoskeletal: Normal range of motion. She exhibits no edema.  Neurological: She is alert  and oriented to person, place, and time. She exhibits normal muscle tone. Coordination normal.  Skin: Skin is warm and dry.  Nursing note and vitals reviewed.   ED Course  Procedures (including critical care time) Labs Review Labs Reviewed  COMPREHENSIVE METABOLIC PANEL - Abnormal; Notable for the following:    Potassium 3.3 (*)    Glucose, Bld 135 (*)    Albumin 3.1 (*)    All other components within normal limits  CBC WITH DIFFERENTIAL/PLATELET - Abnormal; Notable for the following:    WBC 18.4 (*)    RBC 3.78 (*)    Hemoglobin 10.4 (*)    HCT 34.0 (*)    Neutro Abs 15.0 (*)    Monocytes Absolute 1.9 (*)    All other components within normal limits  URINALYSIS, ROUTINE W REFLEX MICROSCOPIC (NOT AT [facility]) - Abnormal; Notable for the following:    Hgb urine dipstick TRACE (*)    Leukocytes, UA MODERATE (*)    All other components within normal limits  URINE MICROSCOPIC-ADD ON - Abnormal; Notable for the following:    Squamous Epithelial / LPF 6-30 (*)     Bacteria, UA FEW (*)    All other components within normal limits  I-STAT CG4 LACTIC ACID, ED - Abnormal; Notable for the following:    Lactic Acid, Venous 2.56 (*)    All other components within normal limits  LIPASE, BLOOD    Imaging Review Ct Abdomen Pelvis W Contrast  08/03/2015  CLINICAL DATA:  Lower abdominal pain and fevers EXAM: CT ABDOMEN AND PELVIS WITH CONTRAST TECHNIQUE: Multidetector CT imaging of the abdomen and pelvis was performed using the standard protocol following bolus administration of intravenous contrast. CONTRAST:  25mL OMNIPAQUE IOHEXOL 300 MG/ML SOLN, OMNIPAQUE IOHEXOL 300 MG/ML SOLN COMPARISON:  None. FINDINGS: Lung bases are free of acute infiltrate or sizable effusion. The liver, gallbladder, spleen, adrenal glands and pancreas are within normal limits. Kidneys are well visualized bilaterally and demonstrate a normal enhancement pattern. A 1 cm cyst is noted in the upper pole of the left kidney. No calculi or obstructive changes are noted. The bladder is well distended. The appendix is barium filled and within normal limits. Diverticular change of the colon is noted particularly in the sigmoid colon with areas of inflammatory change consistent with diverticulitis. There is a focally enlarged tick best seen on images 64-70 of series 2 with inflammatory changes. No definitive perforation or abscess formation is seen. No free pelvic fluid is noted. No bony abnormality is seen. The uterus has been surgically removed. No acute bony abnormality is seen. IMPRESSION: Changes consistent with sigmoid diverticulitis without evidence of perforation or abscess formation. Electronically Signed   By: Alcide Clever M.D.   On: 08/03/2015 13:29   I have personally reviewed and evaluated these images and lab results as part of my medical decision-making.   EKG Interpretation None      MDM   Final diagnoses:  Diverticulitis of intestine without perforation or abscess without  bleeding    Pt is non-toxic appearing.  Vitals stable.  Reports fever at home and diffuse lower abd pain.  Will obtain labs and CT scan.    1220  Pt is feeling better after IV fentanyl and zofran.  Waiting to go to CT  Results discussed with Dr. Radford Pax, pt remains hemodynamically stable.  IV cipro and flagyl started.  I will consult hospitalist for admission  1508  Consulted Dr. Antionette Char who agrees to admit.  Pauline Aus, PA-C 08/03/15 1652  Nelva Nay, MD 08/04/15 [phone]

## 2015-08-03 NOTE — ED Notes (Addendum)
Patient c/o lower abd pain that started last night and has progressively gotten worse. Denies any nausea, vomiting, diarrhea or urinary symptoms. Denies any vaginal bleeding. Per patient last BM this morning-NO blood noted. Patient states "feels like stomach is distended." Per patient fever of 103 x2 days.

## 2015-08-04 LAB — CBC WITH DIFFERENTIAL/PLATELET
BASOS ABS: 0 10*3/uL (ref 0.0–0.1)
BASOS PCT: 0 %
EOS ABS: 0.1 10*3/uL (ref 0.0–0.7)
Eosinophils Relative: 1 %
HEMATOCRIT: 28.7 % — AB (ref 36.0–46.0)
HEMOGLOBIN: 8.9 g/dL — AB (ref 12.0–15.0)
Lymphocytes Relative: 14 %
Lymphs Abs: 1.9 10*3/uL (ref 0.7–4.0)
MCH: 28.3 pg (ref 26.0–34.0)
MCHC: 31 g/dL (ref 30.0–36.0)
MCV: 91.1 fL (ref 78.0–100.0)
Monocytes Absolute: 1.8 10*3/uL — ABNORMAL HIGH (ref 0.1–1.0)
Monocytes Relative: 13 %
NEUTROS ABS: 9.6 10*3/uL — AB (ref 1.7–7.7)
NEUTROS PCT: 72 %
Platelets: 182 10*3/uL (ref 150–400)
RBC: 3.15 MIL/uL — AB (ref 3.87–5.11)
RDW: 15.3 % (ref 11.5–15.5)
WBC: 13.4 10*3/uL — AB (ref 4.0–10.5)

## 2015-08-04 LAB — COMPREHENSIVE METABOLIC PANEL
ALBUMIN: 2.6 g/dL — AB (ref 3.5–5.0)
ALK PHOS: 70 U/L (ref 38–126)
ALT: 18 U/L (ref 14–54)
ANION GAP: 8 (ref 5–15)
AST: 15 U/L (ref 15–41)
BUN: 7 mg/dL (ref 6–20)
CALCIUM: 8.4 mg/dL — AB (ref 8.9–10.3)
CO2: 25 mmol/L (ref 22–32)
Chloride: 108 mmol/L (ref 101–111)
Creatinine, Ser: 0.69 mg/dL (ref 0.44–1.00)
GFR calc Af Amer: >60 mL/min (ref >60)
GFR calc non Af Amer: >60 mL/min (ref >60)
GLUCOSE: 99 mg/dL (ref 65–99)
Potassium: 4.6 mmol/L (ref 3.5–5.1)
SODIUM: 141 mmol/L (ref 135–145)
Total Bilirubin: 0.7 mg/dL (ref 0.3–1.2)
Total Protein: 5.6 g/dL — ABNORMAL LOW (ref 6.5–8.1)

## 2015-08-04 LAB — GLUCOSE, CAPILLARY: Glucose-Capillary: 88 mg/dL (ref 65–99)

## 2015-08-04 LAB — URINE CULTURE

## 2015-08-04 MED ORDER — ALBUTEROL SULFATE (2.5 MG/3ML) 0.083% IN NEBU: 2.5000 mg | INHALATION_SOLUTION | RESPIRATORY_TRACT | Status: DC | PRN

## 2015-08-04 MED ORDER — VITAMIN D 1000 UNITS PO TABS
5000.0000 [IU] | ORAL_TABLET | Freq: Every day | ORAL | Status: DC
  Administered 2015-08-04 – 2015-08-06 (×3): 5000 [IU] via ORAL
  Filled 2015-08-04 (×3): qty 5

## 2015-08-04 NOTE — Progress Notes (Signed)
Patient is a sigmoid diverticulitis clinically pain denies 20% less than yesterday currently on Flagyl and Cipro IV nausea vomiting and no hematemesis, mean hematochezia lactic acid has normalized Kristen Ryan [id number] DOB: 1955/07/09 DOA: 08/03/2015 PCP: Isabella Stalling, MD             Physical Exam: Blood pressure 144/65, pulse 107, temperature 99.1 F (37.3 C), temperature source Oral, resp. rate 20, height  (1.702 Ryan), weight 161 lb 6 oz (73.2 kg), SpO2 91 %. lungs clear to A&P with prolonged respiratory phase no rales wheeze rhonchi appreciable heart regular rhythm no S3-S4 no heaves thrills rubs abdomen mild left lower quadrant tenderness positive guarding no rebound no masses no detectable organomegaly   Investigations:  No results found for this or any previous visit (from the past 240 hour(s)).   Basic Metabolic Panel:  Recent Labs  21/30/86 1136 08/04/15 0657  NA 139 141  K 3.3* 4.6  CL 103 108  CO2 25 25  GLUCOSE 135* 99  BUN 11 7  CREATININE 0.86 0.69  CALCIUM 8.9 8.4*  MG 1.9  --    Liver Function Tests:  Recent Labs  08/03/15 1136 08/04/15 0657  AST 16 15  ALT 22 18  ALKPHOS 82 70  BILITOT 0.7 0.7  PROT 6.5 5.6*  ALBUMIN 3.1* 2.6*     CBC:  Recent Labs  08/03/15 1136 08/04/15 0657  WBC 18.4* 13.4*  NEUTROABS 15.0* 9.6*  HGB 10.4* 8.9*  HCT 34.0* 28.7*  MCV 89.9 91.1  PLT 202 182    Ct Abdomen Pelvis W Contrast  08/03/2015  CLINICAL DATA:  Lower abdominal pain and fevers EXAM: CT ABDOMEN AND PELVIS WITH CONTRAST TECHNIQUE: Multidetector CT imaging of the abdomen and pelvis was performed using the standard protocol following bolus administration of intravenous contrast. CONTRAST:  25mL OMNIPAQUE IOHEXOL 300 MG/ML SOLN, OMNIPAQUE IOHEXOL 300 MG/ML SOLN COMPARISON:  None. FINDINGS: Lung bases are free of acute infiltrate or sizable effusion. The liver, gallbladder, spleen, adrenal glands and pancreas are within normal  limits. Kidneys are well visualized bilaterally and demonstrate a normal enhancement pattern. A 1 cm cyst is noted in the upper pole of the left kidney. No calculi or obstructive changes are noted. The bladder is well distended. The appendix is barium filled and within normal limits. Diverticular change of the colon is noted particularly in the sigmoid colon with areas of inflammatory change consistent with diverticulitis. There is a focally enlarged tick best seen on images 64-70 of series 2 with inflammatory changes. No definitive perforation or abscess formation is seen. No free pelvic fluid is noted. No bony abnormality is seen. The uterus has been surgically removed. No acute bony abnormality is seen. IMPRESSION: Changes consistent with sigmoid diverticulitis without evidence of perforation or abscess formation. Electronically Signed   By: Alcide Clever Ryan.D.   On: 08/03/2015 13:29      Medications:   Impression:  Principal Problem:   Sigmoid diverticulitis Active Problems:   Hypokalemia   Normocytic anemia   Acute diverticulitis   COPD with asthma (HCC)   Sepsis (HCC)   Thyroid activity decreased   Bipolar disorder in partial remission (HCC)     Plan: Continue IV Cipro and Flagyl continue opioid analgesia monitor leukocytosis and clinical response  Consultants:    Procedures   Antibiotics: Cipro and Flagyl IV                  Code Status: Full  Family  Communication:  Patient at length  Disposition Plan   Time spent: 30 minutes   LOS: 1 day   Kristen Ryan   08/04/2015, 12:19 PM

## 2015-08-05 LAB — GLUCOSE, CAPILLARY: Glucose-Capillary: 109 mg/dL — ABNORMAL HIGH (ref 65–99)

## 2015-08-05 MED ORDER — SODIUM CHLORIDE 0.9% FLUSH
3.0000 mL | Freq: Two times a day (BID) | INTRAVENOUS | Status: DC
  Administered 2015-08-06: 3 mL via INTRAVENOUS

## 2015-08-05 MED ORDER — SODIUM CHLORIDE 0.9 % IV SOLN
250.0000 mL | INTRAVENOUS | Status: DC | PRN
  Administered 2015-08-06: 250 mL via INTRAVENOUS

## 2015-08-05 MED ORDER — INFLUENZA VAC SPLIT QUAD 0.5 ML IM SUSY: 0.5000 mL | PREFILLED_SYRINGE | INTRAMUSCULAR | Status: DC

## 2015-08-05 MED ORDER — SODIUM CHLORIDE 0.9% FLUSH: 3.0000 mL | INTRAVENOUS | Status: DC | PRN

## 2015-08-05 NOTE — Progress Notes (Signed)
Patient has continued left lower quadrant pain due to diverticulitis sigmoid will continue IV Flagyl and Cipro monitor clinical course Kristen Ryan [id number] DOB: [birth year] DOA: 08/03/2015 PCP: Isabella Stalling, MD             Physical Exam: Blood pressure 130/71, pulse 111, temperature 99 F (37.2 C), temperature source Oral, resp. rate 19, height  (1.702 m), weight 163 lb 6.4 oz (74.118 kg), SpO2 97 %. lungs clear to A&P no rales wheeze rhonchi heart regular rhythm no S3-S4 no heaves thrills rubs positive (quadrant tenderness positive guarding no rebound bowel sounds normoactive no peristaltic rushes no detectable organomegaly   Investigations:  Recent Results (from the past 240 hour(s))  Urine culture     Status: None   Collection Time: 08/03/15 12:16 PM  Result Value Ref Range Status   Specimen Description URINE, CLEAN CATCH  Final   Special Requests NONE  Final   Culture   Final    MULTIPLE SPECIES PRESENT, SUGGEST RECOLLECTION Performed at [facility]    Report Status 08/04/2015 FINAL  Final     Basic Metabolic Panel:  Recent Labs  95/62/13 1136 08/04/15 0657  NA 139 141  K 3.3* 4.6  CL 103 108  CO2 25 25  GLUCOSE 135* 99  BUN 11 7  CREATININE 0.86 0.69  CALCIUM 8.9 8.4*  MG 1.9  --    Liver Function Tests:  Recent Labs  08/03/15 1136 08/04/15 0657  AST 16 15  ALT 22 18  ALKPHOS 82 70  BILITOT 0.7 0.7  PROT 6.5 5.6*  ALBUMIN 3.1* 2.6*     CBC:  Recent Labs  08/03/15 1136 08/04/15 0657  WBC 18.4* 13.4*  NEUTROABS 15.0* 9.6*  HGB 10.4* 8.9*  HCT 34.0* 28.7*  MCV 89.9 91.1  PLT 202 182    Ct Abdomen Pelvis W Contrast  08/03/2015  CLINICAL DATA:  Lower abdominal pain and fevers EXAM: CT ABDOMEN AND PELVIS WITH CONTRAST TECHNIQUE: Multidetector CT imaging of the abdomen and pelvis was performed using the standard protocol following bolus administration of intravenous contrast. CONTRAST:  25mL OMNIPAQUE IOHEXOL 300  MG/ML SOLN, OMNIPAQUE IOHEXOL 300 MG/ML SOLN COMPARISON:  None. FINDINGS: Lung bases are free of acute infiltrate or sizable effusion. The liver, gallbladder, spleen, adrenal glands and pancreas are within normal limits. Kidneys are well visualized bilaterally and demonstrate a normal enhancement pattern. A 1 cm cyst is noted in the upper pole of the left kidney. No calculi or obstructive changes are noted. The bladder is well distended. The appendix is barium filled and within normal limits. Diverticular change of the colon is noted particularly in the sigmoid colon with areas of inflammatory change consistent with diverticulitis. There is a focally enlarged tick best seen on images 64-70 of series 2 with inflammatory changes. No definitive perforation or abscess formation is seen. No free pelvic fluid is noted. No bony abnormality is seen. The uterus has been surgically removed. No acute bony abnormality is seen. IMPRESSION: Changes consistent with sigmoid diverticulitis without evidence of perforation or abscess formation. Electronically Signed   By: Alcide Clever M.D.   On: 08/03/2015 13:29      Medications:   Impression:  Principal Problem:   Sigmoid diverticulitis Active Problems:   Hypokalemia   Normocytic anemia   Acute diverticulitis   COPD with asthma (HCC)   Sepsis (HCC)   Thyroid activity decreased   Bipolar disorder in partial remission (HCC)     Plan:  Continue IV Flagyl and Cipro  Consultants:    Procedures   Antibiotics:                   Code Status: Full   Family Communication:  '  Disposition Plan see plan above  Time spent: 30 minutes   LOS: 2 days   Bernardine Langworthy M   08/05/2015, 12:01 PM

## 2015-08-06 LAB — CBC WITH DIFFERENTIAL/PLATELET
BASOS ABS: 0 10*3/uL (ref 0.0–0.1)
Basophils Relative: 0 %
EOS ABS: 0.1 10*3/uL (ref 0.0–0.7)
EOS PCT: 2 %
HCT: 26.8 % — ABNORMAL LOW (ref 36.0–46.0)
HEMOGLOBIN: 8.3 g/dL — AB (ref 12.0–15.0)
LYMPHS PCT: 18 %
Lymphs Abs: 1.6 10*3/uL (ref 0.7–4.0)
MCH: 27.8 pg (ref 26.0–34.0)
MCHC: 31 g/dL (ref 30.0–36.0)
MCV: 89.6 fL (ref 78.0–100.0)
Monocytes Absolute: 0.8 10*3/uL (ref 0.1–1.0)
Monocytes Relative: 9 %
NEUTROS PCT: 71 %
Neutro Abs: 6.4 10*3/uL (ref 1.7–7.7)
PLATELETS: 169 10*3/uL (ref 150–400)
RBC: 2.99 MIL/uL — AB (ref 3.87–5.11)
RDW: 14.8 % (ref 11.5–15.5)
WBC: 9 10*3/uL (ref 4.0–10.5)

## 2015-08-06 LAB — GLUCOSE, CAPILLARY: GLUCOSE-CAPILLARY: 101 mg/dL — AB (ref 65–99)

## 2015-08-06 MED ORDER — METRONIDAZOLE 500 MG PO TABS: 500.0000 mg | ORAL_TABLET | Freq: Three times a day (TID) | ORAL | Status: AC

## 2015-08-06 MED ORDER — CIPROFLOXACIN HCL 250 MG PO TABS
500.0000 mg | ORAL_TABLET | Freq: Two times a day (BID) | ORAL | Status: DC
  Administered 2015-08-06: 500 mg via ORAL
  Filled 2015-08-06: qty 2

## 2015-08-06 MED ORDER — METRONIDAZOLE 500 MG PO TABS
500.0000 mg | ORAL_TABLET | Freq: Three times a day (TID) | ORAL | Status: DC
  Administered 2015-08-06: 500 mg via ORAL
  Filled 2015-08-06: qty 1

## 2015-08-06 MED ORDER — CIPROFLOXACIN HCL 500 MG PO TABS: 500.0000 mg | ORAL_TABLET | Freq: Two times a day (BID) | ORAL | Status: AC

## 2015-08-06 MED ORDER — HYDROCODONE-ACETAMINOPHEN 5-325 MG PO TABS: 1.0000 | ORAL_TABLET | ORAL | Status: DC | PRN

## 2015-08-06 NOTE — Care Management Important Message (Signed)
Important Message  Patient Details  Name: Kristen Ryan MRN: [id number] Date of Birth: 03/24/1956   Medicare Important Message Given:  Yes    Adonis Huguenin, RN 08/06/2015, 1:48 PM

## 2015-08-06 NOTE — Care Management Note (Signed)
Case Management Note  Patient Details  Name: REGLA FITZGIBBON MRN: [id number] Date of Birth: 10-23-1955  Subjective/Objective:          Spoke with patient for discharge planning. Patient is from home with husband alert and oriented. Drives self normally, Denies any issue with copays on medications. Uses no DME. No O2      No CM needs Identified.     Action/Plan:  Will discharge home with self care. Expected Discharge Date:                  Expected Discharge Plan:  Home/Self Care  In-House Referral:     Discharge planning Services  CM Consult  Post Acute Care Choice:    Choice offered to:     DME Arranged:    DME Agency:     HH Arranged:    HH Agency:     Status of Service:  Completed, signed off  Medicare Important Message Given:    Date Medicare IM Given:    Medicare IM give by:    Date Additional Medicare IM Given:    Additional Medicare Important Message give by:     If discussed at Long Length of Stay Meetings, dates discussed:    Additional Comments:  Adonis Huguenin, RN 08/06/2015, 12:23 PM

## 2015-08-06 NOTE — Discharge Summary (Signed)
Physician Discharge Summary  Kristen Ryan:[id number] DOB: [birth year] DOA: 08/03/2015  PCP: Isabella Stalling, MD  Admit date: 08/03/2015 Discharge date: 08/06/2015   Recommendations for Outpatient Follow-up:  Patient is advised to take Flagyl 500 mg by mouth 3 times a day for 7 days additionally and Cipro 500 by mouth twice a day this additional 7 days as well as her opioid analgesia 30 tablets given and follow my office in 2 to three-day time to assess abdominal discomfort due to sigmoid diverticulitis Discharge Diagnoses:  Principal Problem:   Sigmoid diverticulitis Active Problems:   Hypokalemia   Normocytic anemia   Acute diverticulitis   COPD with asthma (HCC)   Sepsis (HCC)   Thyroid activity decreased   Bipolar disorder in partial remission [facility])   Discharge Condition: Good and improving  Filed Weights   08/04/15 0436 08/05/15 0643 08/06/15 0401  Weight: 161 lb 6 oz (73.2 kg) 163 lb 6.4 oz (74.118 kg) 164 lb 3.2 oz (74.481 kg)    History of present illness:  Should 60 year old white female with trivial bowel syndrome. Flow quadrant tenderness CAT scan upon admission was consistent with sigmoid diverticulitis without any signs of perforation she had mildly elevated leukocytosis with treated aggressively with intravenous antibiotics in form of Cipro and Flagyl IV abdominal tenderness subsided 80% over 3 days was no she was tolerating foods was not intravascular 5 depleted was felt safe to continue oral antibiotics and follow-up as an outpatient closely in office which she agreed to  Hospital Course:  See history of present illness  Procedures:    Consultations:    Discharge Instructions  Discharge Instructions    Discharge instructions    Complete by:  As directed      Discharge patient    Complete by:  As directed             Medication List    STOP taking these medications        ALLERGY RELIEF 10 MG tablet  Generic drug:  loratadine        TAKE these medications        ALPRAZolam 1 MG tablet  Commonly known as:  XANAX  Take 1 mg by mouth 3 (three) times daily as needed for sleep or anxiety.     aspirin 325 MG tablet  Take 325 mg by mouth daily.     budesonide-formoterol 160-4.5 MCG/ACT inhaler  Commonly known as:  SYMBICORT  Inhale 2 puffs into the lungs 2 (two) times daily.     ciprofloxacin 500 MG tablet  Commonly known as:  CIPRO  Take 1 tablet (500 mg total) by mouth 2 (two) times daily.     dicyclomine 10 MG capsule  Commonly known as:  BENTYL  TAKE 1 CAPSULE BY MOUTH THREE TIMES DAILY AS NEEDED FOR SPASMS.     HAIR/SKIN/NAILS PO  Take 1 tablet by mouth daily.     HYDROcodone-acetaminophen 5-325 MG tablet  Commonly known as:  NORCO/VICODIN  Take 1 tablet by mouth every 4 (four) hours as needed for moderate pain.     levothyroxine 50 MCG tablet  Commonly known as:  SYNTHROID, LEVOTHROID  Take 50 mcg by mouth daily.     metroNIDAZOLE 500 MG tablet  Commonly known as:  FLAGYL  Take 1 tablet (500 mg total) by mouth 3 (three) times daily.     omeprazole 20 MG capsule  Commonly known as:  PRILOSEC  Take 1 capsule by mouth daily.  PROVENTIL HFA IN  Inhale 1-2 puffs into the lungs as needed (shorntess of breath).     simvastatin 40 MG tablet  Commonly known as:  ZOCOR  Take 1 tablet by mouth daily.     sodium chloride 0.65 % Soln nasal spray  Commonly known as:  OCEAN  Place 1 spray into both nostrils as needed for congestion.     vitamin C 500 MG tablet  Commonly known as:  ASCORBIC ACID  Take 500 mg by mouth daily.     Vitamin D3 5000 units Caps  Take 1 capsule by mouth daily.     ziprasidone 60 MG capsule  Commonly known as:  GEODON  Take 1 capsule by mouth 2 (two) times daily.       Allergies  Allergen Reactions  . Aminophylline Anaphylaxis  . Sumatriptan Anaphylaxis and Other (See Comments)    Causes fainting  . Theophylline Anaphylaxis  . Codeine Itching and Other (See  Comments)    Too strong for patient   . Depakote [Divalproex Sodium] Other (See Comments)    Hair loss  . Divalproex Sodium Other (See Comments)    Causes hair to fall out  . Doxycycline Other (See Comments)    headache  . Lamictal [Lamotrigine]     Destroys thyroid  . Magnesium-Containing Compounds Hives  . Naproxen Swelling  . Other     Hair dye  . Penicillins Nausea And Vomiting    Has patient had a PCN reaction causing immediate rash, facial/tongue/throat swelling, SOB or lightheadedness with hypotension: Yes Has patient had a PCN reaction causing severe rash involving mucus membranes or skin necrosis: No Has patient had a PCN reaction that required hospitalization Yes Has patient had a PCN reaction occurring within the last 10 years: No If all of the above answers are "NO", then may proceed with Cephalosporin use.   Marland Kitchen Pentazocine Lactate Other (See Comments)    Unknown reaction  . Risperidone Other (See Comments)    Insomnia   . Seroquel [Quetiapine] Swelling  . Tegretol [Carbamazepine] Swelling  . Tetracyclines & Related   . Vistaril [Hydroxyzine Hcl] Nausea And Vomiting  . Tetracycline Rash    Headaches  . Zyprexa [Olanzapine] Rash and Other (See Comments)    Insomnia      The results of significant diagnostics from this hospitalization (including imaging, microbiology, ancillary and laboratory) are listed below for reference.    Significant Diagnostic Studies: Ct Abdomen Pelvis W Contrast  08/03/2015  CLINICAL DATA:  Lower abdominal pain and fevers EXAM: CT ABDOMEN AND PELVIS WITH CONTRAST TECHNIQUE: Multidetector CT imaging of the abdomen and pelvis was performed using the standard protocol following bolus administration of intravenous contrast. CONTRAST:  25mL OMNIPAQUE IOHEXOL 300 MG/ML SOLN, OMNIPAQUE IOHEXOL 300 MG/ML SOLN COMPARISON:  None. FINDINGS: Lung bases are free of acute infiltrate or sizable effusion. The liver, gallbladder, spleen, adrenal  glands and pancreas are within normal limits. Kidneys are well visualized bilaterally and demonstrate a normal enhancement pattern. A 1 cm cyst is noted in the upper pole of the left kidney. No calculi or obstructive changes are noted. The bladder is well distended. The appendix is barium filled and within normal limits. Diverticular change of the colon is noted particularly in the sigmoid colon with areas of inflammatory change consistent with diverticulitis. There is a focally enlarged tick best seen on images 64-70 of series 2 with inflammatory changes. No definitive perforation or abscess formation is seen. No free pelvic fluid is  noted. No bony abnormality is seen. The uterus has been surgically removed. No acute bony abnormality is seen. IMPRESSION: Changes consistent with sigmoid diverticulitis without evidence of perforation or abscess formation. Electronically Signed   By: Alcide Clever M.D.   On: 08/03/2015 13:29    Microbiology: Recent Results (from the past 240 hour(s))  Urine culture     Status: None   Collection Time: 08/03/15 12:16 PM  Result Value Ref Range Status   Specimen Description URINE, CLEAN CATCH  Final   Special Requests NONE  Final   Culture   Final    MULTIPLE SPECIES PRESENT, SUGGEST RECOLLECTION Performed at [facility]    Report Status 08/04/2015 FINAL  Final     Labs: Basic Metabolic Panel:  Recent Labs Lab 08/03/15 1136 08/04/15 0657  NA 139 141  K 3.3* 4.6  CL 103 108  CO2 25 25  GLUCOSE 135* 99  BUN 11 7  CREATININE 0.86 0.69  CALCIUM 8.9 8.4*  MG 1.9  --    Liver Function Tests:  Recent Labs Lab 08/03/15 1136 08/04/15 0657  AST 16 15  ALT 22 18  ALKPHOS 82 70  BILITOT 0.7 0.7  PROT 6.5 5.6*  ALBUMIN 3.1* 2.6*    Recent Labs Lab 08/03/15 1136  LIPASE 22   No results for input(s): AMMONIA in the last 168 hours. CBC:  Recent Labs Lab 08/03/15 1136 08/04/15 0657 08/06/15 0510  WBC 18.4* 13.4* 9.0  NEUTROABS 15.0*  9.6* 6.4  HGB 10.4* 8.9* 8.3*  HCT 34.0* 28.7* 26.8*  MCV 89.9 91.1 89.6  PLT 202 182 169   Cardiac Enzymes: No results for input(s): CKTOTAL, CKMB, CKMBINDEX, TROPONINI in the last 168 hours. BNP: BNP (last 3 results) No results for input(s): BNP in the last 8760 hours.  ProBNP (last 3 results) No results for input(s): PROBNP in the last 8760 hours.  CBG:  Recent Labs Lab 08/04/15 0714 08/05/15 0733 08/06/15 0739  GLUCAP 88 109* 101*       Signed:  Verlyn Dannenberg M  [facility] Pager: [phone] 08/06/2015, 12:58 PM

## 2015-08-06 NOTE — Care Management (Signed)
Denial received from [facility]. Called Medical director to discuss case. Dr Jacky Kindle would like to call peer to peer to [facility]. Contacted Candice Crotts at [facility] at [phone]. [employer] wil contatct Dr Jacky Kindle for Peer to Peer.

## 2015-08-06 NOTE — Progress Notes (Signed)
Discharged PT per MD order and protocol. Reviewed discharge teaching and handouts given. Pt verbalized understanding and left with all belongings. Prescriptions given and explained. VSS. IV catheter D/C.  Patient wheeled down by staff member.

## 2015-08-23 ENCOUNTER — Inpatient Hospital Stay (HOSPITAL_COMMUNITY)
Admission: EM | Admit: 2015-08-23 | Discharge: 2015-08-26 | DRG: 190 | Disposition: A | Discharge status: Home Health | Payer: Medicare Other | Attending: Family Medicine | Admitting: Family Medicine

## 2015-08-23 ENCOUNTER — Encounter (HOSPITAL_COMMUNITY): Payer: Self-pay | Admitting: Emergency Medicine

## 2015-08-23 ENCOUNTER — Emergency Department (HOSPITAL_COMMUNITY): Payer: Medicare Other

## 2015-08-23 DIAGNOSIS — Z7982 Long term (current) use of aspirin: Secondary | ICD-10-CM | POA: Diagnosis not present

## 2015-08-23 DIAGNOSIS — E039 Hypothyroidism, unspecified: Secondary | ICD-10-CM | POA: Diagnosis present

## 2015-08-23 DIAGNOSIS — Z23 Encounter for immunization: Secondary | ICD-10-CM

## 2015-08-23 DIAGNOSIS — J209 Acute bronchitis, unspecified: Secondary | ICD-10-CM | POA: Diagnosis not present

## 2015-08-23 DIAGNOSIS — J069 Acute upper respiratory infection, unspecified: Secondary | ICD-10-CM

## 2015-08-23 DIAGNOSIS — F319 Bipolar disorder, unspecified: Secondary | ICD-10-CM | POA: Diagnosis present

## 2015-08-23 DIAGNOSIS — Z87891 Personal history of nicotine dependence: Secondary | ICD-10-CM | POA: Diagnosis not present

## 2015-08-23 DIAGNOSIS — F310 Bipolar disorder, current episode hypomanic: Secondary | ICD-10-CM

## 2015-08-23 DIAGNOSIS — F209 Schizophrenia, unspecified: Secondary | ICD-10-CM | POA: Diagnosis not present

## 2015-08-23 DIAGNOSIS — J45909 Unspecified asthma, uncomplicated: Secondary | ICD-10-CM | POA: Diagnosis present

## 2015-08-23 DIAGNOSIS — Z7951 Long term (current) use of inhaled steroids: Secondary | ICD-10-CM | POA: Diagnosis not present

## 2015-08-23 DIAGNOSIS — K589 Irritable bowel syndrome without diarrhea: Secondary | ICD-10-CM | POA: Diagnosis not present

## 2015-08-23 DIAGNOSIS — J9601 Acute respiratory failure with hypoxia: Secondary | ICD-10-CM | POA: Diagnosis present

## 2015-08-23 DIAGNOSIS — J449 Chronic obstructive pulmonary disease, unspecified: Secondary | ICD-10-CM

## 2015-08-23 DIAGNOSIS — J440 Chronic obstructive pulmonary disease with acute lower respiratory infection: Secondary | ICD-10-CM | POA: Diagnosis present

## 2015-08-23 DIAGNOSIS — S52531A Colles' fracture of right radius, initial encounter for closed fracture: Secondary | ICD-10-CM

## 2015-08-23 DIAGNOSIS — J441 Chronic obstructive pulmonary disease with (acute) exacerbation: Secondary | ICD-10-CM | POA: Diagnosis present

## 2015-08-23 DIAGNOSIS — E02 Subclinical iodine-deficiency hypothyroidism: Secondary | ICD-10-CM

## 2015-08-23 DIAGNOSIS — R3915 Urgency of urination: Secondary | ICD-10-CM

## 2015-08-23 HISTORY — DX: Acute respiratory failure with hypoxia: J96.01

## 2015-08-23 LAB — BASIC METABOLIC PANEL
ANION GAP: 11 (ref 5–15)
BUN: 5 mg/dL — ABNORMAL LOW (ref 6–20)
CALCIUM: 8.5 mg/dL — AB (ref 8.9–10.3)
CHLORIDE: 101 mmol/L (ref 101–111)
CO2: 26 mmol/L (ref 22–32)
Creatinine, Ser: 0.66 mg/dL (ref 0.44–1.00)
GFR calc non Af Amer: >60 mL/min (ref >60)
Glucose, Bld: 132 mg/dL — ABNORMAL HIGH (ref 65–99)
Potassium: 3.3 mmol/L — ABNORMAL LOW (ref 3.5–5.1)
SODIUM: 138 mmol/L (ref 135–145)

## 2015-08-23 LAB — CBC WITH DIFFERENTIAL/PLATELET
BASOS PCT: 0 %
Basophils Absolute: 0 10*3/uL (ref 0.0–0.1)
EOS ABS: 0 10*3/uL (ref 0.0–0.7)
Eosinophils Relative: 0 %
HEMATOCRIT: 35.2 % — AB (ref 36.0–46.0)
HEMOGLOBIN: 10.8 g/dL — AB (ref 12.0–15.0)
Lymphocytes Relative: 7 %
Lymphs Abs: 0.7 10*3/uL (ref 0.7–4.0)
MCH: 26.7 pg (ref 26.0–34.0)
MCHC: 30.7 g/dL (ref 30.0–36.0)
MCV: 87.1 fL (ref 78.0–100.0)
MONOS PCT: 6 %
Monocytes Absolute: 0.6 10*3/uL (ref 0.1–1.0)
NEUTROS ABS: 8.9 10*3/uL — AB (ref 1.7–7.7)
NEUTROS PCT: 87 %
Platelets: 270 10*3/uL (ref 150–400)
RBC: 4.04 MIL/uL (ref 3.87–5.11)
RDW: 14.6 % (ref 11.5–15.5)
WBC: 10.2 10*3/uL (ref 4.0–10.5)

## 2015-08-23 LAB — INFLUENZA PANEL BY PCR (TYPE A & B)
H1N1FLUPCR: NOT DETECTED
INFLAPCR: NEGATIVE
Influenza B By PCR: NEGATIVE

## 2015-08-23 MED ORDER — GUAIFENESIN-DM 100-10 MG/5ML PO SYRP
5.0000 mL | ORAL_SOLUTION | ORAL | Status: DC | PRN
  Administered 2015-08-24 – 2015-08-25 (×3): 5 mL via ORAL
  Filled 2015-08-23 (×4): qty 5

## 2015-08-23 MED ORDER — DEXTROSE 5 % IV SOLN
500.0000 mg | INTRAVENOUS | Status: DC
  Administered 2015-08-25 (×2): 500 mg via INTRAVENOUS
  Filled 2015-08-23 (×3): qty 500

## 2015-08-23 MED ORDER — POTASSIUM CHLORIDE CRYS ER 20 MEQ PO TBCR
40.0000 meq | EXTENDED_RELEASE_TABLET | Freq: Two times a day (BID) | ORAL | Status: AC
  Administered 2015-08-23 – 2015-08-24 (×2): 40 meq via ORAL
  Filled 2015-08-23 (×2): qty 2

## 2015-08-23 MED ORDER — ACETAMINOPHEN 325 MG PO TABS
650.0000 mg | ORAL_TABLET | Freq: Once | ORAL | Status: AC
  Administered 2015-08-23: 650 mg via ORAL
  Filled 2015-08-23: qty 2

## 2015-08-23 MED ORDER — DICYCLOMINE HCL 10 MG PO CAPS
10.0000 mg | ORAL_CAPSULE | Freq: Three times a day (TID) | ORAL | Status: DC
  Administered 2015-08-24 – 2015-08-26 (×7): 10 mg via ORAL
  Filled 2015-08-23 (×7): qty 1

## 2015-08-23 MED ORDER — ALBUTEROL SULFATE (2.5 MG/3ML) 0.083% IN NEBU
5.0000 mg | INHALATION_SOLUTION | Freq: Once | RESPIRATORY_TRACT | Status: AC
  Administered 2015-08-23: 5 mg via RESPIRATORY_TRACT
  Filled 2015-08-23: qty 6

## 2015-08-23 MED ORDER — ONDANSETRON HCL 4 MG/2ML IJ SOLN: 4.0000 mg | Freq: Four times a day (QID) | INTRAMUSCULAR | Status: DC | PRN

## 2015-08-23 MED ORDER — ZIPRASIDONE HCL 60 MG PO CAPS
60.0000 mg | ORAL_CAPSULE | Freq: Two times a day (BID) | ORAL | Status: DC
  Administered 2015-08-24 – 2015-08-26 (×6): 60 mg via ORAL
  Filled 2015-08-23: qty 1
  Filled 2015-08-23 (×3): qty 3
  Filled 2015-08-23: qty 1
  Filled 2015-08-23: qty 3
  Filled 2015-08-23 (×2): qty 1
  Filled 2015-08-23 (×2): qty 3

## 2015-08-23 MED ORDER — ALBUTEROL SULFATE (2.5 MG/3ML) 0.083% IN NEBU: 5.0000 mg | INHALATION_SOLUTION | Freq: Once | RESPIRATORY_TRACT | Status: DC

## 2015-08-23 MED ORDER — BUDESONIDE-FORMOTEROL FUMARATE 160-4.5 MCG/ACT IN AERO
2.0000 | INHALATION_SPRAY | Freq: Two times a day (BID) | RESPIRATORY_TRACT | Status: DC
  Administered 2015-08-24 – 2015-08-26 (×4): 2 via RESPIRATORY_TRACT
  Filled 2015-08-23: qty 6

## 2015-08-23 MED ORDER — DEXTROSE 5 % IV SOLN
500.0000 mg | INTRAVENOUS | Status: DC
  Administered 2015-08-23: 500 mg via INTRAVENOUS
  Filled 2015-08-23 (×3): qty 500

## 2015-08-23 MED ORDER — SIMVASTATIN 20 MG PO TABS
40.0000 mg | ORAL_TABLET | Freq: Every day | ORAL | Status: DC
  Administered 2015-08-24 – 2015-08-26 (×3): 40 mg via ORAL
  Filled 2015-08-23: qty 4
  Filled 2015-08-23 (×3): qty 2

## 2015-08-23 MED ORDER — LEVOTHYROXINE SODIUM 50 MCG PO TABS
50.0000 ug | ORAL_TABLET | Freq: Every day | ORAL | Status: DC
  Administered 2015-08-24 – 2015-08-26 (×3): 50 ug via ORAL
  Filled 2015-08-23 (×3): qty 1

## 2015-08-23 MED ORDER — ACETAMINOPHEN 650 MG RE SUPP: 650.0000 mg | Freq: Four times a day (QID) | RECTAL | Status: DC | PRN

## 2015-08-23 MED ORDER — PANTOPRAZOLE SODIUM 40 MG PO TBEC
40.0000 mg | DELAYED_RELEASE_TABLET | Freq: Every day | ORAL | Status: DC
  Administered 2015-08-23 – 2015-08-26 (×4): 40 mg via ORAL
  Filled 2015-08-23 (×4): qty 1

## 2015-08-23 MED ORDER — METHYLPREDNISOLONE SODIUM SUCC 125 MG IJ SOLR
60.0000 mg | Freq: Two times a day (BID) | INTRAMUSCULAR | Status: DC
  Administered 2015-08-24: 60 mg via INTRAVENOUS
  Filled 2015-08-23: qty 2

## 2015-08-23 MED ORDER — ACETAMINOPHEN 325 MG PO TABS
650.0000 mg | ORAL_TABLET | Freq: Four times a day (QID) | ORAL | Status: DC | PRN
  Administered 2015-08-24 – 2015-08-25 (×2): 650 mg via ORAL
  Filled 2015-08-23 (×2): qty 2

## 2015-08-23 MED ORDER — ENOXAPARIN SODIUM 40 MG/0.4ML ~~LOC~~ SOLN
40.0000 mg | SUBCUTANEOUS | Status: DC
  Administered 2015-08-23 – 2015-08-25 (×3): 40 mg via SUBCUTANEOUS
  Filled 2015-08-23 (×3): qty 0.4

## 2015-08-23 MED ORDER — ASPIRIN 325 MG PO TABS
325.0000 mg | ORAL_TABLET | Freq: Every day | ORAL | Status: DC
  Administered 2015-08-24 – 2015-08-26 (×3): 325 mg via ORAL
  Filled 2015-08-23 (×4): qty 1

## 2015-08-23 MED ORDER — ONDANSETRON HCL 4 MG PO TABS: 4.0000 mg | ORAL_TABLET | Freq: Four times a day (QID) | ORAL | Status: DC | PRN

## 2015-08-23 MED ORDER — ALPRAZOLAM 1 MG PO TABS
1.0000 mg | ORAL_TABLET | Freq: Three times a day (TID) | ORAL | Status: DC | PRN
  Administered 2015-08-24 – 2015-08-26 (×5): 1 mg via ORAL
  Filled 2015-08-23 (×5): qty 1

## 2015-08-23 MED ORDER — ALUM & MAG HYDROXIDE-SIMETH 200-200-20 MG/5ML PO SUSP: 30.0000 mL | Freq: Four times a day (QID) | ORAL | Status: DC | PRN

## 2015-08-23 MED ORDER — ALBUTEROL SULFATE (2.5 MG/3ML) 0.083% IN NEBU
2.5000 mg | INHALATION_SOLUTION | RESPIRATORY_TRACT | Status: DC | PRN
  Administered 2015-08-24: 2.5 mg via RESPIRATORY_TRACT
  Filled 2015-08-23: qty 3

## 2015-08-23 MED ORDER — IPRATROPIUM BROMIDE 0.02 % IN SOLN
0.5000 mg | Freq: Once | RESPIRATORY_TRACT | Status: AC
  Administered 2015-08-23: 0.5 mg via RESPIRATORY_TRACT
  Filled 2015-08-23: qty 2.5

## 2015-08-23 MED ORDER — METHYLPREDNISOLONE SODIUM SUCC 125 MG IJ SOLR
125.0000 mg | Freq: Once | INTRAMUSCULAR | Status: AC
  Administered 2015-08-23: 125 mg via INTRAVENOUS
  Filled 2015-08-23: qty 2

## 2015-08-23 MED ORDER — LEVOTHYROXINE SODIUM 50 MCG PO TABS
50.0000 ug | ORAL_TABLET | Freq: Every day | ORAL | Status: DC
  Administered 2015-08-23: 50 ug via ORAL
  Filled 2015-08-23: qty 1

## 2015-08-23 MED ORDER — OXYCODONE HCL 5 MG PO TABS
5.0000 mg | ORAL_TABLET | Freq: Two times a day (BID) | ORAL | Status: DC | PRN
  Administered 2015-08-24 – 2015-08-26 (×4): 5 mg via ORAL
  Filled 2015-08-23 (×5): qty 1

## 2015-08-23 MED ORDER — ALBUTEROL SULFATE (2.5 MG/3ML) 0.083% IN NEBU
2.5000 mg | INHALATION_SOLUTION | Freq: Four times a day (QID) | RESPIRATORY_TRACT | Status: DC
  Administered 2015-08-23: 2.5 mg via RESPIRATORY_TRACT
  Filled 2015-08-23: qty 3

## 2015-08-23 MED ORDER — IPRATROPIUM BROMIDE 0.02 % IN SOLN
0.5000 mg | Freq: Four times a day (QID) | RESPIRATORY_TRACT | Status: DC
  Administered 2015-08-23: 0.5 mg via RESPIRATORY_TRACT
  Filled 2015-08-23: qty 2.5

## 2015-08-23 NOTE — ED Notes (Signed)
Physician in to evaluate 

## 2015-08-23 NOTE — ED Notes (Signed)
Pt placed on 2L [place], O2 saturation dropped to 91%. O2 saturation rose to 95% and pt stated ease in breathing.

## 2015-08-23 NOTE — ED Notes (Signed)
Pt states that she has had a nonproductive cough for over a week.  Was recently hospitalized for the same.  States that it never really resolved and she is also short of breath and hurting all over.

## 2015-08-23 NOTE — H&P (Signed)
History and Physical  Kristen Ryan ZOX:[id number] DOB: February 27, 1956 DOA: 08/23/2015  Referring physician: Dr Kristen Ryan, ED physician PCP: Kristen Stalling, MD   Chief Complaint: SOB  HPI: Kristen Ryan is a 60 y.o. female  With a history of COPD, asthma, bipolar type I, hypothyroidism, IBS. Patient seen in emergency department for 2-1/2 weeks of shortness of breath with intermittent fevers that started approximately 3-4 days ago. She was breath worse with ambulation improves with rest. He reports 2 other family members with URIs. She's been recently hospitalized from January 28 until January 31 for diverticulitis. She states that she had shortness of breath at that time. No exposure to flu virus to her knowledge.   Review of Systems:   Pt complains of lack of appetite.  Pt denies any fevers, chills, nausea, vomiting, diarrhea, constipation, abdominal pain, shortness of breath, dyspnea on exertion, orthopnea, cough, wheezing, palpitations, headache, vision changes, lightheadedness, dizziness, diarrhea, constipation, melena, rectal bleeding.  Review of systems are otherwise negative  Past Medical History  Diagnosis Date  . Asthma   . Bipolar 1 disorder (HCC)   . Schizophrenia (HCC)   . Neuropathy (HCC)   . Bronchitis   . Thyroid disease   . COPD (chronic obstructive pulmonary disease) (HCC)   . IBS (irritable bowel syndrome)   . Anemia    Past Surgical History  Procedure Laterality Date  . Abdominal hysterectomy    . Tonsillectomy    . Knee surgery    . Elbow surgery    . Foot surgery    . Cesarean section    . Broke left arm    . Colonoscopy N/A 08/30/2014    Procedure: COLONOSCOPY;  Surgeon: Malissa Hippo, MD;  Location: [facility];  Service: Endoscopy;  Laterality: N/A;  1200   Social History:  reports that she quit smoking about 7 years ago. Her smoking use included Cigarettes. She smoked 4.00 packs per day. She has never used smokeless tobacco. She reports  that she does not drink alcohol or use illicit drugs. Patient lives at home & is able to participate in activities of daily living  Allergies  Allergen Reactions  . Aminophylline Anaphylaxis  . Sumatriptan Anaphylaxis and Other (See Comments)    Causes fainting  . Theophylline Anaphylaxis  . Codeine Itching and Other (See Comments)    Too strong for patient   . Depakote [Divalproex Sodium] Other (See Comments)    Hair loss  . Divalproex Sodium Other (See Comments)    Causes hair to fall out  . Doxycycline Other (See Comments)    headache  . Lamictal [Lamotrigine]     Destroys thyroid  . Magnesium-Containing Compounds Hives  . Naproxen Swelling  . Other     Hair dye  . Penicillins Nausea And Vomiting    Has patient had a PCN reaction causing immediate rash, facial/tongue/throat swelling, SOB or lightheadedness with hypotension: Yes Has patient had a PCN reaction causing severe rash involving mucus membranes or skin necrosis: No Has patient had a PCN reaction that required hospitalization Yes Has patient had a PCN reaction occurring within the last 10 years: No If all of the above answers are "NO", then may proceed with Cephalosporin use.   Marland Kitchen Pentazocine Lactate Other (See Comments)    Unknown reaction  . Risperidone Other (See Comments)    Insomnia   . Seroquel [Quetiapine] Swelling  . Tegretol [Carbamazepine] Swelling  . Tetracyclines & Related   . Vistaril [Hydroxyzine Hcl] Nausea  And Vomiting  . Tetracycline Rash    Headaches  . Zyprexa [Olanzapine] Rash and Other (See Comments)    Insomnia    Family History  Problem Relation Age of Onset  . Dementia Mother   . Dementia Father   . Other Son     MVA accident      Prior to Admission medications   Medication Sig Start Date End Date Taking? Authorizing Provider  Albuterol Sulfate (PROVENTIL HFA IN) Inhale 1-2 puffs into the lungs as needed (shorntess of breath).     Historical Provider, MD  ALPRAZolam Prudy Feeler) 1  MG tablet Take 1 mg by mouth 3 (three) times daily as needed for sleep or anxiety.     Historical Provider, MD  aspirin 325 MG tablet Take 325 mg by mouth daily.    Historical Provider, MD  budesonide-formoterol (SYMBICORT) 160-4.5 MCG/ACT inhaler Inhale 2 puffs into the lungs 2 (two) times daily. 03/25/14   Lorre Nick, MD  Cholecalciferol (VITAMIN D3) 5000 units CAPS Take 1 capsule by mouth daily.    Historical Provider, MD  dicyclomine (BENTYL) 10 MG capsule TAKE 1 CAPSULE BY MOUTH THREE TIMES DAILY AS NEEDED FOR SPASMS. 07/09/15   Len Blalock, NP  levothyroxine (SYNTHROID, LEVOTHROID) 50 MCG tablet Take 50 mcg by mouth daily.    Historical Provider, MD  Multiple Vitamins-Minerals (HAIR/SKIN/NAILS PO) Take 1 tablet by mouth daily.    Historical Provider, MD  omeprazole (PRILOSEC) 20 MG capsule Take 1 capsule by mouth daily. 10/18/14   Historical Provider, MD  oxyCODONE (OXY IR/ROXICODONE) 5 MG immediate release tablet Take 5 mg by mouth 2 (two) times daily as needed. 08/20/15   Historical Provider, MD  simvastatin (ZOCOR) 40 MG tablet Take 1 tablet by mouth daily. 07/18/15   Historical Provider, MD  sodium chloride (OCEAN) 0.65 % SOLN nasal spray Place 1 spray into both nostrils as needed for congestion.    Historical Provider, MD  vitamin C (ASCORBIC ACID) 500 MG tablet Take 500 mg by mouth daily.    Historical Provider, MD  ziprasidone (GEODON) 60 MG capsule Take 1 capsule by mouth 2 (two) times daily. 10/08/14   Historical Provider, MD    Physical Exam: BP 159/83 mmHg  Pulse 101  Temp(Src) 102.9 F (39.4 C) (Rectal)  Resp 25  Ht  (1.702 m)  Wt 68.947 kg (152 lb)  BMI 23.80 kg/m2  SpO2 99%  General: Middle-aged Caucasian female who appears older than stated age. Awake and alert and oriented x3. No acute cardiopulmonary distress.  Eyes: Pupils equal, round, reactive to light. Extraocular muscles are intact. Sclerae anicteric and noninjected.  ENT:  Moist mucosal membranes. No  mucosal lesions.   Neck: Neck supple without lymphadenopathy. No carotid bruits. No masses palpated.  Cardiovascular: Regular rate with normal S1-S2 sounds. No murmurs, rubs, gallops auscultated. No JVD.  Respiratory: Tight sounding breath sounds with mild wheezes. No rales, rhonchi. Lungs clear to auscultation bilaterally.  Abdomen: Soft, nontender, nondistended. Active bowel sounds. No masses or hepatosplenomegaly  Skin: Dry, warm to touch. 2+ dorsalis pedis and radial pulses. Musculoskeletal: No calf or leg pain. All major joints not erythematous nontender.  Psychiatric: Intact judgment and insight.  Neurologic: No focal neurological deficits. Cranial nerves II through XII are grossly intact.           Labs on Admission:  Basic Metabolic Panel:  Recent Labs Lab 08/23/15 1835  NA 138  K 3.3*  CL 101  CO2 26  GLUCOSE 132*  BUN 5*  CREATININE 0.66  CALCIUM 8.5*   Liver Function Tests: No results for input(s): AST, ALT, ALKPHOS, BILITOT, PROT, ALBUMIN in the last 168 hours. No results for input(s): LIPASE, AMYLASE in the last 168 hours. No results for input(s): AMMONIA in the last 168 hours. CBC:  Recent Labs Lab 08/23/15 1835  WBC 10.2  NEUTROABS 8.9*  HGB 10.8*  HCT 35.2*  MCV 87.1  PLT 270   Cardiac Enzymes: No results for input(s): CKTOTAL, CKMB, CKMBINDEX, TROPONINI in the last 168 hours.  BNP (last 3 results) No results for input(s): BNP in the last 8760 hours.  ProBNP (last 3 results) No results for input(s): PROBNP in the last 8760 hours.  CBG: No results for input(s): GLUCAP in the last 168 hours.  Radiological Exams on Admission: Dg Chest 2 View  08/23/2015  CLINICAL DATA:  Dyspnea. EXAM: CHEST  2 VIEW COMPARISON:  11/06/2014. FINDINGS: Normal sized heart. Mildly increased linear density at the left lung base. Otherwise, clear lungs. The lungs remain mildly hyperexpanded with mildly prominent interstitial markings. Diffuse osteopenia. Stable mid  thoracic vertebral compression deformity. IMPRESSION: 1. Mild increase in linear atelectasis or scarring at the left lung base. 2. Stable mild changes of COPD. Electronically Signed   By: Beckie Salts M.D.   On: 08/23/2015 19:19    EKG: Independently reviewed. Sinus tachycardia. No acute ST elevation or depression.  Assessment/Plan Present on Admission:  . COPD (chronic obstructive pulmonary disease) with acute bronchitis (HCC) . Acute respiratory failure with hypoxia [facility])  This patient was discussed with the ED physician, including pertinent vitals, physical exam findings, labs, and imaging.  We also discussed care given by the ED provider.  #1 acute restricted with hypoxia  Admit to MedSurg  Nasal cannula #2 COPD with acute exacerbation  Patient swab for flu and other respiratory viruses  Solu-Medrol given emerge department, will continue 60 mg twice a day  Nebulizer treatments every 6 scheduled and every 2 when necessary  Start azithromycin as having some purulent sputum  Continue Symbicort #3  irritable bowel syndrome  Continue home medications #4 hypothyroidism  Continue levo thyroxine  DVT prophylaxis: Lovenox  Consultants: None  Code Status: Full code  Family Communication: None   Disposition Plan: Admission   Levie Heritage, DO [facility] Pager [phone]

## 2015-08-23 NOTE — ED Notes (Signed)
MD notified of rectal temperature of 102.9

## 2015-08-23 NOTE — ED Provider Notes (Signed)
CSN: [id number]     Arrival date & time 08/23/15  1809 History   First MD Initiated Contact with Patient 08/23/15 1830     Chief Complaint  Patient presents with  . Shortness of Breath     Patient is a 61 y.o. female presenting with shortness of breath. The history is provided by the patient.  Shortness of Breath Associated symptoms: abdominal pain, cough and wheezing   Associated symptoms: no chest pain    patient presents with shortness of breath. She's had it for at least the last week but states she had it when she was in the hospital about 2-1/2 weeks ago 2. She's had a cough with some production. She has left-sided chest pain with it. Has a history of CHF. Has had some chills. Reportedly 2 other family members have had URIs also. No relief with breathing treatments at home.   Past Medical History  Diagnosis Date  . Asthma   . Bipolar 1 disorder (HCC)   . Schizophrenia (HCC)   . Neuropathy (HCC)   . Bronchitis   . Thyroid disease   . COPD (chronic obstructive pulmonary disease) (HCC)   . IBS (irritable bowel syndrome)   . Anemia    Past Surgical History  Procedure Laterality Date  . Abdominal hysterectomy    . Tonsillectomy    . Knee surgery    . Elbow surgery    . Foot surgery    . Cesarean section    . Broke left arm    . Colonoscopy N/A 08/30/2014    Procedure: COLONOSCOPY;  Surgeon: Malissa Hippo, MD;  Location: [facility];  Service: Endoscopy;  Laterality: N/A;  1200   Family History  Problem Relation Age of Onset  . Dementia Mother   . Dementia Father   . Other Son     MVA accident   Social History  Substance Use Topics  . Smoking status: Former Smoker -- 4.00 packs/day    Types: Cigarettes    Quit date: 07/06/2008  . Smokeless tobacco: Never Used  . Alcohol Use: No   OB History    Gravida Para Term Preterm AB TAB SAB Ectopic Multiple Living   1 1        1      Review of Systems  Constitutional: Positive for appetite change and fatigue.   Respiratory: Positive for cough, shortness of breath and wheezing.   Cardiovascular: Negative for chest pain.  Gastrointestinal: Positive for abdominal pain.      Allergies  Aminophylline; Sumatriptan; Theophylline; Codeine; Depakote; Divalproex sodium; Doxycycline; Lamictal; Magnesium-containing compounds; Naproxen; Other; Penicillins; Pentazocine lactate; Risperidone; Seroquel; Tegretol; Tetracyclines & related; Vistaril; Tetracycline; and Zyprexa  Home Medications   Prior to Admission medications   Medication Sig Start Date End Date Taking? Authorizing Provider  ALPRAZolam Prudy Feeler) 1 MG tablet Take 1 mg by mouth 3 (three) times daily as needed for sleep or anxiety.    Yes Historical Provider, MD  aspirin 325 MG tablet Take 325 mg by mouth daily.   Yes Historical Provider, MD  budesonide-formoterol (SYMBICORT) 160-4.5 MCG/ACT inhaler Inhale 2 puffs into the lungs 2 (two) times daily. 03/25/14  Yes Lorre Nick, MD  dicyclomine (BENTYL) 10 MG capsule TAKE 1 CAPSULE BY MOUTH THREE TIMES DAILY AS NEEDED FOR SPASMS. Patient taking differently: TAKE 1 CAPSULE BY MOUTH THREE TIMES DAILY 07/09/15  Yes Len Blalock, NP  levothyroxine (SYNTHROID, LEVOTHROID) 50 MCG tablet Take 50 mcg by mouth daily.   Yes Historical  Provider, MD  Multiple Vitamin (MULTIVITAMIN WITH MINERALS) TABS tablet Take 1 tablet by mouth daily.   Yes Historical Provider, MD  Multiple Vitamins-Minerals (HAIR/SKIN/NAILS PO) Take 1 tablet by mouth daily.   Yes Historical Provider, MD  omeprazole (PRILOSEC) 20 MG capsule Take 1 capsule by mouth daily. 10/18/14  Yes Historical Provider, MD  oxyCODONE (OXY IR/ROXICODONE) 5 MG immediate release tablet Take 5 mg by mouth 2 (two) times daily as needed for moderate pain or severe pain.  08/20/15  Yes Historical Provider, MD  simvastatin (ZOCOR) 40 MG tablet Take 1 tablet by mouth daily. 07/18/15  Yes Historical Provider, MD  vitamin C (ASCORBIC ACID) 500 MG tablet Take 500 mg by mouth  daily.   Yes Historical Provider, MD  ziprasidone (GEODON) 60 MG capsule Take 1 capsule by mouth 2 (two) times daily. 10/08/14  Yes Historical Provider, MD  albuterol (PROVENTIL) (2.5 MG/3ML) 0.083% nebulizer solution Take 3 mLs (2.5 mg total) by nebulization every 6 (six) hours as needed for wheezing. 08/26/15   Oval Linsey, MD  Albuterol Sulfate (PROVENTIL HFA IN) Inhale 1-2 puffs into the lungs as needed (shorntess of breath).     Historical Provider, MD  predniSONE (DELTASONE) 20 MG tablet 2 tabs P.O. Daily for 7 days then discontinue 08/26/15   Oval Linsey, MD  sodium chloride (OCEAN) 0.65 % SOLN nasal spray Place 1 spray into both nostrils as needed for congestion.    Historical Provider, MD   BP 120/79 mmHg  Pulse 94  Temp(Src) 98.2 F (36.8 C) (Oral)  Resp 18  Ht  (1.702 m)  Wt 152 lb (68.947 kg)  BMI 23.80 kg/m2  SpO2 91% Physical Exam  Constitutional: She appears well-developed and well-nourished.  HENT:  Head: Atraumatic.  Neck: Neck supple.  Cardiovascular:  Tachycardia  Pulmonary/Chest:  Diffuse wheezes and prolonged expirations mild respiratory distress  Abdominal: Soft. She exhibits no distension.  Musculoskeletal: She exhibits edema.  Mild bilateral lower extremity pitting edema.  Neurological: She is alert.  Skin: Skin is warm.    ED Course  Procedures (including critical care time) Labs Review Labs Reviewed  BASIC METABOLIC PANEL - Abnormal; Notable for the following:    Potassium 3.3 (*)    Glucose, Bld 132 (*)    BUN 5 (*)    Calcium 8.5 (*)    All other components within normal limits  CBC WITH DIFFERENTIAL/PLATELET - Abnormal; Notable for the following:    Hemoglobin 10.8 (*)    HCT 35.2 (*)    Neutro Abs 8.9 (*)    All other components within normal limits  BASIC METABOLIC PANEL - Abnormal; Notable for the following:    Glucose, Bld 165 (*)    All other components within normal limits  INFLUENZA PANEL BY PCR (TYPE A & B, H1N1)     Imaging Review No results found. I have personally reviewed and evaluated these images and lab results as part of my medical decision-making.   EKG Interpretation   Date/Time:  Friday August 23 2015 18:19:39 EST Ventricular Rate:  122 PR Interval:  139 QRS Duration: 99 QT Interval:  333 QTC Calculation: 474 R Axis:   64 Text Interpretation:  Sinus tachycardia Ventricular premature complex  Aberrant conduction of SV complex(es) Confirmed by Rubin Payor  MD, Harrold Donath  [phone]) on 08/23/2015 6:30:56 PM      MDM   Final diagnoses:  COPD with asthma (HCC)  URI (upper respiratory infection)    Patient with shortness of breath. Hypoxic  Continue to be tachycardic. History of COPD and likely has URI on top of it. Will admit to internal medicine.    Benjiman Core, MD 08/26/15 [phone]

## 2015-08-23 NOTE — ED Notes (Signed)
EDP at bedside  

## 2015-08-24 LAB — BASIC METABOLIC PANEL
Anion gap: 10 (ref 5–15)
BUN: 8 mg/dL (ref 6–20)
CHLORIDE: 104 mmol/L (ref 101–111)
CO2: 25 mmol/L (ref 22–32)
CREATININE: 0.63 mg/dL (ref 0.44–1.00)
Calcium: 9.2 mg/dL (ref 8.9–10.3)
GLUCOSE: 165 mg/dL — AB (ref 65–99)
POTASSIUM: 3.9 mmol/L (ref 3.5–5.1)
SODIUM: 139 mmol/L (ref 135–145)

## 2015-08-24 MED ORDER — ENSURE ENLIVE PO LIQD
237.0000 mL | Freq: Two times a day (BID) | ORAL | Status: DC
  Administered 2015-08-24 – 2015-08-26 (×5): 237 mL via ORAL

## 2015-08-24 MED ORDER — CETYLPYRIDINIUM CHLORIDE 0.05 % MT LIQD
7.0000 mL | Freq: Two times a day (BID) | OROMUCOSAL | Status: DC
  Administered 2015-08-24 – 2015-08-26 (×5): 7 mL via OROMUCOSAL

## 2015-08-24 MED ORDER — POTASSIUM CHLORIDE CRYS ER 20 MEQ PO TBCR
20.0000 meq | EXTENDED_RELEASE_TABLET | Freq: Every day | ORAL | Status: AC
  Administered 2015-08-25 – 2015-08-26 (×2): 20 meq via ORAL
  Filled 2015-08-24 (×2): qty 1

## 2015-08-24 MED ORDER — METHYLPREDNISOLONE SODIUM SUCC 125 MG IJ SOLR
125.0000 mg | Freq: Four times a day (QID) | INTRAMUSCULAR | Status: DC
  Administered 2015-08-24 – 2015-08-25 (×5): 125 mg via INTRAVENOUS
  Filled 2015-08-24 (×5): qty 2

## 2015-08-24 MED ORDER — IPRATROPIUM-ALBUTEROL 0.5-2.5 (3) MG/3ML IN SOLN
3.0000 mL | Freq: Four times a day (QID) | RESPIRATORY_TRACT | Status: DC
  Administered 2015-08-24 – 2015-08-26 (×9): 3 mL via RESPIRATORY_TRACT
  Filled 2015-08-24 (×10): qty 3

## 2015-08-24 MED ORDER — ZIPRASIDONE HCL 60 MG PO CAPS
ORAL_CAPSULE | ORAL | Status: AC
  Filled 2015-08-24: qty 1

## 2015-08-24 NOTE — Progress Notes (Signed)
No evidence of infiltrate possible atelectasis per chest x-ray. She admitted with acute exacerbation of COPD she is a nonsmoker for 5 years laced on Solu-Medrol nebulizers and empiric Zithromax due to purulent coughing lungs reveal moderate intrauterine respiratory wheezes today we will increase dosage of Solu-Medrol 125 every 6 hours Kristen Ryan ZOX:[id number] DOB: 09-28-1955 DOA: 08/23/2015 PCP: Isabella Stalling, MD             Physical Exam: Blood pressure 126/76, pulse 89, temperature 98.6 F (37 C), temperature source Oral, resp. rate 20, height  (1.702 m), weight 152 lb (68.947 kg), SpO2 98 %. lungs moderate inspiratory wheezes scattered rhonchi bilaterally all lung fields no rales audible heart regular rhythm no S3 or S4 no heaves thrills rubs abdomen soft nontender bowel sounds normoactive  Investigations:  No results found for this or any previous visit (from the past 240 hour(s)).   Basic Metabolic Panel:  Recent Labs  81/19/14 1835  NA 138  K 3.3*  CL 101  CO2 26  GLUCOSE 132*  BUN 5*  CREATININE 0.66  CALCIUM 8.5*   Liver Function Tests: No results for input(s): AST, ALT, ALKPHOS, BILITOT, PROT, ALBUMIN in the last 72 hours.   CBC:  Recent Labs  08/23/15 1835  WBC 10.2  NEUTROABS 8.9*  HGB 10.8*  HCT 35.2*  MCV 87.1  PLT 270    Dg Chest 2 View  08/23/2015  CLINICAL DATA:  Dyspnea. EXAM: CHEST  2 VIEW COMPARISON:  11/06/2014. FINDINGS: Normal sized heart. Mildly increased linear density at the left lung base. Otherwise, clear lungs. The lungs remain mildly hyperexpanded with mildly prominent interstitial markings. Diffuse osteopenia. Stable mid thoracic vertebral compression deformity. IMPRESSION: 1. Mild increase in linear atelectasis or scarring at the left lung base. 2. Stable mild changes of COPD. Electronically Signed   By: Beckie Salts M.D.   On: 08/23/2015 19:19      Medications:   Impression:  Active Problems:   COPD  (chronic obstructive pulmonary disease) with acute bronchitis (HCC)   Acute respiratory failure with hypoxia (HCC)     Plan: Increase Solu-Medrol 125 IV every 6 hours KCl 20 mEq by mouth today  Consultants:    Procedures   Antibiotics: Zithromax                  Code Status: Family Communication:    Disposition Plan see plan above  Time spent: 30 minutes   LOS: 1 day   Kristen Ryan M   08/24/2015, 7:15 AM

## 2015-08-24 NOTE — Progress Notes (Signed)
Initial Nutrition Assessment  DOCUMENTATION CODES:  Not applicable  INTERVENTION:  Ensure Enlive po BID, each supplement provides 350 kcal and 20 grams of protein  NUTRITION DIAGNOSIS:  Increased nutrient needs related to chronic illness (COPD) as evidenced by apparent loss of >5% bw in the last month  GOAL:  Patient will meet greater than or equal to 90% of their needs  MONITOR:  PO intake, Supplement acceptance  REASON FOR ASSESSMENT:  Consult Assessment of nutrition requirement/status  ASSESSMENT:  60 y/o female PMHx COPD, asthma, bipolar type I, hypothyroidism, anemia,  IBS who reports 2.5 weeks SOB and intermittent fevers that started 3-4 days ago. She was hospitalized at the end of January for diverticulitis. Admitted/treated for COPD exacerbation  RD operating remotely. Per notes,  Pt denied any n/v/c/d, abdominal pain.   She ate 100% of her meal today.   Though pt has denied any GI symptoms that may hamper her intake, she appears to have lost >5% of her bw in <1 month. Could be related to increased energy requirements in setting if COPD/acute resp failure and simply not having adequate intake to support these.   NFPE: Unable to assess.   Labs reviewed: Hyperglycemia.   Diet Order:  Diet Heart Room service appropriate?: Yes; Fluid consistency:: Thin  Skin:  Reviewed, no issues  Last BM:  2/18  Height:  Ht Readings from Last 1 Encounters:  08/23/15  (1.702 m)   Weight:  Wt Readings from Last 1 Encounters:  08/23/15 152 lb (68.947 kg)   Wt Readings from Last 10 Encounters:  08/23/15 152 lb (68.947 kg)  08/06/15 164 lb 3.2 oz (74.481 kg)  07/06/15 161 lb (73.029 kg)  06/12/15 159 lb (72.122 kg)  05/18/15 150 lb (68.04 kg)  04/05/15 162 lb (73.483 kg)  03/10/15 157 lb (71.215 kg)  02/14/15 157 lb 3.2 oz (71.305 kg)  11/06/14 245 lb (111.131 kg)  09/16/14 150 lb (68.04 kg)   Ideal Body Weight:  61.36 kg  BMI:  Body mass index is 23.8  kg/(m^2).  Estimated Nutritional Needs:  Kcal:  1750-1950 kcal (25-28 kcal/kg bw) Protein:  69-83 g pro (1-1.2 g/kg bw) Fluid:  1.8-2 liters of fluid  EDUCATION NEEDS:  No education needs identified at this time  Christophe Louis RD, LDN Nutrition Pager: [phone] 08/24/2015 11:03 AM

## 2015-08-25 MED ORDER — METHYLPREDNISOLONE SODIUM SUCC 125 MG IJ SOLR
60.0000 mg | Freq: Four times a day (QID) | INTRAMUSCULAR | Status: DC
  Administered 2015-08-25 – 2015-08-26 (×4): 60 mg via INTRAVENOUS
  Filled 2015-08-25 (×4): qty 2

## 2015-08-25 NOTE — Progress Notes (Signed)
Significant improvement present and bronchospasm with the addition of steroids will decrease to 80 mg IV every 6 hours continue nebulizer therapy ADLEY CASTELLO ZOX:[id number] DOB: 09-28-1955 DOA: 08/23/2015 PCP: Isabella Stalling, MD             Physical Exam: Blood pressure 123/82, pulse 101, temperature 98.2 F (36.8 C), temperature source Oral, resp. rate 18, height  (1.702 m), weight 152 lb (68.947 kg), SpO2 95 %. nectar JVD no carotid bruit no thyromegaly lungs show minimal to moderate inspiratory wheezes much improved scattered rhonchi no rales appreciable heart regular rhythm no murmurs gallops heaves thrills rubs   Investigations:  No results found for this or any previous visit (from the past 240 hour(s)).   Basic Metabolic Panel:  Recent Labs  81/19/14 1835 08/24/15 0725  NA 138 139  K 3.3* 3.9  CL 101 104  CO2 26 25  GLUCOSE 132* 165*  BUN 5* 8  CREATININE 0.66 0.63  CALCIUM 8.5* 9.2   Liver Function Tests: No results for input(s): AST, ALT, ALKPHOS, BILITOT, PROT, ALBUMIN in the last 72 hours.   CBC:  Recent Labs  08/23/15 1835  WBC 10.2  NEUTROABS 8.9*  HGB 10.8*  HCT 35.2*  MCV 87.1  PLT 270    Dg Chest 2 View  08/23/2015  CLINICAL DATA:  Dyspnea. EXAM: CHEST  2 VIEW COMPARISON:  11/06/2014. FINDINGS: Normal sized heart. Mildly increased linear density at the left lung base. Otherwise, clear lungs. The lungs remain mildly hyperexpanded with mildly prominent interstitial markings. Diffuse osteopenia. Stable mid thoracic vertebral compression deformity. IMPRESSION: 1. Mild increase in linear atelectasis or scarring at the left lung base. 2. Stable mild changes of COPD. Electronically Signed   By: Beckie Salts M.D.   On: 08/23/2015 19:19      Medications:   Impression:  Active Problems:   COPD (chronic obstructive pulmonary disease) with acute bronchitis (HCC)   Acute respiratory failure with hypoxia (HCC)     Plan: Decrease  Solu-Medrol to 80 IV every 6 hours to new current therapy  Consultants:    Procedures   Antibiotics: Zithromax                  Code Status: Full  Family Communication:  [place] with patient  Disposition Plan see plan above  Time spent: 30 minutes   LOS: 2 days   Tasheema Perrone M   08/25/2015, 11:08 AM

## 2015-08-26 DIAGNOSIS — J440 Chronic obstructive pulmonary disease with acute lower respiratory infection: Secondary | ICD-10-CM | POA: Diagnosis not present

## 2015-08-26 MED ORDER — PREDNISONE 20 MG PO TABS: ORAL_TABLET | ORAL | Status: DC

## 2015-08-26 MED ORDER — INFLUENZA VAC SPLIT QUAD 0.5 ML IM SUSY: 0.5000 mL | PREFILLED_SYRINGE | INTRAMUSCULAR | Status: DC

## 2015-08-26 MED ORDER — INFLUENZA VAC SPLIT QUAD 0.5 ML IM SUSY
0.5000 mL | PREFILLED_SYRINGE | INTRAMUSCULAR | Status: AC
  Administered 2015-08-26: 0.5 mL via INTRAMUSCULAR

## 2015-08-26 MED ORDER — ALBUTEROL SULFATE (2.5 MG/3ML) 0.083% IN NEBU: 2.5000 mg | INHALATION_SOLUTION | Freq: Four times a day (QID) | RESPIRATORY_TRACT | Status: DC | PRN

## 2015-08-26 NOTE — Care Management Note (Signed)
Case Management Note  Patient Details  Name: Kristen Ryan MRN: [id number] Date of Birth: 07/11/1955  Subjective/Objective:                  Pt admitted for COPD exacerbation. Pt is from home, lives with husband and is ind with ADL's. Pt has neb machine at home prior to admission but no other DME. PT has evaluated pt and recommended RW. Pt has chosen [facility] as DME agency of choice. Pt does not meet requirements for home O2.   Action/Plan: Alroy Bailiff, of [facility], made aware of referral and will obtain pt info from chart and deliver RW to pt's room prior to DC. No further CM needs identified at this time.   Expected Discharge Date:  08/26/15               Expected Discharge Plan:  Home/Self Care  In-House Referral:  NA  Discharge planning Services  CM Consult  Post Acute Care Choice:  Durable Medical Equipment Choice offered to:  Patient  DME Arranged:  Dan [facility]re[facility] rolling DME Agency:  [facility]  HH Arranged:    [facility] Agency:     Status of Service:  Completed, signed off  Medicare Important Message Given:    Date Medicare IM Given:    Medicare IM give by:    Date Additional Medicare IM Given:    Additional Medicare Important Message give by:     If discussed at Long Length of Stay Meetings, dates discussed:    Additional Comments:  Malcolm Metro, RN 08/26/2015, 2:01 PM

## 2015-08-26 NOTE — Progress Notes (Signed)
OT Screen Note  Patient Details Name: Kristen Ryan MRN: [id number] DOB: Jun 13, 1956   Cancelled Treatment:    Reason Eval/Treat Not Completed: OT screened, no needs identified, will sign off   Limmie Patricia, OTR/L,CBIS  [phone]  08/26/2015, 11:00 AM

## 2015-08-26 NOTE — Progress Notes (Signed)
Pt discharged with prescriptions, IV removed and intact. Pt had all personal belongings.

## 2015-08-26 NOTE — Discharge Summary (Signed)
Physician Discharge Summary  Kristen Ryan:[id number] DOB: 02-22-1956 DOA: 08/23/2015  PCP: Isabella Stalling, MD  Admit date: 08/23/2015 Discharge date: 08/26/2015   Recommendations for Outpatient Follow-up:  Patient is advised to use her albuterol nebulizer 4 times a day every day to take prednisone 20 mg 2 tablets by mouth daily for 7 days and follow-up my office in 4-5 days time to assess bronchospasm as well as anxiety Discharge Diagnoses:  Active Problems:   COPD (chronic obstructive pulmonary disease) with acute bronchitis (HCC)   Acute respiratory failure with hypoxia [facility])   Discharge Condition: Good and improving  Filed Weights   08/23/15 1823  Weight: 152 lb (68.947 kg)    History of present illness:  Patient with a 15-pack-year history of smoking has not smoked in 5 years was admitted with acute bronchospastic disorder acute bronchitis significant bronchospasm which responded to intravenous steroids and nebulizer therapy of albuterol she had linear atelectasis on chest x-ray and was just empirically started on Zithromax for a little mucopurulent sputum she continue to improve over for 5 day period and was subsequently decreased her Solu-Medrol 125 down to 60 every 6H and discharged on prednisone 40 mg per day for 7 days she is advised to take all other medicines not to be near aerosol sprays or cigarettes and to follow-up my office in 3-5 days' time she understands this  Hospital Course:  See history of present illness  Procedures:    Consultations:    Discharge Instructions  Discharge Instructions    Discharge instructions    Complete by:  As directed      Discharge patient    Complete by:  As directed             Medication List    STOP taking these medications        HAIR/SKIN/NAILS PO     sodium chloride 0.65 % Soln nasal spray  Commonly known as:  OCEAN     vitamin C 500 MG tablet  Commonly known as:  ASCORBIC ACID      TAKE  these medications        ALPRAZolam 1 MG tablet  Commonly known as:  XANAX  Take 1 mg by mouth 3 (three) times daily as needed for sleep or anxiety.     aspirin 325 MG tablet  Take 325 mg by mouth daily.     budesonide-formoterol 160-4.5 MCG/ACT inhaler  Commonly known as:  SYMBICORT  Inhale 2 puffs into the lungs 2 (two) times daily.     dicyclomine 10 MG capsule  Commonly known as:  BENTYL  TAKE 1 CAPSULE BY MOUTH THREE TIMES DAILY AS NEEDED FOR SPASMS.     levothyroxine 50 MCG tablet  Commonly known as:  SYNTHROID, LEVOTHROID  Take 50 mcg by mouth daily.     multivitamin with minerals Tabs tablet  Take 1 tablet by mouth daily.     omeprazole 20 MG capsule  Commonly known as:  PRILOSEC  Take 1 capsule by mouth daily.     oxyCODONE 5 MG immediate release tablet  Commonly known as:  Oxy IR/ROXICODONE  Take 5 mg by mouth 2 (two) times daily as needed for moderate pain or severe pain.     predniSONE 20 MG tablet  Commonly known as:  DELTASONE  2 tabs P.O. Daily for 7 days then discontinue     PROVENTIL HFA IN  Inhale 1-2 puffs into the lungs as needed (shorntess of breath).  albuterol (2.5 MG/3ML) 0.083% nebulizer solution  Commonly known as:  PROVENTIL  Take 3 mLs (2.5 mg total) by nebulization every 6 (six) hours as needed for wheezing.     simvastatin 40 MG tablet  Commonly known as:  ZOCOR  Take 1 tablet by mouth daily.     ziprasidone 60 MG capsule  Commonly known as:  GEODON  Take 1 capsule by mouth 2 (two) times daily.       Allergies  Allergen Reactions  . Aminophylline Anaphylaxis  . Sumatriptan Anaphylaxis and Other (See Comments)    Causes fainting  . Theophylline Anaphylaxis  . Codeine Itching and Other (See Comments)    Too strong for patient   . Depakote [Divalproex Sodium] Other (See Comments)    Hair loss  . Divalproex Sodium Other (See Comments)    Causes hair to fall out  . Doxycycline Other (See Comments)    headache  . Lamictal  [Lamotrigine]     Destroys thyroid  . Magnesium-Containing Compounds Hives  . Naproxen Swelling  . Other     Hair dye  . Penicillins Nausea And Vomiting    Has patient had a PCN reaction causing immediate rash, facial/tongue/throat swelling, SOB or lightheadedness with hypotension: Yes Has patient had a PCN reaction causing severe rash involving mucus membranes or skin necrosis: No Has patient had a PCN reaction that required hospitalization Yes Has patient had a PCN reaction occurring within the last 10 years: No If all of the above answers are "NO", then may proceed with Cephalosporin use.   Marland Kitchen Pentazocine Lactate Other (See Comments)    Unknown reaction  . Risperidone Other (See Comments)    Insomnia   . Seroquel [Quetiapine] Swelling  . Tegretol [Carbamazepine] Swelling  . Tetracyclines & Related   . Vistaril [Hydroxyzine Hcl] Nausea And Vomiting  . Tetracycline Rash    Headaches  . Zyprexa [Olanzapine] Rash and Other (See Comments)    Insomnia      The results of significant diagnostics from this hospitalization (including imaging, microbiology, ancillary and laboratory) are listed below for reference.    Significant Diagnostic Studies: Dg Chest 2 View  08/23/2015  CLINICAL DATA:  Dyspnea. EXAM: CHEST  2 VIEW COMPARISON:  11/06/2014. FINDINGS: Normal sized heart. Mildly increased linear density at the left lung base. Otherwise, clear lungs. The lungs remain mildly hyperexpanded with mildly prominent interstitial markings. Diffuse osteopenia. Stable mid thoracic vertebral compression deformity. IMPRESSION: 1. Mild increase in linear atelectasis or scarring at the left lung base. 2. Stable mild changes of COPD. Electronically Signed   By: Beckie Salts M.D.   On: 08/23/2015 19:19   Ct Abdomen Pelvis W Contrast  08/03/2015  CLINICAL DATA:  Lower abdominal pain and fevers EXAM: CT ABDOMEN AND PELVIS WITH CONTRAST TECHNIQUE: Multidetector CT imaging of the abdomen and pelvis was  performed using the standard protocol following bolus administration of intravenous contrast. CONTRAST:  25mL OMNIPAQUE IOHEXOL 300 MG/ML SOLN, OMNIPAQUE IOHEXOL 300 MG/ML SOLN COMPARISON:  None. FINDINGS: Lung bases are free of acute infiltrate or sizable effusion. The liver, gallbladder, spleen, adrenal glands and pancreas are within normal limits. Kidneys are well visualized bilaterally and demonstrate a normal enhancement pattern. A 1 cm cyst is noted in the upper pole of the left kidney. No calculi or obstructive changes are noted. The bladder is well distended. The appendix is barium filled and within normal limits. Diverticular change of the colon is noted particularly in the sigmoid colon with areas  of inflammatory change consistent with diverticulitis. There is a focally enlarged tick best seen on images 64-70 of series 2 with inflammatory changes. No definitive perforation or abscess formation is seen. No free pelvic fluid is noted. No bony abnormality is seen. The uterus has been surgically removed. No acute bony abnormality is seen. IMPRESSION: Changes consistent with sigmoid diverticulitis without evidence of perforation or abscess formation. Electronically Signed   By: Alcide Clever M.D.   On: 08/03/2015 13:29    Microbiology: No results found for this or any previous visit (from the past 240 hour(s)).   Labs: Basic Metabolic Panel:  Recent Labs Lab 08/23/15 1835 08/24/15 0725  NA 138 139  K 3.3* 3.9  CL 101 104  CO2 26 25  GLUCOSE 132* 165*  BUN 5* 8  CREATININE 0.66 0.63  CALCIUM 8.5* 9.2   Liver Function Tests: No results for input(s): AST, ALT, ALKPHOS, BILITOT, PROT, ALBUMIN in the last 168 hours. No results for input(s): LIPASE, AMYLASE in the last 168 hours. No results for input(s): AMMONIA in the last 168 hours. CBC:  Recent Labs Lab 08/23/15 1835  WBC 10.2  NEUTROABS 8.9*  HGB 10.8*  HCT 35.2*  MCV 87.1  PLT 270   Cardiac Enzymes: No results for  input(s): CKTOTAL, CKMB, CKMBINDEX, TROPONINI in the last 168 hours. BNP: BNP (last 3 results) No results for input(s): BNP in the last 8760 hours.  ProBNP (last 3 results) No results for input(s): PROBNP in the last 8760 hours.  CBG: No results for input(s): GLUCAP in the last 168 hours.     Signed:  Jestina Stephani Judie Petit  [facility] Pager: [phone] 08/26/2015, 1:39 PM

## 2015-08-26 NOTE — Evaluation (Signed)
Physical Therapy Evaluation Patient Details Name: Kristen Ryan MRN: [id number] DOB: [birth year] Today's Date: 08/26/2015   History of Present Illness  Pt is a 60 yo F admitted to [facility] with SOB. PMH includes COPD, asthma, bipolar, IBS, and hypothyroidism.  Clinical Impression  Pt was found resting in her hospital bed, receiving 3L O2 via nasal canula. She was willing to participate with therapy. She denies pain and notes she has been getting up and going to the restroom indep without any difficulty. She demonstrates indep with all bed mobility, transfers and ambulation without AD. At baseline, she reports using a [facility] for community ambulation and some household ambulation. When not using her AD, she uses furniture and walls for balance. During the session, she demonstrated fair+ balance during ambulation without AD, which improved significantly with RW. She would benefit from a RW for home to improve her compliance and support during ambulation throughout the day. At this time, she is performing at baseline function and feels she is safe to return home. PT will d/c secondary to pt indep and modified indep with all activity.     Follow Up Recommendations No PT follow up    Equipment Recommendations  Rolling walker with 5" wheels (For improved compliance and safety with community/household ambulation)    Recommendations for Other Services       Precautions / Restrictions        Mobility  Bed Mobility Overal bed mobility: Independent                Transfers Overall transfer level: Independent               General transfer comment: Reports short distances in her room without AD, however feels she would be more steady with RW  Ambulation/Gait Ambulation/Gait assistance: Modified independent (Device/Increase time) Ambulation Distance (Feet): 200 Feet Assistive device: Rolling walker (2 wheeled) Gait Pattern/deviations: WFL(Within Functional Limits)   Gait velocity  interpretation: at or above normal speed for age/gender General Gait Details: Pt able to change speeds when directed by therapist. Feels she is performing at her baseline strength and function and is safe to return home.   Stairs            Wheelchair Mobility    Modified Rankin (Stroke Patients Only)       Balance Overall balance assessment: Modified Independent Sitting-balance support: No upper extremity supported Sitting balance-Leahy Scale: Normal     Standing balance support: No upper extremity supported Standing balance-Leahy Scale: Good Standing balance comment: Standing balance is good without AD; Dynamic balance is fair without AD and improved to good with RW     Tandem Stance - Right Leg: 30 Tandem Stance - Left Leg: 30 Rhomberg - Eyes Opened: 30 Rhomberg - Eyes Closed: 30   High Level Balance Comments: Standing functional reach >7in indicating decreased risk of falls              Pertinent Vitals/Pain Pain Assessment: No/denies pain    Home Living Family/patient expects to be discharged to:: Private residence Living Arrangements: Spouse/significant other;Other (Comment) (2 roommates) Available Help at Discharge: Family;Friend(s) Type of Home: House Home Access: Stairs to enter Entrance Stairs-Rails: Can reach both Entrance Stairs-Number of Steps: 1 step into back door; 3 STE front Home Layout: One level Home Equipment: Cane - single point;Grab bars - tub/shower      Prior Function Level of Independence: Independent with assistive device(s)         Comments: Reports using  [facility] for community and some household ambulation     Hand Dominance        Extremity/Trunk Assessment   Upper Extremity Assessment: Overall WFL for tasks assessed           Lower Extremity Assessment: Overall WFL for tasks assessed         Communication   Communication: No difficulties  Cognition Arousal/Alertness: Awake/alert Behavior During Therapy: WFL  for tasks assessed/performed Overall Cognitive Status: Within Functional Limits for tasks assessed                      General Comments      Exercises General Exercises - Lower Extremity Long Arc Quad: AROM;10 reps Hip ABduction/ADduction: AROM;10 reps Hip Flexion/Marching: AROM;10 reps Toe Raises: AROM;10 reps Heel Raises: AROM;10 reps      Assessment/Plan    PT Assessment Patent does not need any further PT services  PT Diagnosis     PT Problem List    PT Treatment Interventions     PT Goals (Current goals can be found in the Care Plan section)      Frequency     Barriers to discharge        Co-evaluation               End of Session Equipment Utilized During Treatment: Gait belt Activity Tolerance: Patient tolerated treatment well;No increased pain Patient left: in chair;with call bell/phone within reach Nurse Communication: Mobility status;Other (comment) (Discussed pt's SaO2 during the session )         Time: 0900-0930 PT Time Calculation (min) (ACUTE ONLY): 30 min   Charges:   PT Evaluation $PT Eval Low Complexity: 1 Procedure PT Treatments $Therapeutic Activity: 23-37 mins   PT G Codes:           10:38 AM,September 01, 2015 Marylyn Ishihara PT, DPT [facility] [phone]

## 2015-08-26 NOTE — Care Management Important Message (Signed)
Important Message  Patient Details  Name: Kristen Ryan MRN: [id number] Date of Birth: 24-Nov-1955   Medicare Important Message Given:  Yes    Malcolm Metro, RN 08/26/2015, 2:03 PM

## 2015-09-05 ENCOUNTER — Other Ambulatory Visit (INDEPENDENT_AMBULATORY_CARE_PROVIDER_SITE_OTHER): Payer: Self-pay | Admitting: Internal Medicine

## 2015-10-14 ENCOUNTER — Other Ambulatory Visit (INDEPENDENT_AMBULATORY_CARE_PROVIDER_SITE_OTHER): Payer: Self-pay | Admitting: Internal Medicine

## 2015-12-03 ENCOUNTER — Other Ambulatory Visit (INDEPENDENT_AMBULATORY_CARE_PROVIDER_SITE_OTHER): Payer: Self-pay | Admitting: Internal Medicine

## 2016-01-23 ENCOUNTER — Other Ambulatory Visit (INDEPENDENT_AMBULATORY_CARE_PROVIDER_SITE_OTHER): Payer: Self-pay | Admitting: Internal Medicine

## 2016-02-24 ENCOUNTER — Emergency Department (HOSPITAL_COMMUNITY)
Admission: EM | Admit: 2016-02-24 | Discharge: 2016-02-24 | Disposition: A | Discharge status: Home / Self Care | Payer: Medicare Other | Attending: Dermatology | Admitting: Dermatology

## 2016-02-24 ENCOUNTER — Encounter (HOSPITAL_COMMUNITY): Payer: Self-pay | Admitting: Emergency Medicine

## 2016-02-24 DIAGNOSIS — Z7982 Long term (current) use of aspirin: Secondary | ICD-10-CM | POA: Diagnosis not present

## 2016-02-24 DIAGNOSIS — J449 Chronic obstructive pulmonary disease, unspecified: Secondary | ICD-10-CM | POA: Diagnosis not present

## 2016-02-24 DIAGNOSIS — J45909 Unspecified asthma, uncomplicated: Secondary | ICD-10-CM | POA: Insufficient documentation

## 2016-02-24 DIAGNOSIS — R1030 Lower abdominal pain, unspecified: Secondary | ICD-10-CM | POA: Diagnosis not present

## 2016-02-24 DIAGNOSIS — Z87891 Personal history of nicotine dependence: Secondary | ICD-10-CM | POA: Insufficient documentation

## 2016-02-24 DIAGNOSIS — Z5321 Procedure and treatment not carried out due to patient leaving prior to being seen by health care provider: Secondary | ICD-10-CM | POA: Diagnosis not present

## 2016-02-24 DIAGNOSIS — Z79899 Other long term (current) drug therapy: Secondary | ICD-10-CM | POA: Insufficient documentation

## 2016-02-24 HISTORY — DX: Diverticulitis of intestine, part unspecified, without perforation or abscess without bleeding: K57.92

## 2016-02-24 LAB — COMPREHENSIVE METABOLIC PANEL
ALT: 34 U/L (ref 14–54)
AST: 26 U/L (ref 15–41)
Albumin: 3.6 g/dL (ref 3.5–5.0)
Alkaline Phosphatase: 61 U/L (ref 38–126)
Anion gap: 9 (ref 5–15)
BUN: 12 mg/dL (ref 6–20)
CHLORIDE: 101 mmol/L (ref 101–111)
CO2: 29 mmol/L (ref 22–32)
CREATININE: 0.93 mg/dL (ref 0.44–1.00)
Calcium: 9 mg/dL (ref 8.9–10.3)
Glucose, Bld: 94 mg/dL (ref 65–99)
POTASSIUM: 3.9 mmol/L (ref 3.5–5.1)
SODIUM: 139 mmol/L (ref 135–145)
Total Bilirubin: 0.2 mg/dL — ABNORMAL LOW (ref 0.3–1.2)
Total Protein: 6.1 g/dL — ABNORMAL LOW (ref 6.5–8.1)

## 2016-02-24 LAB — CBC
HEMATOCRIT: 37.8 % (ref 36.0–46.0)
Hemoglobin: 11.8 g/dL — ABNORMAL LOW (ref 12.0–15.0)
MCH: 28.4 pg (ref 26.0–34.0)
MCHC: 31.2 g/dL (ref 30.0–36.0)
MCV: 90.9 fL (ref 78.0–100.0)
PLATELETS: 161 10*3/uL (ref 150–400)
RBC: 4.16 MIL/uL (ref 3.87–5.11)
RDW: 14.8 % (ref 11.5–15.5)
WBC: 8.4 10*3/uL (ref 4.0–10.5)

## 2016-02-24 LAB — LIPASE, BLOOD: LIPASE: 28 U/L (ref 11–51)

## 2016-02-24 NOTE — ED Notes (Signed)
Per registration pt left facility. 

## 2016-02-24 NOTE — ED Triage Notes (Signed)
Pt c/o lower abd cramping since last night. Pt denies any n/v/d.

## 2016-03-01 ENCOUNTER — Emergency Department (HOSPITAL_COMMUNITY): Payer: Medicare Other

## 2016-03-01 ENCOUNTER — Emergency Department (HOSPITAL_COMMUNITY)
Admission: EM | Admit: 2016-03-01 | Discharge: 2016-03-01 | Disposition: A | Discharge status: Home / Self Care | Payer: Medicare Other | Attending: Emergency Medicine | Admitting: Emergency Medicine

## 2016-03-01 ENCOUNTER — Encounter (HOSPITAL_COMMUNITY): Payer: Self-pay | Admitting: Emergency Medicine

## 2016-03-01 DIAGNOSIS — K5732 Diverticulitis of large intestine without perforation or abscess without bleeding: Secondary | ICD-10-CM | POA: Insufficient documentation

## 2016-03-01 DIAGNOSIS — Z7982 Long term (current) use of aspirin: Secondary | ICD-10-CM | POA: Diagnosis not present

## 2016-03-01 DIAGNOSIS — J45909 Unspecified asthma, uncomplicated: Secondary | ICD-10-CM | POA: Insufficient documentation

## 2016-03-01 DIAGNOSIS — R1030 Lower abdominal pain, unspecified: Secondary | ICD-10-CM | POA: Diagnosis present

## 2016-03-01 DIAGNOSIS — J449 Chronic obstructive pulmonary disease, unspecified: Secondary | ICD-10-CM | POA: Insufficient documentation

## 2016-03-01 DIAGNOSIS — Z79899 Other long term (current) drug therapy: Secondary | ICD-10-CM | POA: Diagnosis not present

## 2016-03-01 DIAGNOSIS — Z87891 Personal history of nicotine dependence: Secondary | ICD-10-CM | POA: Diagnosis not present

## 2016-03-01 LAB — URINALYSIS, ROUTINE W REFLEX MICROSCOPIC
Bilirubin Urine: NEGATIVE
Glucose, UA: NEGATIVE mg/dL
HGB URINE DIPSTICK: NEGATIVE
Ketones, ur: NEGATIVE mg/dL
LEUKOCYTES UA: NEGATIVE
NITRITE: NEGATIVE
PROTEIN: NEGATIVE mg/dL
pH: 6.5 (ref 5.0–8.0)

## 2016-03-01 LAB — CBC WITH DIFFERENTIAL/PLATELET
BASOS ABS: 0 10*3/uL (ref 0.0–0.1)
BASOS PCT: 1 %
EOS ABS: 0.2 10*3/uL (ref 0.0–0.7)
EOS PCT: 3 %
HCT: 37.9 % (ref 36.0–46.0)
HEMOGLOBIN: 11.9 g/dL — AB (ref 12.0–15.0)
Lymphocytes Relative: 20 %
Lymphs Abs: 1.3 10*3/uL (ref 0.7–4.0)
MCH: 28.1 pg (ref 26.0–34.0)
MCHC: 31.4 g/dL (ref 30.0–36.0)
MCV: 89.6 fL (ref 78.0–100.0)
Monocytes Absolute: 0.6 10*3/uL (ref 0.1–1.0)
Monocytes Relative: 9 %
NEUTROS PCT: 67 %
Neutro Abs: 4.3 10*3/uL (ref 1.7–7.7)
PLATELETS: 215 10*3/uL (ref 150–400)
RBC: 4.23 MIL/uL (ref 3.87–5.11)
RDW: 14.1 % (ref 11.5–15.5)
WBC: 6.4 10*3/uL (ref 4.0–10.5)

## 2016-03-01 LAB — BASIC METABOLIC PANEL
Anion gap: 9 (ref 5–15)
BUN: 13 mg/dL (ref 6–20)
CHLORIDE: 102 mmol/L (ref 101–111)
CO2: 29 mmol/L (ref 22–32)
CREATININE: 0.95 mg/dL (ref 0.44–1.00)
Calcium: 9.7 mg/dL (ref 8.9–10.3)
Glucose, Bld: 120 mg/dL — ABNORMAL HIGH (ref 65–99)
POTASSIUM: 3.4 mmol/L — AB (ref 3.5–5.1)
SODIUM: 140 mmol/L (ref 135–145)

## 2016-03-01 MED ORDER — DIATRIZOATE MEGLUMINE & SODIUM 66-10 % PO SOLN
ORAL | Status: AC
  Filled 2016-03-01: qty 30

## 2016-03-01 MED ORDER — CIPROFLOXACIN HCL 500 MG PO TABS: 500.0000 mg | ORAL_TABLET | Freq: Two times a day (BID) | ORAL | 0 refills | Status: DC

## 2016-03-01 MED ORDER — OXYCODONE-ACETAMINOPHEN 5-325 MG PO TABS: 1.0000 | ORAL_TABLET | Freq: Four times a day (QID) | ORAL | 0 refills | Status: DC | PRN

## 2016-03-01 MED ORDER — FENTANYL CITRATE (PF) 100 MCG/2ML IJ SOLN
50.0000 ug | Freq: Once | INTRAMUSCULAR | Status: AC
  Administered 2016-03-01: 50 ug via INTRAVENOUS
  Filled 2016-03-01: qty 2

## 2016-03-01 MED ORDER — IOPAMIDOL (ISOVUE-300) INJECTION 61%
100.0000 mL | Freq: Once | INTRAVENOUS | Status: AC | PRN
  Administered 2016-03-01: 100 mL via INTRAVENOUS

## 2016-03-01 MED ORDER — METRONIDAZOLE 500 MG PO TABS: 500.0000 mg | ORAL_TABLET | Freq: Three times a day (TID) | ORAL | 0 refills | Status: DC

## 2016-03-01 MED ORDER — PROMETHAZINE HCL 25 MG PO TABS: 25.0000 mg | ORAL_TABLET | Freq: Four times a day (QID) | ORAL | 0 refills | Status: DC | PRN

## 2016-03-01 MED ORDER — SODIUM CHLORIDE 0.9 % IV BOLUS (SEPSIS)
500.0000 mL | Freq: Once | INTRAVENOUS | Status: AC
Start: 2016-03-01 — End: 2016-03-01
  Administered 2016-03-01: 500 mL via INTRAVENOUS

## 2016-03-01 NOTE — ED Provider Notes (Signed)
[facility] Provider Note   CSN: [id number] Arrival date & time: 03/01/16  1339     History   Chief Complaint Chief Complaint  Patient presents with  . Abdominal Pain    HPI Kristen Ryan is a 60 y.o. female.  The history is provided by the patient.  Abdominal Pain   Associated symptoms include fever. Pertinent negatives include diarrhea, nausea, vomiting, dysuria and headaches.  Patient presents with abdominal pain. She's had for the last week. She states that time she is trying to get into her primary care doctor's office twice. States that she also came to the ER once but 5 tablets came in and she was not able to complete the visit. The pain is in her lower abdomen and somewhat sharp. States it feels like her previous diverticulitis. Last had it around 7 months ago. No nausea vomiting or diarrhea. She's had a decreased appetite. No dysuria but states when she has a bowel movement she feels as if her insides are all in a fall out. Pain is worse with movement. She's had a decreased appetite. Temperature up to 100.4 at home.  Past Medical History:  Diagnosis Date  . Anemia   . Asthma   . Bipolar 1 disorder (HCC)   . Bronchitis   . COPD (chronic obstructive pulmonary disease) (HCC)   . Diverticulitis   . IBS (irritable bowel syndrome)   . Neuropathy (HCC)   . Schizophrenia (HCC)   . Thyroid disease     Patient Active Problem List   Diagnosis Date Noted  . COPD (chronic obstructive pulmonary disease) with acute bronchitis (HCC) 08/23/2015  . Acute respiratory failure with hypoxia (HCC) 08/23/2015  . Sigmoid diverticulitis 08/03/2015  . Hypokalemia 08/03/2015  . Normocytic anemia 08/03/2015  . COPD with asthma (HCC)   . Thyroid activity decreased   . Bipolar disorder in partial remission (HCC)   . Bipolar disorder (HCC) 02/14/2015  . Midline thoracic back pain 08/21/2014  . Abnormality of gait 08/21/2014  . Urinary urgency 08/21/2014  . ASTHMA 03/27/2008  .  COLLES' FRACTURE, LEFT WRIST 03/27/2008    Past Surgical History:  Procedure Laterality Date  . ABDOMINAL HYSTERECTOMY    . broke left arm    . CESAREAN SECTION    . COLONOSCOPY N/A 08/30/2014   Procedure: COLONOSCOPY;  Surgeon: Malissa HippoNajeeb U Rehman, MD;  Location: [facility] SUITE;  Service: Endoscopy;  Laterality: N/A;  1200  . ELBOW SURGERY    . FOOT SURGERY    . KNEE SURGERY    . TONSILLECTOMY      OB History    Gravida Para Term Preterm AB Living   1 1       1    SAB TAB Ectopic Multiple Live Births                   Home Medications    Prior to Admission medications   Medication Sig Start Date End Date Taking? Authorizing Provider  albuterol (PROVENTIL) (2.5 MG/3ML) 0.083% nebulizer solution Take 3 mLs (2.5 mg total) by nebulization every 6 (six) hours as needed for wheezing. 08/26/15  Yes Oval Linseyichard Dondiego, MD  Albuterol Sulfate (PROVENTIL HFA IN) Inhale 1-2 puffs into the lungs as needed (shorntess of breath).    Yes Historical Provider, MD  aspirin 325 MG tablet Take 325 mg by mouth daily.   Yes Historical Provider, MD  budesonide-formoterol (SYMBICORT) 160-4.5 MCG/ACT inhaler Inhale 2 puffs into the lungs 2 (two) times daily. 03/25/14  Yes Lorre Nick, MD  clonazePAM (KLONOPIN) 1 MG tablet Take 1 mg by mouth 3 (three) times daily as needed for anxiety.  02/08/16  Yes Historical Provider, MD  dicyclomine (BENTYL) 10 MG capsule TAKE 1 CAPSULE BY MOUTH THREE TIMES DAILY AS NEEDED FOR SPASMS. 01/23/16  Yes Len Blalock, NP  levothyroxine (SYNTHROID, LEVOTHROID) 50 MCG tablet Take 50 mcg by mouth daily.   Yes Historical Provider, MD  MELATONIN PO Take 12 mg by mouth at bedtime.   Yes Historical Provider, MD  Multiple Vitamin (MULTIVITAMIN WITH MINERALS) TABS tablet Take 1 tablet by mouth daily.   Yes Historical Provider, MD  omeprazole (PRILOSEC) 20 MG capsule Take 1 capsule by mouth daily. 10/18/14  Yes Historical Provider, MD  oxyCODONE (OXY IR/ROXICODONE) 5 MG immediate release  tablet Take 5 mg by mouth 2 (two) times daily as needed for moderate pain or severe pain.  08/20/15  Yes Historical Provider, MD  predniSONE (DELTASONE) 20 MG tablet 2 tabs P.O. Daily for 7 days then discontinue 08/26/15  Yes Oval Linsey, MD  simvastatin (ZOCOR) 40 MG tablet Take 1 tablet by mouth daily. 07/18/15  Yes Historical Provider, MD  ziprasidone (GEODON) 60 MG capsule Take 1 capsule by mouth 2 (two) times daily. 10/08/14  Yes Historical Provider, MD  ciprofloxacin (CIPRO) 500 MG tablet Take 1 tablet (500 mg total) by mouth 2 (two) times daily. 03/01/16   Benjiman Core, MD  metroNIDAZOLE (FLAGYL) 500 MG tablet Take 1 tablet (500 mg total) by mouth 3 (three) times daily. 03/01/16   Benjiman Core, MD  oxyCODONE-acetaminophen (PERCOCET/ROXICET) 5-325 MG tablet Take 1-2 tablets by mouth every 6 (six) hours as needed for moderate pain. Take a stool softener while you are on the oxycodone. 03/01/16   Benjiman Core, MD  promethazine (PHENERGAN) 25 MG tablet Take 1 tablet (25 mg total) by mouth every 6 (six) hours as needed for nausea. 03/01/16   Benjiman Core, MD    Family History Family History  Problem Relation Age of Onset  . Dementia Mother   . Dementia Father   . Other Son     MVA accident    Social History Social History  Substance Use Topics  . Smoking status: Former Smoker    Packs/day: 4.00    Types: Cigarettes    Quit date: 07/06/2008  . Smokeless tobacco: Never Used  . Alcohol use No     Allergies   Aminophylline; Sumatriptan; Theophylline; Codeine; Depakote [divalproex sodium]; Divalproex sodium; Doxycycline; Lamictal [lamotrigine]; Magnesium-containing compounds; Naproxen; Other; Penicillins; Pentazocine lactate; Relafen [nabumetone]; Risperidone; Seroquel [quetiapine]; Tegretol [carbamazepine]; Tetracyclines & related; Vistaril [hydroxyzine hcl]; Tetracycline; and Zyprexa [olanzapine]   Review of Systems Review of Systems  Constitutional: Positive for  appetite change and fever. Negative for activity change.  HENT: Negative for trouble swallowing.   Eyes: Negative for pain.  Respiratory: Negative for chest tightness and shortness of breath.   Cardiovascular: Negative for chest pain and leg swelling.  Gastrointestinal: Positive for abdominal pain. Negative for anal bleeding, diarrhea, nausea and vomiting.  Genitourinary: Negative for dysuria and flank pain.  Musculoskeletal: Negative for back pain and neck stiffness.  Skin: Negative for rash.  Neurological: Negative for weakness, numbness and headaches.  Psychiatric/Behavioral: Negative for behavioral problems.  All other systems reviewed and are negative.    Physical Exam Updated Vital Signs BP 118/77   Pulse 65   Temp 97.2 F (36.2 C) (Oral)   Resp 16   Ht 5\' 8"  (1.727 m)  Wt 126 lb (57.2 kg)   SpO2 99%   BMI 19.16 kg/m   Physical Exam  Constitutional: She appears well-developed and well-nourished.  HENT:  Head: Atraumatic.  Eyes: EOM are normal.  Neck: Neck supple.  Cardiovascular: Normal rate.   Pulmonary/Chest: Effort normal.  Abdominal: There is tenderness.  Tenderness over her lower abdomen, particularly left lower quadrant. No mass.  Musculoskeletal: She exhibits no tenderness.  Neurological: She is alert.  Skin: Skin is warm. Capillary refill takes less than 2 seconds.     ED Treatments / Results  Labs (all labs ordered are listed, but only abnormal results are displayed) Labs Reviewed  BASIC METABOLIC PANEL - Abnormal; Notable for the following:       Result Value   Potassium 3.4 (*)    Glucose, Bld 120 (*)    All other components within normal limits  CBC WITH DIFFERENTIAL/PLATELET - Abnormal; Notable for the following:    Hemoglobin 11.9 (*)    All other components within normal limits  URINALYSIS, ROUTINE W REFLEX MICROSCOPIC (NOT AT [facility]) - Abnormal; Notable for the following:    Specific Gravity, Urine <1.005 (*)    All other components  within normal limits    EKG  EKG Interpretation None       Radiology Ct Abdomen Pelvis W Contrast  Result Date: 03/01/2016 CLINICAL DATA:  Left lower quadrant abdominal pain. History of irritable bowel syndrome, COPD and hysterectomy. EXAM: CT ABDOMEN AND PELVIS WITH CONTRAST TECHNIQUE: Multidetector CT imaging of the abdomen and pelvis was performed using the standard protocol following bolus administration of intravenous contrast. CONTRAST:  ISOVUE-300 IOPAMIDOL (ISOVUE-300) INJECTION 61% COMPARISON:  Abdominal pelvic CT 08/03/2015 FINDINGS: Lower chest: Ground-glass opacities and tree-in-bud nodularity are noted centrally in the right lower lobe, likely inflammatory. The left lung base appears clear. There is no significant pleural or pericardial effusion. Hepatobiliary: 1.6 cm somewhat linear area of low density in the lateral segment of the left hepatic lobe on image 21 is unchanged. There is an ill-defined 6 mm lesion in the dome of the right hepatic lobe on image number 12 which is not evident on the prior study. In addition, there is an 8 mm low-density lesion inferiorly in the medial segment of the left lobe (segment 4B) which appears new. No morphologic changes of cirrhosis. No evidence of gallstones, gallbladder wall thickening or biliary dilatation. Pancreas: Unremarkable. No pancreatic ductal dilatation or surrounding inflammatory changes. Spleen: Normal in size without focal abnormality. Adrenals/Urinary Tract: Both adrenal glands appear normal. Stable 12 mm cyst in the upper pole of the left kidney. No evidence of renal mass, hydronephrosis or urinary tract calculus. The bladder appears unremarkable. Stomach/Bowel: The stomach, small bowel, appendix and proximal colon demonstrate no acute findings. There is moderate stool throughout the colon. There are diverticular changes throughout the sigmoid colon with mild wall thickening and mild surrounding inflammatory change. These  findings are suspicious for mild recurrent diverticulitis, although the appearance is improved compared with the prior study. Vascular/Lymphatic: There are no enlarged abdominal or pelvic lymph nodes. Aortic and branch vessel atherosclerosis. No acute vascular findings seen. Reproductive: Hysterectomy.  No evidence of adnexal mass. Other: The anterior abdominal wall appears normal. No ascites or free air. Musculoskeletal: No acute or significant osseous findings. IMPRESSION: 1. Possible mild recurrent sigmoid diverticulitis. The findings are not as severe as those demonstrated on the prior study of 7 months ago. No evidence of bowel perforation or obstruction. 2. Patchy ground-glass opacity and tree-in-bud  nodularity at the right lung base, suspicious for inflammation. Chest radiographic follow up recommended. 3. **An incidental finding of potential clinical significance has been found. Small ill-defined hepatic lesions, 2 of which may be new compared with prior study (versus better visualized). These are indeterminate in etiology. Management options include abdominal MRI for further characterization and CT follow-up in 6 months to assess stability. ** 4.  Aortic Atherosclerosis (ICD10-170.0) Electronically Signed   By: Carey Bullocks M.D.   On: 03/01/2016 19:07    Procedures Procedures (including critical care time)  Medications Ordered in ED Medications  diatrizoate meglumine-sodium (GASTROGRAFIN) 66-10 % solution (not administered)  sodium chloride 0.9 % bolus 500 mL (0 mLs Intravenous Stopped 03/01/16 1539)  fentaNYL (SUBLIMAZE) injection 50 mcg (50 mcg Intravenous Given 03/01/16 1851)  iopamidol (ISOVUE-300) 61 % injection 100 mL (100 mLs Intravenous Contrast Given 03/01/16 1842)     Initial Impression / Assessment and Plan / ED Course  I have reviewed the triage vital signs and the nursing notes.  Pertinent labs & imaging results that were available during my care of the patient were reviewed  by me and considered in my medical decision making (see chart for details).  Clinical Course    Patient with abdominal pain like previous diverticulitis. Labs reassuring. CT scan shows likely diverticulitis. Patient still appears uncomfortable but does not want to be admitted. I think it reasonable to give a trial at home. Given antibiotics pain medicines and antiemetics. Discharge to follow-up. Patient was informed about the liver findings on her CT scan and also follow-up with GI for the diverticulitis.  Final Clinical Impressions(s) / ED Diagnoses   Final diagnoses:  Diverticulitis of large intestine without perforation or abscess without bleeding    New Prescriptions New Prescriptions   CIPROFLOXACIN (CIPRO) 500 MG TABLET    Take 1 tablet (500 mg total) by mouth 2 (two) times daily.   METRONIDAZOLE (FLAGYL) 500 MG TABLET    Take 1 tablet (500 mg total) by mouth 3 (three) times daily.   OXYCODONE-ACETAMINOPHEN (PERCOCET/ROXICET) 5-325 MG TABLET    Take 1-2 tablets by mouth every 6 (six) hours as needed for moderate pain. Take a stool softener while you are on the oxycodone.   PROMETHAZINE (PHENERGAN) 25 MG TABLET    Take 1 tablet (25 mg total) by mouth every 6 (six) hours as needed for nausea.     Benjiman Core, MD 03/01/16 [phone]

## 2016-03-01 NOTE — Discharge Instructions (Signed)
Also follow-up with the other findings in your liver on the CT scan.

## 2016-03-01 NOTE — ED Triage Notes (Signed)
Reports abd pain and nausea for over a week.  States feels like diverticulitis flare in past.

## 2016-03-12 ENCOUNTER — Other Ambulatory Visit (INDEPENDENT_AMBULATORY_CARE_PROVIDER_SITE_OTHER): Payer: Self-pay | Admitting: Internal Medicine

## 2016-03-30 ENCOUNTER — Other Ambulatory Visit: Payer: Self-pay | Admitting: Neurosurgery

## 2016-03-30 DIAGNOSIS — M5412 Radiculopathy, cervical region: Secondary | ICD-10-CM

## 2016-03-30 DIAGNOSIS — M5414 Radiculopathy, thoracic region: Secondary | ICD-10-CM

## 2016-04-08 ENCOUNTER — Ambulatory Visit
Admission: RE | Admit: 2016-04-08 | Discharge: 2016-04-08 | Disposition: A | Discharge status: Home / Self Care | Payer: Medicare Other | Source: Ambulatory Visit | Attending: Neurosurgery | Admitting: Neurosurgery

## 2016-04-08 ENCOUNTER — Other Ambulatory Visit: Payer: Medicare Other

## 2016-04-08 DIAGNOSIS — M5412 Radiculopathy, cervical region: Secondary | ICD-10-CM

## 2016-04-08 DIAGNOSIS — M5414 Radiculopathy, thoracic region: Secondary | ICD-10-CM

## 2016-04-08 MED ORDER — IOPAMIDOL (ISOVUE-M 300) INJECTION 61%
10.0000 mL | Freq: Once | INTRAMUSCULAR | Status: AC | PRN
  Administered 2016-04-08: 10 mL via INTRATHECAL

## 2016-04-08 MED ORDER — ONDANSETRON HCL 4 MG/2ML IJ SOLN: 4.0000 mg | Freq: Four times a day (QID) | INTRAMUSCULAR | Status: DC | PRN

## 2016-04-08 MED ORDER — DIAZEPAM 5 MG PO TABS
10.0000 mg | ORAL_TABLET | Freq: Once | ORAL | Status: AC
  Administered 2016-04-08: 10 mg via ORAL

## 2016-04-08 NOTE — Discharge Instructions (Addendum)
Myelogram Discharge Instructions  1. Go home and rest quietly for the next 24 hours.  It is important to lie flat for the next 24 hours.  Get up only to go to the restroom.  You may lie in the bed or on a couch on your back, your stomach, your left side or your right side.  You may have one pillow under your head.  You may have pillows between your knees while you are on your side or under your knees while you are on your back.  2. DO NOT drive today.  Recline the seat as far back as it will go, while still wearing your seat belt, on the way home.  3. You may get up to go to the bathroom as needed.  You may sit up for 10 minutes to eat.  You may resume your normal diet and medications unless otherwise indicated.  Drink lots of extra fluids today and tomorrow.  4. The incidence of headache, nausea, or vomiting is about 5% (one in 20 patients).  If you develop a headache, lie flat and drink plenty of fluids until the headache goes away.  Caffeinated beverages may be helpful.  If you develop severe nausea and vomiting or a headache that does not go away with flat bed rest, call [phone].  5. You may resume normal activities after your 24 hours of bed rest is over; however, do not exert yourself strongly or do any heavy lifting tomorrow. If when you get up you have a headache when standing, go back to bed and force fluids for another 24 hours.  6. Call your physician for a follow-up appointment.  The results of your myelogram will be sent directly to your physician by the following day.  7. If you have any questions or if complications develop after you arrive home, please call [phone].  Discharge instructions have been explained to the patient.  The patient, or the person responsible for the patient, fully understands these instructions.        May resume [place] on Oct. 5, 2017, after 1:00 pm.

## 2016-04-10 ENCOUNTER — Telehealth: Payer: Self-pay

## 2016-04-10 NOTE — Telephone Encounter (Signed)
Spoke with patient after she had a total myelogram here 04/08/16.  She reports having a headache at the base of her skull and in her neck which is better, but still present, when she is lying down.  Yesterday she had nausea and vomiting, worse when she was upright.  Today she says she just is nauseated.  She states she is drinking a lot of water and has continued her bedrest from the myelogram.  She also states her shoulders hurt and that this is a new symptom for her.  After much conversation, we agreed her positional headache and nausea probably were caused by a low pressure in her head secondary to the lumbar puncture two days ago.  I told her her shoulder pain was most likely from two days of bedrest.  I encouraged her to add caffeinated drinks to her diet; she is going to make some coffee.  I called Dr. Cassandria SanteeBotero's office and left [place] a message requesting a blood patch order.  The patient prefers to continue on bedrest but will check in on Monday (today is Friday) is she changes her mind about a blood patch.  jkl

## 2016-04-24 ENCOUNTER — Other Ambulatory Visit (INDEPENDENT_AMBULATORY_CARE_PROVIDER_SITE_OTHER): Payer: Self-pay | Admitting: Internal Medicine

## 2016-06-15 ENCOUNTER — Ambulatory Visit (INDEPENDENT_AMBULATORY_CARE_PROVIDER_SITE_OTHER): Payer: Medicare Other | Admitting: Adult Health

## 2016-06-15 ENCOUNTER — Encounter: Payer: Self-pay | Admitting: Adult Health

## 2016-06-15 ENCOUNTER — Encounter (INDEPENDENT_AMBULATORY_CARE_PROVIDER_SITE_OTHER): Payer: Self-pay

## 2016-06-15 VITALS — BP 140/60 | HR 82 | Ht 66.0 in | Wt 132.5 lb

## 2016-06-15 DIAGNOSIS — N941 Unspecified dyspareunia: Secondary | ICD-10-CM

## 2016-06-15 DIAGNOSIS — Z01411 Encounter for gynecological examination (general) (routine) with abnormal findings: Secondary | ICD-10-CM

## 2016-06-15 DIAGNOSIS — Z1211 Encounter for screening for malignant neoplasm of colon: Secondary | ICD-10-CM

## 2016-06-15 DIAGNOSIS — Z01419 Encounter for gynecological examination (general) (routine) without abnormal findings: Secondary | ICD-10-CM | POA: Insufficient documentation

## 2016-06-15 DIAGNOSIS — N952 Postmenopausal atrophic vaginitis: Secondary | ICD-10-CM

## 2016-06-15 LAB — HEMOCCULT GUIAC POC 1CARD (OFFICE): Fecal Occult Blood, POC: NEGATIVE

## 2016-06-15 MED ORDER — ESTROGENS, CONJUGATED 0.625 MG/GM VA CREA: TOPICAL_CREAM | VAGINAL | 0 refills | Status: DC

## 2016-06-15 NOTE — Progress Notes (Signed)
Patient ID: Kristen Ryan, female   DOB: 1955-10-29, 60 y.o.   MRN: [id number] History of Present Illness: Kristen Ryan is a 60 year old white female,married in for a well woman gyn exam, she is sp hysterectomy.Her Mom died recently,and Dad in his 7390's, and she lives next door. PCP is Dr Janna ArchonDiego.    Current Medications, Allergies, Past Medical History, Past Surgical History, Family History and Social History were reviewed in [facility] electronic medical record.     Review of Systems: Patient denies any daily headaches(but having migraines at times), hearing loss, fatigue, blurred vision,abdominal pain, problems with bowel movements, urination(pees often but drinks a lot of water).+ pain with sex, has pain in right elbow(athritis) and is moody at times has bipolar disorder. Has chest pain relived by Tums and is short of breath at times, has COPD   Physical Exam:BP 140/60 (BP Location: Left Arm, Patient Position: Sitting, Cuff Size: Normal)   Pulse 82   Ht 5\' 6"  (1.676 m)   Wt 132 lb 8 oz (60.1 kg)   BMI 21.39 kg/m  General:  Well developed, well nourished, no acute distress Skin:  Warm and dry Neck:  Midline trachea, normal thyroid, good ROM, no lymphadenopathy,no carotid bruits heard Lungs; Has some faint wheezes bilaterally Breast:  No dominant palpable mass, retraction, or nipple discharge, has tattoos Cardiovascular: Regular rate and rhythm Abdomen:  Soft, non tender, no hepatosplenomegaly Pelvic:  External genitalia is normal in appearance, no lesions.  The vagina is pale dry and has loss of rugae. Urethra has no lesions or masses. The cervix and uterus are absent. No adnexal masses or tenderness noted.Bladder is non tender, no masses felt. Rectal: Good sphincter tone, no polyps, or hemorrhoids felt.  Hemoccult negative. Extremities/musculoskeletal:  No swelling or varicosities noted, no clubbing or cyanosis Psych:  No mood changes, alert and cooperative,seems happy PHQ 9 score  14, is on meds and sees Dr Emerson MonteParrish McKinney who is retiring and then will see Deatra RobinsonKaren Jones or Dr Tenny Crawoss.She denies stroke, MI, DVT or breast cancer, so will try some vaginal estrogen to see if helps.Continue to use [place].   Impression: 1. Well woman exam with routine gynecological exam   2. Vaginal atrophy   3. Dyspareunia, female       Plan: Given 24 gms of premarin vaginal cream use 1 gm in vagina for 2 weeks then 2-3 x weekly(lot [id number] exp 3/19) Follow up in 5 weeks, will let me know if wants Rx Physical in 2 years Mammogram yearly Colonoscopy per [facility] with PCP

## 2016-06-15 NOTE — Patient Instructions (Signed)
Use 1 gm of premarin in vagina daily for 2 weeks the 2-3 x weekly F/u in 5 weeks Physical in 2 years Mammogram yearly

## 2016-06-23 ENCOUNTER — Telehealth: Payer: Self-pay | Admitting: Adult Health

## 2016-06-23 MED ORDER — ESTROGENS, CONJUGATED 0.625 MG/GM VA CREA: TOPICAL_CREAM | VAGINAL | 1 refills | Status: DC

## 2016-06-23 NOTE — Telephone Encounter (Signed)
Will send rx for PVC to [facility]

## 2016-06-23 NOTE — Telephone Encounter (Signed)
Spoke with pt. Pt states the Premarin samples are working good and she wants prescription sent to [facility]. Thanks!! JSY

## 2016-07-08 ENCOUNTER — Other Ambulatory Visit (INDEPENDENT_AMBULATORY_CARE_PROVIDER_SITE_OTHER): Payer: Self-pay | Admitting: Internal Medicine

## 2016-07-08 ENCOUNTER — Telehealth (HOSPITAL_COMMUNITY): Payer: Self-pay | Admitting: *Deleted

## 2016-07-08 NOTE — Telephone Encounter (Signed)
phone call regarding appointment, person answering said she is not home.

## 2016-07-10 NOTE — Telephone Encounter (Signed)
Patient must have OV appointment prior to further refills per Dr.Rehman. She was last seen in the office 02/2015.

## 2016-07-20 ENCOUNTER — Encounter (INDEPENDENT_AMBULATORY_CARE_PROVIDER_SITE_OTHER): Payer: Self-pay

## 2016-07-20 ENCOUNTER — Encounter (INDEPENDENT_AMBULATORY_CARE_PROVIDER_SITE_OTHER): Payer: Self-pay | Admitting: Internal Medicine

## 2016-07-20 ENCOUNTER — Ambulatory Visit: Payer: Medicare Other | Admitting: Adult Health

## 2016-07-20 NOTE — Telephone Encounter (Signed)
An appointment was given for 08/25/16 at 9:30am with Dorene Arerri Setzer, NP.  A letter was mailed to the patient.

## 2016-07-28 ENCOUNTER — Encounter: Payer: Self-pay | Admitting: Adult Health

## 2016-07-28 ENCOUNTER — Ambulatory Visit (INDEPENDENT_AMBULATORY_CARE_PROVIDER_SITE_OTHER): Payer: Medicare Other | Admitting: Adult Health

## 2016-07-28 VITALS — BP 108/67 | HR 69 | Ht 67.75 in | Wt 133.5 lb

## 2016-07-28 DIAGNOSIS — R1012 Left upper quadrant pain: Secondary | ICD-10-CM | POA: Diagnosis not present

## 2016-07-28 DIAGNOSIS — N952 Postmenopausal atrophic vaginitis: Secondary | ICD-10-CM | POA: Diagnosis not present

## 2016-07-28 NOTE — Patient Instructions (Signed)
US 1/25 at 2:30 pm do not eat or drink for 8 hours prior at [facility]

## 2016-07-28 NOTE — Progress Notes (Signed)
Subjective:     Patient ID: Kristen Ryan, female   DOB: 10-07-1955, 61 y.o.   MRN: [id number]  HPI Kristen Ryan is a 61 year old white female, back in follow up of starting PVC for vaginal atrophy and it is much better, can have sex now.   Review of Systems  Vagina better LUQ pain Reviewed past medical,surgical, social and family history. Reviewed medications and allergies.     Objective:   Physical Exam BP 108/67 (BP Location: Left Arm, Patient Position: Sitting, Cuff Size: Normal)   Pulse 69   Ht 5' 7.75" (1.721 m)   Wt 133 lb 8 oz (60.6 kg)   BMI 20.45 kg/m    Skin warm and dry.Pelvic: external genitalia is normal in appearance no lesions, vagina: has better color, and moisture since using the PVC. Abdomen is soft, and has tenderness LUQ and ? Enlarged spleen. Will get US to assess. PHQ 9 score is 14,she  is on meds.  Assessment:     1. Vaginal atrophy   2. LUQ pain       Plan:     Continue premarin vaginal cream  Get abdominal US 1/25 at 2:30 pm at [facility]    Follow up prn

## 2016-07-30 ENCOUNTER — Ambulatory Visit (HOSPITAL_COMMUNITY)
Admission: RE | Admit: 2016-07-30 | Discharge: 2016-07-30 | Disposition: A | Discharge status: Home / Self Care | Payer: Medicare Other | Source: Ambulatory Visit | Attending: Adult Health | Admitting: Adult Health

## 2016-07-30 ENCOUNTER — Telehealth: Payer: Self-pay | Admitting: Adult Health

## 2016-07-30 DIAGNOSIS — R1012 Left upper quadrant pain: Secondary | ICD-10-CM | POA: Insufficient documentation

## 2016-07-30 DIAGNOSIS — N281 Cyst of kidney, acquired: Secondary | ICD-10-CM | POA: Diagnosis not present

## 2016-07-30 NOTE — Telephone Encounter (Signed)
Left message that abdominal US was normal, has stable renal cyst

## 2016-08-25 ENCOUNTER — Ambulatory Visit (INDEPENDENT_AMBULATORY_CARE_PROVIDER_SITE_OTHER): Payer: Medicare Other | Admitting: Internal Medicine
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
## 2016-08-27 ENCOUNTER — Encounter (INDEPENDENT_AMBULATORY_CARE_PROVIDER_SITE_OTHER): Payer: Self-pay | Admitting: Internal Medicine

## 2016-08-27 ENCOUNTER — Ambulatory Visit (INDEPENDENT_AMBULATORY_CARE_PROVIDER_SITE_OTHER): Payer: Medicare Other | Admitting: Internal Medicine

## 2016-08-27 ENCOUNTER — Encounter (HOSPITAL_COMMUNITY): Payer: Self-pay | Admitting: Psychiatry

## 2016-08-27 ENCOUNTER — Ambulatory Visit (INDEPENDENT_AMBULATORY_CARE_PROVIDER_SITE_OTHER): Payer: Medicare Other | Admitting: Psychiatry

## 2016-08-27 VITALS — BP 128/74 | HR 88 | Temp 98.0°F | Ht 68.0 in | Wt 129.2 lb

## 2016-08-27 VITALS — BP 138/76 | HR 74 | Ht 67.75 in | Wt 129.4 lb

## 2016-08-27 DIAGNOSIS — Z818 Family history of other mental and behavioral disorders: Secondary | ICD-10-CM | POA: Diagnosis not present

## 2016-08-27 DIAGNOSIS — Z79899 Other long term (current) drug therapy: Secondary | ICD-10-CM

## 2016-08-27 DIAGNOSIS — Z87891 Personal history of nicotine dependence: Secondary | ICD-10-CM | POA: Diagnosis not present

## 2016-08-27 DIAGNOSIS — K580 Irritable bowel syndrome with diarrhea: Secondary | ICD-10-CM | POA: Diagnosis not present

## 2016-08-27 DIAGNOSIS — F3162 Bipolar disorder, current episode mixed, moderate: Secondary | ICD-10-CM | POA: Diagnosis not present

## 2016-08-27 DIAGNOSIS — Z7982 Long term (current) use of aspirin: Secondary | ICD-10-CM

## 2016-08-27 DIAGNOSIS — Z810 Family history of intellectual disabilities: Secondary | ICD-10-CM

## 2016-08-27 DIAGNOSIS — Z9889 Other specified postprocedural states: Secondary | ICD-10-CM

## 2016-08-27 DIAGNOSIS — Z90710 Acquired absence of both cervix and uterus: Secondary | ICD-10-CM

## 2016-08-27 DIAGNOSIS — Z880 Allergy status to penicillin: Secondary | ICD-10-CM | POA: Diagnosis not present

## 2016-08-27 DIAGNOSIS — Z888 Allergy status to other drugs, medicaments and biological substances status: Secondary | ICD-10-CM | POA: Diagnosis not present

## 2016-08-27 DIAGNOSIS — Z79891 Long term (current) use of opiate analgesic: Secondary | ICD-10-CM

## 2016-08-27 HISTORY — DX: Bipolar disorder, current episode mixed, moderate: F31.62

## 2016-08-27 MED ORDER — ZIPRASIDONE HCL 60 MG PO CAPS: 60.0000 mg | ORAL_CAPSULE | Freq: Two times a day (BID) | ORAL | 1 refills | Status: DC

## 2016-08-27 MED ORDER — LORAZEPAM 1 MG PO TABS: 1.0000 mg | ORAL_TABLET | Freq: Three times a day (TID) | ORAL | 0 refills | Status: DC | PRN

## 2016-08-27 MED ORDER — GABAPENTIN 300 MG PO CAPS: 600.0000 mg | ORAL_CAPSULE | Freq: Every day | ORAL | 1 refills | Status: DC

## 2016-08-27 MED ORDER — BENZTROPINE MESYLATE 1 MG PO TABS: 1.0000 mg | ORAL_TABLET | Freq: Two times a day (BID) | ORAL | 1 refills | Status: DC

## 2016-08-27 MED ORDER — TRAZODONE HCL 50 MG PO TABS: 50.0000 mg | ORAL_TABLET | Freq: Every evening | ORAL | 1 refills | Status: DC | PRN

## 2016-08-27 NOTE — Patient Instructions (Addendum)
1. Continue ziprasidone 60 mg twice a day 2. Increase gabapentin 600 mg at night 3. Start Trazodone 50 mg at night as needed for sleep 4. Stat lorazepam 1 mg three times a day as needed for anxiety 5. Discontinue Xanax, quetiapine 6. Return to clinic in one month

## 2016-08-27 NOTE — Patient Instructions (Signed)
Continue the Dicyclomine.  OV in one year.

## 2016-08-27 NOTE — Progress Notes (Signed)
Subjective:    Patient ID: Kristen Ryan, female    DOB: 22-Jul-1955, 61 y.o.   MRN: [id number]  HPI PCP Dr. Janna Arch. Here today for f/u. She was last seen in August of 2016. She tells me she is doing well. Hx of IBS: diarrhea. Takes Dicyclomine TID.    She is having 3 stools a day. Stools are formed. Her appetite is good. Her last weight  was 157 in August of 2016. Today her weight is 129.3. She has been under a lot of stress. Her appetite is good.  CT scan in August of 2017 revealed possible mild recurrent sigmoid diverticulitis.   She underwent a colonoscopy in February 2016 which revealed Impression:  Examination performed to cecum. Redundant colon with a moderate number of diverticula at sigmoid colon. Small external hemorrhoids.   Review of Systems Past Medical History:  Diagnosis Date  . Anemia   . Asthma   . Bipolar 1 disorder (HCC)   . Bronchitis   . COPD (chronic obstructive pulmonary disease) (HCC)   . Diverticulitis   . IBS (irritable bowel syndrome)   . Neuropathy (HCC)   . Schizophrenia (HCC)   . Thyroid disease     Past Surgical History:  Procedure Laterality Date  . ABDOMINAL HYSTERECTOMY    . broke left arm    . CESAREAN SECTION    . COLONOSCOPY N/A 08/30/2014   Procedure: COLONOSCOPY;  Surgeon: Malissa Hippo, MD;  Location: [facility];  Service: Endoscopy;  Laterality: N/A;  1200  . ELBOW SURGERY    . FOOT SURGERY    . KNEE SURGERY    . TONSILLECTOMY      Allergies  Allergen Reactions  . Aminophylline Anaphylaxis  . Sumatriptan Anaphylaxis and Other (See Comments)    Causes fainting  . Theophylline Anaphylaxis  . Codeine Itching and Other (See Comments)    Too strong for patient   . Depakote [Divalproex Sodium] Other (See Comments)    Hair loss  . Divalproex Sodium Other (See Comments)    Causes hair to fall out  . Doxycycline Other (See Comments)    headache  . Lamictal [Lamotrigine]     Destroys thyroid  . Magnesium-Containing  Compounds Hives  . Metronidazole   . Nabumetone Swelling  . Naproxen Swelling  . Other     Hair dye  . Penicillins Nausea And Vomiting    Has patient had a PCN reaction causing immediate rash, facial/tongue/throat swelling, SOB or lightheadedness with hypotension: Yes Has patient had a PCN reaction causing severe rash involving mucus membranes or skin necrosis: No Has patient had a PCN reaction that required hospitalization Yes Has patient had a PCN reaction occurring within the last 10 years: No If all of the above answers are "NO", then may proceed with Cephalosporin use.   Marland Kitchen Pentazocine Lactate Other (See Comments)    Unknown reaction  . Risperidone Other (See Comments)    Insomnia   . Seroquel [Quetiapine] Swelling  . Tegretol [Carbamazepine] Swelling  . Vistaril [Hydroxyzine Hcl] Nausea And Vomiting  . Tetracycline Rash    Headaches  . Zyprexa [Olanzapine] Rash and Other (See Comments)    Insomnia    Current Outpatient Prescriptions on File Prior to Visit  Medication Sig Dispense Refill  . albuterol (PROVENTIL) (2.5 MG/3ML) 0.083% nebulizer solution Take 3 mLs (2.5 mg total) by nebulization every 6 (six) hours as needed for wheezing. 75 mL 12  . Albuterol Sulfate (PROVENTIL HFA IN) Inhale 1-2  puffs into the lungs as needed (shorntess of breath).     . ALPRAZolam (XANAX) 1 MG tablet Take 1 mg by mouth 3 (three) times daily.    Marland Kitchen. aspirin 325 MG tablet Take 325 mg by mouth daily.    . benztropine (COGENTIN) 1 MG tablet Take 1 mg by mouth 2 (two) times daily.    . budesonide-formoterol (SYMBICORT) 160-4.5 MCG/ACT inhaler Inhale 2 puffs into the lungs 2 (two) times daily. 1 Inhaler 1  . cetirizine (ZYRTEC) 10 MG tablet Take 10 mg by mouth daily.    . Cholecalciferol (VITAMIN D-3) 5000 units TABS Take by mouth 2 (two) times daily.    Marland Kitchen. conjugated estrogens (PREMARIN) vaginal cream Use 1 gm in vagina  2-3 x weekly 42.5 g 1  . dicyclomine (BENTYL) 10 MG capsule TAKE 1 CAPSULE BY  MOUTH THREE TIMES DAILY AS NEEDED FOR SPASMS. 90 capsule 1  . gabapentin (NEURONTIN) 300 MG capsule Take 300 mg by mouth at bedtime.     Marland Kitchen. levothyroxine (SYNTHROID, LEVOTHROID) 50 MCG tablet Take 50 mcg by mouth daily.    Marland Kitchen. MELATONIN PO Take 12 mg by mouth as needed.     . Multiple Vitamin (MULTIVITAMIN WITH MINERALS) TABS tablet Take 1 tablet by mouth daily.    . Oxycodone HCl 10 MG TABS Take by mouth 4 (four) times daily.    . QUEtiapine (SEROQUEL) 100 MG tablet Take 100 mg by mouth at bedtime.    . Selenium 200 MCG TABS Take by mouth daily.    . simvastatin (ZOCOR) 40 MG tablet Take 1 tablet by mouth daily.    . sodium chloride (MURO 128) 5 % ophthalmic solution Place 3 drops into both eyes as needed for irritation.    . ziprasidone (GEODON) 60 MG capsule Take 1 capsule by mouth 2 (two) times daily.    . [DISCONTINUED] carbamazepine (TEGRETOL) 200 MG tablet Take 400-600 tablets by mouth 2 (two) times daily. 2 tablets in the morning and 3 tablets at bedtime.     No current facility-administered medications on file prior to visit.        Objective:   Physical Exam Blood pressure 128/74, pulse 88, temperature 98 F (36.7 C), height 5\' 8"  (1.727 m), weight 129 lb 3.2 oz (58.6 kg).  Alert and oriented. Skin warm and dry. Oral mucosa is moist.   . Sclera anicteric, conjunctivae is pink. Thyroid not enlarged. No cervical lymphadenopathy. Lungs clear. Heart regular rate and rhythm.  Abdomen is soft. Bowel sounds are positive. No hepatomegaly. No abdominal masses felt. No tenderness.  No edema to lower extremities.         Assessment & Plan:  IBS: diarrhea. Continue the Dicyclomine. She is doing well.  OV in 1 year.
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
## 2016-09-24 ENCOUNTER — Ambulatory Visit (INDEPENDENT_AMBULATORY_CARE_PROVIDER_SITE_OTHER): Payer: Medicare Other | Admitting: Psychiatry

## 2016-09-24 VITALS — BP 122/82 | HR 86 | Wt 131.4 lb

## 2016-09-24 DIAGNOSIS — Z79899 Other long term (current) drug therapy: Secondary | ICD-10-CM | POA: Diagnosis not present

## 2016-09-24 DIAGNOSIS — Z87891 Personal history of nicotine dependence: Secondary | ICD-10-CM

## 2016-09-24 DIAGNOSIS — F3162 Bipolar disorder, current episode mixed, moderate: Secondary | ICD-10-CM | POA: Diagnosis not present

## 2016-09-24 DIAGNOSIS — Z810 Family history of intellectual disabilities: Secondary | ICD-10-CM | POA: Diagnosis not present

## 2016-09-24 DIAGNOSIS — Z818 Family history of other mental and behavioral disorders: Secondary | ICD-10-CM | POA: Diagnosis not present

## 2016-09-24 DIAGNOSIS — Z79891 Long term (current) use of opiate analgesic: Secondary | ICD-10-CM

## 2016-09-24 MED ORDER — CLONIDINE HCL 0.1 MG PO TABS: 0.1000 mg | ORAL_TABLET | Freq: Two times a day (BID) | ORAL | 1 refills | Status: DC

## 2016-09-24 MED ORDER — GABAPENTIN 300 MG PO CAPS: 600.0000 mg | ORAL_CAPSULE | Freq: Every day | ORAL | 1 refills | Status: DC

## 2016-09-24 MED ORDER — BENZTROPINE MESYLATE 1 MG PO TABS: 1.0000 mg | ORAL_TABLET | Freq: Two times a day (BID) | ORAL | 1 refills | Status: DC

## 2016-09-24 MED ORDER — TRAZODONE HCL 50 MG PO TABS: 50.0000 mg | ORAL_TABLET | Freq: Every evening | ORAL | 1 refills | Status: DC | PRN

## 2016-09-24 MED ORDER — ZIPRASIDONE HCL 60 MG PO CAPS: 60.0000 mg | ORAL_CAPSULE | Freq: Two times a day (BID) | ORAL | 1 refills | Status: DC

## 2016-09-24 MED ORDER — LORAZEPAM 1 MG PO TABS: 1.0000 mg | ORAL_TABLET | Freq: Three times a day (TID) | ORAL | 0 refills | Status: DC | PRN

## 2016-09-24 NOTE — Patient Instructions (Addendum)
1. Continue ziprasidone 60 mg twice a day 2. Continue gabapentin  600 mg at night 3. Start clonidine 0.1 mg twice a day  4. Continue Trazodone 50 mg at night as needed for sleep 5. Continue lorazepam 1 mg three times a day as needed for anxiety 6. Return to clinic in one month for 30 mins.  7. Referral to therapy

## 2016-09-25 ENCOUNTER — Telehealth (HOSPITAL_COMMUNITY): Payer: Self-pay | Admitting: *Deleted

## 2016-09-25 NOTE — Telephone Encounter (Signed)
The order is in the system. Please verify it with the pharmacy.

## 2016-09-25 NOTE — Telephone Encounter (Signed)
voice message from patient, she said she did not get her cloNIDine (CATAPRES) 0.1 MG tablet.

## 2016-09-29 NOTE — Telephone Encounter (Signed)
Called pt pharmacy and spoke with Harrold DonathNathan. Per Harrold DonathNathan, pt picked up her Clonidine already

## 2016-10-01 ENCOUNTER — Other Ambulatory Visit (HOSPITAL_COMMUNITY): Payer: Self-pay | Admitting: Psychiatry

## 2016-10-01 ENCOUNTER — Telehealth (HOSPITAL_COMMUNITY): Payer: Self-pay | Admitting: *Deleted

## 2016-10-01 MED ORDER — GABAPENTIN 300 MG PO CAPS: 900.0000 mg | ORAL_CAPSULE | Freq: Every day | ORAL | 0 refills | Status: DC

## 2016-10-01 NOTE — Telephone Encounter (Signed)
voice message from patient.  She said the Klonodine has messed with her blood pressure.   She said she is not sleeping.   She asked about an increase in ChadGabipentin.

## 2016-10-01 NOTE — Telephone Encounter (Signed)
Discussed with patient. She had dizziness when she started clonidine. Will increase gabapentin to target her mood, pain and insomnia.  - Increased gabapentin 900 mg at night (or 300 mg AM and 600 mg qhs if she has drowsiness with 900 mg qhs).
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
## 2016-10-26 ENCOUNTER — Ambulatory Visit (HOSPITAL_COMMUNITY): Payer: Self-pay | Admitting: Psychiatry

## 2016-10-27 ENCOUNTER — Telehealth (HOSPITAL_COMMUNITY): Payer: Self-pay | Admitting: *Deleted

## 2016-10-27 ENCOUNTER — Ambulatory Visit (INDEPENDENT_AMBULATORY_CARE_PROVIDER_SITE_OTHER): Payer: Medicare Other | Admitting: Psychiatry

## 2016-10-27 ENCOUNTER — Encounter (HOSPITAL_COMMUNITY): Payer: Self-pay | Admitting: Psychiatry

## 2016-10-27 DIAGNOSIS — F3162 Bipolar disorder, current episode mixed, moderate: Secondary | ICD-10-CM | POA: Diagnosis not present

## 2016-10-27 NOTE — Telephone Encounter (Signed)
Pt came into office to see another provider today. Per pt she need refill for her Ativan. Per pt chart, pt no showed for her appt on 10-26-2016. Informed pt with information and she stated she came yesterday and was informed that she did not have an appt until 10-27-2016. Informed pt that Dr. Vanetta Shawl is not in the office until 11-02-2016. Per pt she needs her Ativan to help her sleep. Per pt chart, she also takes Trazodone for sleep as well. Informed pt with information and she stated she takes both. Informed pt message will be sent to provider that is helping Dr. Vanetta Shawl. Called pt pharmacy and spoke with Ladona Ridgel and he stated pt last fill for her Ativan was 09-24-2016 with 30 tabs 0 refills. Pt verbalized understanding. Pt number is [phone]

## 2016-10-27 NOTE — Telephone Encounter (Signed)
You may send in enough for 7 days, then forward message to Dr. Vanetta Shawl
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
## 2016-10-28 ENCOUNTER — Telehealth (HOSPITAL_COMMUNITY): Payer: Self-pay | Admitting: *Deleted

## 2016-10-28 MED ORDER — LORAZEPAM 1 MG PO TABS: 1.0000 mg | ORAL_TABLET | Freq: Three times a day (TID) | ORAL | 0 refills | Status: DC | PRN

## 2016-10-28 NOTE — Telephone Encounter (Signed)
Per Dr. Tenny Craw to call pt pharmacy and call in only 7 days worth of her Ativan and to forward message to Dr. Vanetta Shawl as well. Pt is taking mediation TID PRN. Called pt pharmacy and spoke with [place] who verbalized understanding. Informed Algernon Huxley that further refills for this medication should be sent to Dr. Vanetta Shawl.

## 2016-10-28 NOTE — Telephone Encounter (Signed)
Called in pt Ativan to pharmacy per Dr. Tenny Craw. Spoke with Algernon Huxley

## 2016-11-02 NOTE — Telephone Encounter (Signed)
Please contact the patient to have follow up appointment so that we can continue to prescribe Ativan.

## 2016-11-02 NOTE — Telephone Encounter (Signed)
Spoke with pt and her f/u is sch for 11-03-2016 which is tomorrow.

## 2016-11-03 ENCOUNTER — Ambulatory Visit (INDEPENDENT_AMBULATORY_CARE_PROVIDER_SITE_OTHER): Payer: Medicare Other | Admitting: Psychiatry

## 2016-11-03 ENCOUNTER — Encounter (HOSPITAL_COMMUNITY): Payer: Self-pay | Admitting: Psychiatry

## 2016-11-03 VITALS — BP 103/69 | HR 93 | Ht 67.75 in | Wt 132.8 lb

## 2016-11-03 DIAGNOSIS — Z87891 Personal history of nicotine dependence: Secondary | ICD-10-CM | POA: Diagnosis not present

## 2016-11-03 DIAGNOSIS — J449 Chronic obstructive pulmonary disease, unspecified: Secondary | ICD-10-CM | POA: Diagnosis not present

## 2016-11-03 DIAGNOSIS — Z79899 Other long term (current) drug therapy: Secondary | ICD-10-CM | POA: Diagnosis not present

## 2016-11-03 DIAGNOSIS — F3160 Bipolar disorder, current episode mixed, unspecified: Secondary | ICD-10-CM

## 2016-11-03 DIAGNOSIS — K589 Irritable bowel syndrome without diarrhea: Secondary | ICD-10-CM

## 2016-11-03 DIAGNOSIS — F419 Anxiety disorder, unspecified: Secondary | ICD-10-CM

## 2016-11-03 DIAGNOSIS — F3162 Bipolar disorder, current episode mixed, moderate: Secondary | ICD-10-CM

## 2016-11-03 DIAGNOSIS — G629 Polyneuropathy, unspecified: Secondary | ICD-10-CM

## 2016-11-03 DIAGNOSIS — Z818 Family history of other mental and behavioral disorders: Secondary | ICD-10-CM | POA: Diagnosis not present

## 2016-11-03 MED ORDER — TRAZODONE HCL 50 MG PO TABS: 50.0000 mg | ORAL_TABLET | Freq: Every evening | ORAL | 1 refills | Status: DC | PRN

## 2016-11-03 MED ORDER — LORAZEPAM 1 MG PO TABS: 1.0000 mg | ORAL_TABLET | Freq: Three times a day (TID) | ORAL | 0 refills | Status: DC | PRN

## 2016-11-03 MED ORDER — GABAPENTIN 300 MG PO CAPS: 900.0000 mg | ORAL_CAPSULE | Freq: Every day | ORAL | 0 refills | Status: DC

## 2016-11-03 MED ORDER — BENZTROPINE MESYLATE 1 MG PO TABS: 1.0000 mg | ORAL_TABLET | Freq: Two times a day (BID) | ORAL | 1 refills | Status: DC

## 2016-11-03 MED ORDER — ZIPRASIDONE HCL 60 MG PO CAPS: 60.0000 mg | ORAL_CAPSULE | Freq: Two times a day (BID) | ORAL | 1 refills | Status: DC

## 2016-11-03 NOTE — Patient Instructions (Addendum)
1. Continue ziprasidone 60 mg twice a day  2. Continue gabapentin  300 mg twice a day 3. Continue Trazodone 50 mg at night as needed for sleep 4. Continue lorazepam 1 mg three times a day as needed for anxiety 5. Continue cogentin 1 mg twice a day 6. Return to clinic in one month  7. Please obtain EKG when you see your primary care doctor
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
## 2016-11-10 ENCOUNTER — Telehealth: Payer: Self-pay | Admitting: Adult Health

## 2016-11-10 MED ORDER — ESTRADIOL 10 MCG VA TABS: ORAL_TABLET | VAGINAL | 3 refills | Status: DC

## 2016-11-10 NOTE — Telephone Encounter (Signed)
Has been using PVC and husband had rash, so she stopped it, wants to try something else, will rx  vagifem

## 2016-11-15 ENCOUNTER — Other Ambulatory Visit (INDEPENDENT_AMBULATORY_CARE_PROVIDER_SITE_OTHER): Payer: Self-pay | Admitting: Internal Medicine

## 2016-11-17 ENCOUNTER — Ambulatory Visit (INDEPENDENT_AMBULATORY_CARE_PROVIDER_SITE_OTHER): Payer: Medicare Other | Admitting: Psychiatry

## 2016-11-17 ENCOUNTER — Encounter (HOSPITAL_COMMUNITY): Payer: Self-pay | Admitting: Psychiatry

## 2016-11-17 DIAGNOSIS — F3162 Bipolar disorder, current episode mixed, moderate: Secondary | ICD-10-CM | POA: Diagnosis not present
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
## 2016-11-18 ENCOUNTER — Encounter (HOSPITAL_COMMUNITY): Payer: Self-pay | Admitting: Psychiatry

## 2016-11-18 ENCOUNTER — Ambulatory Visit (INDEPENDENT_AMBULATORY_CARE_PROVIDER_SITE_OTHER): Payer: Medicare Other | Admitting: Psychiatry

## 2016-11-18 VITALS — BP 110/58 | HR 95 | Ht 67.75 in | Wt 128.6 lb

## 2016-11-18 DIAGNOSIS — J449 Chronic obstructive pulmonary disease, unspecified: Secondary | ICD-10-CM | POA: Diagnosis not present

## 2016-11-18 DIAGNOSIS — K589 Irritable bowel syndrome without diarrhea: Secondary | ICD-10-CM

## 2016-11-18 DIAGNOSIS — Z818 Family history of other mental and behavioral disorders: Secondary | ICD-10-CM | POA: Diagnosis not present

## 2016-11-18 DIAGNOSIS — F3162 Bipolar disorder, current episode mixed, moderate: Secondary | ICD-10-CM | POA: Diagnosis not present

## 2016-11-18 DIAGNOSIS — Z87891 Personal history of nicotine dependence: Secondary | ICD-10-CM | POA: Diagnosis not present

## 2016-11-18 DIAGNOSIS — Z810 Family history of intellectual disabilities: Secondary | ICD-10-CM | POA: Diagnosis not present

## 2016-11-18 MED ORDER — GABAPENTIN 300 MG PO CAPS: 600.0000 mg | ORAL_CAPSULE | Freq: Two times a day (BID) | ORAL | 1 refills | Status: DC

## 2016-11-18 NOTE — Patient Instructions (Addendum)
1. Continue ziprasidone 60 mg twice a day 2. Increase gabapentin 600 mg twice a day 3. Continue Trazodone 50 mg at night as needed for sleep 4. Continue lorazepam 1 mg three times a day as needed for anxiety 5. Continue cogentin 1 mg twice a day 6. Return to clinic on 5/31 7. Please obtain EKG when you see your primary care doctor
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
## 2016-12-01 ENCOUNTER — Ambulatory Visit (HOSPITAL_COMMUNITY): Payer: Self-pay | Admitting: Psychiatry
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
## 2016-12-03 ENCOUNTER — Ambulatory Visit (HOSPITAL_COMMUNITY): Payer: Self-pay | Admitting: Psychiatry
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
## 2016-12-11 ENCOUNTER — Ambulatory Visit (INDEPENDENT_AMBULATORY_CARE_PROVIDER_SITE_OTHER): Payer: Medicare Other | Admitting: Psychiatry

## 2016-12-11 ENCOUNTER — Encounter (HOSPITAL_COMMUNITY): Payer: Self-pay | Admitting: Psychiatry

## 2016-12-11 VITALS — BP 118/78 | HR 81 | Ht 67.75 in | Wt 124.4 lb

## 2016-12-11 DIAGNOSIS — Z79891 Long term (current) use of opiate analgesic: Secondary | ICD-10-CM

## 2016-12-11 DIAGNOSIS — Z885 Allergy status to narcotic agent status: Secondary | ICD-10-CM

## 2016-12-11 DIAGNOSIS — F4310 Post-traumatic stress disorder, unspecified: Secondary | ICD-10-CM

## 2016-12-11 DIAGNOSIS — F3160 Bipolar disorder, current episode mixed, unspecified: Secondary | ICD-10-CM | POA: Diagnosis not present

## 2016-12-11 DIAGNOSIS — Z87891 Personal history of nicotine dependence: Secondary | ICD-10-CM

## 2016-12-11 DIAGNOSIS — Z818 Family history of other mental and behavioral disorders: Secondary | ICD-10-CM

## 2016-12-11 DIAGNOSIS — Z888 Allergy status to other drugs, medicaments and biological substances status: Secondary | ICD-10-CM

## 2016-12-11 DIAGNOSIS — Z880 Allergy status to penicillin: Secondary | ICD-10-CM

## 2016-12-11 DIAGNOSIS — K589 Irritable bowel syndrome without diarrhea: Secondary | ICD-10-CM | POA: Diagnosis not present

## 2016-12-11 DIAGNOSIS — Z79899 Other long term (current) drug therapy: Secondary | ICD-10-CM | POA: Diagnosis not present

## 2016-12-11 DIAGNOSIS — G629 Polyneuropathy, unspecified: Secondary | ICD-10-CM

## 2016-12-11 DIAGNOSIS — Z886 Allergy status to analgesic agent status: Secondary | ICD-10-CM

## 2016-12-11 DIAGNOSIS — J449 Chronic obstructive pulmonary disease, unspecified: Secondary | ICD-10-CM

## 2016-12-11 DIAGNOSIS — Z7951 Long term (current) use of inhaled steroids: Secondary | ICD-10-CM | POA: Diagnosis not present

## 2016-12-11 DIAGNOSIS — Z7982 Long term (current) use of aspirin: Secondary | ICD-10-CM | POA: Diagnosis not present

## 2016-12-11 DIAGNOSIS — F3162 Bipolar disorder, current episode mixed, moderate: Secondary | ICD-10-CM

## 2016-12-11 MED ORDER — LORAZEPAM 1 MG PO TABS: 1.0000 mg | ORAL_TABLET | Freq: Three times a day (TID) | ORAL | 0 refills | Status: DC | PRN

## 2016-12-11 MED ORDER — DULOXETINE HCL 20 MG PO CPEP: 20.0000 mg | ORAL_CAPSULE | Freq: Every day | ORAL | 1 refills | Status: DC

## 2016-12-11 MED ORDER — TRAZODONE HCL 50 MG PO TABS: 50.0000 mg | ORAL_TABLET | Freq: Every evening | ORAL | 1 refills | Status: DC | PRN

## 2016-12-11 MED ORDER — BENZTROPINE MESYLATE 1 MG PO TABS: 1.0000 mg | ORAL_TABLET | Freq: Two times a day (BID) | ORAL | 1 refills | Status: DC

## 2016-12-11 MED ORDER — ZIPRASIDONE HCL 60 MG PO CAPS: 60.0000 mg | ORAL_CAPSULE | Freq: Two times a day (BID) | ORAL | 1 refills | Status: DC

## 2016-12-11 NOTE — Patient Instructions (Addendum)
1. Continue ziprasidone 60 mg twice a day 2. Discontinue gabapentin 3. Start duloxetine 20 mg daily 4. Continue Trazodone 50 mg at night as needed for sleep 5. Continue lorazepam 1 mg three times a day as needed for an xiety 6. Continue cogentin 1 mg twice a day 7. Return to clinic in three week for 30 mins 8. Please obtain EKG when you see your primary care doctor

## 2016-12-15 ENCOUNTER — Encounter (HOSPITAL_COMMUNITY): Payer: Self-pay | Admitting: Psychiatry

## 2016-12-15 ENCOUNTER — Ambulatory Visit (INDEPENDENT_AMBULATORY_CARE_PROVIDER_SITE_OTHER): Payer: Medicare Other | Admitting: Psychiatry

## 2016-12-15 DIAGNOSIS — F319 Bipolar disorder, unspecified: Secondary | ICD-10-CM

## 2016-12-15 DIAGNOSIS — Z818 Family history of other mental and behavioral disorders: Secondary | ICD-10-CM

## 2016-12-15 DIAGNOSIS — F3162 Bipolar disorder, current episode mixed, moderate: Secondary | ICD-10-CM

## 2016-12-15 NOTE — Progress Notes (Signed)
Patient:  Kristen Ryan   DOB: August 22, 1955  MR Number: [id number]  [facility]:  [address] [place],  [place], [id number]  Start: Tuesday 12/15/2016 3:17 PM End: Tuesday 12/15/2016 4:05 PM  Provider/Observer:     Florencia ReasonsPeggy Lynnleigh Soden, MSW, LCSW   Chief Complaint:      Chief Complaint  Patient presents with  . Stress  . Anxiety    Reason For Service:     Kristen BeachColette J Riedlinger is a 61 y.o. female who is referred for services by psychiatrist Dr. Vanetta ShawlHisada. She reports having a lot of stress related to father having Alzheimer's disease. She also reports stress related to son as he has ADHD and bipolar disorder but has problems except and his condition. She says he yells at her a lot as he becomes upset taking care of her father. Patient reports a long-standing history of symptoms of bipolar disorder and schizophrenia.  Interventions Strategy:  Supportive  Participation Level:   Active  Participation Quality:  Monopolizing      Behavioral Observation:  Casual, Alert, and Appropriate.   Current Psychosocial Factors: father has Alzheimer's disease, stressful relationship with son,   Content of Session:    Reviewed symptoms, identified and discussed stressors, assisted patient identify realistic expectations of interaction with her brother and coping statements to give up unrealistic demands,  discussed effects of stress and anxiety on patient's functioning, discussed rationale for and practiced controlled breathing, assigned patient to practice 5 minutes 2 x per day.  Current Status:   Racing thoughts, restlessness, psychomotor agitation, insomnia, anxiety, excessive worry, irritability, poor concentration  Suicidal/Homicidal:    No  Patient Progress:   Poor. Patient reports less stress regarding caretaker 's abilities for her father as her son quit his job and now is providing care for patient's father. She also reports her brother informed her he does not plan to move her  father. However, patient reports continued restlessness, sleep difficulty, and excessive worry. She expresses frustration with her brother as he does not understand her illness and puts her down for taking medication per patient's report. She reports additional stress related to fears about her father's eventual death although his current condition is stable per her report. Patient continues to work withpsychiatrist Dr. Vanetta ShawlHisada for medication management.  Patient reports she has not been practicing mindfulness technique discussed in last session as she forgot about it.  Target Goals:   1.  Learn and implement calming techniques.   Last Reviewed:     Goals Addressed Today:    1,2  Plan:      Return again in 2  weeks.  Impression/Diagnosis:    Diagnosis:  Axis I:   Bipolar 1 Disorder          Axis II: Deferred [name], LCSW 12/15/2016

## 2016-12-29 ENCOUNTER — Ambulatory Visit (HOSPITAL_COMMUNITY): Payer: Self-pay | Admitting: Psychiatry
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
## 2016-12-31 ENCOUNTER — Ambulatory Visit (HOSPITAL_COMMUNITY): Payer: Self-pay | Admitting: Psychiatry

## 2016-12-31 ENCOUNTER — Ambulatory Visit (INDEPENDENT_AMBULATORY_CARE_PROVIDER_SITE_OTHER): Payer: Medicare Other | Admitting: Psychiatry

## 2016-12-31 VITALS — BP 104/76 | HR 88 | Ht 67.76 in | Wt 125.4 lb

## 2016-12-31 DIAGNOSIS — Z888 Allergy status to other drugs, medicaments and biological substances status: Secondary | ICD-10-CM

## 2016-12-31 DIAGNOSIS — F4310 Post-traumatic stress disorder, unspecified: Secondary | ICD-10-CM | POA: Diagnosis not present

## 2016-12-31 DIAGNOSIS — Z885 Allergy status to narcotic agent status: Secondary | ICD-10-CM

## 2016-12-31 DIAGNOSIS — Z7951 Long term (current) use of inhaled steroids: Secondary | ICD-10-CM | POA: Diagnosis not present

## 2016-12-31 DIAGNOSIS — Z7982 Long term (current) use of aspirin: Secondary | ICD-10-CM

## 2016-12-31 DIAGNOSIS — F3162 Bipolar disorder, current episode mixed, moderate: Secondary | ICD-10-CM

## 2016-12-31 DIAGNOSIS — J449 Chronic obstructive pulmonary disease, unspecified: Secondary | ICD-10-CM | POA: Diagnosis not present

## 2016-12-31 DIAGNOSIS — Z880 Allergy status to penicillin: Secondary | ICD-10-CM

## 2016-12-31 DIAGNOSIS — Z886 Allergy status to analgesic agent status: Secondary | ICD-10-CM

## 2016-12-31 DIAGNOSIS — Z881 Allergy status to other antibiotic agents status: Secondary | ICD-10-CM

## 2016-12-31 DIAGNOSIS — G479 Sleep disorder, unspecified: Secondary | ICD-10-CM | POA: Diagnosis not present

## 2016-12-31 DIAGNOSIS — Z79899 Other long term (current) drug therapy: Secondary | ICD-10-CM | POA: Diagnosis not present

## 2016-12-31 DIAGNOSIS — Z818 Family history of other mental and behavioral disorders: Secondary | ICD-10-CM

## 2016-12-31 DIAGNOSIS — K589 Irritable bowel syndrome without diarrhea: Secondary | ICD-10-CM

## 2016-12-31 DIAGNOSIS — G629 Polyneuropathy, unspecified: Secondary | ICD-10-CM | POA: Diagnosis not present

## 2016-12-31 DIAGNOSIS — F419 Anxiety disorder, unspecified: Secondary | ICD-10-CM | POA: Diagnosis not present

## 2016-12-31 DIAGNOSIS — Z87891 Personal history of nicotine dependence: Secondary | ICD-10-CM

## 2016-12-31 DIAGNOSIS — Z79891 Long term (current) use of opiate analgesic: Secondary | ICD-10-CM

## 2016-12-31 MED ORDER — MIRTAZAPINE 7.5 MG PO TABS: 7.5000 mg | ORAL_TABLET | Freq: Every day | ORAL | 1 refills | Status: DC

## 2016-12-31 MED ORDER — ZIPRASIDONE HCL 60 MG PO CAPS: 60.0000 mg | ORAL_CAPSULE | Freq: Two times a day (BID) | ORAL | 1 refills | Status: DC

## 2016-12-31 MED ORDER — BENZTROPINE MESYLATE 1 MG PO TABS: 1.0000 mg | ORAL_TABLET | Freq: Two times a day (BID) | ORAL | 1 refills | Status: DC

## 2016-12-31 MED ORDER — LORAZEPAM 1 MG PO TABS: 1.0000 mg | ORAL_TABLET | Freq: Three times a day (TID) | ORAL | 0 refills | Status: DC | PRN

## 2016-12-31 MED ORDER — TRAZODONE HCL 50 MG PO TABS: 50.0000 mg | ORAL_TABLET | Freq: Every evening | ORAL | 1 refills | Status: DC | PRN

## 2016-12-31 NOTE — Patient Instructions (Addendum)
1. Continue ziprasidone 60 mg twice a day 2. Discontinue duloxetine 3. Start mirtazapine 7.5 mg at night 4. Continue Trazodone 50 mg at night as needed for sleep 5. Continue lorazepam 1 mg three times a day as needed for anxiety 6. Continue cogentin 1 mg twice a day 7. Return to clinic in three week for 30 mins 8. Please obtain EKG when she sees her primary care doctor
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
## 2017-01-11 ENCOUNTER — Ambulatory Visit (INDEPENDENT_AMBULATORY_CARE_PROVIDER_SITE_OTHER): Payer: Medicare Other | Admitting: Psychiatry

## 2017-01-11 DIAGNOSIS — F3162 Bipolar disorder, current episode mixed, moderate: Secondary | ICD-10-CM | POA: Diagnosis not present

## 2017-01-11 NOTE — Progress Notes (Signed)
Patient:  Kristen Ryan   DOB: 1956/03/20  MR Number: [id number]  [facility]  [address] [place],  [place], [id number]  Start: Monday 01/11/2017 2:15 PM End: Monday 01/11/2017 3:00 PM  Provider/Observer:     Florencia ReasonsPeggy Irvin Lizama, MSW, LCSW   Chief Complaint:      Chief Complaint  Patient presents with  . Other    Bipolar Disorder    Reason For Service:     Kristen BeachColette J Celli is a 61 y.o. female who is referred for services by psychiatrist Dr. Vanetta ShawlHisada. She reports having a lot of stress related to father having Alzheimer's disease. She also reports stress related to son as he has ADHD and bipolar disorder but has problems except and his condition. She says he yells at her a lot as he becomes upset taking care of her father. Patient reports a long-standing history of symptoms of bipolar disorder and schizophrenia.  Interventions Strategy:  Supportive  Participation Level:               Active  Participation Quality:  Appropriate     Behavioral Observation:  Casual, Alert, and Appropriate.   Current Psychosocial Factors: father has Alzheimer's disease, stressful relationship with son,   Content of Session:    Reviewed symptoms, identified and discussed stressors, praise and reinforced patient's use of controlled breathing, discussed patient's strengths and supports, developed treatment plan   Current Status:   Decreased racing thoughts, improved sleep pattern, insomnia, decreased anxiety, decreased excessive worry, irritability,  Suicidal/Homicidal:    No  Patient Progress:   Fair. Patient reports being less depressed and feeling better since taking medication as prescribed by psychiatrist Dr. Vanetta ShawlHisada. She also reports improved sleep pattern. She reports practicing controlled breathing and says this has been helpful in managing her anxiety. She continues to express sadness and frustration about father who has Alzheimer's. She reports frustration with son and says she has  difficulty managing her anger  especially with her son. She reports being verbally aggressive with son.  Target Goals:   1.  Learn and implement calming techniques and coping strategies as part of an overall approach to manage and anger.    2. Verbalize an understanding of assertive communication how it can be used to express thoughts and feelings of anger and a controlled respectful  way.  Last Reviewed:   01/11/2017  Goals Addressed Today:    1,2  Plan:      Return again in 2  weeks.  Impression/Diagnosis:    Diagnosis:  Axis I:   Bipolar 1 Disorder          Axis II: Deferred Charlies Rayburn, LCSW 01/11/2017

## 2017-01-14 ENCOUNTER — Other Ambulatory Visit (HOSPITAL_COMMUNITY): Payer: Self-pay | Admitting: Family Medicine

## 2017-01-14 ENCOUNTER — Ambulatory Visit (HOSPITAL_COMMUNITY)
Admission: RE | Admit: 2017-01-14 | Discharge: 2017-01-14 | Disposition: A | Discharge status: Home / Self Care | Payer: Medicare Other | Source: Ambulatory Visit | Attending: Family Medicine | Admitting: Family Medicine

## 2017-01-14 DIAGNOSIS — S92521A Displaced fracture of medial phalanx of right lesser toe(s), initial encounter for closed fracture: Secondary | ICD-10-CM | POA: Insufficient documentation

## 2017-01-14 DIAGNOSIS — X58XXXA Exposure to other specified factors, initial encounter: Secondary | ICD-10-CM | POA: Insufficient documentation

## 2017-01-14 DIAGNOSIS — S99921A Unspecified injury of right foot, initial encounter: Secondary | ICD-10-CM | POA: Insufficient documentation

## 2017-01-14 DIAGNOSIS — T148XXA Other injury of unspecified body region, initial encounter: Secondary | ICD-10-CM
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
## 2017-01-21 ENCOUNTER — Ambulatory Visit (HOSPITAL_COMMUNITY): Payer: Self-pay | Admitting: Psychiatry

## 2017-01-21 ENCOUNTER — Encounter (HOSPITAL_COMMUNITY): Payer: Self-pay

## 2017-01-21 ENCOUNTER — Telehealth (HOSPITAL_COMMUNITY): Payer: Self-pay | Admitting: *Deleted

## 2017-01-21 NOTE — Telephone Encounter (Signed)
[facility]  Please call patient.

## 2017-01-22 ENCOUNTER — Ambulatory Visit (INDEPENDENT_AMBULATORY_CARE_PROVIDER_SITE_OTHER): Payer: Medicare Other | Admitting: Orthopedic Surgery

## 2017-01-22 ENCOUNTER — Encounter: Payer: Self-pay | Admitting: Orthopedic Surgery

## 2017-01-22 VITALS — BP 139/84 | HR 102 | Ht 67.0 in | Wt 128.0 lb

## 2017-01-22 DIAGNOSIS — S92501A Displaced unspecified fracture of right lesser toe(s), initial encounter for closed fracture: Secondary | ICD-10-CM

## 2017-01-22 NOTE — Progress Notes (Signed)
  NEW PATIENT OFFICE VISIT    Chief Complaint  Patient presents with  . New Patient (Initial Visit)    right 4th toe fracture    61 year old female presents for evaluation of a right fourth toe fracture  She hit her foot on her furniture on July 12 8 days ago complains of mild pain swelling.    Review of Systems  Neurological: Negative for tingling.     Past Medical History:  Diagnosis Date  . Anemia   . Asthma   . Bipolar 1 disorder (HCC)   . Bronchitis   . COPD (chronic obstructive pulmonary disease) (HCC)   . Diverticulitis   . IBS (irritable bowel syndrome)   . Neuropathy   . Schizophrenia (HCC)   . Thyroid disease     Past Surgical History:  Procedure Laterality Date  . ABDOMINAL HYSTERECTOMY    . broke left arm    . CESAREAN SECTION    . COLONOSCOPY N/A 08/30/2014   Procedure: COLONOSCOPY;  Surgeon: Malissa HippoNajeeb U Rehman, MD;  [facility]: [facility];  Service: Endoscopy;  Laterality: N/A;  1200  . ELBOW SURGERY    . FOOT SURGERY    . KNEE SURGERY    . TONSILLECTOMY      Family History  Problem Relation Age of Onset  . Dementia Mother   . Bipolar disorder Mother   . Dementia Father   . Other Son        MVA accident   Social History  Substance Use Topics  . Smoking status: Former Smoker    Packs/day: 4.00    Types: Cigarettes    Quit date: 07/06/2008  . Smokeless tobacco: Never Used  . Alcohol use No     Comment: 08-27-2016 per pt not no more. per pt she stopped 1987    BP 139/84   Pulse (!) 102   Ht 5\' 7"  (1.702 m)   Wt 128 lb (58.1 kg)   BMI 20.05 kg/m   Physical Exam  Constitutional: She is oriented to person, place, and time. She appears well-developed and well-nourished.  Neurological: She is alert and oriented to person, place, and time.  Psychiatric: She has a normal mood and affect.  Vitals reviewed.   Ortho Exam She walks with a cane and she has a slight limp. Her right fourth digit is swollen it's tender alignment is normal passive  range of motion pain free skin is intact and sensation is good and color is normal No orders of the defined types were placed in this encounter.   Encounter Diagnosis  Name Primary?  . Closed fracture of phalanx of right fourth toe, initial encounter Yes    X-ray shows a marginal fracture proximal aspect middle phalanx fourth digit right foot PLAN:   Continue protected weightbearing with a cane as needed and come back in 3 weeks for x-ray

## 2017-01-25 ENCOUNTER — Ambulatory Visit (HOSPITAL_COMMUNITY): Payer: Medicare Other | Admitting: Psychiatry

## 2017-01-26 NOTE — Telephone Encounter (Signed)
Spoke with pt and she was confirming her appt.

## 2017-01-27 ENCOUNTER — Ambulatory Visit (HOSPITAL_COMMUNITY): Payer: Self-pay | Admitting: Psychiatry
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
## 2017-02-01 ENCOUNTER — Ambulatory Visit (HOSPITAL_COMMUNITY): Payer: Medicare Other | Admitting: Psychiatry

## 2017-02-03 ENCOUNTER — Telehealth (HOSPITAL_COMMUNITY): Payer: Self-pay | Admitting: *Deleted

## 2017-02-03 NOTE — Telephone Encounter (Signed)
returned phone call to patient regarding an appointment.

## 2017-02-06 ENCOUNTER — Other Ambulatory Visit: Payer: Self-pay | Admitting: Adult Health

## 2017-02-10 ENCOUNTER — Encounter (HOSPITAL_COMMUNITY): Payer: Self-pay | Admitting: Psychiatry

## 2017-02-10 ENCOUNTER — Ambulatory Visit (INDEPENDENT_AMBULATORY_CARE_PROVIDER_SITE_OTHER): Payer: Medicare Other | Admitting: Psychiatry

## 2017-02-10 DIAGNOSIS — F3162 Bipolar disorder, current episode mixed, moderate: Secondary | ICD-10-CM | POA: Diagnosis not present

## 2017-02-10 NOTE — Progress Notes (Signed)
Patient:  Kristen BeachColette J Raker   DOB: 1956/06/04  MR Number: [id number]  [facility]:  [address] [place],  [place], [id number]  Start: Wednesday 02/10/2017 2:03 PM  End: Wednesday 02/10/2017 2:50 PM   Provider/Observer:     Florencia ReasonsPeggy Bynum, MSW, LCSW   Chief Complaint:      Chief Complaint  Patient presents with  . Other    Bipolar Disorder    Reason For Service:     Kristen Ryan is a 61 y.o. female who is referred for services by psychiatrist Dr. Vanetta ShawlHisada. She reports having a lot of stress related to father having Alzheimer's disease. She also reports stress related to son as he has ADHD and bipolar disorder but has problems except and his condition. She says he yells at her a lot as he becomes upset taking care of her father. Patient reports a long-standing history of symptoms of bipolar disorder and schizophrenia.  Interventions Strategy:  Supportive  Participation Level:               Active  Participation Quality:  Appropriate     Behavioral Observation:  Casual, Alert, and Talkative, restlessness,  Current Psychosocial Factors: father has Alzheimer's disease, stressful relationship with son,   Content of Session:    Reviewed symptoms, identified and discussed stressors, praised and reinforced patient's use of controlled breathing, discussed rationale for and practiced progressive muscle relaxation, provided patient with handout on progressive muscle relaxation and assigned her to practice in between sessions,discussed patient's concerns regarding medication and advised her to talk with CMA Eustaquio Boydenctavia Richards and psychiatrist Dr. Vanetta ShawlHisada regarding concerns,    Current Status:    racing thoughts, restlessness, worry, improved sleep pattern  Suicidal/Homicidal:    No  Patient Progress:   Fair. Patient reports continued decreased symptoms of depression but experiencing increased anxiety and restlessness. She is no longer taking Xanax but is taking clonazepam  instead as instructed by Dr. Vanetta ShawlHisada per patient's report. She reports clonazepam does not seem to be helping. Patient is very fidgety and restless in session today. She reports practicing controlled breathing several times a day to try to manage anxiety. She also reports some relief in caregiver responsibilities as she now has additional help for her father and no longer is having to provide care daily. She also is pleased her father will be going to an elderly daycare center 2 times per week. She reports continued stress regarding the relationship with her son as they often have arguments.    Target Goals:   1.  Learn and implement calming techniques and coping strategies as part of an overall approach to manage and anger.    2. Verbalize an understanding of assertive communication how it can be used to express thoughts and feelings of anger and a controlled respectful  way.  Last Reviewed:   01/11/2017  Goals Addressed Today:    1,2  Plan:      Return again in 2  weeks.  Impression/Diagnosis:    Diagnosis:  Axis I:   Bipolar 1 Disorder          Axis II: Deferred BYNUM,PEGGY, LCSW 02/10/2017

## 2017-02-11 ENCOUNTER — Other Ambulatory Visit: Payer: Self-pay | Admitting: Radiology

## 2017-02-11 DIAGNOSIS — S92501D Displaced unspecified fracture of right lesser toe(s), subsequent encounter for fracture with routine healing: Secondary | ICD-10-CM

## 2017-02-12 ENCOUNTER — Ambulatory Visit: Payer: Self-pay | Admitting: Orthopedic Surgery

## 2017-02-12 ENCOUNTER — Other Ambulatory Visit: Payer: Self-pay

## 2017-02-15 ENCOUNTER — Ambulatory Visit (INDEPENDENT_AMBULATORY_CARE_PROVIDER_SITE_OTHER): Payer: Medicare Other

## 2017-02-15 ENCOUNTER — Encounter: Payer: Self-pay | Admitting: Orthopedic Surgery

## 2017-02-15 ENCOUNTER — Ambulatory Visit (INDEPENDENT_AMBULATORY_CARE_PROVIDER_SITE_OTHER): Payer: Medicare Other | Admitting: Orthopedic Surgery

## 2017-02-15 DIAGNOSIS — M7711 Lateral epicondylitis, right elbow: Secondary | ICD-10-CM | POA: Diagnosis not present

## 2017-02-15 DIAGNOSIS — M25521 Pain in right elbow: Secondary | ICD-10-CM

## 2017-02-15 DIAGNOSIS — S92501D Displaced unspecified fracture of right lesser toe(s), subsequent encounter for fracture with routine healing: Secondary | ICD-10-CM | POA: Diagnosis not present
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
## 2017-02-15 NOTE — Progress Notes (Signed)
Fracture care follow-up  Chief Complaint  Patient presents with  . Fracture    4th toe follow up   . Elbow Injury    hit right elbow on door frame yesterday painful ROM     Post injury day number (DOI July 12) 32  Fracture treated with  TAPING AND WBAT   X-rays today show FRACTURE HEALING WITH NEAR NORMAL ALIGNMENT   Current Outpatient Prescriptions:  .  albuterol (PROVENTIL) (2.5 MG/3ML) 0.083% nebulizer solution, Take 3 mLs (2.5 mg total) by nebulization every 6 (six) hours as needed for wheezing., Disp: 75 mL, Rfl: 12 .  Albuterol Sulfate (PROVENTIL HFA IN), Inhale 1-2 puffs into the lungs daily. , Disp: , Rfl:  .  aspirin 325 MG tablet, Take 325 mg by mouth daily., Disp: , Rfl:  .  benztropine (COGENTIN) 1 MG tablet, Take 1 tablet (1 mg total) by mouth 2 (two) times daily., Disp: 60 tablet, Rfl: 1 .  budesonide-formoterol (SYMBICORT) 160-4.5 MCG/ACT inhaler, Inhale 2 puffs into the lungs 2 (two) times daily. (Patient not taking: Reported on 01/22/2017), Disp: 1 Inhaler, Rfl: 1 .  cetirizine (ZYRTEC) 10 MG tablet, Take 10 mg by mouth daily., Disp: , Rfl:  .  Cholecalciferol (VITAMIN D-3) 5000 units TABS, Take by mouth 2 (two) times daily., Disp: , Rfl:  .  dicyclomine (BENTYL) 10 MG capsule, TAKE 1 CAPSULE BY MOUTH THREE TIMES DAILY AS NEEDED FOR SPASMS., Disp: 90 capsule, Rfl: 5 .  levothyroxine (SYNTHROID, LEVOTHROID) 50 MCG tablet, Take 50 mcg by mouth daily., Disp: , Rfl:  .  LORazepam (ATIVAN) 1 MG tablet, Take 1 tablet (1 mg total) by mouth every 8 (eight) hours as needed for anxiety., Disp: 90 tablet, Rfl: 0 .  mirtazapine (REMERON) 7.5 MG tablet, Take 1 tablet (7.5 mg total) by mouth at bedtime., Disp: 30 tablet, Rfl: 1 .  Multiple Vitamin (MULTIVITAMIN WITH MINERALS) TABS tablet, Take 1 tablet by mouth daily., Disp: , Rfl:  .  Oxycodone HCl 10 MG TABS, Take by mouth 4 (four) times daily., Disp: , Rfl:  .  Selenium 200 MCG TABS, Take by mouth daily., Disp: , Rfl:  .   simvastatin (ZOCOR) 40 MG tablet, Take 1 tablet by mouth daily., Disp: , Rfl:  .  sodium chloride (MURO 128) 5 % ophthalmic solution, Place 3 drops into both eyes as needed for irritation., Disp: , Rfl:  .  traZODone (DESYREL) 50 MG tablet, Take 1 tablet (50 mg total) by mouth at bedtime as needed for sleep., Disp: 30 tablet, Rfl: 1 .  YUVAFEM 10 MCG TABS vaginal tablet, INSERT 1 TABLET VAGINALLY AT BEDTIME FOR 2 WEEKS; THEN CONTINUE TWICEWEEKLY., Disp: 8 tablet, Rfl: 6 .  ziprasidone (GEODON) 60 MG capsule, Take 1 capsule (60 mg total) by mouth 2 (two) times daily., Disp: 60 capsule, Rfl: 1   Clinical exam  NO TENDERNESS BUT THE TOE IS STILL SWOLLEN (HER DOG STEPPED ON IT YESTERDAY), ALIGNMENT IS NORMAL   Encounter Diagnoses  Name Primary?  . Closed fracture of phalanx of right fourth toe with routine healing, subsequent encounter Yes  . Right elbow pain   . Lateral epicondylitis of right elbow     Plan  NORMAL ACTIVITY

## 2017-02-15 NOTE — Addendum Note (Signed)
Addended by: Vickki HearingHARRISON, Taye Cato E on: 02/15/2017 09:53 PM   Modules accepted: Level of Service

## 2017-02-16 ENCOUNTER — Telehealth: Payer: Self-pay | Admitting: Orthopedic Surgery

## 2017-02-16 NOTE — Telephone Encounter (Signed)
Patient called today, 02/16/17, following office visit with Dr Romeo AppleHarrison yesterday, 02/15/17, relaying that she injured another toe on same (right foot) - "pinky toe."  [place] SpanielSaid was sweeping last night.  States tried to have her primary care order an Xray and was advised to call back to our office.  Asking if Dr Romeo AppleHarrison would order Xray at [facility]?  Patient's phone number is [phone]

## 2017-02-16 NOTE — Telephone Encounter (Signed)
Routing to dr harrison 

## 2017-02-16 NOTE — Telephone Encounter (Signed)
Yes but it will cost her a lot of money to get xrays at the hospital
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
## 2017-02-17 NOTE — Telephone Encounter (Signed)
Advised to see dr Hilda Liaskeeling tomorrow

## 2017-02-18 ENCOUNTER — Ambulatory Visit (HOSPITAL_COMMUNITY): Payer: Self-pay | Admitting: Psychiatry

## 2017-02-18 ENCOUNTER — Ambulatory Visit: Payer: Medicare Other | Admitting: Orthopaedic Surgery

## 2017-02-18 ENCOUNTER — Encounter: Payer: Self-pay | Admitting: Orthopaedic Surgery

## 2017-02-24 ENCOUNTER — Ambulatory Visit (INDEPENDENT_AMBULATORY_CARE_PROVIDER_SITE_OTHER): Payer: Medicare Other | Admitting: Psychiatry

## 2017-02-24 ENCOUNTER — Encounter (HOSPITAL_COMMUNITY): Payer: Self-pay | Admitting: Psychiatry

## 2017-02-24 VITALS — BP 111/71 | HR 88 | Ht 67.76 in | Wt 126.2 lb

## 2017-02-24 DIAGNOSIS — Z818 Family history of other mental and behavioral disorders: Secondary | ICD-10-CM | POA: Diagnosis not present

## 2017-02-24 DIAGNOSIS — F3162 Bipolar disorder, current episode mixed, moderate: Secondary | ICD-10-CM

## 2017-02-24 DIAGNOSIS — G629 Polyneuropathy, unspecified: Secondary | ICD-10-CM

## 2017-02-24 DIAGNOSIS — K589 Irritable bowel syndrome without diarrhea: Secondary | ICD-10-CM

## 2017-02-24 DIAGNOSIS — F419 Anxiety disorder, unspecified: Secondary | ICD-10-CM | POA: Diagnosis not present

## 2017-02-24 DIAGNOSIS — F410 Panic disorder [episodic paroxysmal anxiety] without agoraphobia: Secondary | ICD-10-CM | POA: Diagnosis not present

## 2017-02-24 DIAGNOSIS — Z87891 Personal history of nicotine dependence: Secondary | ICD-10-CM | POA: Diagnosis not present

## 2017-02-24 DIAGNOSIS — J449 Chronic obstructive pulmonary disease, unspecified: Secondary | ICD-10-CM | POA: Diagnosis not present

## 2017-02-24 DIAGNOSIS — Z810 Family history of intellectual disabilities: Secondary | ICD-10-CM | POA: Diagnosis not present

## 2017-02-24 MED ORDER — MIRTAZAPINE 15 MG PO TABS: 15.0000 mg | ORAL_TABLET | Freq: Every day | ORAL | 1 refills | Status: DC

## 2017-02-24 MED ORDER — BENZTROPINE MESYLATE 1 MG PO TABS: 1.0000 mg | ORAL_TABLET | Freq: Two times a day (BID) | ORAL | 1 refills | Status: DC

## 2017-02-24 MED ORDER — ZIPRASIDONE HCL 60 MG PO CAPS: 60.0000 mg | ORAL_CAPSULE | Freq: Two times a day (BID) | ORAL | 1 refills | Status: DC

## 2017-02-24 MED ORDER — TRAZODONE HCL 50 MG PO TABS: 50.0000 mg | ORAL_TABLET | Freq: Every evening | ORAL | 1 refills | Status: DC | PRN

## 2017-02-24 MED ORDER — HYDROXYZINE HCL 25 MG PO TABS: 25.0000 mg | ORAL_TABLET | Freq: Three times a day (TID) | ORAL | 1 refills | Status: DC | PRN
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
## 2017-02-24 NOTE — Patient Instructions (Addendum)
1. Increase mirtazapine 15 mg at night 2. Continue ziprasidone 60 mg twice a day 3. Continue Trazodone 50 mg at night as needed for sleep 4. Discontinue ativan 5. Start hydroxyzine 25 mg three times a day as needed for anxiety 6. Continue cogentin 1 mg twice a day 7. Return to clinic in one month for 30 mins 8. Please obtain EKG when you see your doctor to check QTc prolongation

## 2017-03-03 ENCOUNTER — Encounter (HOSPITAL_COMMUNITY): Payer: Self-pay | Admitting: Psychiatry

## 2017-03-03 ENCOUNTER — Ambulatory Visit (INDEPENDENT_AMBULATORY_CARE_PROVIDER_SITE_OTHER): Payer: Medicare Other | Admitting: Psychiatry

## 2017-03-03 DIAGNOSIS — F3162 Bipolar disorder, current episode mixed, moderate: Secondary | ICD-10-CM | POA: Diagnosis not present

## 2017-03-03 NOTE — Progress Notes (Signed)
Patient:  Kristen Ryan   DOB: September 08, 1955  MR Number: [id number]  Location: [facility]:  [address] [place],  [place], [address]  Start: Wednesday 03/03/2017 11:12 AM End: Wednesday 03/03/2017 11:57 AM  Provider/Observer:     Florencia Reasons, MSW, LCSW   Chief Complaint:      Chief Complaint  Patient presents with  . Other    Bipolar Disorder    Reason For Service:     [name] is a 61 y.o. female who is referred for services by psychiatrist Dr. Vanetta Shawl. She reports having a lot of stress related to father having Alzheimer's disease. She also reports stress related to son as he has ADHD and bipolar disorder but has problems except and his condition. She says he yells at her a lot as he becomes upset taking care of her father. Patient reports a long-standing history of symptoms of bipolar disorder and schizophrenia.  Interventions Strategy:  Supportive/CBT/  Participation Level:               Active  Participation Quality:  Appropriate     Behavioral Observation:  Casual, Alert, and Talkative, restlessness  Current Psychosocial Factors: father has Alzheimer's disease, stressful relationship with son,   Content of Session:    Reviewed symptoms, praised and reinforced patient's use of controlled breathing, praised and reinforced patient's practice of  progressive muscle relaxation, Facilitate expression of thoughts and feelings regarding her interaction/relationship with her son, assisted patient identify her demands regarding relationship and replace with realistic expectations, discussed patient's early signs of anger, discussed and practice techniques to reduce anger including deep breathing, self statements, prayer, and counting.   Current Status:    racing thoughts, restlessness, worry, improved sleep pattern  Suicidal/Homicidal:    No  Patient Progress:   Fair. Patient reports continued decreased symptoms of depression but experiencing continued anxiety and  restlessness. She reports taking a different medication as prescribed by Dr. Vanetta Shawl during last visit but says this medication still isn't helping. She will discuss with Dr. Vanetta Shawl.  Patient remains very fidgety and restless in session today. She reports being pleased with her father being in a senior daycare center 2 times per week and says this is going well. She reports increased frustration and anger with her son regarding his housekeeping responsibilities for her father's home.   Target Goals:   1.  Learn and implement calming techniques and coping strategies as part of an overall approach to managing anger.    2. Verbalize an understanding of assertive communication how it can be used to express thoughts and feelings of anger in a controlled respectful  way.  Last Reviewed:   01/11/2017  Goals Addressed Today:    1,2  Plan:      Return again in 2  weeks.  Impression/Diagnosis:    Diagnosis:  Axis I:   Bipolar 1 Disorder          Axis II: Deferred Brownie Nehme, LCSW 03/03/2017

## 2017-03-17 ENCOUNTER — Telehealth (HOSPITAL_COMMUNITY): Payer: Self-pay | Admitting: *Deleted

## 2017-03-17 NOTE — Telephone Encounter (Signed)
returned phone call to patient, her appointment for 03/24/17 has been cancelled as she requested.   please call to reschedule appointment.
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
## 2017-03-22 ENCOUNTER — Encounter (HOSPITAL_COMMUNITY): Payer: Self-pay | Admitting: Psychiatry

## 2017-03-22 ENCOUNTER — Ambulatory Visit (INDEPENDENT_AMBULATORY_CARE_PROVIDER_SITE_OTHER): Payer: Medicare Other | Admitting: Psychiatry

## 2017-03-22 VITALS — BP 93/65 | HR 90 | Ht 67.0 in | Wt 124.0 lb

## 2017-03-22 DIAGNOSIS — G4700 Insomnia, unspecified: Secondary | ICD-10-CM | POA: Diagnosis not present

## 2017-03-22 DIAGNOSIS — F3162 Bipolar disorder, current episode mixed, moderate: Secondary | ICD-10-CM

## 2017-03-22 DIAGNOSIS — F4310 Post-traumatic stress disorder, unspecified: Secondary | ICD-10-CM | POA: Diagnosis not present

## 2017-03-22 DIAGNOSIS — Z87891 Personal history of nicotine dependence: Secondary | ICD-10-CM | POA: Diagnosis not present

## 2017-03-22 DIAGNOSIS — F410 Panic disorder [episodic paroxysmal anxiety] without agoraphobia: Secondary | ICD-10-CM

## 2017-03-22 DIAGNOSIS — Z818 Family history of other mental and behavioral disorders: Secondary | ICD-10-CM | POA: Diagnosis not present

## 2017-03-22 DIAGNOSIS — Z79899 Other long term (current) drug therapy: Secondary | ICD-10-CM

## 2017-03-22 MED ORDER — TRAZODONE HCL 50 MG PO TABS: 50.0000 mg | ORAL_TABLET | Freq: Every evening | ORAL | 1 refills | Status: DC | PRN

## 2017-03-22 MED ORDER — LORAZEPAM 1 MG PO TABS: 1.0000 mg | ORAL_TABLET | Freq: Three times a day (TID) | ORAL | 0 refills | Status: DC | PRN

## 2017-03-22 MED ORDER — MIRTAZAPINE 30 MG PO TABS: 30.0000 mg | ORAL_TABLET | Freq: Every day | ORAL | 1 refills | Status: DC

## 2017-03-22 MED ORDER — ZIPRASIDONE HCL 60 MG PO CAPS: 60.0000 mg | ORAL_CAPSULE | Freq: Two times a day (BID) | ORAL | 1 refills | Status: DC

## 2017-03-22 MED ORDER — BENZTROPINE MESYLATE 1 MG PO TABS: 1.0000 mg | ORAL_TABLET | Freq: Two times a day (BID) | ORAL | 1 refills | Status: DC

## 2017-03-22 NOTE — Patient Instructions (Signed)
1. Increase mirtazapine 30 mg at night 2. Continue ziprasidone 60 mg twice a day  3. Continue Trazodone 50 mg at night as needed for sleep 4. Continue lorazepam 1 mg three times a day as needed for anxiety 5. Discontinue hydroxyzine 6. Return to clinic in one month for 30 mins

## 2017-03-23 ENCOUNTER — Ambulatory Visit (INDEPENDENT_AMBULATORY_CARE_PROVIDER_SITE_OTHER): Payer: Medicare Other | Admitting: Psychiatry

## 2017-03-23 ENCOUNTER — Encounter (HOSPITAL_COMMUNITY): Payer: Self-pay | Admitting: Psychiatry

## 2017-03-23 DIAGNOSIS — F3162 Bipolar disorder, current episode mixed, moderate: Secondary | ICD-10-CM | POA: Diagnosis not present

## 2017-03-23 NOTE — Progress Notes (Signed)
Patient:  Kristen Ryan   DOB: [birth year]  MR Number: [id number]  Location: [facility]:  [address] [place] [place],  [place], [address]  Start: Tuesday 03/23/2017 2:08 PM  End: Tuesday 03/23/2017 2:52 PM  Provider/Observer:     Florencia Reasons, MSW, LCSW   Chief Complaint:      Chief Complaint  Patient presents with  . Stress  . Anxiety    Reason For Service:     [name] is a 61 y.o. female who is referred for services by psychiatrist Dr. Vanetta Shawl. She reports having a lot of stress related to father having Alzheimer's disease. She also reports stress related to son as he has ADHD and bipolar disorder but has problems except and his condition. She says he yells at her a lot as he becomes upset taking care of her father. Patient reports a long-standing history of symptoms of bipolar disorder and schizophrenia.  Interventions Strategy:  Supportive/CBT/  Participation Level:               Active  Participation Quality:  Appropriate     Behavioral Observation:  Casual, Alert, and Talkative, restlessness  Current Psychosocial Factors: father has Alzheimer's disease, stressful relationship with son,   Content of Session:    Reviewed symptoms, facilitated expression of thoughts and feelings, assisted patient to try to identify triggers of increased depressed mood/tearfulness/ psychotic symptoms, assisted patient identify her emotional and behavioral reactions as well as other consequences of psychotic symptoms, allowed patient to talk about her hallucinations to try to desensitize patient's fear, assisted patient identify coping strategies to manage hallucinations including calming techniques, attention switching and realistic self talk, using her support system  Current Status:    racing thoughts, restlessness, worry,   Suicidal/Homicidal:    No  Patient Progress:   Fair. Patient reports experiencing increased depressed mood, tearfulness, and auditory/visual hallucinations  in the past 2 weeks. She denies any command hallucinations. She sometimes referred to the hallucinations as different personalities. She reports these personalities sometimes have caused her to walk in her sleep. She reports seeing Dr. Vanetta Shawl yesterday and being prescribed a different medication that improved her sleep pattern last night. She reports improved interaction in the relationship with her son and having more realistic expectations of son.   Target Goals:   1.  Learn and implement calming techniques and coping strategies as part of an overall approach to managing anger.    2. Verbalize an understanding of assertive communication how it can be used to express thoughts and feelings of anger in a controlled respectful  way.  Last Reviewed:   01/11/2017  Goals Addressed Today:    1,2  Plan:      Return again in 2  weeks.  Impression/Diagnosis:    Diagnosis:  Axis I:   Bipolar 1 Disorder          Axis II: Deferred Thressa Shiffer, LCSW 03/23/2017

## 2017-03-24 ENCOUNTER — Ambulatory Visit (HOSPITAL_COMMUNITY): Payer: Self-pay | Admitting: Psychiatry

## 2017-03-29 ENCOUNTER — Telehealth (HOSPITAL_COMMUNITY): Payer: Self-pay | Admitting: *Deleted

## 2017-03-29 NOTE — Telephone Encounter (Signed)
Pt called stating her anti depressant is not working. Per pt she broke out in a really bad rash. Per pt she is also experiencing crying spells that she can not control. Per pt she googled that her Remeron cause rash. Per pt, due to how bad her rash was, she only took 15 mg last night. Per pt she would like to have Dr. Vanetta Shawl call her back to discuss as to what she needs to do about this crying spells and rash. Pt number is [phone]. Per pt she is not suicidal.

## 2017-03-29 NOTE — Telephone Encounter (Signed)
If her rash improves after decreasing mirtazapine to 15 mg, advise her to stay on that dose. If any worsening in rash despite decreasing the dose, advise her to discontinue mirtazapine. Also advise her to see urgent care if her rash worsens (having blisters) and/or having diarrhea, shortness of breath. Will discuss other option at her next visit next week.

## 2017-03-30 NOTE — Telephone Encounter (Signed)
Called pt and informed her with what provider stated and pt verbalized understanding.  
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
## 2017-04-06 ENCOUNTER — Ambulatory Visit (INDEPENDENT_AMBULATORY_CARE_PROVIDER_SITE_OTHER): Payer: Medicare Other | Admitting: Psychiatry

## 2017-04-06 VITALS — BP 136/84 | HR 92 | Ht 67.0 in | Wt 125.0 lb

## 2017-04-06 DIAGNOSIS — Z91419 Personal history of unspecified adult abuse: Secondary | ICD-10-CM | POA: Diagnosis not present

## 2017-04-06 DIAGNOSIS — K589 Irritable bowel syndrome without diarrhea: Secondary | ICD-10-CM

## 2017-04-06 DIAGNOSIS — Z818 Family history of other mental and behavioral disorders: Secondary | ICD-10-CM | POA: Diagnosis not present

## 2017-04-06 DIAGNOSIS — J449 Chronic obstructive pulmonary disease, unspecified: Secondary | ICD-10-CM

## 2017-04-06 DIAGNOSIS — F4310 Post-traumatic stress disorder, unspecified: Secondary | ICD-10-CM

## 2017-04-06 DIAGNOSIS — G629 Polyneuropathy, unspecified: Secondary | ICD-10-CM | POA: Diagnosis not present

## 2017-04-06 DIAGNOSIS — Z79899 Other long term (current) drug therapy: Secondary | ICD-10-CM

## 2017-04-06 DIAGNOSIS — G4700 Insomnia, unspecified: Secondary | ICD-10-CM

## 2017-04-06 DIAGNOSIS — Z87891 Personal history of nicotine dependence: Secondary | ICD-10-CM | POA: Diagnosis not present

## 2017-04-06 DIAGNOSIS — F3162 Bipolar disorder, current episode mixed, moderate: Secondary | ICD-10-CM

## 2017-04-06 MED ORDER — LORAZEPAM 1 MG PO TABS: 1.0000 mg | ORAL_TABLET | Freq: Three times a day (TID) | ORAL | 0 refills | Status: DC | PRN

## 2017-04-06 MED ORDER — TRAZODONE HCL 50 MG PO TABS: 50.0000 mg | ORAL_TABLET | Freq: Every evening | ORAL | 1 refills | Status: DC | PRN

## 2017-04-06 MED ORDER — BENZTROPINE MESYLATE 0.5 MG PO TABS: 1.0000 mg | ORAL_TABLET | Freq: Two times a day (BID) | ORAL | 1 refills | Status: DC

## 2017-04-06 NOTE — Patient Instructions (Addendum)
1. Decrease ziprasidone 60 mg daily for one week then discontinue 2. Start vraylar 1.5 mg one day, then 3 mg daily 3. Continue Trazodone 50 mg as needed for sleep 4. Continue lorazepam 1 mg three times a day as needed for anxiety 5. Decrease benztropine 0.5 mg twice a day 6. Return to clinic in one month for 30 mins

## 2017-04-14 ENCOUNTER — Encounter (HOSPITAL_COMMUNITY): Payer: Self-pay | Admitting: Psychiatry

## 2017-04-14 ENCOUNTER — Ambulatory Visit (INDEPENDENT_AMBULATORY_CARE_PROVIDER_SITE_OTHER): Payer: Medicare Other | Admitting: Psychiatry

## 2017-04-14 DIAGNOSIS — F3162 Bipolar disorder, current episode mixed, moderate: Secondary | ICD-10-CM

## 2017-04-14 NOTE — Progress Notes (Signed)
Patient:  Kristen Ryan   DOB: 14-May-1956  MR Number: [id number]  [facility]:  [address] [place] [place],  [place], [address]  Start: Wednesday 04/14/2017 2:12 PM  End: Wednesday 04/14/2017 2:40 PM  Provider/Observer:     Florencia Reasons, MSW, LCSW   Chief Complaint:      Chief Complaint  Patient presents with  . Other    Bipolar Disorder    Reason For Service:     SUSSAN METER is a 61 y.o. female who is referred for services by psychiatrist Dr. Vanetta Shawl. She reports having a lot of stress related to father having Alzheimer's disease. She also reports stress related to son as he has ADHD and bipolar disorder but has problems except and his condition. She says he yells at her a lot as he becomes upset taking care of her father. Patient reports a long-standing history of symptoms of bipolar disorder and schizophrenia.  Interventions Strategy:  Supportive/CBT/  Participation Level:               Active  Participation Quality:  Appropriate     Behavioral Observation:  Casual, Alert, and lethargic, leg dhakes constantly  Current Psychosocial Factors: father has Alzheimer's disease, stressful relationship with son,   Content of Session:    Reviewed symptoms, facilitated expression of thoughts and feelings, Discussed patient's concerns regarding medication and advise patient to talk with CMA Eustaquio Boyden and psychiatrist Dr. Vanetta Shawl, assisted patient identify ways to improve self-care, discuss rationale for and practice a mindfulness technique using breath awareness, assigned patient to practice in between sessions assisted patient to try to identify triggers of increased depressed mood/tearfulness/ psychotic symptoms, assisted patient identify her emotional and behavioral reactions as well as other consequences of psychotic symptoms, allowed patient to talk about her hallucinations to try to desensitize patient's fear, assisted patient identify coping strategies to  manage hallucinations including calming techniques, attention switching and realistic self talk, using her support system  Current Status:    racing thoughts, restlessness, worry,   Suicidal/Homicidal:    No  Patient Progress:   Fair. Patient last was seen 3 weeks ago. She reports negative reaction to beginning to take Vraylar last week. Per patient's report, she is experiencing severe headaches, nausea, loss of appetite dizziness, fatigue, loss of energy, diarrhea, and sleep difficulty (sleeps only 3 hours per night). She reports ruminating thoughts at night. She says she does nothing but staying in bed most of the time due to lack of energy. Patient agrees to talk with CMA Eustaquio Boyden and psychiatrist Dr. Vanetta Shawl regarding medication issues. Patient reports everything else in her life is going well but expresses frustration and fear regarding her body's reaction. She reports continued auditory/visual hallucinations but denies any command hallucinations. She denies being stressed by these hallucinations.   Target Goals:   1.  Learn and implement calming techniques and coping strategies as part of an overall approach to managing anger.    2. Verbalize an understanding of assertive communication how it can be used to express thoughts and feelings of anger in a controlled respectful  way.  Last Reviewed:   01/11/2017  Goals Addressed Today:    1,2  Plan:      Return again in 2  weeks.  Impression/Diagnosis:    Diagnosis:  Axis I:   Bipolar 1 Disorder          Axis II: Deferred Jerica Creegan, LCSW 04/14/2017
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
## 2017-04-16 ENCOUNTER — Ambulatory Visit (INDEPENDENT_AMBULATORY_CARE_PROVIDER_SITE_OTHER): Payer: Medicare Other | Admitting: Psychiatry

## 2017-04-16 ENCOUNTER — Encounter (HOSPITAL_COMMUNITY): Payer: Self-pay | Admitting: Psychiatry

## 2017-04-16 VITALS — BP 125/70 | HR 86 | Ht 67.0 in | Wt 125.0 lb

## 2017-04-16 DIAGNOSIS — Z87891 Personal history of nicotine dependence: Secondary | ICD-10-CM | POA: Diagnosis not present

## 2017-04-16 DIAGNOSIS — Z79899 Other long term (current) drug therapy: Secondary | ICD-10-CM

## 2017-04-16 DIAGNOSIS — F3162 Bipolar disorder, current episode mixed, moderate: Secondary | ICD-10-CM

## 2017-04-16 DIAGNOSIS — F4310 Post-traumatic stress disorder, unspecified: Secondary | ICD-10-CM

## 2017-04-16 DIAGNOSIS — Z818 Family history of other mental and behavioral disorders: Secondary | ICD-10-CM

## 2017-04-16 DIAGNOSIS — G629 Polyneuropathy, unspecified: Secondary | ICD-10-CM

## 2017-04-16 DIAGNOSIS — G4700 Insomnia, unspecified: Secondary | ICD-10-CM | POA: Diagnosis not present

## 2017-04-16 DIAGNOSIS — K589 Irritable bowel syndrome without diarrhea: Secondary | ICD-10-CM

## 2017-04-16 DIAGNOSIS — Z91419 Personal history of unspecified adult abuse: Secondary | ICD-10-CM | POA: Diagnosis not present

## 2017-04-16 DIAGNOSIS — J449 Chronic obstructive pulmonary disease, unspecified: Secondary | ICD-10-CM | POA: Diagnosis not present

## 2017-04-16 MED ORDER — ZIPRASIDONE HCL 60 MG PO CAPS: 60.0000 mg | ORAL_CAPSULE | Freq: Two times a day (BID) | ORAL | 1 refills | Status: DC

## 2017-04-16 NOTE — Patient Instructions (Signed)
1. Continue ziprasidone 60 mg twice a day  (QTc 450 msec on 03/16/2017) 2. Discontinue vraylar 3. Continue lorazepam 1 mg three times a day as needed for anxiety  4. Continue benztropine 0.5 mg twice a day  5. Return to clinic in one month for 30 mins

## 2017-04-21 ENCOUNTER — Ambulatory Visit (HOSPITAL_COMMUNITY): Payer: Self-pay | Admitting: Psychiatry

## 2017-04-29 ENCOUNTER — Encounter (HOSPITAL_COMMUNITY): Payer: Self-pay | Admitting: Psychiatry

## 2017-04-29 ENCOUNTER — Ambulatory Visit (INDEPENDENT_AMBULATORY_CARE_PROVIDER_SITE_OTHER): Payer: Medicare Other | Admitting: Psychiatry

## 2017-04-29 DIAGNOSIS — F3162 Bipolar disorder, current episode mixed, moderate: Secondary | ICD-10-CM

## 2017-04-29 DIAGNOSIS — F319 Bipolar disorder, unspecified: Secondary | ICD-10-CM | POA: Diagnosis not present

## 2017-04-29 NOTE — Progress Notes (Signed)
Patient:  Kristen Ryan   DOB: 08-13-1955  MR Number: [id number]  [facility]:  [address] [place],  [place], [id number]  Start: Thursday 04/29/2017 11:17 AM  End: Thursday 04/29/2017 11:57 AM   Provider/Observer:     Florencia ReasonsPeggy Carrington Olazabal, MSW, LCSW   Chief Complaint:      Chief Complaint  Patient presents with  . Other    Bipolar Disorder    Reason For Service:     Kristen Ryan is a 61 y.o. female who is referred for services by psychiatrist Dr. Vanetta ShawlHisada. She reports having a lot of stress related to father having Alzheimer's disease. She also reports stress related to son as he has ADHD and bipolar disorder but has problems except and his condition. She says he yells at her a lot as he becomes upset taking care of her father. Patient reports a long-standing history of symptoms of bipolar disorder and schizophrenia.  Interventions Strategy:  Supportive/CBT/  Participation Level:               Active  Participation Quality:  Appropriate     Behavioral Observation:  Casual, Alert, and talkative  Current Psychosocial Factors: father has Alzheimer's disease, stressful relationship with son,   Content of Session:    Reviewed symptoms, facilitated expression of thoughts and feelings, assisted patient identify her thoughts about experiencing depressive symptoms/way she is relating to her thoughts/effects on her behavior, assisted patient identify values and interests to determine ways to increase behavioral activation, assisted patient identify thoughts and process that may inhibit her plan to pursue activity, discussed hallucinations patient has been experiencing and reviewed coping strategies to manage including calming techniques, attention switching and realistic self talk, using her support system  Current Status:    racing thoughts, restlessness, worry,   Suicidal/Homicidal:    No  Patient Progress:   Fair. Patient last was seen 2- 3 weeks ago. She reports she  continues to experience frequent crying spells, poor motivation, and fatigue. She expresses frustration with self due to experiencing depressive symptoms. She states feeling as though she cannot do anything until the depression passes. She reports continued auditory and visual hallucinations but denies any command hallucinations. Patient is scheduled to see psychiatrist Dr.Hisada  on 05/04/2017.  Target Goals:   1.  Learn and implement calming techniques and coping strategies as part of an overall approach to managing anger.    2. Verbalize an understanding of assertive communication how it can be used to express thoughts and feelings of anger in a controlled respectful  way.  Last Reviewed:   01/11/2017  Goals Addressed Today:      Plan:      Return again in 2  weeks.  Impression/Diagnosis:    Diagnosis:  Axis I:   Bipolar 1 Disorder          Axis II: Deferred Ciaran Begay, LCSW 04/29/2017

## 2017-05-04 ENCOUNTER — Ambulatory Visit (HOSPITAL_COMMUNITY): Payer: Self-pay | Admitting: Psychiatry

## 2017-05-12 ENCOUNTER — Ambulatory Visit (HOSPITAL_COMMUNITY): Payer: Self-pay | Admitting: Psychiatry
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
## 2017-05-13 ENCOUNTER — Ambulatory Visit (HOSPITAL_COMMUNITY): Payer: Self-pay | Admitting: Psychiatry

## 2017-05-17 ENCOUNTER — Ambulatory Visit (HOSPITAL_COMMUNITY): Payer: Self-pay | Admitting: Psychiatry

## 2017-05-19 ENCOUNTER — Encounter (HOSPITAL_COMMUNITY): Payer: Self-pay | Admitting: Psychiatry

## 2017-05-19 ENCOUNTER — Ambulatory Visit (INDEPENDENT_AMBULATORY_CARE_PROVIDER_SITE_OTHER): Payer: Medicare Other | Admitting: Psychiatry

## 2017-05-19 VITALS — BP 132/84 | HR 99 | Ht 67.0 in | Wt 132.0 lb

## 2017-05-19 DIAGNOSIS — R450 Nervousness: Secondary | ICD-10-CM

## 2017-05-19 DIAGNOSIS — Z818 Family history of other mental and behavioral disorders: Secondary | ICD-10-CM

## 2017-05-19 DIAGNOSIS — Z6379 Other stressful life events affecting family and household: Secondary | ICD-10-CM

## 2017-05-19 DIAGNOSIS — F3162 Bipolar disorder, current episode mixed, moderate: Secondary | ICD-10-CM

## 2017-05-19 DIAGNOSIS — R443 Hallucinations, unspecified: Secondary | ICD-10-CM

## 2017-05-19 DIAGNOSIS — F419 Anxiety disorder, unspecified: Secondary | ICD-10-CM | POA: Diagnosis not present

## 2017-05-19 DIAGNOSIS — Z810 Family history of intellectual disabilities: Secondary | ICD-10-CM | POA: Diagnosis not present

## 2017-05-19 DIAGNOSIS — Z87891 Personal history of nicotine dependence: Secondary | ICD-10-CM | POA: Diagnosis not present

## 2017-05-19 DIAGNOSIS — F4310 Post-traumatic stress disorder, unspecified: Secondary | ICD-10-CM

## 2017-05-19 NOTE — Patient Instructions (Signed)
1. Continue Ziprasidone 60 mg twice a day (QTc 450 msec on 03/16/2017) 2. Continue lorazepam 1 mg three times a day as needed for anxiety 3. Continue benztropine 0.5 mg twice a day  4. Return to clinic in one month for 30 mins

## 2017-05-24 ENCOUNTER — Ambulatory Visit (HOSPITAL_COMMUNITY): Payer: Self-pay | Admitting: Psychiatry

## 2017-06-11 ENCOUNTER — Other Ambulatory Visit (HOSPITAL_COMMUNITY): Payer: Self-pay | Admitting: Psychiatry

## 2017-06-11 MED ORDER — LORAZEPAM 1 MG PO TABS: 1.0000 mg | ORAL_TABLET | Freq: Three times a day (TID) | ORAL | 0 refills | Status: DC | PRN

## 2017-06-11 NOTE — Telephone Encounter (Signed)
Received a request for ativan. Last filled on 10/31. Ordered for another month of ativan.  I have utilized the [place] Controlled Substances Reporting System (PMP AWARxE) to confirm adherence regarding the patient's medication. My review reveals appropriate prescription fills.
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
## 2017-06-17 ENCOUNTER — Ambulatory Visit (INDEPENDENT_AMBULATORY_CARE_PROVIDER_SITE_OTHER): Payer: Medicare Other | Admitting: Psychiatry

## 2017-06-17 VITALS — BP 130/76 | HR 114 | Ht 67.0 in | Wt 128.0 lb

## 2017-06-17 DIAGNOSIS — F419 Anxiety disorder, unspecified: Secondary | ICD-10-CM

## 2017-06-17 DIAGNOSIS — Z810 Family history of intellectual disabilities: Secondary | ICD-10-CM

## 2017-06-17 DIAGNOSIS — F3162 Bipolar disorder, current episode mixed, moderate: Secondary | ICD-10-CM

## 2017-06-17 DIAGNOSIS — Z818 Family history of other mental and behavioral disorders: Secondary | ICD-10-CM | POA: Diagnosis not present

## 2017-06-17 DIAGNOSIS — F4310 Post-traumatic stress disorder, unspecified: Secondary | ICD-10-CM

## 2017-06-17 DIAGNOSIS — Z87891 Personal history of nicotine dependence: Secondary | ICD-10-CM | POA: Diagnosis not present

## 2017-06-17 DIAGNOSIS — R450 Nervousness: Secondary | ICD-10-CM | POA: Diagnosis not present

## 2017-06-17 DIAGNOSIS — G4700 Insomnia, unspecified: Secondary | ICD-10-CM | POA: Diagnosis not present

## 2017-06-17 MED ORDER — BENZTROPINE MESYLATE 0.5 MG PO TABS: 1.0000 mg | ORAL_TABLET | Freq: Two times a day (BID) | ORAL | 0 refills | Status: DC

## 2017-06-17 MED ORDER — LORAZEPAM 1 MG PO TABS: 1.0000 mg | ORAL_TABLET | Freq: Three times a day (TID) | ORAL | 0 refills | Status: DC | PRN

## 2017-06-17 MED ORDER — TRAZODONE HCL 100 MG PO TABS: 100.0000 mg | ORAL_TABLET | Freq: Every evening | ORAL | 0 refills | Status: DC | PRN

## 2017-06-17 MED ORDER — ZIPRASIDONE HCL 60 MG PO CAPS: 60.0000 mg | ORAL_CAPSULE | Freq: Two times a day (BID) | ORAL | 0 refills | Status: DC

## 2017-06-17 NOTE — Patient Instructions (Signed)
1. Continue Ziprasidone 60 mg twice a day  2. Continue lorazepam 1 mg three times a day as needed for anxiety 3. Continue benztropine 0.5 mg twice a day  4. Increase Trazodone 100 mg at night as needed for sleep 5. Return to clinic in one month for 30 mins

## 2017-06-25 ENCOUNTER — Telehealth (HOSPITAL_COMMUNITY): Payer: Self-pay | Admitting: *Deleted

## 2017-06-25 NOTE — Telephone Encounter (Signed)
Dr Vanetta ShawlHIsada, [facility] Case Manager Edson SnowballAngelina called to update on the patient with the following:  Since new medication changes there is significant weight lost, increased depression  & anxiety If you need to speak with Angelina # [phone] ext. [phone]

## 2017-07-05 ENCOUNTER — Ambulatory Visit (INDEPENDENT_AMBULATORY_CARE_PROVIDER_SITE_OTHER): Payer: Medicare Other | Admitting: Psychiatry

## 2017-07-05 ENCOUNTER — Encounter (HOSPITAL_COMMUNITY): Payer: Self-pay | Admitting: Psychiatry

## 2017-07-05 ENCOUNTER — Telehealth (HOSPITAL_COMMUNITY): Payer: Self-pay | Admitting: *Deleted

## 2017-07-05 DIAGNOSIS — F4310 Post-traumatic stress disorder, unspecified: Secondary | ICD-10-CM

## 2017-07-05 DIAGNOSIS — F3162 Bipolar disorder, current episode mixed, moderate: Secondary | ICD-10-CM | POA: Diagnosis not present
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
## 2017-07-05 NOTE — Telephone Encounter (Signed)
patient said she is having problems at home, causing Anxiety.   she needs something else.

## 2017-07-05 NOTE — Telephone Encounter (Signed)
Discussed with the patient. Please schedule her on 1/3 at 9 AM for 30 mins

## 2017-07-08 ENCOUNTER — Ambulatory Visit (INDEPENDENT_AMBULATORY_CARE_PROVIDER_SITE_OTHER): Payer: Medicare Other | Admitting: Psychiatry

## 2017-07-08 ENCOUNTER — Encounter (HOSPITAL_COMMUNITY): Payer: Self-pay | Admitting: Psychiatry

## 2017-07-08 VITALS — BP 143/80 | HR 76 | Ht 67.0 in | Wt 127.0 lb

## 2017-07-08 DIAGNOSIS — R450 Nervousness: Secondary | ICD-10-CM | POA: Diagnosis not present

## 2017-07-08 DIAGNOSIS — F419 Anxiety disorder, unspecified: Secondary | ICD-10-CM

## 2017-07-08 DIAGNOSIS — Z810 Family history of intellectual disabilities: Secondary | ICD-10-CM

## 2017-07-08 DIAGNOSIS — G4700 Insomnia, unspecified: Secondary | ICD-10-CM

## 2017-07-08 DIAGNOSIS — Z818 Family history of other mental and behavioral disorders: Secondary | ICD-10-CM

## 2017-07-08 DIAGNOSIS — F4310 Post-traumatic stress disorder, unspecified: Secondary | ICD-10-CM

## 2017-07-08 DIAGNOSIS — Z87891 Personal history of nicotine dependence: Secondary | ICD-10-CM

## 2017-07-08 DIAGNOSIS — F3162 Bipolar disorder, current episode mixed, moderate: Secondary | ICD-10-CM | POA: Diagnosis not present

## 2017-07-08 DIAGNOSIS — R443 Hallucinations, unspecified: Secondary | ICD-10-CM

## 2017-07-08 MED ORDER — ZIPRASIDONE HCL 60 MG PO CAPS: 60.0000 mg | ORAL_CAPSULE | Freq: Every evening | ORAL | 0 refills | Status: DC

## 2017-07-08 MED ORDER — BENZTROPINE MESYLATE 0.5 MG PO TABS: 1.0000 mg | ORAL_TABLET | Freq: Two times a day (BID) | ORAL | 0 refills | Status: DC

## 2017-07-08 MED ORDER — TRAZODONE HCL 100 MG PO TABS: 100.0000 mg | ORAL_TABLET | Freq: Every evening | ORAL | 0 refills | Status: DC | PRN

## 2017-07-08 MED ORDER — ZIPRASIDONE HCL 80 MG PO CAPS: 80.0000 mg | ORAL_CAPSULE | Freq: Every day | ORAL | 0 refills | Status: DC
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
## 2017-07-08 NOTE — Patient Instructions (Signed)
1. Increase ziprasidone 80 mg daily and 60 mg at night   2. Continue lorazepam 1 mg three times a day as needed for anxiety 3. Continue benztropine 0.5 mg twice a day  4. Continue Trazodone 100 mg at night as needed for sleep 5.Return to clinic in a few weeks for 30 mins
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
## 2017-07-15 ENCOUNTER — Encounter (INDEPENDENT_AMBULATORY_CARE_PROVIDER_SITE_OTHER): Payer: Self-pay | Admitting: Internal Medicine

## 2017-07-19 ENCOUNTER — Ambulatory Visit (INDEPENDENT_AMBULATORY_CARE_PROVIDER_SITE_OTHER): Payer: Medicare Other | Admitting: Psychiatry

## 2017-07-19 VITALS — BP 136/81 | HR 88 | Ht 67.0 in | Wt 129.0 lb

## 2017-07-19 DIAGNOSIS — F419 Anxiety disorder, unspecified: Secondary | ICD-10-CM

## 2017-07-19 DIAGNOSIS — G4700 Insomnia, unspecified: Secondary | ICD-10-CM | POA: Diagnosis not present

## 2017-07-19 DIAGNOSIS — Z818 Family history of other mental and behavioral disorders: Secondary | ICD-10-CM | POA: Diagnosis not present

## 2017-07-19 DIAGNOSIS — R440 Auditory hallucinations: Secondary | ICD-10-CM | POA: Diagnosis not present

## 2017-07-19 DIAGNOSIS — F4310 Post-traumatic stress disorder, unspecified: Secondary | ICD-10-CM | POA: Diagnosis not present

## 2017-07-19 DIAGNOSIS — Z810 Family history of intellectual disabilities: Secondary | ICD-10-CM | POA: Diagnosis not present

## 2017-07-19 DIAGNOSIS — F3162 Bipolar disorder, current episode mixed, moderate: Secondary | ICD-10-CM | POA: Diagnosis not present

## 2017-07-19 DIAGNOSIS — R450 Nervousness: Secondary | ICD-10-CM | POA: Diagnosis not present

## 2017-07-19 MED ORDER — LORAZEPAM 1 MG PO TABS: 1.0000 mg | ORAL_TABLET | Freq: Three times a day (TID) | ORAL | 1 refills | Status: DC | PRN

## 2017-07-19 MED ORDER — TRAZODONE HCL 100 MG PO TABS: 100.0000 mg | ORAL_TABLET | Freq: Every evening | ORAL | 0 refills | Status: DC | PRN

## 2017-07-19 MED ORDER — ZIPRASIDONE HCL 80 MG PO CAPS: 80.0000 mg | ORAL_CAPSULE | Freq: Every day | ORAL | 0 refills | Status: DC

## 2017-07-19 MED ORDER — BENZTROPINE MESYLATE 0.5 MG PO TABS: 0.5000 mg | ORAL_TABLET | Freq: Two times a day (BID) | ORAL | 0 refills | Status: DC

## 2017-07-19 MED ORDER — ZIPRASIDONE HCL 60 MG PO CAPS: 60.0000 mg | ORAL_CAPSULE | Freq: Every evening | ORAL | 0 refills | Status: DC

## 2017-07-19 NOTE — Patient Instructions (Signed)
1. Continue  ziprasidone 80 mg daily and 60 mg at night  (QTc 450 msec on 03/16/2017) 2. Continue lorazepam 1 mg three times a day as needed for anxiety (Refill after 2/1) 3. Continue benztropine 0.5 mg twice a day  4. Continue Trazodone 100 mg at night as needed for sleep 5.Return to clinic in two months for 30 mins

## 2017-07-20 ENCOUNTER — Ambulatory Visit (INDEPENDENT_AMBULATORY_CARE_PROVIDER_SITE_OTHER): Payer: Medicare Other | Admitting: Psychiatry

## 2017-07-20 ENCOUNTER — Encounter (HOSPITAL_COMMUNITY): Payer: Self-pay

## 2017-07-20 DIAGNOSIS — F4310 Post-traumatic stress disorder, unspecified: Secondary | ICD-10-CM

## 2017-07-20 DIAGNOSIS — F3162 Bipolar disorder, current episode mixed, moderate: Secondary | ICD-10-CM

## 2017-07-20 NOTE — Progress Notes (Signed)
Patient and therapist agreed to cancel session as patient received emergency phone call in session indicating her brother-in-law is dying.

## 2017-08-19 ENCOUNTER — Encounter (HOSPITAL_COMMUNITY): Payer: Self-pay | Admitting: Psychiatry

## 2017-08-19 ENCOUNTER — Ambulatory Visit (INDEPENDENT_AMBULATORY_CARE_PROVIDER_SITE_OTHER): Payer: Medicare Other | Admitting: Psychiatry

## 2017-08-19 VITALS — BP 134/73 | HR 79 | Ht 67.0 in | Wt 130.0 lb

## 2017-08-19 DIAGNOSIS — Z6379 Other stressful life events affecting family and household: Secondary | ICD-10-CM | POA: Diagnosis not present

## 2017-08-19 DIAGNOSIS — F419 Anxiety disorder, unspecified: Secondary | ICD-10-CM | POA: Diagnosis not present

## 2017-08-19 DIAGNOSIS — G4700 Insomnia, unspecified: Secondary | ICD-10-CM

## 2017-08-19 DIAGNOSIS — Z87891 Personal history of nicotine dependence: Secondary | ICD-10-CM

## 2017-08-19 DIAGNOSIS — Z810 Family history of intellectual disabilities: Secondary | ICD-10-CM

## 2017-08-19 DIAGNOSIS — R450 Nervousness: Secondary | ICD-10-CM | POA: Diagnosis not present

## 2017-08-19 DIAGNOSIS — Z818 Family history of other mental and behavioral disorders: Secondary | ICD-10-CM | POA: Diagnosis not present

## 2017-08-19 DIAGNOSIS — F4310 Post-traumatic stress disorder, unspecified: Secondary | ICD-10-CM | POA: Diagnosis not present

## 2017-08-19 DIAGNOSIS — F3162 Bipolar disorder, current episode mixed, moderate: Secondary | ICD-10-CM
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
## 2017-08-19 NOTE — Patient Instructions (Signed)
1. Continueziprasidone 80 mg daily and 60 mg at night  2.Continue lorazepam 1 mg three times a day as needed for anxiety(Refill after 2/1) 3. Continue benztropine 0.5 mg twice a day 4. Continue Trazodone 100 mg at night as needed for sleep 5.keep the appointment in March

## 2017-08-20 IMAGING — CT CT ABD-PELV W/ CM
2 of 5 series · 14 of 46 positions shown, 16 images · IV contrast (iopamidol)
Comparison: Abdominal pelvic CT 08/03/2015

CLINICAL DATA: Left lower quadrant abdominal pain. History of
irritable bowel syndrome, COPD and hysterectomy.

EXAM:
CT ABDOMEN AND PELVIS WITH CONTRAST
TECHNIQUE: Multidetector CT imaging of the abdomen and pelvis was performed
using the standard protocol following bolus administration of
intravenous contrast.
CONTRAST:  100mL [id number] IOPAMIDOL (VJXOUO-8WW) INJECTION 61%

[Series 2: routine abd pel with · axial · 0.68mm/px · z∈[+572,+968]mm · 11 of 89 slices shown, 13 images]
[im 5/89  soft-tissue]
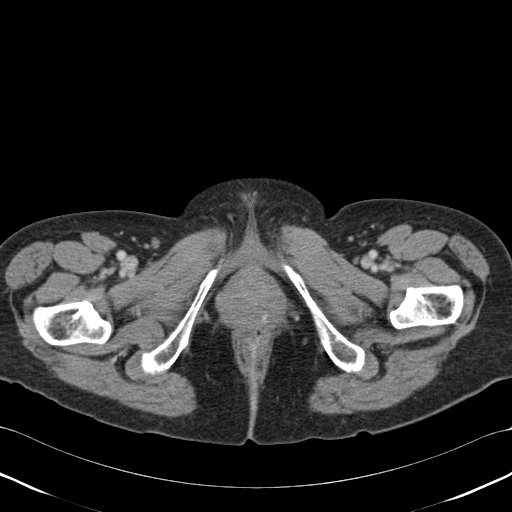
[im 5/89  bone]
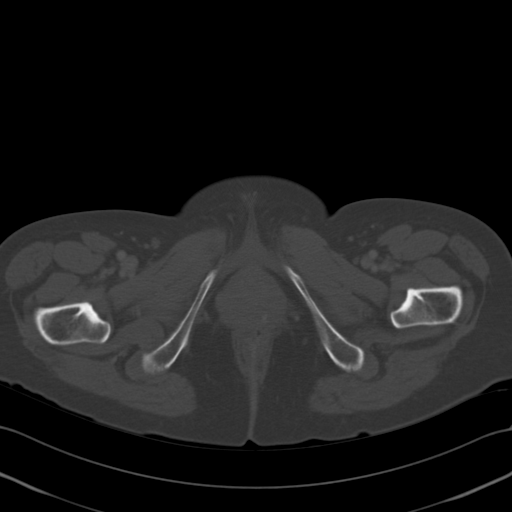
[im 14/89  soft-tissue]
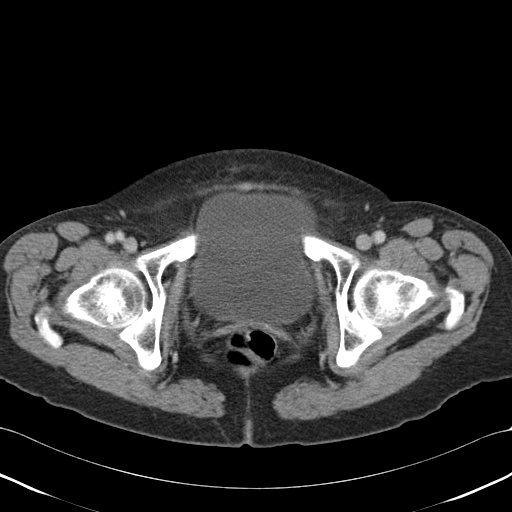
[im 24/89  soft-tissue]
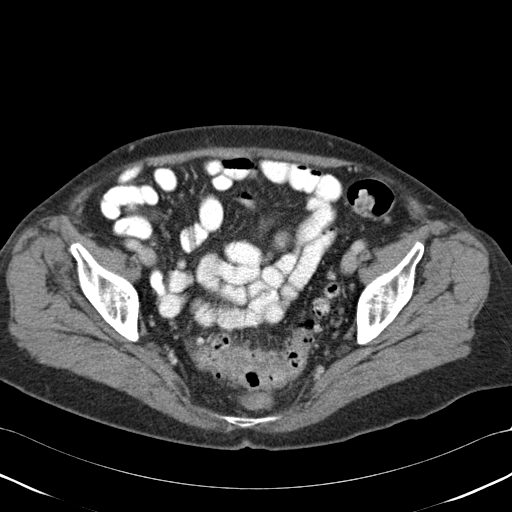
[im 28/89  soft-tissue]
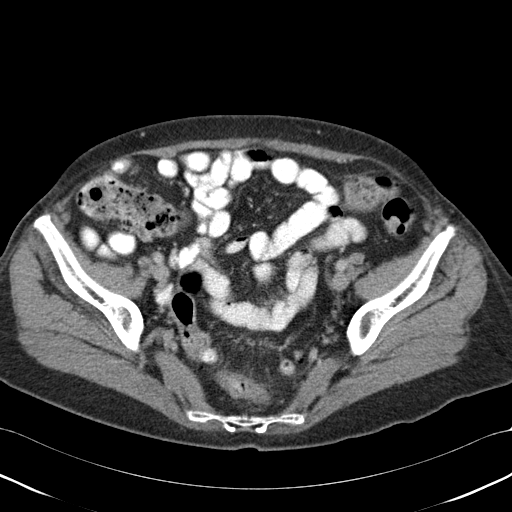
[im 38/89  soft-tissue]
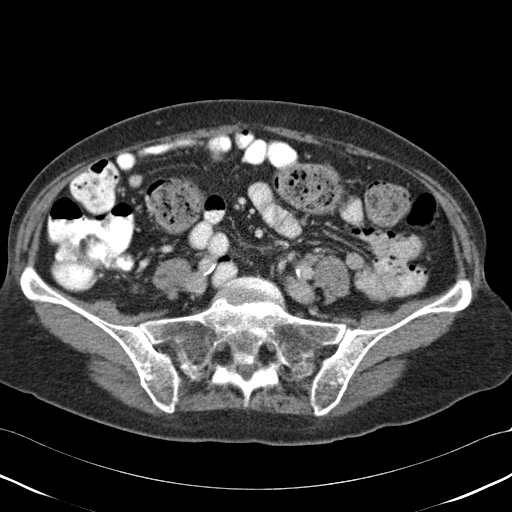
[im 47/89  soft-tissue]
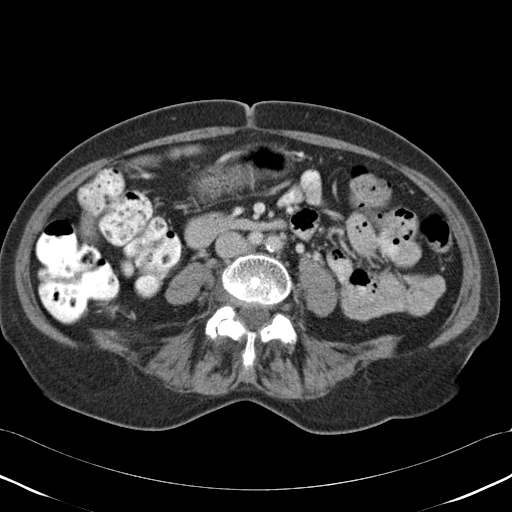
[im 51/89  soft-tissue]
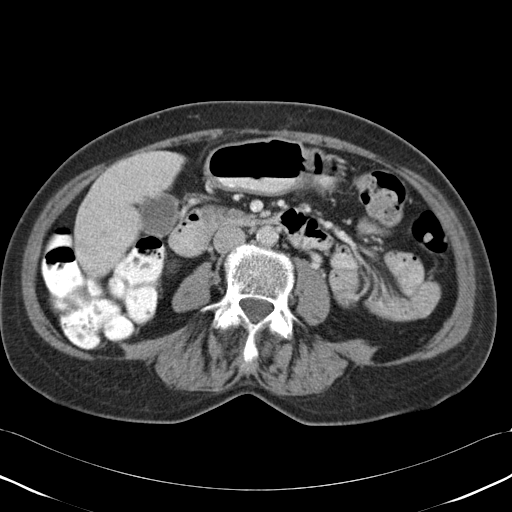
[im 61/89  soft-tissue]
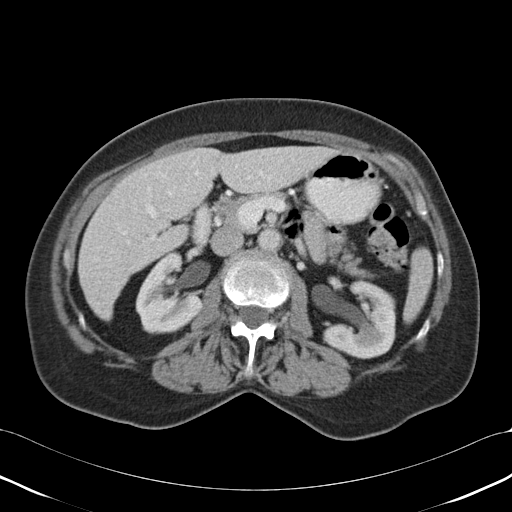
[im 65/89  soft-tissue]
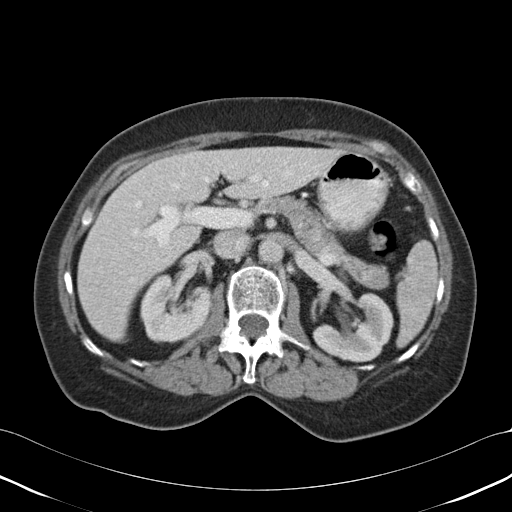
[im 65/89  bone]
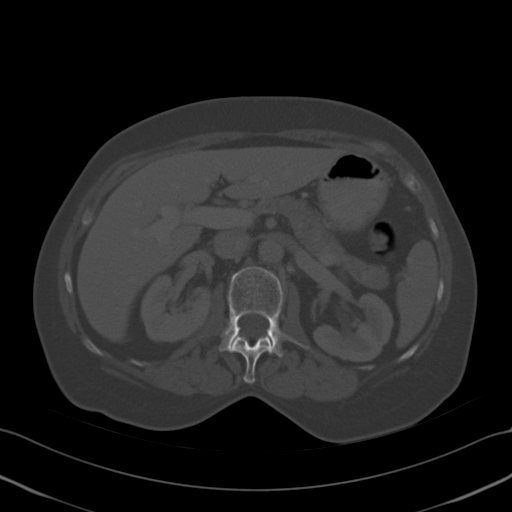
[im 75/89  soft-tissue]
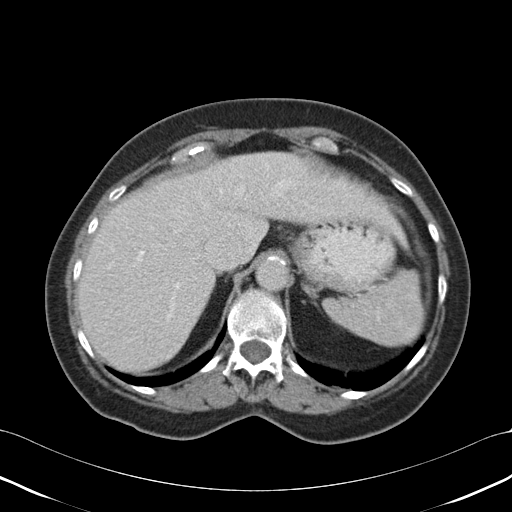
[im 84/89  soft-tissue]
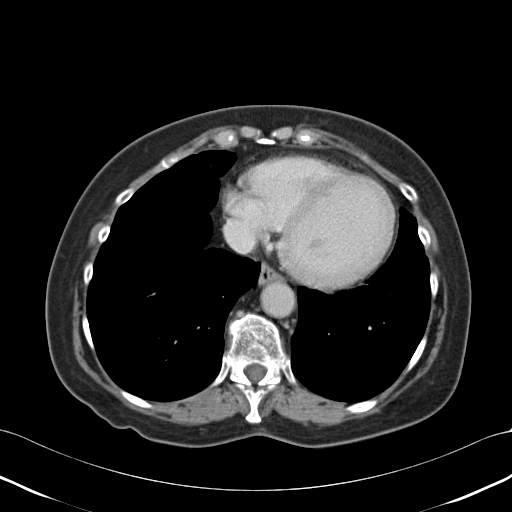

[Series 4: coronal · coronal · 0.66mm/px · 3 of 112 slices shown]
[im 38/112  soft-tissue]
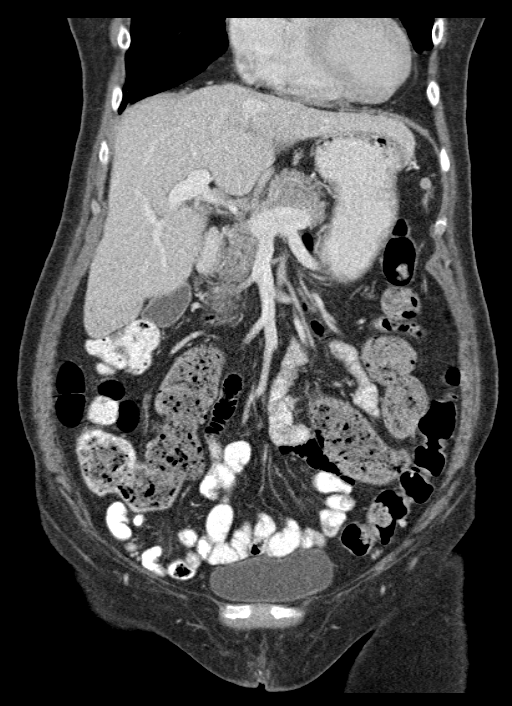
[im 50/112  soft-tissue]
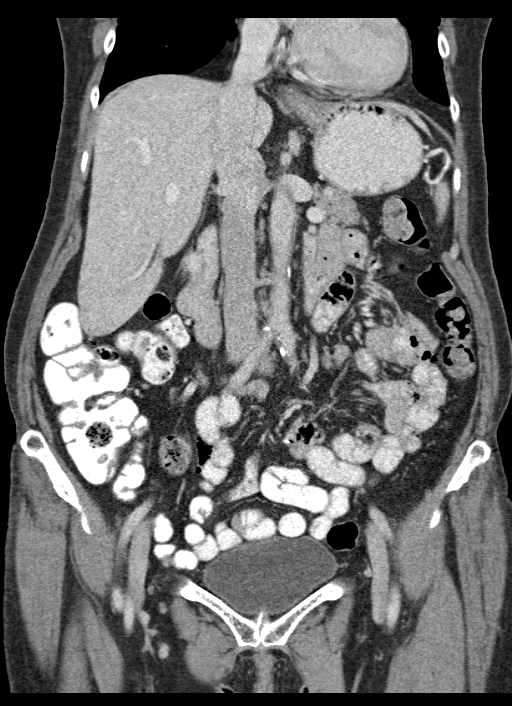
[im 62/112  soft-tissue]
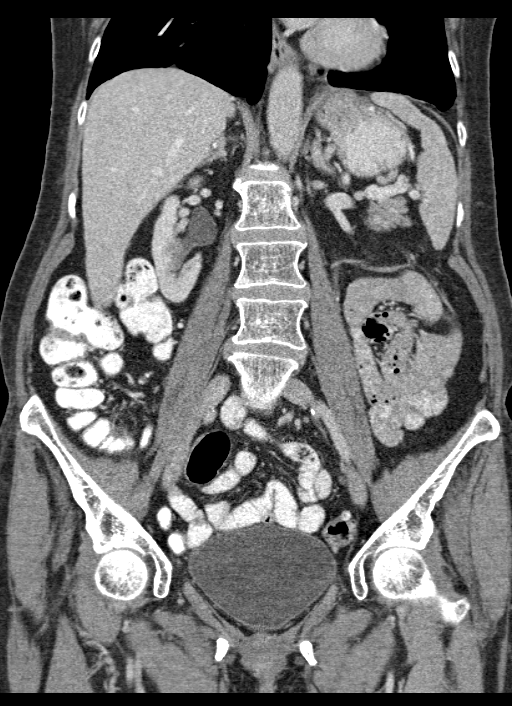

[14 of 46 positions shown; findings below may reference images not displayed]

FINDINGS: Lower chest: Ground-glass opacities and tree-in-bud nodularity are
noted centrally in the right lower lobe, likely inflammatory. The
left lung base appears clear. There is no significant pleural or
pericardial effusion.

Hepatobiliary: 1.6 cm somewhat linear area of low density in the
lateral segment of the left hepatic lobe on image 21 is unchanged.
There is an ill-defined 6 mm lesion in the dome of the right hepatic
lobe on image number 12 which is not evident on the prior study. In
addition, there is an 8 mm low-density lesion inferiorly in the
medial segment of the left lobe (segment 4B) which appears new. No
morphologic changes of cirrhosis. No evidence of gallstones,
gallbladder wall thickening or biliary dilatation.

Pancreas: Unremarkable. No pancreatic ductal dilatation or
surrounding inflammatory changes.

Spleen: Normal in size without focal abnormality.

Adrenals/Urinary Tract: Both adrenal glands appear normal. Stable 12
mm cyst in the upper pole of the left kidney. No evidence of renal
mass, hydronephrosis or urinary tract calculus. The bladder appears
unremarkable.

Stomach/Bowel: The stomach, small bowel, appendix and proximal colon
demonstrate no acute findings. There is moderate stool throughout
the colon. There are diverticular changes throughout the sigmoid
colon with mild wall thickening and mild surrounding inflammatory
change. These findings are suspicious for mild recurrent
diverticulitis, although the appearance is improved compared with
the prior study.

Vascular/Lymphatic: There are no enlarged abdominal or pelvic lymph
nodes. Aortic and branch vessel atherosclerosis. No acute vascular
findings seen.

Reproductive: Hysterectomy.  No evidence of adnexal mass.

Other: The anterior abdominal wall appears normal. No ascites or
free air.

Musculoskeletal: No acute or significant osseous findings.
IMPRESSION: 1. Possible mild recurrent sigmoid diverticulitis. The findings are
not as severe as those demonstrated on the prior study of 7 months
ago. No evidence of bowel perforation or obstruction.
2. Patchy ground-glass opacity and tree-in-bud nodularity at the
right lung base, suspicious for inflammation. Chest radiographic
follow up recommended.
3. **An incidental finding of potential clinical significance has
been found. Small ill-defined hepatic lesions, 2 of which may be new
compared with prior study (versus better visualized). These are
indeterminate in etiology. Management options include abdominal MRI
for further characterization and CT follow-up in 6 months to assess
stability. **
4.  Aortic Atherosclerosis ([id number]-170.0)

## 2017-08-26 ENCOUNTER — Ambulatory Visit (INDEPENDENT_AMBULATORY_CARE_PROVIDER_SITE_OTHER): Payer: Self-pay | Admitting: Internal Medicine

## 2017-08-27 ENCOUNTER — Ambulatory Visit (INDEPENDENT_AMBULATORY_CARE_PROVIDER_SITE_OTHER): Payer: Self-pay | Admitting: Internal Medicine

## 2017-08-27 ENCOUNTER — Other Ambulatory Visit (INDEPENDENT_AMBULATORY_CARE_PROVIDER_SITE_OTHER): Payer: Self-pay | Admitting: Internal Medicine

## 2017-08-31 ENCOUNTER — Ambulatory Visit (INDEPENDENT_AMBULATORY_CARE_PROVIDER_SITE_OTHER): Payer: Medicare Other | Admitting: Internal Medicine

## 2017-08-31 ENCOUNTER — Encounter (INDEPENDENT_AMBULATORY_CARE_PROVIDER_SITE_OTHER): Payer: Self-pay | Admitting: Internal Medicine

## 2017-08-31 VITALS — BP 120/82 | HR 72 | Temp 97.9°F | Ht 68.0 in | Wt 130.9 lb

## 2017-08-31 DIAGNOSIS — K580 Irritable bowel syndrome with diarrhea: Secondary | ICD-10-CM | POA: Diagnosis not present

## 2017-08-31 NOTE — Progress Notes (Signed)
Subjective:    Patient ID: Kristen Ryan, female    DOB: [birth year], 62 y.o.   MRN: [id number] Weight 08/2016 129.3 HPI Here today for f/u. Last seen in February of 2018. Hx of IBS/diarrhea. She is doing okay as far as GI. She has gained 2 pounds since her last visit. Her appetite is okay. No abdominal pain. She has a BM x 1 a day. She takes Dicyclomine TID. She says she has hx of bipolar and depression.  She tries to exercise by walking but not consistently.  She tells me she has had some trouble with constipation.    She underwent a colonoscopy in February 2016 which revealed Impression:  Examination performed to cecum. Redundant colon with a moderate number of diverticula at sigmoid colon. Small external hemorrhoids.   She is disabled. Married. One child.  Review of Systems Past Medical History:  Diagnosis Date  . Anemia   . Asthma   . Bipolar 1 disorder (HCC)   . Bronchitis   . COPD (chronic obstructive pulmonary disease) (HCC)   . Diverticulitis   . IBS (irritable bowel syndrome)   . Neuropathy   . Schizophrenia (HCC)   . Thyroid disease     Past Surgical History:  Procedure Laterality Date  . ABDOMINAL HYSTERECTOMY    . broke left arm    . CESAREAN SECTION    . COLONOSCOPY N/A 08/30/2014   Procedure: COLONOSCOPY;  Surgeon: Malissa HippoNajeeb U Rehman, MD;  Location: [facility];  Service: Endoscopy;  Laterality: N/A;  1200  . ELBOW SURGERY    . FOOT SURGERY    . KNEE SURGERY    . TONSILLECTOMY      Allergies  Allergen Reactions  . Aminophylline Anaphylaxis  . Sumatriptan Anaphylaxis and Other (See Comments)    Causes fainting  . Theophylline Anaphylaxis  . Codeine Itching and Other (See Comments)    Too strong for patient   . Depakote [Divalproex Sodium] Other (See Comments)    Hair loss  . Divalproex Sodium Other (See Comments)    Causes hair to fall out  . Doxycycline Other (See Comments)    headache  . Lamictal [Lamotrigine]     Destroys thyroid  .  Magnesium-Containing Compounds Hives  . Metronidazole   . Nabumetone Swelling  . Naproxen Swelling  . Other     Hair dye  . Penicillins Nausea And Vomiting    Has patient had a PCN reaction causing immediate rash, facial/tongue/throat swelling, SOB or lightheadedness with hypotension: Yes Has patient had a PCN reaction causing severe rash involving mucus membranes or skin necrosis: No Has patient had a PCN reaction that required hospitalization Yes Has patient had a PCN reaction occurring within the last 10 years: No If all of the above answers are "NO", then may proceed with Cephalosporin use.   Marland Kitchen. Pentazocine Lactate Other (See Comments)    Unknown reaction  . Risperidone Other (See Comments)    Insomnia   . Seroquel [Quetiapine] Swelling  . Tegretol [Carbamazepine] Swelling  . Clonidine Derivatives     Dizziness at 0.1 mg  . Tetracycline Rash    Headaches  . Zyprexa [Olanzapine] Rash and Other (See Comments)    Insomnia    Current Outpatient Medications on File Prior to Visit  Medication Sig Dispense Refill  . albuterol (PROVENTIL) (2.5 MG/3ML) 0.083% nebulizer solution Take 3 mLs (2.5 mg total) by nebulization every 6 (six) hours as needed for wheezing. 75 mL 12  . Albuterol  Sulfate (PROVENTIL HFA IN) Inhale 1-2 puffs into the lungs daily.     Marland Kitchen aspirin 325 MG tablet Take 325 mg by mouth daily.    . benztropine (COGENTIN) 0.5 MG tablet Take 1 tablet (0.5 mg total) by mouth 2 (two) times daily. 180 tablet 0  . budesonide-formoterol (SYMBICORT) 160-4.5 MCG/ACT inhaler Inhale 2 puffs into the lungs 2 (two) times daily. 1 Inhaler 1  . cetirizine (ZYRTEC) 10 MG tablet Take 10 mg by mouth daily.    . Cholecalciferol (VITAMIN D-3) 5000 units TABS Take 1,000 Units by mouth daily.     Marland Kitchen dicyclomine (BENTYL) 10 MG capsule TAKE 1 CAPSULE BY MOUTH THREE TIMES DAILY AS NEEDED FOR SPASMS. 90 capsule 5  . levothyroxine (SYNTHROID, LEVOTHROID) 50 MCG tablet Take 50 mcg by mouth daily.    Marland Kitchen  LORazepam (ATIVAN) 1 MG tablet Take 1 tablet (1 mg total) by mouth 3 (three) times daily as needed for anxiety. 90 tablet 1  . Multiple Vitamin (MULTIVITAMIN WITH MINERALS) TABS tablet Take 1 tablet by mouth daily.    . naproxen (NAPROSYN) 500 MG tablet Take 500 mg by mouth 2 (two) times daily with a meal.    . Oxycodone HCl 10 MG TABS Take by mouth 4 (four) times daily.    . Selenium 200 MCG TABS Take by mouth daily.    . simvastatin (ZOCOR) 40 MG tablet Take 1 tablet by mouth daily.    . sodium chloride (MURO 128) 5 % ophthalmic solution Place 3 drops into both eyes as needed for irritation.    . traZODone (DESYREL) 100 MG tablet Take 1 tablet (100 mg total) by mouth at bedtime as needed for sleep. 90 tablet 0  . ziprasidone (GEODON) 80 MG capsule Take 1 capsule (80 mg total) by mouth daily with breakfast. 90 capsule 0  . [DISCONTINUED] carbamazepine (TEGRETOL) 200 MG tablet Take 400-600 tablets by mouth 2 (two) times daily. 2 tablets in the morning and 3 tablets at bedtime.     No current facility-administered medications on file prior to visit.         Objective:   Physical Exam Blood pressure 120/82, pulse 72, temperature 97.9 F (36.6 C), height 5\' 8"  (1.727 m), weight 130 lb 14.4 oz (59.4 kg). Alert and oriented. Skin warm and dry. Oral mucosa is moist.   . Sclera anicteric, conjunctivae is pink. Thyroid not enlarged. No cervical lymphadenopathy. Lungs clear. Heart regular rate and rhythm.  Abdomen is soft. Bowel sounds are positive. No hepatomegaly. No abdominal masses felt. No tenderness.  No edema to lower extremities.           Assessment & Plan:  IBS/diarrhea. She is doing well. She having one BM a day. Has had some constipation recently.  Start taking the Dicyclomine BID and see how you do.

## 2017-08-31 NOTE — Patient Instructions (Signed)
Reduce Dicyclomine to BID. OV in 1 year.
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
## 2017-09-16 ENCOUNTER — Encounter (HOSPITAL_COMMUNITY): Payer: Self-pay | Admitting: Psychiatry

## 2017-09-16 ENCOUNTER — Ambulatory Visit (INDEPENDENT_AMBULATORY_CARE_PROVIDER_SITE_OTHER): Payer: Medicare Other | Admitting: Psychiatry

## 2017-09-16 VITALS — BP 97/70 | HR 90 | Ht 68.0 in | Wt 127.0 lb

## 2017-09-16 DIAGNOSIS — Z810 Family history of intellectual disabilities: Secondary | ICD-10-CM | POA: Diagnosis not present

## 2017-09-16 DIAGNOSIS — F419 Anxiety disorder, unspecified: Secondary | ICD-10-CM | POA: Diagnosis not present

## 2017-09-16 DIAGNOSIS — G4700 Insomnia, unspecified: Secondary | ICD-10-CM

## 2017-09-16 DIAGNOSIS — Z87891 Personal history of nicotine dependence: Secondary | ICD-10-CM

## 2017-09-16 DIAGNOSIS — R450 Nervousness: Secondary | ICD-10-CM | POA: Diagnosis not present

## 2017-09-16 DIAGNOSIS — F4310 Post-traumatic stress disorder, unspecified: Secondary | ICD-10-CM

## 2017-09-16 DIAGNOSIS — F3162 Bipolar disorder, current episode mixed, moderate: Secondary | ICD-10-CM

## 2017-09-16 DIAGNOSIS — Z818 Family history of other mental and behavioral disorders: Secondary | ICD-10-CM | POA: Diagnosis not present

## 2017-09-16 MED ORDER — TRAZODONE HCL 100 MG PO TABS: 100.0000 mg | ORAL_TABLET | Freq: Every evening | ORAL | 0 refills | Status: DC | PRN

## 2017-09-16 MED ORDER — ZIPRASIDONE HCL 80 MG PO CAPS: 80.0000 mg | ORAL_CAPSULE | Freq: Every day | ORAL | 0 refills | Status: DC

## 2017-09-16 MED ORDER — BENZTROPINE MESYLATE 0.5 MG PO TABS: 0.5000 mg | ORAL_TABLET | Freq: Two times a day (BID) | ORAL | 0 refills | Status: DC

## 2017-09-16 MED ORDER — LORAZEPAM 1 MG PO TABS: 1.0000 mg | ORAL_TABLET | Freq: Three times a day (TID) | ORAL | 1 refills | Status: DC | PRN

## 2017-09-16 MED ORDER — ZIPRASIDONE HCL 60 MG PO CAPS: 60.0000 mg | ORAL_CAPSULE | Freq: Every day | ORAL | 0 refills | Status: DC

## 2017-09-16 NOTE — Patient Instructions (Signed)
1. Continueziprasidone 80 mg daily and 60 mg at night(QTc 450 msec on 03/16/2017) 2.Continue lorazepam 1 mg three times a day as needed for anxiety(Refill after 2/1) 3. Continue benztropine 0.5 mg twice a day 4. Continue Trazodone 100 mg at night as needed for sleep 5.Return to [facility] for 30 mins

## 2017-09-21 ENCOUNTER — Ambulatory Visit (HOSPITAL_COMMUNITY): Payer: Self-pay | Admitting: Psychiatry

## 2017-10-28 ENCOUNTER — Other Ambulatory Visit (INDEPENDENT_AMBULATORY_CARE_PROVIDER_SITE_OTHER): Payer: Self-pay | Admitting: Internal Medicine
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
## 2017-11-01 ENCOUNTER — Encounter (HOSPITAL_COMMUNITY): Payer: Self-pay | Admitting: Psychiatry

## 2017-11-01 ENCOUNTER — Ambulatory Visit (INDEPENDENT_AMBULATORY_CARE_PROVIDER_SITE_OTHER): Payer: Medicare Other | Admitting: Psychiatry

## 2017-11-01 VITALS — BP 143/84 | HR 81 | Ht 68.0 in | Wt 121.0 lb

## 2017-11-01 DIAGNOSIS — G4700 Insomnia, unspecified: Secondary | ICD-10-CM | POA: Diagnosis not present

## 2017-11-01 DIAGNOSIS — F3162 Bipolar disorder, current episode mixed, moderate: Secondary | ICD-10-CM | POA: Diagnosis not present

## 2017-11-01 DIAGNOSIS — Z91410 Personal history of adult physical and sexual abuse: Secondary | ICD-10-CM | POA: Diagnosis not present

## 2017-11-01 DIAGNOSIS — Z87891 Personal history of nicotine dependence: Secondary | ICD-10-CM | POA: Diagnosis not present

## 2017-11-01 DIAGNOSIS — F419 Anxiety disorder, unspecified: Secondary | ICD-10-CM

## 2017-11-01 DIAGNOSIS — G8929 Other chronic pain: Secondary | ICD-10-CM

## 2017-11-01 DIAGNOSIS — R450 Nervousness: Secondary | ICD-10-CM | POA: Diagnosis not present

## 2017-11-01 DIAGNOSIS — F4310 Post-traumatic stress disorder, unspecified: Secondary | ICD-10-CM

## 2017-11-01 DIAGNOSIS — Z810 Family history of intellectual disabilities: Secondary | ICD-10-CM

## 2017-11-01 DIAGNOSIS — Z818 Family history of other mental and behavioral disorders: Secondary | ICD-10-CM

## 2017-11-01 NOTE — Patient Instructions (Addendum)
1. Continueziprasidone 80 mg daily and 60 mg at night 2.Continue lorazepam 1 mg three times a day as needed for anxiety 3. Continue benztropine 0.5 mg twice a day 4. Continue Trazodone 100 mg at night as needed for sleep 5.Return to clinicinone month for 30 mins

## 2017-11-09 ENCOUNTER — Ambulatory Visit (INDEPENDENT_AMBULATORY_CARE_PROVIDER_SITE_OTHER): Payer: Medicare Other | Admitting: Psychiatry

## 2017-11-09 DIAGNOSIS — F4310 Post-traumatic stress disorder, unspecified: Secondary | ICD-10-CM | POA: Diagnosis not present

## 2017-11-09 DIAGNOSIS — F3162 Bipolar disorder, current episode mixed, moderate: Secondary | ICD-10-CM | POA: Diagnosis not present
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
## 2017-12-01 ENCOUNTER — Ambulatory Visit (INDEPENDENT_AMBULATORY_CARE_PROVIDER_SITE_OTHER): Payer: Medicare Other | Admitting: Psychiatry

## 2017-12-01 ENCOUNTER — Encounter (HOSPITAL_COMMUNITY): Payer: Self-pay | Admitting: Psychiatry

## 2017-12-01 VITALS — BP 147/80 | HR 96 | Ht 68.0 in | Wt 116.0 lb

## 2017-12-01 DIAGNOSIS — F419 Anxiety disorder, unspecified: Secondary | ICD-10-CM

## 2017-12-01 DIAGNOSIS — Z87891 Personal history of nicotine dependence: Secondary | ICD-10-CM | POA: Diagnosis not present

## 2017-12-01 DIAGNOSIS — R450 Nervousness: Secondary | ICD-10-CM | POA: Diagnosis not present

## 2017-12-01 DIAGNOSIS — Z810 Family history of intellectual disabilities: Secondary | ICD-10-CM | POA: Diagnosis not present

## 2017-12-01 DIAGNOSIS — F3162 Bipolar disorder, current episode mixed, moderate: Secondary | ICD-10-CM

## 2017-12-01 DIAGNOSIS — Z818 Family history of other mental and behavioral disorders: Secondary | ICD-10-CM | POA: Diagnosis not present

## 2017-12-01 DIAGNOSIS — F4310 Post-traumatic stress disorder, unspecified: Secondary | ICD-10-CM | POA: Diagnosis not present

## 2017-12-01 MED ORDER — ZIPRASIDONE HCL 80 MG PO CAPS: 80.0000 mg | ORAL_CAPSULE | Freq: Every day | ORAL | 0 refills | Status: DC

## 2017-12-01 MED ORDER — ZIPRASIDONE HCL 60 MG PO CAPS: 60.0000 mg | ORAL_CAPSULE | Freq: Every day | ORAL | 0 refills | Status: DC

## 2017-12-01 MED ORDER — BENZTROPINE MESYLATE 0.5 MG PO TABS: 0.5000 mg | ORAL_TABLET | Freq: Two times a day (BID) | ORAL | 0 refills | Status: DC

## 2017-12-01 MED ORDER — TRAZODONE HCL 100 MG PO TABS: 100.0000 mg | ORAL_TABLET | Freq: Every evening | ORAL | 0 refills | Status: DC | PRN

## 2017-12-01 NOTE — Patient Instructions (Signed)
1. Continueziprasidone 80 mg daily and 60 mg at night 2. Hold ativan 3. Continue benztropine 0.5 mg twice a day 4. Continue Trazodone 100 mg at night as needed for sleep 5.Return to clinicinone month for 30 mins - TSH checked by her PCP, Dr. Janna Arch 6. Contact [facility] ([facility]): [phone] [address], [place], [place] [address]

## 2017-12-30 NOTE — Progress Notes (Deleted)
BH MD/PA/NP OP Progress Note  12/30/2017 2:37 PM Kristen Ryan  MRN:  [id number]  Chief Complaint:  HPI: *** Visit Diagnosis: No diagnosis found.  Past Psychiatric History:  Please see initial evaluation for full details. I have reviewed the history. No updates at this time.     Past Medical History:  Past Medical History:  Diagnosis Date  . Anemia   . Asthma   . Bipolar 1 disorder (HCC)   . Bronchitis   . COPD (chronic obstructive pulmonary disease) (HCC)   . Diverticulitis   . IBS (irritable bowel syndrome)   . Neuropathy   . Schizophrenia (HCC)   . Thyroid disease     Past Surgical History:  Procedure Laterality Date  . ABDOMINAL HYSTERECTOMY    . broke left arm    . CESAREAN SECTION    . COLONOSCOPY N/A 08/30/2014   Procedure: COLONOSCOPY;  Surgeon: Malissa HippoNajeeb U Rehman, MD;  [facility]: [facility];  Service: Endoscopy;  Laterality: N/A;  1200  . ELBOW SURGERY    . FOOT SURGERY    . KNEE SURGERY    . TONSILLECTOMY      Family Psychiatric History: Please see initial evaluation for full details. I have reviewed the history. No updates at this time.     Family History:  Family History  Problem Relation Age of Onset  . Dementia Mother   . Bipolar disorder Mother   . Dementia Father   . Other Son        MVA accident    Social History:  Social History   Socioeconomic History  . Marital status: Married    Spouse name: Not on file  . Number of children: Not on file  . Years of education: Not on file  . Highest education level: Not on file  Occupational History  . Not on file  Social Needs  . Financial resource strain: Not on file  . Food insecurity:    Worry: Not on file    Inability: Not on file  . Transportation needs:    Medical: Not on file    Non-medical: Not on file  Tobacco Use  . Smoking status: Former Smoker    Packs/day: 4.00    Types: Cigarettes    Last attempt to quit: 07/06/2008    Years since quitting: 9.4  . Smokeless tobacco:  Never Used  Substance and Sexual Activity  . Alcohol use: No    Comment: 08-27-2016 per pt not no more. per pt she stopped 1987  . Drug use: No    Comment: 08-27-2016 per pt no  . Sexual activity: Yes    Birth control/protection: Surgical    Comment: hyst  Lifestyle  . Physical activity:    Days per week: Not on file    Minutes per session: Not on file  . Stress: Not on file  Relationships  . Social connections:    Talks on phone: Not on file    Gets together: Not on file    Attends religious service: Not on file    Active member of club or organization: Not on file    Attends meetings of clubs or organizations: Not on file    Relationship status: Not on file  Other Topics Concern  . Not on file  Social History Narrative  . Not on file    Allergies:  Allergies  Allergen Reactions  . Aminophylline Anaphylaxis  . Sumatriptan Anaphylaxis and Other (See Comments)    Causes fainting  .  Theophylline Anaphylaxis  . Codeine Itching and Other (See Comments)    Too strong for patient   . Depakote [Divalproex Sodium] Other (See Comments)    Hair loss  . Divalproex Sodium Other (See Comments)    Causes hair to fall out  . Doxycycline Other (See Comments)    headache  . Lamictal [Lamotrigine]     Destroys thyroid  . Lithium Other (See Comments)    Adverse reaction to Thyroid   . Magnesium-Containing Compounds Hives  . Metronidazole   . Nabumetone Swelling  . Naproxen Swelling  . Other     Hair dye  . Penicillins Nausea And Vomiting    Has patient had a PCN reaction causing immediate rash, facial/tongue/throat swelling, SOB or lightheadedness with hypotension: Yes Has patient had a PCN reaction causing severe rash involving mucus membranes or skin necrosis: No Has patient had a PCN reaction that required hospitalization Yes Has patient had a PCN reaction occurring within the last 10 years: No If all of the above answers are "NO", then may proceed with Cephalosporin use.    Marland Kitchen Pentazocine Lactate Other (See Comments)    Unknown reaction  . Risperidone Other (See Comments)    Insomnia   . Seroquel [Quetiapine] Swelling  . Tegretol [Carbamazepine] Swelling  . Clonidine Derivatives     Dizziness at 0.1 mg  . Tetracycline Rash    Headaches  . Zyprexa [Olanzapine] Rash and Other (See Comments)    Insomnia    Metabolic Disorder Labs: No results found for: HGBA1C, MPG No results found for: PROLACTIN No results found for: CHOL, TRIG, HDL, CHOLHDL, VLDL, LDLCALC Lab Results  Component Value Date   TSH 0.888 07/07/2010    Therapeutic Level Labs: Lab Results  Component Value Date   LITHIUM 1.27 10/25/2013   No results found for: VALPROATE No components found for:  CBMZ  Current Medications: Current Outpatient Medications  Medication Sig Dispense Refill  . albuterol (PROVENTIL) (2.5 MG/3ML) 0.083% nebulizer solution Take 3 mLs (2.5 mg total) by nebulization every 6 (six) hours as needed for wheezing. 75 mL 12  . Albuterol Sulfate (PROVENTIL HFA IN) Inhale 1-2 puffs into the lungs daily.     Marland Kitchen aspirin 325 MG tablet Take 325 mg by mouth daily.    . benztropine (COGENTIN) 0.5 MG tablet Take 1 tablet (0.5 mg total) by mouth 2 (two) times daily. 180 tablet 0  . budesonide-formoterol (SYMBICORT) 160-4.5 MCG/ACT inhaler Inhale 2 puffs into the lungs 2 (two) times daily. 1 Inhaler 1  . cetirizine (ZYRTEC) 10 MG tablet Take 10 mg by mouth daily.    Marland Kitchen dicyclomine (BENTYL) 10 MG capsule TAKE 1 CAPSULE BY MOUTH THREE TIMES DAILY AS NEEDED FOR SPASMS. 90 capsule 5  . levothyroxine (SYNTHROID, LEVOTHROID) 50 MCG tablet Take 50 mcg by mouth daily.    . Multiple Vitamin (MULTIVITAMIN WITH MINERALS) TABS tablet Take 1 tablet by mouth daily.    . naproxen (NAPROSYN) 500 MG tablet Take 500 mg by mouth 2 (two) times daily with a meal.    . Oxycodone HCl 10 MG TABS Take by mouth 4 (four) times daily.    . Selenium 200 MCG TABS Take by mouth daily.    . simvastatin (ZOCOR)  40 MG tablet Take 1 tablet by mouth daily.    . sodium chloride (MURO 128) 5 % ophthalmic solution Place 3 drops into both eyes as needed for irritation.    . traZODone (DESYREL) 100 MG tablet Take 1 tablet (  100 mg total) by mouth at bedtime as needed for sleep. 90 tablet 0  . ziprasidone (GEODON) 60 MG capsule Take 1 capsule (60 mg total) by mouth at bedtime. 90 capsule 0  . ziprasidone (GEODON) 80 MG capsule Take 1 capsule (80 mg total) by mouth daily with breakfast. 90 capsule 0   No current facility-administered medications for this visit.      Musculoskeletal: Strength & Muscle Tone: within normal limits Gait & Station: normal Patient leans: N/A  Psychiatric Specialty Exam: ROS  There were no vitals taken for this visit.There is no height or weight on file to calculate BMI.  General Appearance: Fairly Groomed  Eye Contact:  Good  Speech:  Clear and Coherent  Volume:  Normal  Mood:  {BHH MOOD:22306}  Affect:  {Affect (PAA):22687}  Thought Process:  Coherent  Orientation:  Full (Time, Place, and Person)  Thought Content: Logical   Suicidal Thoughts:  {ST/HT (PAA):22692}  Homicidal Thoughts:  {ST/HT (PAA):22692}  Memory:  Immediate;   Good  Judgement:  {Judgement (PAA):22694}  Insight:  {Insight (PAA):22695}  Psychomotor Activity:  Normal  Concentration:  Concentration: Good and Attention Span: Good  Recall:  Good  Fund of Knowledge: Good  Language: Good  Akathisia:  No  Handed:  Right  AIMS (if indicated): not done  Assets:  Communication Skills Desire for Improvement  ADL's:  Intact  Cognition: WNL  Sleep:  {BHH GOOD/FAIR/POOR:22877}   Screenings: PHQ2-9     Office Visit from 07/28/2016 in [facility] Office Visit from 06/15/2016 in [facility]  PHQ-2 Total Score  6  6  PHQ-9 Total Score  14  14       Assessment and Plan:  Kristen Ryan is a 62 y.o. year old female with a history of bipolar I disorder, PTSD, COPD, IBS, neuropathy, who  presents for follow up appointment for No diagnosis found.  # Bipolar I disorder, mixed # PTSD (complex) # r/o dissociative identity disorder Although patient continues to report anxiety and neurovegetative symptoms in the context of loss of her father, she appears to cope well in the meatus of her grief.  Will continue Geodon to target mood dysregulation.  Will continue Cogentin for EPS given patient's strong preference.  She discontinued Ativan; will continue to hold this medication.  Will continue trazodone as needed for insomnia.  Discussed behavioral activation.  Discussed cognitive diffusion.  She is encouraged to continue to see Ms. Peggy for therapy.   Plan  1. Continueziprasidone 80 mg daily and 60 mg at night(QTc 450 msec on 03/16/2017) 2. Hold ativan 3. Continue benztropine 0.5 mg twice a day 4. Continue Trazodone 100 mg at night as needed for sleep 5.Return to clinicinone month for 30 mins - TSH checked by her PCP, Dr. Janna Arch 6. [facility]): [phone] [address] [place], [place] [address]  The patient demonstrates the following risk factors for suicide: Chronic risk factors for suicide include: psychiatric disorder of bipolar disorderand chronic pain. Acute risk factorsfor suicide include: loss (financial, interpersonal, professional). Protective factorsfor this patient include: positive social support, coping skills and hope for the future. Considering these factors, the overall suicide risk at this point appears to be low. Patient isappropriate for outpatient follow up.     Neysa Hotter, MD 12/30/2017, 2:37 PM

## 2018-01-04 ENCOUNTER — Ambulatory Visit (HOSPITAL_COMMUNITY): Payer: Medicare Other | Admitting: Psychiatry

## 2018-01-05 NOTE — Progress Notes (Signed)
BH MD/PA/NP OP Progress Note  01/11/2018 2:07 PM CHANIKA BYLAND  MRN:  [id number]  Chief Complaint:  Chief Complaint    Depression; Follow-up; Other     HPI:  Patient presents for follow-up appointment for bipolar disorder.  She states that she wants to be off all the medication.  She states that her brother does not think she has bipolar disorder or schizophrenia.  She wants to be off medication as the medication is not helping for her.  She is in "deep" depression, has crying spells, stays in the bed all day long. She has not taken care of her animals. Her husband does cooking as she does not do it anymore. She feels stressed about her step son, who lives with his girlfriend at her house. Her son is working outside of the town and he is doing well. She states that she has not taken ativan for the past few months. However, PMP indicates that she did receive ativan refill last month. When she is asked about this, she states that she did receive a refill without knowing that she discontinued it; she has not taken ativan. She is adamant not to decrease cogentin, stating that it helps for "akathisia." She states that she used to be on "60 mg" before and it worked well for her. She is somehow resistant to the information that this medication would not be given that high dose. She later asks that if she needs to continue cogentin as she wants to be off this medication.   She sleeps 10 hours.  She feels tired.  She has fair concentration.  She denies SI.  She feels anxious and tense.  She denies  panic attacks.  She has hypervigilance.  She denies decreased need for sleep or euphoria.   Per PMP,  Lorazepam filled on 12/23/2017 On oxycodone  Visit Diagnosis:    ICD-10-CM   1. PTSD (post-traumatic stress disorder) F43.10   2. Bipolar 1 disorder, mixed, moderate (HCC) F31.62     Past Psychiatric History: Please see initial evaluation for full details. I have reviewed the history. No updates at this  time.     Past Medical History:  Past Medical History:  Diagnosis Date  . Anemia   . Asthma   . Bipolar 1 disorder (HCC)   . Bronchitis   . COPD (chronic obstructive pulmonary disease) (HCC)   . Diverticulitis   . IBS (irritable bowel syndrome)   . Neuropathy   . Schizophrenia (HCC)   . Thyroid disease     Past Surgical History:  Procedure Laterality Date  . ABDOMINAL HYSTERECTOMY    . broke left arm    . CESAREAN SECTION    . COLONOSCOPY N/A 08/30/2014   Procedure: COLONOSCOPY;  Surgeon: Malissa Hippo, MD;  [facility];  Service: Endoscopy;  Laterality: N/A;  1200  . ELBOW SURGERY    . FOOT SURGERY    . KNEE SURGERY    . TONSILLECTOMY      Family Psychiatric History: Please see initial evaluation for full details. I have reviewed the history. No updates at this time.     Family History:  Family History  Problem Relation Age of Onset  . Dementia Mother   . Bipolar disorder Mother   . Dementia Father   . Other Son        MVA accident    Social History:  Social History   Socioeconomic History  . Marital status: Married  Spouse name: Not on file  . Number of children: Not on file  . Years of education: Not on file  . Highest education level: Not on file  Occupational History  . Not on file  Social Needs  . Financial resource strain: Not on file  . Food insecurity:    Worry: Not on file    Inability: Not on file  . Transportation needs:    Medical: Not on file    Non-medical: Not on file  Tobacco Use  . Smoking status: Former Smoker    Packs/day: 4.00    Types: Cigarettes    Last attempt to quit: 07/06/2008    Years since quitting: 9.5  . Smokeless tobacco: Never Used  Substance and Sexual Activity  . Alcohol use: No    Comment: 08-27-2016 per pt not no more. per pt she stopped 1987  . Drug use: No    Comment: 08-27-2016 per pt no  . Sexual activity: Yes    Birth control/protection: Surgical    Comment: hyst  Lifestyle  . Physical  activity:    Days per week: Not on file    Minutes per session: Not on file  . Stress: Not on file  Relationships  . Social connections:    Talks on phone: Not on file    Gets together: Not on file    Attends religious service: Not on file    Active member of club or organization: Not on file    Attends meetings of clubs or organizations: Not on file    Relationship status: Not on file  Other Topics Concern  . Not on file  Social History Narrative  . Not on file    Allergies:  Allergies  Allergen Reactions  . Aminophylline Anaphylaxis  . Sumatriptan Anaphylaxis and Other (See Comments)    Causes fainting  . Theophylline Anaphylaxis  . Codeine Itching and Other (See Comments)    Too strong for patient   . Depakote [Divalproex Sodium] Other (See Comments)    Hair loss  . Divalproex Sodium Other (See Comments)    Causes hair to fall out  . Doxycycline Other (See Comments)    headache  . Lamictal [Lamotrigine]     Destroys thyroid  . Lithium Other (See Comments)    Adverse reaction to Thyroid   . Magnesium-Containing Compounds Hives  . Metronidazole   . Nabumetone Swelling  . Naproxen Swelling  . Other     Hair dye  . Penicillins Nausea And Vomiting    Has patient had a PCN reaction causing immediate rash, facial/tongue/throat swelling, SOB or lightheadedness with hypotension: Yes Has patient had a PCN reaction causing severe rash involving mucus membranes or skin necrosis: No Has patient had a PCN reaction that required hospitalization Yes Has patient had a PCN reaction occurring within the last 10 years: No If all of the above answers are "NO", then may proceed with Cephalosporin use.   Marland Kitchen Pentazocine Lactate Other (See Comments)    Unknown reaction  . Risperidone Other (See Comments)    Insomnia   . Seroquel [Quetiapine] Swelling  . Tegretol [Carbamazepine] Swelling  . Clonidine Derivatives     Dizziness at 0.1 mg  . Tetracycline Rash    Headaches  . Zyprexa  [Olanzapine] Rash and Other (See Comments)    Insomnia    Metabolic Disorder Labs: No results found for: HGBA1C, MPG No results found for: PROLACTIN No results found for: CHOL, TRIG, HDL, CHOLHDL, VLDL, LDLCALC Lab  Results  Component Value Date   TSH 0.888 07/07/2010    Therapeutic Level Labs: Lab Results  Component Value Date   LITHIUM 1.27 10/25/2013   No results found for: VALPROATE No components found for:  CBMZ  Current Medications: Current Outpatient Medications  Medication Sig Dispense Refill  . albuterol (PROVENTIL) (2.5 MG/3ML) 0.083% nebulizer solution Take 3 mLs (2.5 mg total) by nebulization every 6 (six) hours as needed for wheezing. 75 mL 12  . Albuterol Sulfate (PROVENTIL HFA IN) Inhale 1-2 puffs into the lungs daily.     Marland Kitchen aspirin 325 MG tablet Take 325 mg by mouth daily.    . benztropine (COGENTIN) 0.5 MG tablet Take 1 tablet (0.5 mg total) by mouth 2 (two) times daily. 180 tablet 0  . budesonide-formoterol (SYMBICORT) 160-4.5 MCG/ACT inhaler Inhale 2 puffs into the lungs 2 (two) times daily. 1 Inhaler 1  . cetirizine (ZYRTEC) 10 MG tablet Take 10 mg by mouth daily.    Marland Kitchen dicyclomine (BENTYL) 10 MG capsule TAKE 1 CAPSULE BY MOUTH THREE TIMES DAILY AS NEEDED FOR SPASMS. 90 capsule 5  . gabapentin (NEURONTIN) 300 MG capsule Take 300 mg by mouth 2 (two) times daily.    Marland Kitchen levothyroxine (SYNTHROID, LEVOTHROID) 50 MCG tablet Take 50 mcg by mouth daily.    . Multiple Vitamin (MULTIVITAMIN WITH MINERALS) TABS tablet Take 1 tablet by mouth daily.    . naproxen (NAPROSYN) 500 MG tablet Take 500 mg by mouth 2 (two) times daily with a meal.    . Oxycodone HCl 10 MG TABS Take by mouth 4 (four) times daily.    . Selenium 200 MCG TABS Take by mouth daily.    . simvastatin (ZOCOR) 40 MG tablet Take 1 tablet by mouth daily.    . sodium chloride (MURO 128) 5 % ophthalmic solution Place 3 drops into both eyes as needed for irritation.    . traZODone (DESYREL) 100 MG tablet Take 1  tablet (100 mg total) by mouth at bedtime as needed for sleep. 90 tablet 0  . ziprasidone (GEODON) 60 MG capsule Take 1 capsule (60 mg total) by mouth 2 (two) times daily. 60 capsule 0   No current facility-administered medications for this visit.      Musculoskeletal: Strength & Muscle Tone: within normal limits Gait & Station: normal Patient leans: N/A  Psychiatric Specialty Exam: Review of Systems  Psychiatric/Behavioral: Positive for depression. Negative for hallucinations, memory loss, substance abuse and suicidal ideas. The patient is nervous/anxious. The patient does not have insomnia.   All other systems reviewed and are negative.   Blood pressure 124/77, pulse 75, height 5\' 8"  (1.727 m), weight 124 lb (56.2 kg), SpO2 98 %.Body mass index is 18.85 kg/m.  General Appearance: Fairly Groomed  Eye Contact:  Good  Speech:  Clear and Coherent  Volume:  Normal  Mood:  Depressed  Affect:  Appropriate, Congruent and Restricted  Thought Process:  Coherent  Orientation:  Full (Time, Place, and Person)  Thought Content: Logical   Suicidal Thoughts:  No  Homicidal Thoughts:  No  Memory:  Immediate;   Good  Judgement:  Fair  Insight:  Shallow  Psychomotor Activity:  Normal  Concentration:  Concentration: Good and Attention Span: Good  Recall:  Good  Fund of Knowledge: Good  Language: Good  Akathisia:  No  Handed:  Right  AIMS (if indicated): not done  Assets:  Communication Skills Desire for Improvement  ADL's:  Intact  Cognition: WNL  Sleep:  Good   Screenings: PHQ2-9     [facility] Office Visit from 06/15/2016 in [facility]  PHQ-2 Total Score  6  6  PHQ-9 Total Score  14  14       Assessment and Plan:  Liz BeachColette J Janelle is a 62 y.o. year old female with a history of bipolar I disorder, PTSD, COPD, IBS, neuropathy, who presents for follow up appointment for PTSD (post-traumatic stress disorder)  Bipolar 1 disorder,  mixed, moderate (HCC)  # Bipolar I disorder, mixed # PTSD (complex) # r/o dissociative identity disorder Exam is notable for some inconsistency in her history (likely not volitional), incongruent affect (does not correlate with her reported  symptoms of severe depression) , and the patient endorses neurovegetative symptoms, which has worsened since the last visit.  However, she politely request her medication to be discontinued. She had adverse reaction to many medication in the past, and alternative options are limited. After having discussed in details about potential risk of relapse in her mood symptoms, will taper down Geodon to minimize any potential side effect. Will continue cogentin for EPS given patient strong preference. Will continue trazodone prn for insomnia. Discussed behavioral activation. Although she will greatly benefit from IOP, she is not interested in this option.   Plan 1. Decrease ziprasidone 60 mg twice a day (QTc 450 msec on 03/16/2017) 2. Continue benztropine 0.5 mg twice a day 3. Continue Trazodone 100 mg at night as needed for sleep 4.Return to clinicinone month for 30 mins - TSH checked by her PCP, Dr. Janna Archondiego  Past trials of medication: sertraline (weight gain), fluoxetine (SI), Lexapro, Effexor (headache), duloxetine (rash, headache), mirtazapine (rash), Paxil, Depakote, lithium (thyroid issues), carbamazepine (swelling), lamotrigine (swelling, headache, crying spells), quetiapine (swelling of throat,rash per patient), Geodon, Xanax, clonazepam, clonidine (dizziness), hydroxyzine (limited benefit)  The patient demonstrates the following risk factors for suicide: Chronic risk factors for suicide include: psychiatric disorder of bipolar disorderand chronic pain. Acute risk factorsfor suicide include: loss (financial, interpersonal, professional). Protective factorsfor this patient include: positive social support, coping skills and hope for the future. Considering  these factors, the overall suicide risk at this point appears to be low. Patient isappropriate for outpatient follow up.    Neysa Hottereina Tanyah Debruyne, MD 01/11/2018, 2:07 PM

## 2018-01-10 ENCOUNTER — Ambulatory Visit (INDEPENDENT_AMBULATORY_CARE_PROVIDER_SITE_OTHER): Payer: Medicare Other | Admitting: Psychiatry

## 2018-01-10 DIAGNOSIS — F3162 Bipolar disorder, current episode mixed, moderate: Secondary | ICD-10-CM | POA: Diagnosis not present

## 2018-01-10 DIAGNOSIS — F4310 Post-traumatic stress disorder, unspecified: Secondary | ICD-10-CM

## 2018-01-10 NOTE — Progress Notes (Signed)
Patient:  Kristen Ryan   DOB: 05/07/1956  MR Number: [id number]  [facility]:  [address] [place],  [place], [id number]  Start: Monday 01/10/2018 2:10 PM  End: Monday 01/10/2018 3:00 PM                  Provider/Observer:     Florencia ReasonsPeggy Quinita Kostelecky, MSW, LCSW   Chief Complaint:      Chief Complaint  Patient presents with  . Depression    Reason For Service:     Kristen Ryan is a 62 y.o. female who is referred for services by psychiatrist Dr. Vanetta ShawlHisada. She reports having a lot of stress related to father having Alzheimer's disease. She also reports stress related to son as he has ADHD and bipolar disorder but has problems except and his condition. She says he yells at her a lot as he becomes upset taking care of her father. Patient reports a long-standing history of symptoms of bipolar disorder and schizophrenia.  Interventions Strategy:  Supportive/CBT/  Participation Level:               Active  Participation Quality:  Appropriate     Behavioral Observation:  Casual, Alert, and talkative  Current Psychosocial Factors: father died 08/13/2017  Content of Session:    Reviewed symptoms, discussed stressors,  facilitated expression of thoughts and feelings, validated feelings, assisted patient explore options regarding contacting medical provider about issues related to diverticulitis and encouraged patient to contact doctor's office today,  normalized feelings of sadness regarding loss of father, began to review stages of grief and connection to depression,  discussed the importance of medication compliance, assisted patient identify ways to express concerns about medication to psychiatrist,  Current Status:    depressed mood, loss of appetite, sleep difficulty due to pain waking her up during the night, restlessness, worry,   Suicidal/Homicidal:    No  Patient Progress:    Patient last was seen about 2 months ago. She reports increased stress as she says her 62 yo  stepson, his 62 yo girlfriend, and her husband's cousin moved in with her and her family about 3 weeks ago. She says it feels chaotic in her home as stepson leaves the house without notifying her and doesn't inform her of his return.  She says the girlfriend is gone for days at a time. She reports husband's cousin is a heavy drinker and uses profanity. She reports continued grief and loss issues regarding father and says she cried all day on Father's day. She states feeling depressed and crying a lot. She says the medication isn't helping like her previous medication and  states wanting to stop taking medication because she is still depressed. She is scheduled to see psychiatrist Dr. Vanetta ShawlHisada tomorrow. Patient also reports sleep difficulty due to waking up during the night due to pain from diverticulitis. Per her report, her doctor is on vacation and she is not scheduled to see him again until the fourth week in July. She is hesitant to seek medical attention as she fears she will be hospitalized. She does agree to contact his office today to seek help from the person who is covering his cases.  Target Goals:   1.  Learn and implement calming techniques and coping strategies as part of an overall approach to managing anger.    2. Verbalize an understanding of assertive communication how it can be used to express thoughts and feelings of anger in a controlled respectful  way.  Last Reviewed:   01/11/2017  Goals Addressed Today:      Plan:      Return again in 2  weeks.  Impression/Diagnosis:    Diagnosis:  Axis I:   Bipolar 1 Disorder          Axis II: Deferred Tavarious Freel, LCSW 01/10/2018

## 2018-01-11 ENCOUNTER — Encounter (HOSPITAL_COMMUNITY): Payer: Self-pay | Admitting: Psychiatry

## 2018-01-11 ENCOUNTER — Ambulatory Visit (INDEPENDENT_AMBULATORY_CARE_PROVIDER_SITE_OTHER): Payer: Medicare Other | Admitting: Psychiatry

## 2018-01-11 VITALS — BP 124/77 | HR 75 | Ht 68.0 in | Wt 124.0 lb

## 2018-01-11 DIAGNOSIS — F3162 Bipolar disorder, current episode mixed, moderate: Secondary | ICD-10-CM

## 2018-01-11 DIAGNOSIS — F4310 Post-traumatic stress disorder, unspecified: Secondary | ICD-10-CM | POA: Diagnosis not present

## 2018-01-11 MED ORDER — ZIPRASIDONE HCL 60 MG PO CAPS: 60.0000 mg | ORAL_CAPSULE | Freq: Two times a day (BID) | ORAL | 0 refills | Status: DC

## 2018-01-11 NOTE — Patient Instructions (Signed)
1. Decrease ziprasidone 60 mg twice a day (QTc 450 msec on 03/16/2017) 2. Continue benztropine 0.5 mg twice a day 3. Continue Trazodone 100 mg at night as needed for sleep 4.Return to clinicinone month for 30 mins

## 2018-01-24 ENCOUNTER — Ambulatory Visit (INDEPENDENT_AMBULATORY_CARE_PROVIDER_SITE_OTHER): Payer: Medicare Other | Admitting: Psychiatry

## 2018-01-24 DIAGNOSIS — F4310 Post-traumatic stress disorder, unspecified: Secondary | ICD-10-CM | POA: Diagnosis not present

## 2018-01-24 DIAGNOSIS — F3162 Bipolar disorder, current episode mixed, moderate: Secondary | ICD-10-CM

## 2018-01-24 NOTE — Progress Notes (Signed)
Patient:  Kristen Ryan   DOB: 02-08-1956  MR Number: [id number]  Location[facility]:  [address] [place],  [place], [id number]  Start: Monday 01/24/2018 1:10 PM  End: Monday 01/24/2018 2:00 PM                Provider/Observer:     Florencia ReasonsPeggy Johm Pfannenstiel, MSW, LCSW   Chief Complaint:      Depression  Reason For Service:     Kristen Ryan is a 62 y.o. female who is referred for services by psychiatrist Dr. Vanetta ShawlHisada. She reports having a lot of stress related to father having Alzheimer's disease. She also reports stress related to son as he has ADHD and bipolar disorder but has problems except and his condition. She says he yells at her a lot as he becomes upset taking care of her father. Patient reports a long-standing history of symptoms of bipolar disorder and schizophrenia.  Interventions Strategy:  Supportive/CBT/  Participation Level:               Active  Participation Quality:  Appropriate     Behavioral Observation:  Casual, Alert, and talkative  Current Psychosocial Factors: father died 08/13/2017  Content of Session:    Reviewed symptoms, discussed stressors, facilitated expression of thoughts and feelings regarding grief and loss issues, validated feelings, discussed uncomplicated grief versus complicated grief, also discussed acute grief and integrated grief, normalized patient's feelings of sadness regarding grief and loss issues, discussed use of her support system, praised and reinforced patient's initiative regarding getting contact information about a grief support group, encouraged patient to attend group, discussed stages of grief and provided handout, discussed medication compliance,  Current Status:    depressed mood, loss of appetite, sleep difficulty due to pain waking her up during the night, restlessness, worry,   Suicidal/Homicidal:    No  Patient Progress:    Patient last was seen about 2 weeksago. She reports increased thoughts and memories about  deceased father which appears to have been triggered by a phone call from patient's brother. Per patient's report, her brother informed her he has sold their father's car and has a potential buyer for their father's home. Patient reports continued sadness and tearfulness. She has learned about a grief support group and plans to attend the first session tomorrow night. She reports less stress at home regarding living situation as her husband's uncle moved out yesterday. Although she disagrees with her stepson and his girlfriend living together in her home, she expresses increased acceptance as she knows her husband will not speak against it per her report. She also expresses less worry about her son who has been working away from home for 4 weeks. Per her report. They have regular phone conversations and his attitude has changed for the better. Patient reports auditory hallucinations of muffled chatter. She reports no command hallucinations. She  reports discussing medication concerns with Dr. Vanetta ShawlHisada and reports being compliant with medication. Patient has started to resume interest in some activities and responsibilities such as cooking and washing the dishes.   Target Goals:   1.  Learn and implement calming techniques and coping strategies as part of an overall approach to managing anger.    2. Verbalize an understanding of assertive communication how it can be used to express thoughts and feelings of anger in a controlled respectful  way.  Last Reviewed:   01/11/2017  Goals Addressed Today:      Plan:  Return again in 2  weeks.  Impression/Diagnosis:    Diagnosis:  Axis I:   Bipolar 1 Disorder          Axis II: Deferred Kylee Nardozzi, LCSW 01/24/2018

## 2018-02-07 ENCOUNTER — Ambulatory Visit (HOSPITAL_COMMUNITY): Payer: Medicare Other | Admitting: Psychiatry

## 2018-02-08 NOTE — Progress Notes (Signed)
[facility] MD/PA/NP OP Progress Note  02/22/2018 2:02 PM Kristen Ryan  MRN:  [id number]  Chief Complaint:  Chief Complaint    Other; Trauma     HPI:  Patient presents for follow up appointment for bipolar I disorder.  She states that she has been doing better after she was prescribed Paxil by her PCP.  She also does not think about her father as she used to.  She reports good relationship with her husband.  She enjoys going outside with him together.  She believes that her son, who is current treatment for monitor for work is doing better as well.  She complains of occasional headache and nausea.  She agrees to be followed by her PCP for potential migraine. She sleeps 10 hours.  She feels less depressed.  She denies fatigue.  She has fair concentration.  She denies SI.  She denies decreased need for sleep or euphoria.  She feels less anxious.  She denies panic attacks.  She is now comfortable discontinuing Cogentin.   Visit Diagnosis:    ICD-10-CM   1. PTSD (post-traumatic stress disorder) F43.10   2. Bipolar 1 disorder, mixed, moderate (HCC) F31.62     Past Psychiatric History: Please see initial evaluation for full details. I have reviewed the history. No updates at this time.     Past Medical History:  Past Medical History:  Diagnosis Date  . Anemia   . Asthma   . Bipolar 1 disorder (HCC)   . Bronchitis   . COPD (chronic obstructive pulmonary disease) (HCC)   . Diverticulitis   . IBS (irritable bowel syndrome)   . Neuropathy   . Schizophrenia (HCC)   . Thyroid disease     Past Surgical History:  Procedure Laterality Date  . ABDOMINAL HYSTERECTOMY    . broke left arm    . CESAREAN SECTION    . COLONOSCOPY N/A 08/30/2014   Procedure: COLONOSCOPY;  Surgeon: Malissa HippoNajeeb U Rehman, MD;  Location: [facility];  Service: Endoscopy;  Laterality: N/A;  1200  . ELBOW SURGERY    . FOOT SURGERY    . KNEE SURGERY    . TONSILLECTOMY      Family Psychiatric History: Please see initial  evaluation for full details. I have reviewed the history. No updates at this time.     Family History:  Family History  Problem Relation Age of Onset  . Dementia Mother   . Bipolar disorder Mother   . Dementia Father   . Other Son        MVA accident    Social History:  Social History   Socioeconomic History  . Marital status: Married    Spouse name: Not on file  . Number of children: Not on file  . Years of education: Not on file  . Highest education level: Not on file  Occupational History  . Not on file  Social Needs  . Financial resource strain: Not on file  . Food insecurity:    Worry: Not on file    Inability: Not on file  . Transportation needs:    Medical: Not on file    Non-medical: Not on file  Tobacco Use  . Smoking status: Former Smoker    Packs/day: 4.00    Types: Cigarettes    Last attempt to quit: 07/06/2008    Years since quitting: 9.6  . Smokeless tobacco: Never Used  Substance and Sexual Activity  . Alcohol use: No    Comment: 08-27-2016  per pt not no more. per pt she stopped 1987  . Drug use: No    Comment: 08-27-2016 per pt no  . Sexual activity: Yes    Birth control/protection: Surgical    Comment: hyst  Lifestyle  . Physical activity:    Days per week: Not on file    Minutes per session: Not on file  . Stress: Not on file  Relationships  . Social connections:    Talks on phone: Not on file    Gets together: Not on file    Attends religious service: Not on file    Active member of club or organization: Not on file    Attends meetings of clubs or organizations: Not on file    Relationship status: Not on file  Other Topics Concern  . Not on file  Social History Narrative  . Not on file    Allergies:  Allergies  Allergen Reactions  . Aminophylline Anaphylaxis  . Sumatriptan Anaphylaxis and Other (See Comments)    Causes fainting  . Theophylline Anaphylaxis  . Codeine Itching and Other (See Comments)    Too strong for patient    . Depakote [Divalproex Sodium] Other (See Comments)    Hair loss  . Divalproex Sodium Other (See Comments)    Causes hair to fall out  . Doxycycline Other (See Comments)    headache  . Lamictal [Lamotrigine]     Destroys thyroid  . Lithium Other (See Comments)    Adverse reaction to Thyroid   . Magnesium-Containing Compounds Hives  . Metronidazole   . Nabumetone Swelling  . Naproxen Swelling  . Other     Hair dye  . Penicillins Nausea And Vomiting    Has patient had a PCN reaction causing immediate rash, facial/tongue/throat swelling, SOB or lightheadedness with hypotension: Yes Has patient had a PCN reaction causing severe rash involving mucus membranes or skin necrosis: No Has patient had a PCN reaction that required hospitalization Yes Has patient had a PCN reaction occurring within the last 10 years: No If all of the above answers are "NO", then may proceed with Cephalosporin use.   Marland Kitchen Pentazocine Lactate Other (See Comments)    Unknown reaction  . Risperidone Other (See Comments)    Insomnia   . Seroquel [Quetiapine] Swelling  . Tegretol [Carbamazepine] Swelling  . Clonidine Derivatives     Dizziness at 0.1 mg  . Tetracycline Rash    Headaches  . Zyprexa [Olanzapine] Rash and Other (See Comments)    Insomnia    Metabolic Disorder Labs: No results found for: HGBA1C, MPG No results found for: PROLACTIN No results found for: CHOL, TRIG, HDL, CHOLHDL, VLDL, LDLCALC Lab Results  Component Value Date   TSH 0.888 07/07/2010    Therapeutic Level Labs: Lab Results  Component Value Date   LITHIUM 1.27 10/25/2013   No results found for: VALPROATE No components found for:  CBMZ  Current Medications: Current Outpatient Medications  Medication Sig Dispense Refill  . albuterol (PROVENTIL) (2.5 MG/3ML) 0.083% nebulizer solution Take 3 mLs (2.5 mg total) by nebulization every 6 (six) hours as needed for wheezing. 75 mL 12  . Albuterol Sulfate (PROVENTIL HFA IN) Inhale  1-2 puffs into the lungs daily.     Marland Kitchen aspirin 325 MG tablet Take 325 mg by mouth daily.    . benztropine (COGENTIN) 0.5 MG tablet Take 1 tablet (0.5 mg total) by mouth 2 (two) times daily. 180 tablet 0  . budesonide-formoterol (SYMBICORT) 160-4.5 MCG/ACT inhaler  Inhale 2 puffs into the lungs 2 (two) times daily. 1 Inhaler 1  . cetirizine (ZYRTEC) 10 MG tablet Take 10 mg by mouth daily.    Marland Kitchen dicyclomine (BENTYL) 10 MG capsule TAKE 1 CAPSULE BY MOUTH THREE TIMES DAILY AS NEEDED FOR SPASMS. 90 capsule 5  . gabapentin (NEURONTIN) 300 MG capsule Take 300 mg by mouth 2 (two) times daily.    Marland Kitchen levothyroxine (SYNTHROID, LEVOTHROID) 50 MCG tablet Take 50 mcg by mouth daily.    . Multiple Vitamin (MULTIVITAMIN WITH MINERALS) TABS tablet Take 1 tablet by mouth daily.    . naproxen (NAPROSYN) 500 MG tablet Take 500 mg by mouth 2 (two) times daily with a meal.    . Oxycodone HCl 10 MG TABS Take by mouth 4 (four) times daily.    Marland Kitchen PARoxetine (PAXIL) 20 MG tablet Take 20 mg by mouth daily.    . Selenium 200 MCG TABS Take by mouth daily.    . simvastatin (ZOCOR) 40 MG tablet Take 1 tablet by mouth daily.    . sodium chloride (MURO 128) 5 % ophthalmic solution Place 3 drops into both eyes as needed for irritation.    . traZODone (DESYREL) 100 MG tablet Take 1 tablet (100 mg total) by mouth at bedtime as needed for sleep. 90 tablet 0  . ziprasidone (GEODON) 60 MG capsule Take 1 capsule (60 mg total) by mouth 2 (two) times daily. 180 capsule 0   No current facility-administered medications for this visit.      Musculoskeletal: Strength & Muscle Tone: within normal limits Gait & Station: normal Patient leans: N/A  Psychiatric Specialty Exam: Review of Systems  Psychiatric/Behavioral: Negative for depression, hallucinations, memory loss, substance abuse and suicidal ideas. The patient is not nervous/anxious and does not have insomnia.   All other systems reviewed and are negative.   Blood pressure 128/74,  pulse 82, height 5\' 8"  (1.727 m), weight 123 lb (55.8 kg), SpO2 96 %.Body mass index is 18.7 kg/m.  General Appearance: Fairly Groomed  Eye Contact:  Good  Speech:  Clear and Coherent  Volume:  Normal  Mood:  "better"  Affect:  Appropriate, Congruent and euthymic, smiles  Thought Process:  Coherent  Orientation:  Full (Time, Place, and Person)  Thought Content: Logical   Suicidal Thoughts:  No  Homicidal Thoughts:  No  Memory:  Immediate;   Good  Judgement:  Good  Insight:  Fair  Psychomotor Activity:  Normal  Concentration:  Concentration: Good and Attention Span: Good  Recall:  Good  Fund of Knowledge: Good  Language: Good  Akathisia:  No  Handed:  Right  AIMS (if indicated): not done  Assets:  Communication Skills Desire for Improvement  ADL's:  Intact  Cognition: WNL  Sleep:  Good   Screenings: PHQ2-9     Office Visit from 07/28/2016 in [facility] Office Visit from 06/15/2016 in [facility]  PHQ-2 Total Score  6  6  PHQ-9 Total Score  14  14       Assessment and Plan:  Kristen Ryan is a 61 y.o. year old female with a history of bipolar I disorder, PTSD, COPD,IBS, neuropathy , who presents for follow up appointment for PTSD (post-traumatic stress disorder)  Bipolar 1 disorder, mixed, moderate (HCC)  # bipolar I disorder, mixed # PTSD # r/o dissociative identity disorder Exam is notable for significant improvement in neurovegetative symptoms and anxiety, which coincided with started on Paxil by her PCP.  Will  continue Paxil at the current dose to target depression and anxiety, PTSD.  Will continue Geodon for mood dysregulation.  Discussed risk of EPS.  Will taper off Cogentin to avoid polypharmacy.  Will continue trazodone as needed for insomnia.   Plan 1. Continue Paxil 20 mg daily- prescribed by PCP 1. Continue ziprasidone 60 mg twice a day (QTc 450 msec on 03/16/2017) 2. Decrease benztropine 0.5 mg daily for one week, then discontinue 3.  Continue Trazodone 100 mg at night as needed for sleep 4.Return to clinicinth[facility] for 15 mins - TSH checked by her PCP, Dr. Janna Arch - on gabapentin 300 mg BID  Past trials of medication: sertraline (weight gain), fluoxetine (SI), Lexapro, Effexor (headache), duloxetine (rash, headache), mirtazapine (rash), Paxil, Depakote, lithium (thyroid issues), carbamazepine (swelling), lamotrigine (swelling, headache, crying spells), quetiapine (swelling of throat,rash per patient), Geodon, Xanax, clonazepam, clonidine (dizziness), hydroxyzine (limited benefit)  The patient demonstrates the following risk factors for suicide: Chronic risk factors for suicide include: psychiatric disorder of bipolar disorderand chronic pain. Acute risk factorsfor suicide include: loss (financial, interpersonal, professional). Protective factorsfor this patient include: positive social support, coping skills and hope for the future. Considering these factors, the overall suicide risk at this point appears to be low. Patient isappropriate for outpatient follow up.  Neysa Hotter, MD 02/22/2018, 2:02 PM

## 2018-02-21 ENCOUNTER — Ambulatory Visit (HOSPITAL_COMMUNITY): Payer: Medicare Other | Admitting: Psychiatry

## 2018-02-22 ENCOUNTER — Encounter (HOSPITAL_COMMUNITY): Payer: Self-pay | Admitting: Psychiatry

## 2018-02-22 ENCOUNTER — Ambulatory Visit (INDEPENDENT_AMBULATORY_CARE_PROVIDER_SITE_OTHER): Payer: Medicare Other | Admitting: Psychiatry

## 2018-02-22 VITALS — BP 128/74 | HR 82 | Ht 68.0 in | Wt 123.0 lb

## 2018-02-22 DIAGNOSIS — F4310 Post-traumatic stress disorder, unspecified: Secondary | ICD-10-CM

## 2018-02-22 DIAGNOSIS — Z818 Family history of other mental and behavioral disorders: Secondary | ICD-10-CM

## 2018-02-22 DIAGNOSIS — F3162 Bipolar disorder, current episode mixed, moderate: Secondary | ICD-10-CM | POA: Diagnosis not present

## 2018-02-22 DIAGNOSIS — Z87891 Personal history of nicotine dependence: Secondary | ICD-10-CM | POA: Diagnosis not present

## 2018-02-22 MED ORDER — ZIPRASIDONE HCL 60 MG PO CAPS: 60.0000 mg | ORAL_CAPSULE | Freq: Two times a day (BID) | ORAL | 0 refills | Status: DC

## 2018-02-22 MED ORDER — TRAZODONE HCL 100 MG PO TABS: 100.0000 mg | ORAL_TABLET | Freq: Every evening | ORAL | 0 refills | Status: DC | PRN

## 2018-02-22 NOTE — Patient Instructions (Addendum)
1. Continue ziprasidone 60 mg twice a day  2. Decrease benztropine 0.5 mg daily for one week, then discontinue 3. Continue Trazodone 100 mg at night as needed for sleep 4.Return to clinicinthree month for 15 mins

## 2018-03-15 ENCOUNTER — Ambulatory Visit (HOSPITAL_COMMUNITY): Payer: Self-pay | Admitting: Psychiatry

## 2018-03-29 ENCOUNTER — Telehealth: Payer: Self-pay | Admitting: *Deleted

## 2018-03-29 ENCOUNTER — Ambulatory Visit (HOSPITAL_COMMUNITY): Payer: Medicare Other | Admitting: Psychiatry

## 2018-03-29 ENCOUNTER — Ambulatory Visit (HOSPITAL_COMMUNITY): Payer: Self-pay | Admitting: Psychiatry

## 2018-03-29 MED ORDER — SULFAMETHOXAZOLE-TRIMETHOPRIM 800-160 MG PO TABS: 1.0000 | ORAL_TABLET | Freq: Two times a day (BID) | ORAL | 0 refills | Status: DC

## 2018-03-29 NOTE — Telephone Encounter (Signed)
Pt has bladder infection, requests meds, will rx septra ds, push fluids.She says is having back problems and decreased sex drive and will make appt.

## 2018-03-30 ENCOUNTER — Telehealth: Payer: Self-pay | Admitting: Adult Health

## 2018-03-30 MED ORDER — CIPROFLOXACIN HCL 500 MG PO TABS: 500.0000 mg | ORAL_TABLET | Freq: Two times a day (BID) | ORAL | 0 refills | Status: DC

## 2018-03-30 NOTE — Telephone Encounter (Signed)
Scott at [facility] called pt may be allergic to sulfa, will D/C septra ds and rx cipro.

## 2018-04-18 ENCOUNTER — Ambulatory Visit (HOSPITAL_COMMUNITY): Payer: Medicare Other | Admitting: Psychiatry

## 2018-04-18 ENCOUNTER — Ambulatory Visit (HOSPITAL_COMMUNITY): Payer: Self-pay | Admitting: Psychiatry

## 2018-04-21 ENCOUNTER — Other Ambulatory Visit (HOSPITAL_COMMUNITY): Payer: Self-pay | Admitting: Family Medicine

## 2018-04-21 DIAGNOSIS — H547 Unspecified visual loss: Secondary | ICD-10-CM

## 2018-04-25 ENCOUNTER — Ambulatory Visit (HOSPITAL_COMMUNITY)
Admission: RE | Admit: 2018-04-25 | Discharge: 2018-04-25 | Disposition: A | Discharge status: Home / Self Care | Payer: Medicare Other | Source: Ambulatory Visit | Attending: Family Medicine | Admitting: Family Medicine

## 2018-04-25 DIAGNOSIS — H547 Unspecified visual loss: Secondary | ICD-10-CM

## 2018-04-25 DIAGNOSIS — R930 Abnormal findings on diagnostic imaging of skull and head, not elsewhere classified: Secondary | ICD-10-CM | POA: Insufficient documentation

## 2018-05-02 ENCOUNTER — Ambulatory Visit (HOSPITAL_COMMUNITY): Payer: Medicare Other | Admitting: Psychiatry

## 2018-05-07 ENCOUNTER — Encounter (HOSPITAL_COMMUNITY): Payer: Self-pay | Admitting: Emergency Medicine

## 2018-05-07 ENCOUNTER — Other Ambulatory Visit: Payer: Self-pay

## 2018-05-07 ENCOUNTER — Emergency Department (HOSPITAL_COMMUNITY)
Admission: EM | Admit: 2018-05-07 | Discharge: 2018-05-07 | Disposition: A | Discharge status: Home / Self Care | Payer: Medicare Other | Attending: Emergency Medicine | Admitting: Emergency Medicine

## 2018-05-07 DIAGNOSIS — G43909 Migraine, unspecified, not intractable, without status migrainosus: Secondary | ICD-10-CM | POA: Diagnosis not present

## 2018-05-07 DIAGNOSIS — Z87891 Personal history of nicotine dependence: Secondary | ICD-10-CM | POA: Insufficient documentation

## 2018-05-07 DIAGNOSIS — J449 Chronic obstructive pulmonary disease, unspecified: Secondary | ICD-10-CM | POA: Insufficient documentation

## 2018-05-07 DIAGNOSIS — R51 Headache: Secondary | ICD-10-CM | POA: Diagnosis present

## 2018-05-07 HISTORY — DX: Migraine, unspecified, not intractable, without status migrainosus: G43.909

## 2018-05-07 LAB — BASIC METABOLIC PANEL
Anion gap: 7 (ref 5–15)
BUN: 11 mg/dL (ref 8–23)
CALCIUM: 9.4 mg/dL (ref 8.9–10.3)
CO2: 26 mmol/L (ref 22–32)
CREATININE: 0.84 mg/dL (ref 0.44–1.00)
Chloride: 103 mmol/L (ref 98–111)
GFR calc Af Amer: >60 mL/min (ref >60)
GFR calc non Af Amer: >60 mL/min (ref >60)
GLUCOSE: 121 mg/dL — AB (ref 70–99)
Potassium: 3.7 mmol/L (ref 3.5–5.1)
Sodium: 136 mmol/L (ref 135–145)

## 2018-05-07 MED ORDER — PROCHLORPERAZINE EDISYLATE 10 MG/2ML IJ SOLN
10.0000 mg | Freq: Once | INTRAMUSCULAR | Status: AC
  Administered 2018-05-07: 10 mg via INTRAMUSCULAR
  Filled 2018-05-07: qty 2

## 2018-05-07 MED ORDER — ALBUTEROL SULFATE HFA 108 (90 BASE) MCG/ACT IN AERS: 2.0000 | INHALATION_SPRAY | Freq: Once | RESPIRATORY_TRACT | Status: DC

## 2018-05-07 MED ORDER — DEXAMETHASONE SODIUM PHOSPHATE 4 MG/ML IJ SOLN
10.0000 mg | Freq: Once | INTRAMUSCULAR | Status: AC
  Administered 2018-05-07: 10 mg via INTRAMUSCULAR
  Filled 2018-05-07: qty 3

## 2018-05-07 MED ORDER — PROCHLORPERAZINE MALEATE 10 MG PO TABS: 10.0000 mg | ORAL_TABLET | Freq: Two times a day (BID) | ORAL | 0 refills | Status: DC | PRN

## 2018-05-07 MED ORDER — METOCLOPRAMIDE HCL 5 MG/ML IJ SOLN
10.0000 mg | Freq: Once | INTRAMUSCULAR | Status: AC
  Administered 2018-05-07: 10 mg via INTRAMUSCULAR
  Filled 2018-05-07: qty 2

## 2018-05-07 MED ORDER — KETOROLAC TROMETHAMINE 30 MG/ML IJ SOLN
30.0000 mg | Freq: Once | INTRAMUSCULAR | Status: AC
  Administered 2018-05-07: 30 mg via INTRAMUSCULAR
  Filled 2018-05-07: qty 1

## 2018-05-07 MED ORDER — ALBUTEROL SULFATE HFA 108 (90 BASE) MCG/ACT IN AERS: 1.0000 | INHALATION_SPRAY | Freq: Four times a day (QID) | RESPIRATORY_TRACT | 0 refills | Status: DC | PRN

## 2018-05-07 MED ORDER — DIPHENHYDRAMINE HCL 25 MG PO CAPS
25.0000 mg | ORAL_CAPSULE | Freq: Once | ORAL | Status: AC
  Administered 2018-05-07: 25 mg via ORAL
  Filled 2018-05-07: qty 1

## 2018-05-07 NOTE — ED Provider Notes (Signed)
Emergency Department Provider Note   I have reviewed the triage vital signs and the nursing notes.   HISTORY  Chief Complaint Migraine   HPI Kristen Ryan is a 62 y.o. female with PMH of Bipolar disorder, COPD, Schizophrenia, and migraine HA resents to the emergency department for evaluation of migraine type headache.  The patient states that her current headache has been ongoing and progressively worsening for the past 29 days.  She reports some blurry vision with intermittent vision loss in the left eye.  She states that the symptoms are typical of her migraine headaches.  She denies any sudden onset, maximal intensity headache symptoms.  She does experience some intermittent cramping in her hands but denies cramping pain in other locations.  She has been taking Excedrin Migraine at home with no relief.  She is having some photosensitivity.  No fevers or chills.  No head trauma.  She did have a CT scan on 10/21 with her primary care physician which showed no acute abnormality.  She does not follow with a neurologist.  Past Medical History:  Diagnosis Date  . Anemia   . Asthma   . Bipolar 1 disorder (HCC)   . Bronchitis   . COPD (chronic obstructive pulmonary disease) (HCC)   . Diverticulitis   . IBS (irritable bowel syndrome)   . Migraine   . Neuropathy   . Schizophrenia (HCC)   . Thyroid disease     Patient Active Problem List   Diagnosis Date Noted  . PTSD (post-traumatic stress disorder) 12/11/2016  . Bipolar 1 disorder, mixed, moderate (HCC) 08/27/2016  . Well woman exam with routine gynecological exam 06/15/2016  . COPD (chronic obstructive pulmonary disease) with acute bronchitis (HCC) 08/23/2015  . Acute respiratory failure with hypoxia (HCC) 08/23/2015  . Sigmoid diverticulitis 08/03/2015  . Hypokalemia 08/03/2015  . Normocytic anemia 08/03/2015  . COPD with asthma (HCC)   . Thyroid activity decreased   . Bipolar disorder in partial remission (HCC)   .  Bipolar disorder (HCC) 02/14/2015  . Midline thoracic back pain 08/21/2014  . Abnormality of gait 08/21/2014  . Urinary urgency 08/21/2014  . ASTHMA 03/27/2008  . COLLES' FRACTURE, LEFT WRIST 03/27/2008    Past Surgical History:  Procedure Laterality Date  . ABDOMINAL HYSTERECTOMY    . broke left arm    . CESAREAN SECTION    . COLONOSCOPY N/A 08/30/2014   Procedure: COLONOSCOPY;  Surgeon: Malissa Hippo, MD;  Location: [facility];  Service: Endoscopy;  Laterality: N/A;  1200  . ELBOW SURGERY    . FOOT SURGERY    . KNEE SURGERY    . TONSILLECTOMY      Allergies Aminophylline; Sumatriptan; Theophylline; Codeine; Depakote [divalproex sodium]; Divalproex sodium; Doxycycline; Lamictal [lamotrigine]; Lithium; Magnesium-containing compounds; Metronidazole; Nabumetone; Naproxen; Other; Penicillins; Pentazocine lactate; Risperidone; Septra ds [sulfamethoxazole-trimethoprim]; Seroquel [quetiapine]; Tegretol [carbamazepine]; Clonidine derivatives; Tetracycline; and Zyprexa [olanzapine]  Family History  Problem Relation Age of Onset  . Dementia Mother   . Bipolar disorder Mother   . Dementia Father   . Other Son        MVA accident    Social History Social History   Tobacco Use  . Smoking status: Former Smoker    Packs/day: 4.00    Types: Cigarettes    Last attempt to quit: 07/06/2008    Years since quitting: 9.8  . Smokeless tobacco: Never Used  Substance Use Topics  . Alcohol use: No  . Drug use: No  Review of Systems  Constitutional: No fever/chills Eyes: Positive blurry vision and intermittent left eye vision loss.  ENT: No sore throat. Cardiovascular: Denies chest pain. Respiratory: Denies shortness of breath. Gastrointestinal: No abdominal pain. Positive nausea, no vomiting.  No diarrhea.  No constipation. Genitourinary: Negative for dysuria. Musculoskeletal: Negative for back pain. Positive bilateral hand cramping (intermittent).  Skin: Negative for  rash. Neurological: Negative for focal weakness or numbness. Positive HA.   10-point ROS otherwise negative.  ____________________________________________   PHYSICAL EXAM:  VITAL SIGNS: ED Triage Vitals  Enc Vitals Group     BP 05/07/18 1457 (!) 165/86     Pulse Rate 05/07/18 1457 91     Resp 05/07/18 1457 16     Temp 05/07/18 1457 98.8 F (37.1 C)     Temp Source 05/07/18 1457 Oral     SpO2 05/07/18 1457 97 %     Weight 05/07/18 1456 128 lb (58.1 kg)     Height 05/07/18 1456 5\' 8"  (1.727 m)     Pain Score 05/07/18 1458 10   Constitutional: Alert and oriented. Well appearing and in no acute distress. Patient has on sunglasses and is sitting in a dark room.  Eyes: Conjunctivae are normal. Positive photophobia.  Head: Atraumatic. Nose: No congestion/rhinnorhea. Mouth/Throat: Mucous membranes are moist.  Oropharynx non-erythematous. Neck: No stridor.  No meningeal signs.  Cardiovascular: Normal rate, regular rhythm. Good peripheral circulation. Grossly normal heart sounds.   Respiratory: Normal respiratory effort.  No retractions. Lungs CTAB. Gastrointestinal: Soft and nontender. No distention.  Musculoskeletal: No lower extremity tenderness nor edema. No gross deformities of extremities. Neurologic:  Normal speech and language. No gross focal neurologic deficits are appreciated.  Skin:  Skin is warm, dry and intact. No rash noted.  ____________________________________________   LABS (all labs ordered are listed, but only abnormal results are displayed)  Labs Reviewed  BASIC METABOLIC PANEL - Abnormal; Notable for the following components:      Result Value   Glucose, Bld 121 (*)    All other components within normal limits   ____________________________________________   PROCEDURES  Procedure(s) performed:   Procedures  None ____________________________________________   INITIAL IMPRESSION / ASSESSMENT AND PLAN / ED COURSE  Pertinent labs & imaging results  that were available during my care of the patient were reviewed by me and considered in my medical decision making (see chart for details).  Patient presents to the emergency department with persistent headache for the past 29 days.  She states that the quality and associated features are typical of her migraine headaches.  She does not follow with a neurologist.  No head trauma or concern for infectious etiology.  I did consider CT imaging of the head given her prolonged symptoms but review of the chart shows that her PCP ordered a CT head on 10/21 which was normal.  She is not experiencing sinus symptoms.  He is also complaining of some bilateral hand cramping.  She has no weakness in the hands.  No numbness.  I will obtain a basic metabolic panel to assess her kidney function and potassium and the cramping pain.  Patient with multiple medication allergies.  Plan for migraine cocktail IM and reassess.   Labs reviewed with no acute findings. Patient with continued HA. Given list of allergies the interventions in the ED are limited. Will add Compazine and reassess.   5:19 PM Patient with a mild improvement in headache with Compazine.  Given her allergy profile there is little else  that I can offer her in the emergency department.  Patient is okay with going home and will take some Benadryl to try and get some rest.  I did provide contact information for outpatient neurology and encouraged her to call on Monday for an appointment.  She states she is experiencing some mild dyspnea that is similar to her COPD type symptoms and is requesting an inhaler.  She ran out of this medication recently at home.  Inhaler provided in the emergency department along with prescription for albuterol and Compazine for home use.   At this time, I do not feel there is any life-threatening condition present. I have reviewed and discussed all results (EKG, imaging, lab, urine as appropriate), exam findings with patient. I have  reviewed nursing notes and appropriate previous records.  I feel the patient is safe to be discharged home without further emergent workup. Discussed usual and customary return precautions. Patient and family (if present) verbalize understanding and are comfortable with this plan.  Patient will follow-up with their primary care provider. If they do not have a primary care provider, information for follow-up has been provided to them. All questions have been answered.  ____________________________________________  FINAL CLINICAL IMPRESSION(S) / ED DIAGNOSES  Final diagnoses:  Migraine without status migrainosus, not intractable, unspecified migraine type     MEDICATIONS GIVEN DURING THIS VISIT:  Medications  albuterol (PROVENTIL HFA;VENTOLIN HFA) 108 (90 Base) MCG/ACT inhaler 2 puff (has no administration in time range)  ketorolac (TORADOL) 30 MG/ML injection 30 mg (30 mg Intramuscular Given 05/07/18 1602)  metoCLOPramide (REGLAN) injection 10 mg (10 mg Intramuscular Given 05/07/18 1601)  dexamethasone (DECADRON) injection 10 mg (10 mg Intramuscular Given 05/07/18 1602)  diphenhydrAMINE (BENADRYL) capsule 25 mg (25 mg Oral Given 05/07/18 1601)  prochlorperazine (COMPAZINE) injection 10 mg (10 mg Intramuscular Given 05/07/18 1704)     NEW OUTPATIENT MEDICATIONS STARTED DURING THIS VISIT:  New Prescriptions   ALBUTEROL (PROVENTIL HFA;VENTOLIN HFA) 108 (90 BASE) MCG/ACT INHALER    Inhale 1-2 puffs into the lungs every 6 (six) hours as needed for wheezing or shortness of breath.   PROCHLORPERAZINE (COMPAZINE) 10 MG TABLET    Take 1 tablet (10 mg total) by mouth 2 (two) times daily as needed for nausea or vomiting (or migraine headache symptoms).    Note:  This document was prepared using Dragon voice recognition software and may include unintentional dictation errors.  Alona Bene, MD Emergency Medicine    Elisheva Fallas, Arlyss Repress, MD 05/07/18 [phone]

## 2018-05-07 NOTE — Discharge Instructions (Signed)
You have been seen in the Emergency Department (ED) for a migraine.  Please use Tylenol or Motrin as needed for symptoms, but only as written on the box, and take any regular medications that have been prescribed for you. °As we have discussed, please follow up with your doctor as soon as possible regarding today’s ED visit and your headache symptoms.   ° °Call your doctor or return to the Emergency Department (ED) if you have a worsening headache, sudden and severe headache, confusion, slurred speech, facial droop, weakness or numbness in any arm or leg, extreme fatigue, or other symptoms that concern you. ° ° °Migraine Headache °A migraine headache is an intense, throbbing pain on one or both sides of your head. A migraine can last for 30 minutes to several hours. °CAUSES  °The exact cause of a migraine headache is not always known. However, a migraine may be caused when nerves in the brain become irritated and release chemicals that cause inflammation. This causes pain. °Certain things may also trigger migraines, such as: °· Alcohol. °· Smoking. °· Stress. °· Menstruation. °· Aged cheeses. °· Foods or drinks that contain nitrates, glutamate, aspartame, or tyramine. °· Lack of sleep. °· Chocolate. °· Caffeine. °· Hunger. °· Physical exertion. °· Fatigue. °· Medicines used to treat chest pain (nitroglycerine), birth control pills, estrogen, and some blood pressure medicines. °SIGNS AND SYMPTOMS °· Pain on one or both sides of your head. °· Pulsating or throbbing pain. °· Severe pain that prevents daily activities. °· Pain that is aggravated by any physical activity. °· Nausea, vomiting, or both. °· Dizziness. °· Pain with exposure to bright lights, loud noises, or activity. °· General sensitivity to bright lights, loud noises, or smells. °Before you get a migraine, you may get warning signs that a migraine is coming (aura). An aura may include: °· Seeing flashing lights. °· Seeing bright spots, halos, or zigzag  lines. °· Having tunnel vision or blurred vision. °· Having feelings of numbness or tingling. °· Having trouble talking. °· Having muscle weakness. °DIAGNOSIS  °A migraine headache is often diagnosed based on: °· Symptoms. °· Physical exam. °· A CT scan or MRI of your head. These imaging tests cannot diagnose migraines, but they can help rule out other causes of headaches. °TREATMENT °Medicines may be given for pain and nausea. Medicines can also be given to help prevent recurrent migraines.  °HOME CARE INSTRUCTIONS °· Only take over-the-counter or prescription medicines for pain or discomfort as directed by your health care provider. The use of Ilona Colley-term narcotics is not recommended. °· Lie down in a dark, quiet room when you have a migraine. °· Keep a journal to find out what may trigger your migraine headaches. For example, write down: °¨ What you eat and drink. °¨ How much sleep you get. °¨ Any change to your diet or medicines. °· Limit alcohol consumption. °· Quit smoking if you smoke. °· Get 7-9 hours of sleep, or as recommended by your health care provider. °· Limit stress. °· Keep lights dim if bright lights bother you and make your migraines worse. °SEEK IMMEDIATE MEDICAL CARE IF:  °· Your migraine becomes severe. °· You have a fever. °· You have a stiff neck. °· You have vision loss. °· You have muscular weakness or loss of muscle control. °· You start losing your balance or have trouble walking. °· You feel faint or pass out. °· You have severe symptoms that are different from your first symptoms. °MAKE SURE YOU:  °·   Understand these instructions. °· Will watch your condition. °· Will get help right away if you are not doing well or get worse. °  °This information is not intended to replace advice given to you by your health care provider. Make sure you discuss any questions you have with your health care provider. °  °Document Released: 06/22/2005 Document Revised: 07/13/2014 Document Reviewed:  02/27/2013 °Elsevier Interactive Patient Education ©2016 [employer] ° ° ° °

## 2018-05-07 NOTE — ED Notes (Signed)
Son reports he went to house and brought inhaler

## 2018-05-07 NOTE — ED Triage Notes (Signed)
Patient c/o migraine headache that started 29 days ago and is progressively getting worse. Per patient hx of migraine headache. Per patient nausea, vomiting, and blurred vision-with intermittent vision loss in left eye. Patient taking Excedrin migraine yesterday with no relief. Per patient photosensitivity.

## 2018-05-16 ENCOUNTER — Ambulatory Visit (HOSPITAL_COMMUNITY): Payer: Self-pay | Admitting: Psychiatry

## 2018-05-16 ENCOUNTER — Ambulatory Visit (HOSPITAL_COMMUNITY): Payer: Medicare Other | Admitting: Psychiatry

## 2018-05-18 ENCOUNTER — Other Ambulatory Visit (HOSPITAL_COMMUNITY): Payer: Self-pay | Admitting: Psychiatry

## 2018-05-18 MED ORDER — ZIPRASIDONE HCL 60 MG PO CAPS: 60.0000 mg | ORAL_CAPSULE | Freq: Two times a day (BID) | ORAL | 0 refills | Status: DC

## 2018-05-20 NOTE — Progress Notes (Deleted)
[facility] MD/PA/NP OP Progress Note  05/20/2018 9:47 AM Kristen Ryan  MRN:  [id number]  Chief Complaint:  HPI: *** Visit Diagnosis: No diagnosis found.  Past Psychiatric History: Please see initial evaluation for full details. I have reviewed the history. No updates at this time.     Past Medical History:  Past Medical History:  Diagnosis Date  . Anemia   . Asthma   . Bipolar 1 disorder (HCC)   . Bronchitis   . COPD (chronic obstructive pulmonary disease) (HCC)   . Diverticulitis   . IBS (irritable bowel syndrome)   . Migraine   . Neuropathy   . Schizophrenia (HCC)   . Thyroid disease     Past Surgical History:  Procedure Laterality Date  . ABDOMINAL HYSTERECTOMY    . broke left arm    . CESAREAN SECTION    . COLONOSCOPY N/A 08/30/2014   Procedure: COLONOSCOPY;  Surgeon: Malissa Hippo, MD;  Location: [facility];  Service: Endoscopy;  Laterality: N/A;  1200  . ELBOW SURGERY    . FOOT SURGERY    . KNEE SURGERY    . TONSILLECTOMY      Family Psychiatric History: Please see initial evaluation for full details. I have reviewed the history. No updates at this time.     Family History:  Family History  Problem Relation Age of Onset  . Dementia Mother   . Bipolar disorder Mother   . Dementia Father   . Other Son        MVA accident    Social History:  Social History   Socioeconomic History  . Marital status: Married    Spouse name: Not on file  . Number of children: Not on file  . Years of education: Not on file  . Highest education level: Not on file  Occupational History  . Not on file  Social Needs  . Financial resource strain: Not on file  . Food insecurity:    Worry: Not on file    Inability: Not on file  . Transportation needs:    Medical: Not on file    Non-medical: Not on file  Tobacco Use  . Smoking status: Former Smoker    Packs/day: 4.00    Types: Cigarettes    Last attempt to quit: 07/06/2008    Years since quitting: 9.8  . Smokeless  tobacco: Never Used  Substance and Sexual Activity  . Alcohol use: No  . Drug use: No  . Sexual activity: Yes    Birth control/protection: Surgical    Comment: hyst  Lifestyle  . Physical activity:    Days per week: Not on file    Minutes per session: Not on file  . Stress: Not on file  Relationships  . Social connections:    Talks on phone: Not on file    Gets together: Not on file    Attends religious service: Not on file    Active member of club or organization: Not on file    Attends meetings of clubs or organizations: Not on file    Relationship status: Not on file  Other Topics Concern  . Not on file  Social History Narrative  . Not on file    Allergies:  Allergies  Allergen Reactions  . Aminophylline Anaphylaxis  . Sumatriptan Anaphylaxis and Other (See Comments)    Causes fainting  . Theophylline Anaphylaxis  . Codeine Itching and Other (See Comments)    Too strong for patient   .  Depakote [Divalproex Sodium] Other (See Comments)    Hair loss  . Divalproex Sodium Other (See Comments)    Causes hair to fall out  . Doxycycline Other (See Comments)    headache  . Lamictal [Lamotrigine]     Destroys thyroid  . Lithium Other (See Comments)    Adverse reaction to Thyroid   . Magnesium-Containing Compounds Hives  . Metronidazole   . Nabumetone Swelling  . Naproxen Swelling  . Other     Hair dye  . Penicillins Nausea And Vomiting    Has patient had a PCN reaction causing immediate rash, facial/tongue/throat swelling, SOB or lightheadedness with hypotension: Yes Has patient had a PCN reaction causing severe rash involving mucus membranes or skin necrosis: No Has patient had a PCN reaction that required hospitalization Yes Has patient had a PCN reaction occurring within the last 10 years: No If all of the above answers are "NO", then may proceed with Cephalosporin use.   Marland Kitchen. Pentazocine Lactate Other (See Comments)    Unknown reaction  . Risperidone Other (See  Comments)    Insomnia   . Septra Ds [Sulfamethoxazole-Trimethoprim]   . Seroquel [Quetiapine] Swelling  . Tegretol [Carbamazepine] Swelling  . Clonidine Derivatives     Dizziness at 0.1 mg  . Tetracycline Rash    Headaches  . Zyprexa [Olanzapine] Rash and Other (See Comments)    Insomnia    Metabolic Disorder Labs: No results found for: HGBA1C, MPG No results found for: PROLACTIN No results found for: CHOL, TRIG, HDL, CHOLHDL, VLDL, LDLCALC Lab Results  Component Value Date   TSH 0.888 07/07/2010    Therapeutic Level Labs: Lab Results  Component Value Date   LITHIUM 1.27 10/25/2013   No results found for: VALPROATE No components found for:  CBMZ  Current Medications: Current Outpatient Medications  Medication Sig Dispense Refill  . albuterol (PROVENTIL HFA;VENTOLIN HFA) 108 (90 Base) MCG/ACT inhaler Inhale 1-2 puffs into the lungs every 6 (six) hours as needed for wheezing or shortness of breath. 1 Inhaler 0  . aspirin 325 MG tablet Take 325 mg by mouth daily.    . benztropine (COGENTIN) 0.5 MG tablet Take 1 tablet (0.5 mg total) by mouth 2 (two) times daily. 180 tablet 0  . budesonide-formoterol (SYMBICORT) 160-4.5 MCG/ACT inhaler Inhale 2 puffs into the lungs 2 (two) times daily. 1 Inhaler 1  . Calcium Carbonate (CALCIUM 600 PO) Take 1 tablet by mouth 2 (two) times daily.    . cetirizine (ZYRTEC) 10 MG tablet Take 10 mg by mouth daily.    . ciprofloxacin (CIPRO) 500 MG tablet Take 1 tablet (500 mg total) by mouth 2 (two) times daily. 14 tablet 0  . dicyclomine (BENTYL) 10 MG capsule TAKE 1 CAPSULE BY MOUTH THREE TIMES DAILY AS NEEDED FOR SPASMS. 90 capsule 5  . gabapentin (NEURONTIN) 300 MG capsule Take 300 mg by mouth 2 (two) times daily.    Marland Kitchen. levETIRAcetam (KEPPRA) 750 MG tablet Take 1 tablet by mouth at bedtime.    Marland Kitchen. levothyroxine (SYNTHROID, LEVOTHROID) 50 MCG tablet Take 50 mcg by mouth daily.    . Multiple Vitamin (MULTIVITAMIN WITH MINERALS) TABS tablet Take 1  tablet by mouth daily.    . naproxen (NAPROSYN) 500 MG tablet Take 500 mg by mouth 2 (two) times daily with a meal.    . oxyCODONE-acetaminophen (PERCOCET/ROXICET) 5-325 MG tablet Take 1 tablet by mouth 3 (three) times daily.    Marland Kitchen. PARoxetine (PAXIL) 20 MG tablet Take 20 mg  by mouth daily.    . prochlorperazine (COMPAZINE) 10 MG tablet Take 1 tablet (10 mg total) by mouth 2 (two) times daily as needed for nausea or vomiting (or migraine headache symptoms). 10 tablet 0  . Red Yeast Rice 600 MG CAPS Take 1 capsule by mouth 2 (two) times daily.    . Selenium 200 MCG TABS Take by mouth daily.    . sodium chloride (OCEAN) 0.65 % SOLN nasal spray Place 1 spray into both nostrils as needed for congestion.    . traZODone (DESYREL) 100 MG tablet Take 1 tablet (100 mg total) by mouth at bedtime as needed for sleep. 90 tablet 0  . [START ON 05/25/2018] ziprasidone (GEODON) 60 MG capsule Take 1 capsule (60 mg total) by mouth 2 (two) times daily. 180 capsule 0   No current facility-administered medications for this visit.      Musculoskeletal: Strength & Muscle Tone: within normal limits Gait & Station: normal Patient leans: N/A  Psychiatric Specialty Exam: ROS  There were no vitals taken for this visit.There is no height or weight on file to calculate BMI.  General Appearance: Fairly Groomed  Eye Contact:  Good  Speech:  Clear and Coherent  Volume:  Normal  Mood:  {BHH MOOD:22306}  Affect:  {Affect (PAA):22687}  Thought Process:  Coherent  Orientation:  Full (Time, Place, and Person)  Thought Content: Logical   Suicidal Thoughts:  {ST/HT (PAA):22692}  Homicidal Thoughts:  {ST/HT (PAA):22692}  Memory:  Immediate;   Good  Judgement:  {Judgement (PAA):22694}  Insight:  {Insight (PAA):22695}  Psychomotor Activity:  Normal  Concentration:  Concentration: Good and Attention Span: Good  Recall:  Good  Fund of Knowledge: Good  Language: Good  Akathisia:  No  Handed:  Right  AIMS (if  indicated): not done  Assets:  Communication Skills Desire for Improvement  ADL's:  Intact  Cognition: WNL  Sleep:  {BHH GOOD/FAIR/POOR:22877}   Screenings: PHQ2-9     Office Visit from 07/28/2016 in [facility] Office Visit from 06/15/2016 in [facility]  PHQ-2 Total Score  6  6  PHQ-9 Total Score  14  14       Assessment and Plan:  Kristen Ryan is a 62 y.o. year old female with a history of bipolar I disorder, PTSD, COPD,IBS, neuropathy , who presents for follow up appointment for No diagnosis found.  # Bipolar I disorder, mixed # PTSD # r/o dissociative identity disorder Exam is notable for significant improvement in neurovegetative symptoms and anxiety, which coincided with started on Paxil by her PCP.  Will continue Paxil at the current dose to target depression and anxiety, PTSD.  Will continue Geodon for mood dysregulation.  Discussed risk of EPS.  Will taper off Cogentin to avoid polypharmacy.  Will continue trazodone as needed for insomnia.   Plan 1. Continue Paxil 20 mg daily- prescribed by PCP 1. Continue ziprasidone 60 mg twice a day(QTc 450 msec on 03/16/2017) 2. Decrease benztropine 0.5 mg daily for one week, then discontinue 3. Continue Trazodone 100 mg at night as needed for sleep 4.Return to clinicinthree month for 15 mins - TSH checked by her PCP, Dr. Janna Arch - on gabapentin 300 mg BID  Past trials of medication: sertraline (weight gain), fluoxetine (SI), Lexapro, Effexor (headache), duloxetine (rash, headache), mirtazapine (rash), Paxil, Depakote, lithium (thyroid issues), carbamazepine (swelling), lamotrigine (swelling, headache, crying spells), quetiapine (swelling of throat,rash per patient), Geodon, Xanax, clonazepam, clonidine (dizziness), hydroxyzine (limited benefit)  The patient  demonstrates the following risk factors for suicide: Chronic risk factors for suicide include: psychiatric disorder of bipolar disorderand chronic  pain. Acute risk factorsfor suicide include: loss (financial, interpersonal, professional). Protective factorsfor this patient include: positive social support, coping skills and hope for the future. Considering these factors, the overall suicide risk at this point appears to be low. Patient isappropriate for outpatient follow up.   Neysa Hotter, MD 05/20/2018, 9:47 AM

## 2018-05-23 ENCOUNTER — Ambulatory Visit (HOSPITAL_COMMUNITY)
Admission: RE | Admit: 2018-05-23 | Discharge: 2018-05-23 | Disposition: A | Discharge status: Home / Self Care | Payer: Medicare Other | Source: Ambulatory Visit | Attending: Family Medicine | Admitting: Family Medicine

## 2018-05-23 ENCOUNTER — Other Ambulatory Visit (HOSPITAL_COMMUNITY): Payer: Self-pay | Admitting: Family Medicine

## 2018-05-23 DIAGNOSIS — M25562 Pain in left knee: Principal | ICD-10-CM

## 2018-05-23 DIAGNOSIS — M25561 Pain in right knee: Secondary | ICD-10-CM | POA: Insufficient documentation

## 2018-05-23 DIAGNOSIS — G8929 Other chronic pain: Secondary | ICD-10-CM | POA: Insufficient documentation

## 2018-05-26 ENCOUNTER — Ambulatory Visit (HOSPITAL_COMMUNITY): Payer: Self-pay | Admitting: Psychiatry

## 2018-05-30 ENCOUNTER — Ambulatory Visit (HOSPITAL_COMMUNITY): Payer: Medicare Other | Admitting: Psychiatry

## 2018-05-30 ENCOUNTER — Ambulatory Visit (INDEPENDENT_AMBULATORY_CARE_PROVIDER_SITE_OTHER): Payer: Medicare Other | Admitting: Psychiatry

## 2018-05-30 DIAGNOSIS — F4310 Post-traumatic stress disorder, unspecified: Secondary | ICD-10-CM | POA: Diagnosis not present

## 2018-05-30 DIAGNOSIS — F3162 Bipolar disorder, current episode mixed, moderate: Secondary | ICD-10-CM
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
## 2018-06-06 ENCOUNTER — Other Ambulatory Visit: Payer: Self-pay | Admitting: Adult Health

## 2018-06-07 ENCOUNTER — Ambulatory Visit: Payer: Self-pay | Admitting: Adult Health

## 2018-06-13 ENCOUNTER — Ambulatory Visit (HOSPITAL_COMMUNITY)
Admission: RE | Admit: 2018-06-13 | Discharge: 2018-06-13 | Disposition: A | Discharge status: Home / Self Care | Payer: Medicare Other | Source: Ambulatory Visit | Attending: Family Medicine | Admitting: Family Medicine

## 2018-06-13 ENCOUNTER — Other Ambulatory Visit (HOSPITAL_COMMUNITY): Payer: Self-pay | Admitting: Family Medicine

## 2018-06-13 DIAGNOSIS — M25572 Pain in left ankle and joints of left foot: Secondary | ICD-10-CM | POA: Diagnosis present
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
## 2018-06-14 ENCOUNTER — Encounter (INDEPENDENT_AMBULATORY_CARE_PROVIDER_SITE_OTHER): Payer: Self-pay

## 2018-06-14 ENCOUNTER — Other Ambulatory Visit: Payer: Self-pay

## 2018-06-14 ENCOUNTER — Encounter: Payer: Self-pay | Admitting: Adult Health

## 2018-06-14 ENCOUNTER — Ambulatory Visit (INDEPENDENT_AMBULATORY_CARE_PROVIDER_SITE_OTHER): Payer: Medicare Other | Admitting: Adult Health

## 2018-06-14 VITALS — BP 155/86 | HR 86 | Ht 67.0 in | Wt 135.0 lb

## 2018-06-14 DIAGNOSIS — R238 Other skin changes: Secondary | ICD-10-CM

## 2018-06-14 DIAGNOSIS — N644 Mastodynia: Secondary | ICD-10-CM | POA: Diagnosis not present

## 2018-06-14 MED ORDER — FLUTICASONE PROPIONATE 0.05 % EX CREA: TOPICAL_CREAM | Freq: Two times a day (BID) | CUTANEOUS | 1 refills | Status: DC

## 2018-06-14 MED ORDER — KETOCONAZOLE 2 % EX CREA: 1.0000 "application " | TOPICAL_CREAM | Freq: Two times a day (BID) | CUTANEOUS | 1 refills | Status: DC

## 2018-06-14 NOTE — Progress Notes (Signed)
  Subjective:     Patient ID: Kristen Ryan, female   DOB: 03/18/1956, 62 y.o.   MRN: [id number]  HPI Kristen Ryan is a 62 year old white female, married, sp hysterectomy ion complaining of breast tenderness and they feel lumpy, esp if cat walks across them. PCP is Dr Janna ArchonDiego.   Review of Systems +breast tenderness +lumpy feeling breasts Skin irritation  Still has vaginal dryness  Reviewed past medical,surgical, social and family history. Reviewed medications and allergies.     Objective:   Physical Exam BP (!) 155/86 (BP Location: Right Arm, Patient Position: Sitting, Cuff Size: Normal)   Pulse 86   Ht 5\' 7"  (1.702 m)   Wt 135 lb (61.2 kg)   BMI 21.14 kg/m   Fall risk is moderate.  Skin warm and dry,  Breasts:no dominate palpable mass, retraction or nipple discharge, breasts are tender bilaterally and feel dense, will get diagnostic mammogram. Skin around mouth and on forehead, red and scaly, [facility] and [facility].  She has brace left ankle and walking with cane today.     Assessment:     1. Breast tenderness in female   2. Skin irritation       Plan:     [facility] and [facility] ordered this encounter  Medications  . fluticasone (CUTIVATE) 0.05 % cream    Sig: Apply topically 2 (two) times daily. Mix with ketoconazole    Dispense:  30 g    Refill:  1    Order Specific Question:   Supervising Provider    Answer:   EURE, LUTHER H [[id number]]  . ketoconazole (NIZORAL) 2 % cream    Sig: Apply 1 application topically 2 (two) times daily. Mix with fluticasone    Dispense:  30 g    Refill:  1    Order Specific Question:   Supervising Provider    Answer:   Duane LopeEURE, LUTHER H [[id number]]  F/U prn

## 2018-06-20 ENCOUNTER — Encounter (HOSPITAL_COMMUNITY): Payer: Self-pay | Admitting: Psychiatry

## 2018-06-20 ENCOUNTER — Ambulatory Visit (INDEPENDENT_AMBULATORY_CARE_PROVIDER_SITE_OTHER): Payer: Medicare Other | Admitting: Psychiatry

## 2018-06-20 VITALS — BP 160/76 | HR 71 | Ht 67.0 in | Wt 134.8 lb

## 2018-06-20 DIAGNOSIS — F319 Bipolar disorder, unspecified: Secondary | ICD-10-CM | POA: Diagnosis not present

## 2018-06-20 DIAGNOSIS — F4310 Post-traumatic stress disorder, unspecified: Secondary | ICD-10-CM | POA: Diagnosis not present

## 2018-06-20 MED ORDER — PAROXETINE HCL 30 MG PO TABS: 30.0000 mg | ORAL_TABLET | Freq: Every day | ORAL | 0 refills | Status: DC

## 2018-06-20 MED ORDER — TRAZODONE HCL 100 MG PO TABS: 100.0000 mg | ORAL_TABLET | Freq: Every evening | ORAL | 0 refills | Status: DC | PRN

## 2018-06-20 MED ORDER — ZIPRASIDONE HCL 60 MG PO CAPS: 60.0000 mg | ORAL_CAPSULE | Freq: Two times a day (BID) | ORAL | 0 refills | Status: DC

## 2018-06-20 NOTE — Patient Instructions (Addendum)
1. Increase paxil 30 mg daily 2. Continue ziprasidone 60 mg twice a day 3. Continue Trazodone 100 mg at night as needed for sleep 4.Return to clinicinthree month for 15 mins

## 2018-07-05 ENCOUNTER — Ambulatory Visit (HOSPITAL_COMMUNITY)
Admission: RE | Admit: 2018-07-05 | Discharge: 2018-07-05 | Disposition: A | Discharge status: Home / Self Care | Payer: Medicare Other | Source: Ambulatory Visit | Attending: Adult Health | Admitting: Adult Health

## 2018-07-05 ENCOUNTER — Ambulatory Visit (HOSPITAL_COMMUNITY): Payer: Medicare Other

## 2018-07-05 DIAGNOSIS — N644 Mastodynia: Secondary | ICD-10-CM | POA: Diagnosis not present

## 2018-07-07 ENCOUNTER — Ambulatory Visit (HOSPITAL_COMMUNITY): Payer: Medicare Other | Admitting: Psychiatry

## 2018-07-28 ENCOUNTER — Ambulatory Visit (HOSPITAL_COMMUNITY): Payer: Medicare Other | Admitting: Psychiatry

## 2018-08-04 ENCOUNTER — Other Ambulatory Visit (INDEPENDENT_AMBULATORY_CARE_PROVIDER_SITE_OTHER): Payer: Self-pay | Admitting: *Deleted

## 2018-08-04 MED ORDER — DICYCLOMINE HCL 10 MG PO CAPS: ORAL_CAPSULE | ORAL | 2 refills | Status: DC

## 2018-08-09 ENCOUNTER — Ambulatory Visit (HOSPITAL_COMMUNITY): Payer: Medicare Other | Admitting: Psychiatry

## 2018-08-11 ENCOUNTER — Ambulatory Visit (HOSPITAL_COMMUNITY): Payer: Medicare Other | Admitting: Psychiatry

## 2018-08-17 ENCOUNTER — Telehealth: Payer: Self-pay | Admitting: Orthopedic Surgery

## 2018-08-17 NOTE — Telephone Encounter (Signed)
Call received from patient - relays that she had a fall last night - said fell over her dog.  She requests to see Dr Romeo Apple specifically, and asked for appointment today; said she has not had any treatment as of yet. Relayed we do not have a same day appiontment available for Dr Romeo Apple, and discussed options of primary care, or urgent care or emergency room. York Spaniel will try primary care, and wants to try to avoid hospital due to flu season. Will call back if needs to schedule appointment.

## 2018-08-24 ENCOUNTER — Ambulatory Visit (INDEPENDENT_AMBULATORY_CARE_PROVIDER_SITE_OTHER): Payer: Medicare Other | Admitting: Psychiatry

## 2018-08-24 DIAGNOSIS — F4310 Post-traumatic stress disorder, unspecified: Secondary | ICD-10-CM

## 2018-08-24 DIAGNOSIS — F319 Bipolar disorder, unspecified: Secondary | ICD-10-CM | POA: Diagnosis not present

## 2018-08-24 NOTE — Progress Notes (Signed)
   THERAPIST PROGRESS NOTE  Session Time: Wednesday 08/24/2018 2:10 PM - 2:55 PM  Participation Level: Active  Behavioral Response: Well GroomedAlertEuthymic  Type of Therapy: Individual Therapy  Treatment Goals addressed: learn and implement relapse prevention strategies  Interventions: CBT  Summary: Kristen Ryan is a 63 y.o. female who is referred for services by psychiatrist Dr. Vanetta Shawl. Patient reports a long-standing history of symptoms of bipolar disorder and schizophrenia.  Patient last was seen in November 2019. She reports missing appointments due to various health issues including having pneumonia and chronic bronchitis. She also reports injuring self in a couple of falls. Per her report, the holidays were difficult as this was the first Thanksgiving and Christmas without her father. However, she reports strong support from her husband and her best friend. She says she is doing very well now as her best friend of 38 years moved in with patient and her husband last week. She also reports decreased stress regarding son and stepson as they no longer reside with patient and her husband. Patient reports she and husband moved into their new home 06/22/2018 and reports enjoying her home very much. She continues to miss her father but says she is coping well.  Suicidal/Homicidal: Nowithout intent/plan  Therapist Response: reviewed symptoms, discussed stressors, facilitated expression of thoughts and feelings, validate feelings, discussed patient's progress, discussed lapse versus relapse,assisted patient identify early warning signs of depression and ways to intervene to avoid relapse, agreed with patient to discontinue treatment at this time as patient says she is doing well and has strong support system  Plan: Patient and therapist agreed to discontinue treatment at this time as patient says she is doing well and has strong support system. She is encouraged to call this practice should  she need psychotherapy services in the future. She will continue to see psychiatrist Dr. Vanetta Shawl for medication management.  Diagnosis: Axis I: PTSD, Bipolar Disorder    Axis II: Deferred    Kristen Salvage, LCSW 08/24/2018  Outpatient Therapist Discharge Summary  Kristen Ryan    11-Apr-1956   Admission Date:   10/27/2016 Discharge Date:  08/24/2018 Reason for Discharge: Therapist agreed with patient to discontinue treatment at this time as patient says she is doing well and had strong support system.  Diagnosis:  Axis I:  PTSD, Bipolar Disorder   Comments:  Patient  is encouraged to call this practice should she need psychotherapy services in the future. She will continue to see psychiatrist Dr. Vanetta Shawl for medication management.   Roby Donaway E Ashiyah Pavlak LCSW

## 2018-08-29 ENCOUNTER — Ambulatory Visit (HOSPITAL_COMMUNITY)
Admission: RE | Admit: 2018-08-29 | Discharge: 2018-08-29 | Disposition: A | Discharge status: Home / Self Care | Payer: Medicare Other | Source: Ambulatory Visit | Attending: Family Medicine | Admitting: Family Medicine

## 2018-08-29 ENCOUNTER — Other Ambulatory Visit (HOSPITAL_COMMUNITY): Payer: Self-pay | Admitting: Family Medicine

## 2018-08-29 DIAGNOSIS — M1990 Unspecified osteoarthritis, unspecified site: Secondary | ICD-10-CM

## 2018-08-29 DIAGNOSIS — M545 Low back pain, unspecified: Secondary | ICD-10-CM

## 2018-08-31 ENCOUNTER — Ambulatory Visit (INDEPENDENT_AMBULATORY_CARE_PROVIDER_SITE_OTHER): Payer: Self-pay | Admitting: Internal Medicine

## 2018-09-13 NOTE — Progress Notes (Signed)
[facility] MD/PA/NP OP Progress Note  09/19/2018 1:35 PM Kristen Ryan  MRN:  [id number]  Chief Complaint:  Chief Complaint    Follow-up; Other     HPI:  Patient presents for follow-up appointment for bipolar disorder and PTSD.  She states that there was anniversary of her father's in March.  She misses him more compared to before.  She wants to increase Paxil to see if that is helpful.  She also introduces her "sister," who is a best friend, who moved in to the patient house.  She states that they have great relationship, and her "sister" is almost like a therapist to the patient.  She sleeps 8 to 9 hours.  She feels depressed at times.  She has fair concentration.  She denies SI.  She feels anxious and tense at times.  She denies panic attacks.  She denies decreased need for sleep or euphoria.   Visit Diagnosis:    ICD-10-CM   1. PTSD (post-traumatic stress disorder) F43.10   2. Bipolar 1 disorder (HCC) F31.9     Past Psychiatric History: Please see initial evaluation for full details. I have reviewed the history. No updates at this time.     Past Medical History:  Past Medical History:  Diagnosis Date  . Anemia   . Asthma   . Bipolar 1 disorder (HCC)   . Bronchitis   . COPD (chronic obstructive pulmonary disease) (HCC)   . Diverticulitis   . IBS (irritable bowel syndrome)   . Migraine   . Neuropathy   . Schizophrenia (HCC)   . Thyroid disease     Past Surgical History:  Procedure Laterality Date  . ABDOMINAL HYSTERECTOMY    . broke left arm    . CESAREAN SECTION    . COLONOSCOPY N/A 08/30/2014   Procedure: COLONOSCOPY;  Surgeon: Malissa Hippo, MD;  [facility];  Service: Endoscopy;  Laterality: N/A;  1200  . ELBOW SURGERY    . FOOT SURGERY    . KNEE SURGERY    . TONSILLECTOMY      Family Psychiatric History: Please see initial evaluation for full details. I have reviewed the history. No updates at this time.     Family History:  Family History   Problem Relation Age of Onset  . Dementia Mother   . Bipolar disorder Mother   . Dementia Father   . Other Son        MVA accident    Social History:  Social History   Socioeconomic History  . Marital status: Married    Spouse name: Not on file  . Number of children: Not on file  . Years of education: Not on file  . Highest education level: Not on file  Occupational History  . Not on file  Social Needs  . Financial resource strain: Not on file  . Food insecurity:    Worry: Not on file    Inability: Not on file  . Transportation needs:    Medical: Not on file    Non-medical: Not on file  Tobacco Use  . Smoking status: Former Smoker    Packs/day: 4.00    Types: Cigarettes    Last attempt to quit: 07/06/2008    Years since quitting: 10.2  . Smokeless tobacco: Never Used  Substance and Sexual Activity  . Alcohol use: No  . Drug use: No  . Sexual activity: Yes    Birth control/protection: Surgical    Comment: hyst  Lifestyle  .  Physical activity:    Days per week: Not on file    Minutes per session: Not on file  . Stress: Not on file  Relationships  . Social connections:    Talks on phone: Not on file    Gets together: Not on file    Attends religious service: Not on file    Active member of club or organization: Not on file    Attends meetings of clubs or organizations: Not on file    Relationship status: Not on file  Other Topics Concern  . Not on file  Social History Narrative  . Not on file    Allergies:  Allergies  Allergen Reactions  . Aminophylline Anaphylaxis  . Sumatriptan Anaphylaxis and Other (See Comments)    Causes fainting  . Theophylline Anaphylaxis  . Codeine Itching and Other (See Comments)    Too strong for patient   . Depakote [Divalproex Sodium] Other (See Comments)    Hair loss  . Divalproex Sodium Other (See Comments)    Causes hair to fall out  . Doxycycline Other (See Comments)    headache  . Lamictal [Lamotrigine]      Destroys thyroid  . Lithium Other (See Comments)    Adverse reaction to Thyroid   . Magnesium-Containing Compounds Hives  . Metronidazole   . Nabumetone Swelling  . Naproxen Swelling  . Other     Hair dye  . Penicillins Nausea And Vomiting    Has patient had a PCN reaction causing immediate rash, facial/tongue/throat swelling, SOB or lightheadedness with hypotension: Yes Has patient had a PCN reaction causing severe rash involving mucus membranes or skin necrosis: No Has patient had a PCN reaction that required hospitalization Yes Has patient had a PCN reaction occurring within the last 10 years: No If all of the above answers are "NO", then may proceed with Cephalosporin use.   Marland Kitchen Pentazocine Lactate Other (See Comments)    Unknown reaction  . Risperidone Other (See Comments)    Insomnia   . Septra Ds [Sulfamethoxazole-Trimethoprim]   . Seroquel [Quetiapine] Swelling  . Tegretol [Carbamazepine] Swelling  . Clonidine Derivatives     Dizziness at 0.1 mg  . Tetracycline Rash    Headaches  . Zyprexa [Olanzapine] Rash and Other (See Comments)    Insomnia    Metabolic Disorder Labs: No results found for: HGBA1C, MPG No results found for: PROLACTIN No results found for: CHOL, TRIG, HDL, CHOLHDL, VLDL, LDLCALC Lab Results  Component Value Date   TSH 0.888 07/07/2010    Therapeutic Level Labs: Lab Results  Component Value Date   LITHIUM 1.27 10/25/2013   No results found for: VALPROATE No components found for:  CBMZ  Current Medications: Current Outpatient Medications  Medication Sig Dispense Refill  . albuterol (PROVENTIL HFA;VENTOLIN HFA) 108 (90 Base) MCG/ACT inhaler Inhale 1-2 puffs into the lungs every 6 (six) hours as needed for wheezing or shortness of breath. 1 Inhaler 0  . aspirin 325 MG tablet Take 325 mg by mouth daily.    . budesonide-formoterol (SYMBICORT) 160-4.5 MCG/ACT inhaler Inhale 2 puffs into the lungs 2 (two) times daily. 1 Inhaler 1  . Calcium  Carbonate (CALCIUM 600 PO) Take 1 tablet by mouth 2 (two) times daily.    Marland Kitchen dicyclomine (BENTYL) 10 MG capsule TAKE 1 CAPSULE BY MOUTH THREE TIMES DAILY AS NEEDED FOR SPASMS. 90 capsule 2  . fluticasone (CUTIVATE) 0.05 % cream Apply topically 2 (two) times daily. Mix with ketoconazole 30 g 1  .  gabapentin (NEURONTIN) 300 MG capsule Take 300 mg by mouth 2 (two) times daily.    Marland Kitchen ketoconazole (NIZORAL) 2 % cream Apply 1 application topically 2 (two) times daily. Mix with fluticasone 30 g 1  . levothyroxine (SYNTHROID, LEVOTHROID) 50 MCG tablet Take 50 mcg by mouth daily.    Marland Kitchen loratadine (CLARITIN) 10 MG tablet Take 10 mg by mouth daily.    . Multiple Vitamin (MULTIVITAMIN WITH MINERALS) TABS tablet Take 1 tablet by mouth daily.    . naproxen (NAPROSYN) 500 MG tablet Take 500 mg by mouth 2 (two) times daily with a meal.    . oxyCODONE-acetaminophen (PERCOCET/ROXICET) 5-325 MG tablet Take 1 tablet by mouth 3 (three) times daily.    Marland Kitchen PARoxetine (PAXIL) 40 MG tablet Take 1 tablet (40 mg total) by mouth daily. 90 tablet 0  . Red Yeast Rice 600 MG CAPS Take 1 capsule by mouth 2 (two) times daily.    . Selenium 200 MCG TABS Take by mouth daily.    . sodium chloride (OCEAN) 0.65 % SOLN nasal spray Place 1 spray into both nostrils as needed for congestion.    . ziprasidone (GEODON) 60 MG capsule Take 1 capsule (60 mg total) by mouth 2 (two) times daily. 180 capsule 0  . levETIRAcetam (KEPPRA) 750 MG tablet Take 1 tablet by mouth at bedtime.    . traZODone (DESYREL) 100 MG tablet Take 1 tablet (100 mg total) by mouth at bedtime as needed for sleep. (Patient not taking: Reported on 09/19/2018) 90 tablet 0  . TRELEGY ELLIPTA 100-62.5-25 MCG/INH AEPB      No current facility-administered medications for this visit.      Musculoskeletal: Strength & Muscle Tone: within normal limits Gait & Station: normal Patient leans: N/A  Psychiatric Specialty Exam: Review of Systems  Psychiatric/Behavioral: Positive  for depression. Negative for hallucinations, memory loss, substance abuse and suicidal ideas. The patient is nervous/anxious. The patient does not have insomnia.   All other systems reviewed and are negative.   Blood pressure 139/83, pulse 86, height 5\' 7"  (1.702 m), weight 150 lb (68 kg).Body mass index is 23.49 kg/m.  General Appearance: Fairly Groomed  Eye Contact:  Good  Speech:  Clear and Coherent  Volume:  Normal  Mood:  Anxious  Affect:  Appropriate, Congruent and euthymic, reactive, smiles  Thought Process:  Coherent  Orientation:  Full (Time, Place, and Person)  Thought Content: Logical   Suicidal Thoughts:  No  Homicidal Thoughts:  No  Memory:  Immediate;   Good  Judgement:  Good  Insight:  Fair  Psychomotor Activity:  Normal  Concentration:  Concentration: Good and Attention Span: Good  Recall:  Good  Fund of Knowledge: Good  Language: Good  Akathisia:  No  Handed:  Right  AIMS (if indicated): not done  Assets:  Communication Skills Desire for Improvement  ADL's:  Intact  Cognition: WNL  Sleep:  Good   Screenings: PHQ2-9     Office Visit from 07/28/2016 in [facility] Office Visit from 06/15/2016 in [facility]  PHQ-2 Total Score  6  6  PHQ-9 Total Score  14  14       Assessment and Plan:  CHABELY SENSABAUGH is a 63 y.o. year old female with a history of bipolar I disorder, PTSD, COPD, IBS, neuropathy , who presents for follow up appointment for PTSD (post-traumatic stress disorder)  Bipolar 1 disorder (HCC)  # Bipolar I disorder, mixed # PTSD # r/o  dissociative identity disorder Although exam is notable for euthymic and calmer affect, patient reports slight worsening in anxiety in the context of anniversary of her father in March.  Will uptitrate Paxil to target depression, anxiety and PTSD.  Discussed potential risk of medication induced mania.  Will continue Geodon for mood dysregulation.  Discussed risk of EPS.  Hold trazodone at this time  given patient has not taken this medication for a while.  Validated her grief.  Discussed behavioral activation.   Plan I have reviewed and updated plans as below 1. Increase Paxil 40 mg daily 2.Continueziprasidone 60 mg twice a day(QTc 450 msec on 03/16/2017) 3.hold Trazodone 4.[facility] for 15 mins - TSH checked by her PCP, Dr. Janna Archondiego - on gabapentin 300 mg BID  Past trials of medication: sertraline (weight gain), fluoxetine (SI), Lexapro, Effexor (headache), duloxetine (rash, headache), mirtazapine (rash), Paxil, Depakote, lithium (thyroid issues), carbamazepine (swelling), lamotrigine (swelling, headache, crying spells), quetiapine (swelling of throat,rash per patient), Geodon, Xanax, clonazepam, clonidine (dizziness), hydroxyzine (limited benefit)  The patient demonstrates the following risk factors for suicide: Chronic risk factors for suicide include: psychiatric disorder of bipolar disorderand chronic pain. Acute risk factorsfor suicide include: loss (financial, interpersonal, professional). Protective factorsfor this patient include: positive social support, coping skills and hope for the future. Considering these factors, the overall suicide risk at this point appears to be low. Patient isappropriate for outpatient follow up.  Neysa Hottereina Gevork Ayyad, MD 09/19/2018, 1:35 PM

## 2018-09-19 ENCOUNTER — Encounter (HOSPITAL_COMMUNITY): Payer: Self-pay | Admitting: Psychiatry

## 2018-09-19 ENCOUNTER — Other Ambulatory Visit: Payer: Self-pay

## 2018-09-19 ENCOUNTER — Ambulatory Visit (INDEPENDENT_AMBULATORY_CARE_PROVIDER_SITE_OTHER): Payer: Medicare Other | Admitting: Psychiatry

## 2018-09-19 VITALS — BP 139/83 | HR 86 | Ht 67.0 in | Wt 150.0 lb

## 2018-09-19 DIAGNOSIS — Z79899 Other long term (current) drug therapy: Secondary | ICD-10-CM | POA: Diagnosis not present

## 2018-09-19 DIAGNOSIS — F4310 Post-traumatic stress disorder, unspecified: Secondary | ICD-10-CM | POA: Diagnosis not present

## 2018-09-19 DIAGNOSIS — F319 Bipolar disorder, unspecified: Secondary | ICD-10-CM

## 2018-09-19 MED ORDER — PAROXETINE HCL 40 MG PO TABS: 40.0000 mg | ORAL_TABLET | Freq: Every day | ORAL | 0 refills | Status: DC

## 2018-09-19 MED ORDER — ZIPRASIDONE HCL 60 MG PO CAPS: 60.0000 mg | ORAL_CAPSULE | Freq: Two times a day (BID) | ORAL | 0 refills | Status: DC

## 2018-09-19 NOTE — Patient Instructions (Signed)
1. Increase paxil 40 mg daily 2.Continueziprasidone 60 mg twice a day 4.Return to clinicinthree month for 15 mins

## 2018-10-25 NOTE — Progress Notes (Signed)
Virtual Visit via Telephone Note  I connected with Kristen Ryan on 10/26/18 at 10:00 AM EDT by telephone and verified that I am speaking with the correct person using two identifiers.   I discussed the limitations, risks, security and privacy concerns of performing an evaluation and management service by telephone and the availability of in person appointments. I also discussed with the patient that there may be a patient responsible charge related to this service. The patient expressed understanding and agreed to proceed.   I discussed the assessment and treatment plan with the patient. The patient was provided an opportunity to ask questions and all were answered. The patient agreed with the plan and demonstrated an understanding of the instructions.   The patient was advised to call back or seek an in-person evaluation if the symptoms worsen or if the condition fails to improve as anticipated.  I provided 25 minutes of non-face-to-face time during this encounter.   Neysa Hotter, MD     [facility] MD/PA/NP OP Progress Note  10/26/2018 10:33 AM Kristen Ryan  MRN:  [id number]  Chief Complaint:  Chief Complaint    Follow-up; Other     HPI:  This is a follow-up visit for PTSD and bipolar disorder.  She states that she has been having restless leg, and she cannot control.  She ruminates on this topic for a while, stating that she is on constant move due to her symptoms.  It usually alleviates when she moves her legs.  It occurs throughout the day.  She has been having this for the past 2 weeks.  She states that things has been going well otherwise.  Her friend of 40 years moved into her place in February.  This friend had hip replacement and had issues with the family.  The patient and her husband both read that his friend move into their house.  She believes that this friend helps the patient to calm down.  Although she still misses her father, and feels that about the way he died  ("murdered,") she believes that he is in a safer place.  She feels depressed at times.  She has occasional crying spells when she thinks about her father.  She has good motivation and energy.  She denies SI.  She has AH of hearing some voices; she tries not to talk about it as it usually got worse when she talks about.  She denies CAH.  She denies VH.  She sleeps 6 to 8 hours.  She feels "very manic" while talking with this Clinical research associate, stating that she has racing thoughts ("so much going on and I cannot categorize it.") She bought some items lately for the house; although it was not necessary, she did not plan to buy them in one time. She denies decreased need for sleep.    Visit Diagnosis:    ICD-10-CM   1. PTSD (post-traumatic stress disorder) F43.10   2. Bipolar 1 disorder (HCC) F31.9     Past Psychiatric History: Please see initial evaluation for full details. I have reviewed the history. No updates at this time.     Past Medical History:  Past Medical History:  Diagnosis Date  . Anemia   . Asthma   . Bipolar 1 disorder (HCC)   . Bronchitis   . COPD (chronic obstructive pulmonary disease) (HCC)   . Diverticulitis   . IBS (irritable bowel syndrome)   . Migraine   . Neuropathy   . Schizophrenia (HCC)   .  Thyroid disease     Past Surgical History:  Procedure Laterality Date  . ABDOMINAL HYSTERECTOMY    . broke left arm    . CESAREAN SECTION    . COLONOSCOPY N/A 08/30/2014   Procedure: COLONOSCOPY;  Surgeon: Malissa HippoNajeeb U Rehman, MD;  Location: [facility];  Service: Endoscopy;  Laterality: N/A;  1200  . ELBOW SURGERY    . FOOT SURGERY    . KNEE SURGERY    . TONSILLECTOMY      Family Psychiatric History: Please see initial evaluation for full details. I have reviewed the history. No updates at this time.     Family History:  Family History  Problem Relation Age of Onset  . Dementia Mother   . Bipolar disorder Mother   . Dementia Father   . Other Son        MVA accident     Social History:  Social History   Socioeconomic History  . Marital status: Married    Spouse name: Not on file  . Number of children: Not on file  . Years of education: Not on file  . Highest education level: Not on file  Occupational History  . Not on file  Social Needs  . Financial resource strain: Not on file  . Food insecurity:    Worry: Not on file    Inability: Not on file  . Transportation needs:    Medical: Not on file    Non-medical: Not on file  Tobacco Use  . Smoking status: Former Smoker    Packs/day: 4.00    Types: Cigarettes    Last attempt to quit: 07/06/2008    Years since quitting: 10.3  . Smokeless tobacco: Never Used  Substance and Sexual Activity  . Alcohol use: No  . Drug use: No  . Sexual activity: Yes    Birth control/protection: Surgical    Comment: hyst  Lifestyle  . Physical activity:    Days per week: Not on file    Minutes per session: Not on file  . Stress: Not on file  Relationships  . Social connections:    Talks on phone: Not on file    Gets together: Not on file    Attends religious service: Not on file    Active member of club or organization: Not on file    Attends meetings of clubs or organizations: Not on file    Relationship status: Not on file  Other Topics Concern  . Not on file  Social History Narrative  . Not on file    Allergies:  Allergies  Allergen Reactions  . Aminophylline Anaphylaxis  . Sumatriptan Anaphylaxis and Other (See Comments)    Causes fainting  . Theophylline Anaphylaxis  . Codeine Itching and Other (See Comments)    Too strong for patient   . Depakote [Divalproex Sodium] Other (See Comments)    Hair loss  . Divalproex Sodium Other (See Comments)    Causes hair to fall out  . Doxycycline Other (See Comments)    headache  . Lamictal [Lamotrigine]     Destroys thyroid  . Lithium Other (See Comments)    Adverse reaction to Thyroid   . Magnesium-Containing Compounds Hives  . Metronidazole    . Nabumetone Swelling  . Naproxen Swelling  . Other     Hair dye  . Penicillins Nausea And Vomiting    Has patient had a PCN reaction causing immediate rash, facial/tongue/throat swelling, SOB or lightheadedness with hypotension: Yes Has  patient had a PCN reaction causing severe rash involving mucus membranes or skin necrosis: No Has patient had a PCN reaction that required hospitalization Yes Has patient had a PCN reaction occurring within the last 10 years: No If all of the above answers are "NO", then may proceed with Cephalosporin use.   Marland Kitchen Pentazocine Lactate Other (See Comments)    Unknown reaction  . Risperidone Other (See Comments)    Insomnia   . Septra Ds [Sulfamethoxazole-Trimethoprim]   . Seroquel [Quetiapine] Swelling  . Tegretol [Carbamazepine] Swelling  . Clonidine Derivatives     Dizziness at 0.1 mg  . Tetracycline Rash    Headaches  . Zyprexa [Olanzapine] Rash and Other (See Comments)    Insomnia    Metabolic Disorder Labs: No results found for: HGBA1C, MPG No results found for: PROLACTIN No results found for: CHOL, TRIG, HDL, CHOLHDL, VLDL, LDLCALC Lab Results  Component Value Date   TSH 0.888 07/07/2010    Therapeutic Level Labs: Lab Results  Component Value Date   LITHIUM 1.27 10/25/2013   No results found for: VALPROATE No components found for:  CBMZ  Current Medications: Current Outpatient Medications  Medication Sig Dispense Refill  . albuterol (PROVENTIL HFA;VENTOLIN HFA) 108 (90 Base) MCG/ACT inhaler Inhale 1-2 puffs into the lungs every 6 (six) hours as needed for wheezing or shortness of breath. 1 Inhaler 0  . aspirin 325 MG tablet Take 325 mg by mouth daily.    . budesonide-formoterol (SYMBICORT) 160-4.5 MCG/ACT inhaler Inhale 2 puffs into the lungs 2 (two) times daily. 1 Inhaler 1  . Calcium Carbonate (CALCIUM 600 PO) Take 1 tablet by mouth 2 (two) times daily.    Marland Kitchen dicyclomine (BENTYL) 10 MG capsule TAKE 1 CAPSULE BY MOUTH THREE TIMES  DAILY AS NEEDED FOR SPASMS. 90 capsule 2  . fluticasone (CUTIVATE) 0.05 % cream Apply topically 2 (two) times daily. Mix with ketoconazole 30 g 1  . gabapentin (NEURONTIN) 300 MG capsule Take 300 mg by mouth 2 (two) times daily.    Marland Kitchen ketoconazole (NIZORAL) 2 % cream Apply 1 application topically 2 (two) times daily. Mix with fluticasone 30 g 1  . levETIRAcetam (KEPPRA) 750 MG tablet Take 1 tablet by mouth at bedtime.    Marland Kitchen levothyroxine (SYNTHROID, LEVOTHROID) 50 MCG tablet Take 50 mcg by mouth daily.    Marland Kitchen loratadine (CLARITIN) 10 MG tablet Take 10 mg by mouth daily.    . Multiple Vitamin (MULTIVITAMIN WITH MINERALS) TABS tablet Take 1 tablet by mouth daily.    . naproxen (NAPROSYN) 500 MG tablet Take 500 mg by mouth 2 (two) times daily with a meal.    . oxyCODONE-acetaminophen (PERCOCET/ROXICET) 5-325 MG tablet Take 1 tablet by mouth 3 (three) times daily.    Marland Kitchen PARoxetine (PAXIL) 30 MG tablet Take 1 tablet (30 mg total) by mouth daily. 90 tablet 0  . Red Yeast Rice 600 MG CAPS Take 1 capsule by mouth 2 (two) times daily.    . Selenium 200 MCG TABS Take by mouth daily.    . sodium chloride (OCEAN) 0.65 % SOLN nasal spray Place 1 spray into both nostrils as needed for congestion.    . TRELEGY ELLIPTA 100-62.5-25 MCG/INH AEPB     . [START ON 12/20/2018] ziprasidone (GEODON) 60 MG capsule Take 1 capsule (60 mg total) by mouth 2 (two) times daily. 180 capsule 0   No current facility-administered medications for this visit.      Musculoskeletal: Strength & Muscle Tone: N/A Gait &  Station: N/A Patient leans: N/A  Psychiatric Specialty Exam: Review of Systems  Psychiatric/Behavioral: Positive for depression and hallucinations. Negative for memory loss, substance abuse and suicidal ideas. The patient is nervous/anxious. The patient does not have insomnia.   All other systems reviewed and are negative.   There were no vitals taken for this visit.There is no height or weight on file to calculate  BMI.  General Appearance: NA and Fairly Groomed  Eye Contact:  NA  Speech:  Clear and Coherent  Volume:  Normal  Mood:  restless  Affect:  NA  Thought Process:  Coherent  Orientation:  Full (Time, Place, and Person)  Thought Content: Logical   Suicidal Thoughts:  No  Homicidal Thoughts:  No  Memory:  Immediate;   Good  Judgement:  Good  Insight:  Fair  Psychomotor Activity:  Normal  Concentration:  Concentration: Good and Attention Span: Good  Recall:  Good  Fund of Knowledge: Good  Language: Good  Akathisia:  No  Handed:  Right  AIMS (if indicated): not done  Assets:  Communication Skills Desire for Improvement  ADL's:  Intact  Cognition: WNL  Sleep:  Good   Screenings: PHQ2-9     Office Visit from 07/28/2016 in [facility] Office Visit from 06/15/2016 in [facility]  PHQ-2 Total Score  6  6  PHQ-9 Total Score  14  14       Assessment and Plan:  ZYRA PARRILLO is a 63 y.o. year old female with a history of bipolar I disorder, PTSD, COPD, IBS, neuropathy , who presents for follow up appointment for PTSD (post-traumatic stress disorder)  Bipolar 1 disorder (HCC)  # Bipolar I disorder, mixed # PTSD # Restless leg # r/o dissociative identity disorder Although patient reports steady improvement in mood symptoms since her last visit, she reports new restarted symptoms of restless leg for the past 2 weeks, which coincided with uptitration of Paxil.  Will taper down Paxil to see if it alleviates her symptoms.  Will continue Geodon for mood dysregulation.  Discussed risk of EPS and QTC prolongation.  Discussed behavioral activation.   Plan I have reviewed and updated plans as below 1.Decrease Paxil 30 mg daily 2.Continueziprasidone 60 mg twice a day(QTc 450 msec on 03/16/2017) 3. Next appointment 6/24 at 10 AM for 20 mins - TSH checked by her PCP, Dr. Janna Arch - on gabapentin 300 mg BID  Past trials of medication: sertraline (weight gain),  fluoxetine (SI), Lexapro, Effexor (headache), duloxetine (rash, headache), mirtazapine (rash), Paxil, Depakote, lithium (thyroid issues), carbamazepine (swelling), lamotrigine (swelling, headache, crying spells), quetiapine (swelling of throat,rash per patient), Geodon, Xanax, clonazepam, clonidine (dizziness), hydroxyzine (limited benefit)  The patient demonstrates the following risk factors for suicide: Chronic risk factors for suicide include: psychiatric disorder of bipolar disorderand chronic pain. Acute risk factorsfor suicide include: loss (financial, interpersonal, professional). Protective factorsfor this patient include: positive social support, coping skills and hope for the future. Considering these factors, the overall suicide risk at this point appears to be low. Patient isappropriate for outpatient follow up.  The duration of this appointment visit was 25 minutes of non face-to-face time with the patient.  Greater than 50% of this time was spent in counseling, explanation of  diagnosis, planning of further management, and coordination of care.  Neysa Hotter, MD 10/26/2018, 10:33 AM

## 2018-10-26 ENCOUNTER — Encounter (HOSPITAL_COMMUNITY): Payer: Self-pay | Admitting: Psychiatry

## 2018-10-26 ENCOUNTER — Other Ambulatory Visit: Payer: Self-pay

## 2018-10-26 ENCOUNTER — Ambulatory Visit (INDEPENDENT_AMBULATORY_CARE_PROVIDER_SITE_OTHER): Payer: Medicare Other | Admitting: Psychiatry

## 2018-10-26 DIAGNOSIS — F319 Bipolar disorder, unspecified: Secondary | ICD-10-CM

## 2018-10-26 DIAGNOSIS — F4310 Post-traumatic stress disorder, unspecified: Secondary | ICD-10-CM

## 2018-10-26 MED ORDER — ZIPRASIDONE HCL 60 MG PO CAPS: 60.0000 mg | ORAL_CAPSULE | Freq: Two times a day (BID) | ORAL | 0 refills | Status: DC

## 2018-10-26 MED ORDER — PAROXETINE HCL 30 MG PO TABS: 30.0000 mg | ORAL_TABLET | Freq: Every day | ORAL | 0 refills | Status: DC

## 2018-12-02 ENCOUNTER — Other Ambulatory Visit: Payer: Self-pay | Admitting: Adult Health

## 2018-12-02 ENCOUNTER — Other Ambulatory Visit (INDEPENDENT_AMBULATORY_CARE_PROVIDER_SITE_OTHER): Payer: Self-pay | Admitting: Internal Medicine

## 2018-12-14 ENCOUNTER — Other Ambulatory Visit: Payer: Self-pay | Admitting: Adult Health

## 2018-12-20 ENCOUNTER — Ambulatory Visit (HOSPITAL_COMMUNITY): Payer: Medicare Other | Admitting: Psychiatry

## 2018-12-22 NOTE — Progress Notes (Signed)
Virtual Visit via Telephone Note  I connected with Kristen Ryan on 12/28/18 at 10:00 AM EDT by telephone and verified that I am speaking with the correct person using two identifiers.   I discussed the limitations, risks, security and privacy concerns of performing an evaluation and management service by telephone and the availability of in person appointments. I also discussed with the patient that there may be a patient responsible charge related to this service. The patient expressed understanding and agreed to proceed.    I discussed the assessment and treatment plan with the patient. The patient was provided an opportunity to ask questions and all were answered. The patient agreed with the plan and demonstrated an understanding of the instructions.   The patient was advised to call back or seek an in-person evaluation if the symptoms worsen or if the condition fails to improve as anticipated.  I provided 15 minutes of non-face-to-face time during this encounter.   [name], MD    [facility] Progress Note  12/28/2018 10:33 AM Kristen BeachColette J Ryan  MRN:  [id number]  Chief Complaint:  Chief Complaint    Follow-up; Other; Trauma; Depression     HPI:  This is a follow-up appointment for PTSD and bipolar disorder.  She states that she has been feeling very weak.  She was found to have iron deficiency anemia.  She states that "he (Dr. Janna Archondiego) is not giving me lot of hope," stating that they have not been able to find the source of anemia.  She feels scared as she also has shortness of breath.  Her husband and her friend at home is very supportive to the patient.  She has "fine" relationship with her son, although she does not elaborate at this time.  She was "fine" on father's day.  Although she was initially asked to adjust her medication for anxiety, she agrees to stay on the current dose to see how it goes with her treatment of anemia.  She has insomnia.  She feels fatigue.   She feels depressed.  She has fair concentration.  She denies SI.  She feels anxious and tense.  She has occasional panic attacks.  She denies decreased need for sleep or euphoria.  She denies nightmares of flashback. She has hypervigilance.   Visit Diagnosis:    ICD-10-CM   1. PTSD (post-traumatic stress disorder)  F43.10   2. Bipolar 1 disorder (HCC)  F31.9     Past Psychiatric History: Please see initial evaluation for full details. I have reviewed the history. No updates at this time.     Past Medical History:  Past Medical History:  Diagnosis Date  . Anemia   . Asthma   . Bipolar 1 disorder (HCC)   . Bronchitis   . COPD (chronic obstructive pulmonary disease) (HCC)   . Diverticulitis   . IBS (irritable bowel syndrome)   . Migraine   . Neuropathy   . Schizophrenia (HCC)   . Thyroid disease     Past Surgical History:  Procedure Laterality Date  . ABDOMINAL HYSTERECTOMY    . broke left arm    . CESAREAN SECTION    . COLONOSCOPY N/A 08/30/2014   Procedure: COLONOSCOPY;  Surgeon: Malissa HippoNajeeb U Rehman, MD;  Location: [facility];  Service: Endoscopy;  Laterality: N/A;  1200  . ELBOW SURGERY    . FOOT SURGERY    . KNEE SURGERY    . TONSILLECTOMY      Family Psychiatric History: Please  see initial evaluation for full details. I have reviewed the history. No updates at this time.     Family History:  Family History  Problem Relation Age of Onset  . Dementia Mother   . Bipolar disorder Mother   . Dementia Father   . Other Son        MVA accident    Social History:  Social History   Socioeconomic History  . Marital status: Married    Spouse name: Not on file  . Number of children: Not on file  . Years of education: Not on file  . Highest education level: Not on file  Occupational History  . Not on file  Social Needs  . Financial resource strain: Not on file  . Food insecurity    Worry: Not on file    Inability: Not on file  . Transportation needs     Medical: Not on file    Non-medical: Not on file  Tobacco Use  . Smoking status: Former Smoker    Packs/day: 4.00    Types: Cigarettes    Quit date: 07/06/2008    Years since quitting: 10.4  . Smokeless tobacco: Never Used  Substance and Sexual Activity  . Alcohol use: No  . Drug use: No  . Sexual activity: Yes    Birth control/protection: Surgical    Comment: hyst  Lifestyle  . Physical activity    Days per week: Not on file    Minutes per session: Not on file  . Stress: Not on file  Relationships  . Social Musicianconnections    Talks on phone: Not on file    Gets together: Not on file    Attends religious service: Not on file    Active member of club or organization: Not on file    Attends meetings of clubs or organizations: Not on file    Relationship status: Not on file  Other Topics Concern  . Not on file  Social History Narrative  . Not on file    Allergies:  Allergies  Allergen Reactions  . Aminophylline Anaphylaxis  . Sumatriptan Anaphylaxis and Other (See Comments)    Causes fainting  . Theophylline Anaphylaxis  . Codeine Itching and Other (See Comments)    Too strong for patient   . Depakote [Divalproex Sodium] Other (See Comments)    Hair loss  . Divalproex Sodium Other (See Comments)    Causes hair to fall out  . Doxycycline Other (See Comments)    headache  . Lamictal [Lamotrigine]     Destroys thyroid  . Lithium Other (See Comments)    Adverse reaction to Thyroid   . Magnesium-Containing Compounds Hives  . Metronidazole   . Nabumetone Swelling  . Naproxen Swelling  . Other     Hair dye  . Penicillins Nausea And Vomiting    Has patient had a PCN reaction causing immediate rash, facial/tongue/throat swelling, SOB or lightheadedness with hypotension: Yes Has patient had a PCN reaction causing severe rash involving mucus membranes or skin necrosis: No Has patient had a PCN reaction that required hospitalization Yes Has patient had a PCN reaction  occurring within the last 10 years: No If all of the above answers are "NO", then may proceed with Cephalosporin use.   Marland Kitchen. Pentazocine Lactate Other (See Comments)    Unknown reaction  . Risperidone Other (See Comments)    Insomnia   . Septra Ds [Sulfamethoxazole-Trimethoprim]   . Seroquel [Quetiapine] Swelling  . Tegretol [Carbamazepine] Swelling  .  Clonidine Derivatives     Dizziness at 0.1 mg  . Tetracycline Rash    Headaches  . Zyprexa [Olanzapine] Rash and Other (See Comments)    Insomnia    Metabolic Disorder Labs: No results found for: HGBA1C, MPG No results found for: PROLACTIN No results found for: CHOL, TRIG, HDL, CHOLHDL, VLDL, LDLCALC Lab Results  Component Value Date   TSH 0.888 07/07/2010    Therapeutic Level Labs: Lab Results  Component Value Date   LITHIUM 1.27 10/25/2013   No results found for: VALPROATE No components found for:  CBMZ  Current Medications: Current Outpatient Medications  Medication Sig Dispense Refill  . ferrous sulfate 220 (44 Fe) MG/5ML solution Take 220 mg by mouth 2 (two) times a day.    . pantoprazole (PROTONIX) 40 MG tablet Take 40 mg by mouth 2 (two) times a day.    . albuterol (PROVENTIL HFA;VENTOLIN HFA) 108 (90 Base) MCG/ACT inhaler Inhale 1-2 puffs into the lungs every 6 (six) hours as needed for wheezing or shortness of breath. 1 Inhaler 0  . aspirin 325 MG tablet Take 325 mg by mouth daily.    . budesonide-formoterol (SYMBICORT) 160-4.5 MCG/ACT inhaler Inhale 2 puffs into the lungs 2 (two) times daily. 1 Inhaler 1  . Calcium Carbonate (CALCIUM 600 PO) Take 1 tablet by mouth 2 (two) times daily.    Marland Kitchen. dicyclomine (BENTYL) 10 MG capsule TAKE 1 CAPSULE BY MOUTH THREE TIMES DAILY AS NEEDED FOR SPASMS. 90 capsule 3  . fluticasone (CUTIVATE) 0.05 % cream MIX WITH KETOCONAZOLE CREAM AND APPLY TO AFFECTED AREA UP TO TWICE DAILY AS NEEDED. 30 g 0  . gabapentin (NEURONTIN) 300 MG capsule Take 300 mg by mouth 2 (two) times daily.    Marland Kitchen.  ketoconazole (NIZORAL) 2 % cream MIX WITH FLUTICASONE CREAM AND APPLY TO AFFECTED AREA UP TO TWICE DAILY AS NEEDED. 30 g 0  . levETIRAcetam (KEPPRA) 750 MG tablet Take 1 tablet by mouth at bedtime.    Marland Kitchen. levothyroxine (SYNTHROID, LEVOTHROID) 50 MCG tablet Take 50 mcg by mouth daily.    Marland Kitchen. loratadine (CLARITIN) 10 MG tablet Take 10 mg by mouth daily.    . Multiple Vitamin (MULTIVITAMIN WITH MINERALS) TABS tablet Take 1 tablet by mouth daily.    . naproxen (NAPROSYN) 500 MG tablet Take 500 mg by mouth 2 (two) times daily with a meal.    . oxyCODONE-acetaminophen (PERCOCET/ROXICET) 5-325 MG tablet Take 1 tablet by mouth 3 (three) times daily.    Melene Muller. [START ON 01/24/2019] PARoxetine (PAXIL) 30 MG tablet Take 1 tablet (30 mg total) by mouth daily. 90 tablet 0  . Red Yeast Rice 600 MG CAPS Take 1 capsule by mouth 2 (two) times daily.    . Selenium 200 MCG TABS Take by mouth daily.    . sodium chloride (OCEAN) 0.65 % SOLN nasal spray Place 1 spray into both nostrils as needed for congestion.    . TRELEGY ELLIPTA 100-62.5-25 MCG/INH AEPB     . [START ON 01/25/2019] ziprasidone (GEODON) 60 MG capsule Take 1 capsule (60 mg total) by mouth 2 (two) times daily. 180 capsule 0   No current facility-administered medications for this visit.      Musculoskeletal: Strength & Muscle Tone: N/A Gait & Station: N/A Patient leans: N/A  Psychiatric Specialty Exam: Review of Systems  Psychiatric/Behavioral: Positive for depression. Negative for hallucinations, memory loss, substance abuse and suicidal ideas. The patient is nervous/anxious and has insomnia.   All other systems reviewed  and are negative.   There were no vitals taken for this visit.There is no height or weight on file to calculate BMI.  General Appearance: NA  Eye Contact:  NA  Speech:  Clear and Coherent  Volume:  Normal  Mood:  Anxious  Affect:  NA  Thought Process:  Coherent  Orientation:  Full (Time, Place, and Person)  Thought Content:  Logical   Suicidal Thoughts:  No  Homicidal Thoughts:  No  Memory:  Immediate;   Good  Judgement:  Good  Insight:  Fair  Psychomotor Activity:  Normal  Concentration:  Concentration: Good and Attention Span: Good  Recall:  Good  Fund of Knowledge: Good  Language: Good  Akathisia:  No  Handed:  Right  AIMS (if indicated): not done  Assets:  Communication Skills Desire for Improvement  ADL's:  Intact  Cognition: WNL  Sleep:  Poor   Screenings: PHQ2-9     Office Visit from 07/28/2016 in [place] Office Visit from 06/15/2016 in [facility]  PHQ-2 Total Score  6  6  PHQ-9 Total Score  14  14       Assessment and Plan:  TYONNA TALERICO is a 63 y.o. year old female with a history of bipolar I disorder, PTSD, COPD, IBS, neuropathy  , who presents for follow up appointment for PTSD.   # Bipolar I disorder, mixed # PTSD # r/o dissociative identity disorder She reports slight worsening in anxiety and prominent fatigue in the context of iron deficiency anemia.  Discussed with the patient to continue current dose of medication with the hope that the treatment of anemia will alleviate her mood symptoms. Will continue Paxil at the current dose to target depression, anxiety.  Will continue Geodon to target bipolar disorder.  Discussed risk of EPS and QTC prolongation.   Plan I have reviewed and updated plans as below 1. Continue Paxil 30 mg daily 2.Continueziprasidone 60 mg twice a day(QTc 450 msec on 03/16/2017) 3. Next appointment: 8/27 at 3:40 for 20 mins, phone - TSH checked by her PCP, Dr. Cindie Laroche - on gabapentin 300 mg BID  Past trials of medication: sertraline (weight gain), fluoxetine (SI), Lexapro, Effexor (headache), duloxetine (rash, headache), mirtazapine (rash), Paxil, Depakote, lithium (thyroid issues), carbamazepine (swelling), lamotrigine (swelling, headache, crying spells), quetiapine (swelling of throat,rash per patient), Geodon, Xanax,  clonazepam, clonidine (dizziness), hydroxyzine (limited benefit)  The patient demonstrates the following risk factors for suicide: Chronic risk factors for suicide include: psychiatric disorder of bipolar disorderand chronic pain. Acute risk factorsfor suicide include: loss (financial, interpersonal, professional). Protective factorsfor this patient include: positive social support, coping skills and hope for the future. Considering these factors, the overall suicide risk at this point appears to be low. Patient isappropriate for outpatient follow up.  Norman Clay, MD 12/28/2018, 10:33 AM

## 2018-12-28 ENCOUNTER — Other Ambulatory Visit: Payer: Self-pay

## 2018-12-28 ENCOUNTER — Ambulatory Visit (INDEPENDENT_AMBULATORY_CARE_PROVIDER_SITE_OTHER): Payer: Medicare Other | Admitting: Psychiatry

## 2018-12-28 ENCOUNTER — Encounter (HOSPITAL_COMMUNITY): Payer: Self-pay | Admitting: Psychiatry

## 2018-12-28 DIAGNOSIS — F4310 Post-traumatic stress disorder, unspecified: Secondary | ICD-10-CM

## 2018-12-28 DIAGNOSIS — F319 Bipolar disorder, unspecified: Secondary | ICD-10-CM | POA: Diagnosis not present

## 2018-12-28 MED ORDER — ZIPRASIDONE HCL 60 MG PO CAPS: 60.0000 mg | ORAL_CAPSULE | Freq: Two times a day (BID) | ORAL | 0 refills | Status: DC

## 2018-12-28 MED ORDER — PAROXETINE HCL 30 MG PO TABS: 30.0000 mg | ORAL_TABLET | Freq: Every day | ORAL | 0 refills | Status: DC

## 2018-12-28 NOTE — Patient Instructions (Signed)
1. Continue Paxil 30 mg daily 2.Continueziprasidone 60 mg twice a day 3. Next appointment: 8/27 at 3:40

## 2018-12-31 ENCOUNTER — Other Ambulatory Visit: Payer: Self-pay

## 2018-12-31 ENCOUNTER — Encounter (HOSPITAL_COMMUNITY): Payer: Self-pay | Admitting: Emergency Medicine

## 2018-12-31 ENCOUNTER — Emergency Department (HOSPITAL_COMMUNITY)
Admission: EM | Admit: 2018-12-31 | Discharge: 2019-01-01 | Disposition: A | Discharge status: Home / Self Care | Payer: Medicare Other | Attending: Emergency Medicine | Admitting: Emergency Medicine

## 2018-12-31 ENCOUNTER — Emergency Department (HOSPITAL_COMMUNITY): Payer: Medicare Other

## 2018-12-31 DIAGNOSIS — J449 Chronic obstructive pulmonary disease, unspecified: Secondary | ICD-10-CM | POA: Insufficient documentation

## 2018-12-31 DIAGNOSIS — K5792 Diverticulitis of intestine, part unspecified, without perforation or abscess without bleeding: Secondary | ICD-10-CM

## 2018-12-31 DIAGNOSIS — Z87891 Personal history of nicotine dependence: Secondary | ICD-10-CM | POA: Insufficient documentation

## 2018-12-31 DIAGNOSIS — Z90710 Acquired absence of both cervix and uterus: Secondary | ICD-10-CM | POA: Insufficient documentation

## 2018-12-31 DIAGNOSIS — Z7982 Long term (current) use of aspirin: Secondary | ICD-10-CM | POA: Insufficient documentation

## 2018-12-31 DIAGNOSIS — R5383 Other fatigue: Secondary | ICD-10-CM | POA: Insufficient documentation

## 2018-12-31 DIAGNOSIS — Z79899 Other long term (current) drug therapy: Secondary | ICD-10-CM | POA: Insufficient documentation

## 2018-12-31 DIAGNOSIS — R1030 Lower abdominal pain, unspecified: Secondary | ICD-10-CM | POA: Diagnosis present

## 2018-12-31 LAB — CBC WITH DIFFERENTIAL/PLATELET
Abs Immature Granulocytes: 0.03 10*3/uL (ref 0.00–0.07)
Basophils Absolute: 0 10*3/uL (ref 0.0–0.1)
Basophils Relative: 1 %
Eosinophils Absolute: 0.3 10*3/uL (ref 0.0–0.5)
Eosinophils Relative: 5 %
HCT: 30.1 % — ABNORMAL LOW (ref 36.0–46.0)
Hemoglobin: 8.2 g/dL — ABNORMAL LOW (ref 12.0–15.0)
Immature Granulocytes: 1 %
Lymphocytes Relative: 22 %
Lymphs Abs: 1.5 10*3/uL (ref 0.7–4.0)
MCH: 22.9 pg — ABNORMAL LOW (ref 26.0–34.0)
MCHC: 27.2 g/dL — ABNORMAL LOW (ref 30.0–36.0)
MCV: 84.1 fL (ref 80.0–100.0)
Monocytes Absolute: 0.8 10*3/uL (ref 0.1–1.0)
Monocytes Relative: 13 %
Neutro Abs: 3.8 10*3/uL (ref 1.7–7.7)
Neutrophils Relative %: 58 %
Platelets: 278 10*3/uL (ref 150–400)
RBC: 3.58 MIL/uL — ABNORMAL LOW (ref 3.87–5.11)
RDW: 18.4 % — ABNORMAL HIGH (ref 11.5–15.5)
WBC: 6.5 10*3/uL (ref 4.0–10.5)
nRBC: 0 % (ref 0.0–0.2)

## 2018-12-31 LAB — COMPREHENSIVE METABOLIC PANEL
ALT: 10 U/L (ref 0–44)
AST: 13 U/L — ABNORMAL LOW (ref 15–41)
Albumin: 3.1 g/dL — ABNORMAL LOW (ref 3.5–5.0)
Alkaline Phosphatase: 69 U/L (ref 38–126)
Anion gap: 10 (ref 5–15)
BUN: 12 mg/dL (ref 8–23)
CO2: 26 mmol/L (ref 22–32)
Calcium: 8.6 mg/dL — ABNORMAL LOW (ref 8.9–10.3)
Chloride: 103 mmol/L (ref 98–111)
Creatinine, Ser: 0.83 mg/dL (ref 0.44–1.00)
GFR calc Af Amer: >60 mL/min (ref >60)
GFR calc non Af Amer: >60 mL/min (ref >60)
Glucose, Bld: 112 mg/dL — ABNORMAL HIGH (ref 70–99)
Potassium: 3.8 mmol/L (ref 3.5–5.1)
Sodium: 139 mmol/L (ref 135–145)
Total Bilirubin: 0.4 mg/dL (ref 0.3–1.2)
Total Protein: 6 g/dL — ABNORMAL LOW (ref 6.5–8.1)

## 2018-12-31 LAB — LIPASE, BLOOD: Lipase: 30 U/L (ref 11–51)

## 2018-12-31 MED ORDER — CIPROFLOXACIN HCL 250 MG PO TABS
500.0000 mg | ORAL_TABLET | Freq: Once | ORAL | Status: AC
  Administered 2018-12-31: 500 mg via ORAL
  Filled 2018-12-31: qty 2

## 2018-12-31 MED ORDER — METRONIDAZOLE 500 MG PO TABS: 500.0000 mg | ORAL_TABLET | Freq: Two times a day (BID) | ORAL | 0 refills | Status: DC

## 2018-12-31 MED ORDER — CIPROFLOXACIN HCL 500 MG PO TABS: 500.0000 mg | ORAL_TABLET | Freq: Two times a day (BID) | ORAL | 0 refills | Status: DC

## 2018-12-31 MED ORDER — SODIUM CHLORIDE 0.9 % IV BOLUS
1000.0000 mL | Freq: Once | INTRAVENOUS | Status: AC
  Administered 2018-12-31: 21:00:00 1000 mL via INTRAVENOUS

## 2018-12-31 MED ORDER — IOHEXOL 300 MG/ML  SOLN
100.0000 mL | Freq: Once | INTRAMUSCULAR | Status: AC | PRN
  Administered 2018-12-31: 100 mL via INTRAVENOUS

## 2018-12-31 MED ORDER — METRONIDAZOLE 500 MG PO TABS
500.0000 mg | ORAL_TABLET | Freq: Once | ORAL | Status: AC
  Administered 2018-12-31: 500 mg via ORAL
  Filled 2018-12-31: qty 1

## 2018-12-31 MED ORDER — FENTANYL CITRATE (PF) 100 MCG/2ML IJ SOLN
25.0000 ug | Freq: Once | INTRAMUSCULAR | Status: AC
  Administered 2018-12-31: 25 ug via INTRAVENOUS
  Filled 2018-12-31: qty 2

## 2018-12-31 NOTE — ED Provider Notes (Signed)
[facility] EMERGENCY DEPARTMENT Provider Note   CSN: [id number] Arrival date & time: 12/31/18  1934     History   Chief Complaint Chief Complaint  Patient presents with  . Anemia  . Abdominal Pain    HPI Kristen Ryan is a 63 y.o. female.     HPI   Kristen Ryan is a 63 y.o. female with past medical history of anemia, asthma, diverticulitis and COPD presents to the Emergency Department complaining of worsening abdominal pain for 4 days.  She states that she was recently seen by her PCP for generalized fatigue and she was started on iron supplementation.  Since that time, she reports increasing lower abdominal pain with left side greater than right.  Her pain is been associated with some nausea and vomiting yesterday.  No vomiting today.  She denies diarrhea, black or bloody stools, chest pain, shortness of breath, dysuria and fever.  She states her current pain feels similar to previous episodes of diverticulitis.    Past Medical History:  Diagnosis Date  . Anemia   . Asthma   . Bipolar 1 disorder ([place])   . Bronchitis   . COPD (chronic obstructive pulmonary disease) ([place])   . Diverticulitis   . IBS (irritable bowel syndrome)   . Migraine   . Neuropathy   . Schizophrenia ([place])   . Thyroid disease     Patient Active Problem List   Diagnosis Date Noted  . PTSD (post-traumatic stress disorder) 12/11/2016  . Bipolar 1 disorder, mixed, moderate ([place]) 08/27/2016  . Well woman exam with routine gynecological exam 06/15/2016  . COPD (chronic obstructive pulmonary disease) with acute bronchitis ([place]) 08/23/2015  . Acute respiratory failure with hypoxia ([place]) 08/23/2015  . Sigmoid diverticulitis 08/03/2015  . Hypokalemia 08/03/2015  . Normocytic anemia 08/03/2015  . COPD with asthma ([place])   . Thyroid activity decreased   . Bipolar disorder in partial remission ([place])   . Bipolar disorder ([place]) 02/14/2015  . Midline thoracic back pain 08/21/2014  . Abnormality of  gait 08/21/2014  . Urinary urgency 08/21/2014  . ASTHMA 03/27/2008  . COLLES' FRACTURE, LEFT WRIST 03/27/2008    Past Surgical History:  Procedure Laterality Date  . ABDOMINAL HYSTERECTOMY    . broke left arm    . CESAREAN SECTION    . COLONOSCOPY N/A 08/30/2014   Procedure: COLONOSCOPY;  Surgeon: Rogene Houston, MD;  Location: [facility] ENDO SUITE;  Service: Endoscopy;  Laterality: N/A;  1200  . ELBOW SURGERY    . FOOT SURGERY    . KNEE SURGERY    . TONSILLECTOMY       OB History    Gravida  2   Para  1   Term  1   Preterm      AB  1   Living  1     SAB  1   TAB      Ectopic      Multiple      Live Births  1            Home Medications    Prior to Admission medications   Medication Sig Start Date End Date Taking? Authorizing Provider  albuterol (PROVENTIL HFA;VENTOLIN HFA) 108 (90 Base) MCG/ACT inhaler Inhale 1-2 puffs into the lungs every 6 (six) hours as needed for wheezing or shortness of breath. 05/07/18  Yes Long, Wonda Olds, MD  aspirin 325 MG tablet Take 325 mg by mouth daily.   Yes [provider]  budesonide-formoterol (SYMBICORT) 160-4.5 MCG/ACT inhaler Inhale 2 puffs into the lungs 2 (two) times daily. 03/25/14  Yes Lorre NickAllen, Anthony, MD  Calcium Carbonate (CALCIUM 600 PO) Take 1 tablet by mouth 2 (two) times daily.   Yes [provider]  dicyclomine (BENTYL) 10 MG capsule Take 1 capsule by mouth 3 (three) times daily. 12/05/18  Yes [provider]  ferrous sulfate 220 (44 Fe) MG/5ML solution Take 220 mg by mouth 2 (two) times a day.   Yes [provider]  fluticasone (CUTIVATE) 0.05 % cream MIX WITH KETOCONAZOLE CREAM AND APPLY TO AFFECTED AREA UP TO TWICE DAILY AS NEEDED. 12/14/18  Yes Cyril MourningGriffin, Jennifer A, NP  gabapentin (NEURONTIN) 300 MG capsule Take 300 mg by mouth 2 (two) times daily.   Yes [provider]  ketoconazole (NIZORAL) 2 % cream MIX WITH FLUTICASONE CREAM AND APPLY TO AFFECTED AREA UP TO TWICE DAILY  AS NEEDED. 12/14/18  Yes Adline PotterGriffin, Jennifer A, NP  levETIRAcetam (KEPPRA) 750 MG tablet Take 1 tablet by mouth daily. 12/27/18  Yes [provider]  levothyroxine (SYNTHROID) 50 MCG tablet Take 1 tablet by mouth daily. 12/27/18  Yes [provider]  loratadine (CLARITIN) 10 MG tablet Take 10 mg by mouth daily.   Yes [provider]  metoprolol tartrate (LOPRESSOR) 25 MG tablet Take 1 tablet by mouth 2 (two) times a day. 12/27/18  Yes [provider]  Multiple Vitamin (MULTIVITAMIN WITH MINERALS) TABS tablet Take 1 tablet by mouth daily.   Yes [provider]  naproxen (NAPROSYN) 500 MG tablet Take 500 mg by mouth 2 (two) times daily with a meal.   Yes [provider]  oxyCODONE-acetaminophen (PERCOCET/ROXICET) 5-325 MG tablet Take 1 tablet by mouth 3 (three) times daily as needed for pain. 12/07/18  Yes [provider]  pantoprazole (PROTONIX) 40 MG tablet Take 1 tablet by mouth daily. 12/23/18  Yes [provider]  PARoxetine (PAXIL) 30 MG tablet Take 1 tablet (30 mg total) by mouth daily. 01/24/19  Yes Hisada, Barbee Cougheina, MD  Red Yeast Rice 600 MG CAPS Take 1 capsule by mouth 2 (two) times daily.   Yes [provider]  Selenium 200 MCG TABS Take by mouth daily.   Yes [provider]  sodium chloride (OCEAN) 0.65 % SOLN nasal spray Place 1 spray into both nostrils as needed for congestion.   Yes [provider]  Dwyane LuoRELEGY ELLIPTA 100-62.5-25 MCG/INH AEPB  08/16/18  Yes [provider]  ziprasidone (GEODON) 60 MG capsule Take 1 capsule by mouth 2 (two) times a day. 11/13/18  Yes [provider]    Family History Family History  Problem Relation Age of Onset  . Dementia Mother   . Bipolar disorder Mother   . Dementia Father   . Other Son        MVA accident    Social History Social History   Tobacco Use  . Smoking status: Former Smoker    Packs/day: 4.00    Types: Cigarettes    Quit  date: 07/06/2008    Years since quitting: 10.4  . Smokeless tobacco: Never Used  Substance Use Topics  . Alcohol use: No  . Drug use: No     Allergies   Aminophylline, Sumatriptan, Theophylline, Codeine, Depakote [divalproex sodium], Divalproex sodium, Doxycycline, Lamictal [lamotrigine], Lithium, Magnesium-containing compounds, Metronidazole, Nabumetone, Naproxen, Other, Penicillins, Pentazocine lactate, Risperidone, Septra ds [sulfamethoxazole-trimethoprim], Seroquel [quetiapine], Tegretol [carbamazepine], Clonidine derivatives, Tetracycline, and Zyprexa [olanzapine]   Review of Systems Review of  Systems  Constitutional: Positive for fatigue. Negative for appetite change, chills and fever.  Respiratory: Negative for shortness of breath.   Cardiovascular: Negative for chest pain.  Gastrointestinal: Positive for abdominal pain and nausea. Negative for blood in stool and vomiting.  Genitourinary: Negative for decreased urine volume, difficulty urinating, dysuria and flank pain.  Musculoskeletal: Negative for back pain.  Skin: Negative for color change and rash.  Neurological: Negative for dizziness, syncope, speech difficulty, weakness and numbness.  Hematological: Negative for adenopathy.     Physical Exam Updated Vital Signs BP (!) 121/99   Pulse 76   Temp 98.9 F (37.2 C) (Oral)   Resp (!) 22   SpO2 94%   Physical Exam Vitals signs and nursing note reviewed.  Constitutional:      Appearance: Normal appearance. She is not ill-appearing or toxic-appearing.  HENT:     Head: Atraumatic.     Mouth/Throat:     Mouth: Mucous membranes are moist.  Neck:     Musculoskeletal: Normal range of motion.  Cardiovascular:     Rate and Rhythm: Normal rate and regular rhythm.  Pulmonary:     Effort: Pulmonary effort is normal.     Breath sounds: Normal breath sounds. No stridor. No rales.  Abdominal:     General: There is no distension.     Palpations: Abdomen is soft. There is no  mass.     Tenderness: There is abdominal tenderness in the left lower quadrant. There is no right CVA tenderness, left CVA tenderness or guarding.     Hernia: No hernia is present.     Comments: Tenderness palpation left lower quadrant.  No guarding or rebound tenderness.  Abdomen is soft.  Musculoskeletal: Normal range of motion.  Skin:    General: Skin is warm.     Capillary Refill: Capillary refill takes less than 2 seconds.     Findings: No rash.  Neurological:     General: No focal deficit present.     Mental Status: She is alert.     Sensory: Sensation is intact. No sensory deficit.     Motor: Motor function is intact. No weakness.     Coordination: Coordination is intact.     Comments: CN II-XII grossly intact.  Speech clear.        ED Treatments / Results  Labs (all labs ordered are listed, but only abnormal results are displayed) Labs Reviewed  COMPREHENSIVE METABOLIC PANEL - Abnormal; Notable for the following components:      Result Value   Glucose, Bld 112 (*)    Calcium 8.6 (*)    Total Protein 6.0 (*)    Albumin 3.1 (*)    AST 13 (*)    All other components within normal limits  CBC WITH DIFFERENTIAL/PLATELET - Abnormal; Notable for the following components:   RBC 3.58 (*)    Hemoglobin 8.2 (*)    HCT 30.1 (*)    MCH 22.9 (*)    MCHC 27.2 (*)    RDW 18.4 (*)    All other components within normal limits  LIPASE, BLOOD  URINALYSIS, ROUTINE W REFLEX MICROSCOPIC    EKG    Radiology Ct Abdomen Pelvis W Contrast  Result Date: 12/31/2018 CLINICAL DATA:  Abdominal pain with diverticulitis suspected. EXAM: CT ABDOMEN AND PELVIS WITH CONTRAST TECHNIQUE: Multidetector CT imaging of the abdomen and pelvis was performed using the standard protocol following bolus administration of intravenous contrast. CONTRAST:  100mL OMNIPAQUE IOHEXOL 300 MG/ML  SOLN  COMPARISON:  CT of the pelvis dated March 01, 2016. FINDINGS: Lower chest: The heart size is mildly enlarged.  There is stable scarring versus chronic atelectasis in the lingula. Hepatobiliary: No focal liver abnormality is seen. No gallstones, gallbladder wall thickening, or biliary dilatation. Pancreas: Unremarkable. No pancreatic ductal dilatation or surrounding inflammatory changes. Spleen: Normal in size without focal abnormality. Adrenals/Urinary Tract: Adrenal glands are unremarkable. Kidneys are normal, without renal calculi, focal lesion, or hydronephrosis. Bladder is unremarkable. Stomach/Bowel: There is a small hiatal hernia, otherwise the stomach is unremarkable. There is some mildly dilated loops of small bowel scattered throughout the abdomen without evidence of a high-grade small bowel obstruction. There is evidence for acute uncomplicated sigmoid diverticulitis. There is no adjacent abscess. There is a large amount of stool throughout the colon. The appendix is unremarkable but contains multiple appendicular list. There is some stasis within the distal small bowel. Vascular/Lymphatic: Aortic atherosclerosis. No enlarged abdominal or pelvic lymph nodes. Reproductive: Status post hysterectomy. No adnexal masses. Other: No abdominal wall hernia or abnormality. No abdominopelvic ascites. Musculoskeletal: No acute or significant osseous findings. IMPRESSION: 1. Findings consistent with acute uncomplicated sigmoid diverticulitis. 2. Mild cardiomegaly. 3.  Aortic Atherosclerosis (ICD10-I70.0). Electronically Signed   By: Katherine Mantlehristopher  Green M.D.   On: 12/31/2018 23:06    Procedures Procedures (including critical care time)  Medications Ordered in ED Medications  fentaNYL (SUBLIMAZE) injection 25 mcg (has no administration in time range)  sodium chloride 0.9 % bolus 1,000 mL (0 mLs Intravenous Stopped 12/31/18 2227)  fentaNYL (SUBLIMAZE) injection 25 mcg (25 mcg Intravenous Given 12/31/18 2120)  iohexol (OMNIPAQUE) 300 MG/ML solution 100 mL (100 mLs Intravenous Contrast Given 12/31/18 2229)     Initial  Impression / Assessment and Plan / ED Course  I have reviewed the triage vital signs and the nursing notes.  Pertinent labs & imaging results that were available during my care of the patient were reviewed by me and considered in my medical decision making (see chart for details).        Patient medical record notes allergy to metronidazole.  I have asked the patient about this and she denies having an allergy to this medication.  She has an appointment with her PCP on Thursday regarding her anemia which appears chronic.  She is currently taking iron supplementation.  She denies any hematochezia or melena.  No hypotension.  Patient is well-appearing.  I feel she is appropriate for discharge home with prescriptions for Cipro and Flagyl.  She agrees to this plan.  Strict return precautions were discussed.  Final Clinical Impressions(s) / ED Diagnoses   Final diagnoses:  Diverticulitis    ED Discharge Orders    None       Pauline Ausriplett, Dex Blakely, PA-C 01/01/19 0000    Bethann BerkshireZammit, Joseph, MD 01/03/19 1242

## 2018-12-31 NOTE — ED Triage Notes (Signed)
PT States found out iron levels were now Tuesday but came today due to all over abd pain started 4 days ago. Worse today. N/v yesterday x 3. No diarrhea. A/o. pale

## 2019-01-01 NOTE — Discharge Instructions (Addendum)
Take the antibiotics as directed until they are finished.  Be sure to keep your appointment with your primary care provider for Thursday.  Bland diet as tolerated.  Return to the ER for any worsening symptoms such as increasing pain, fever or vomiting.

## 2019-02-01 ENCOUNTER — Other Ambulatory Visit: Payer: Self-pay | Admitting: Adult Health

## 2019-02-27 NOTE — Progress Notes (Signed)
Virtual Visit via Video Note  I connected with Kristen Ryan on 03/02/19 at  3:40 PM EDT by a video enabled telemedicine application and verified that I am speaking with the correct person using two identifiers.   I discussed the limitations of evaluation and management by telemedicine and the availability of in person appointments. The patient expressed understanding and agreed to proceed.     I discussed the assessment and treatment plan with the patient. The patient was provided an opportunity to ask questions and all were answered. The patient agreed with the plan and demonstrated an understanding of the instructions.   The patient was advised to call back or seek an in-person evaluation if the symptoms worsen or if the condition fails to improve as anticipated.  I provided 15 minutes of non-face-to-face time during this encounter.   Norman Clay, MD    [facility] MD/PA/NP OP Progress Note  03/02/2019 4:00 PM Kristen Ryan  MRN:  [id number]  Chief Complaint:  Chief Complaint    Trauma; Other; Follow-up     HPI:  This is a follow-up appointment for bipolar disorder and PTSD.  She states that she is having crying spells.  She misses her father.  She wants to know who murdered her father, although her brother decided not to pursue any charges. She feels depressed about this, and has not talked with her brother.  She reports good relationship with her friend, Butch Penny, who stays with the patient. She is trying to find another physician for the treatment for iron deficiency anemia. She has occasional insomnia.  She feels fatigued.  She has fair concentration. She has mild anhedonia.  She has poor appetite.  She denies SI.  She feels anxious and tense at times.  She denies panic attacks.  She denies decreased need for sleep or euphoria.    Visit Diagnosis:    ICD-10-CM   1. PTSD (post-traumatic stress disorder)  F43.10   2. Bipolar 1 disorder ([place])  F31.9     Past Psychiatric History:  Please see initial evaluation for full details. I have reviewed the history. No updates at this time.     Past Medical History:  Past Medical History:  Diagnosis Date  . Anemia   . Asthma   . Bipolar 1 disorder ([place])   . Bronchitis   . COPD (chronic obstructive pulmonary disease) ([place])   . Diverticulitis   . IBS (irritable bowel syndrome)   . Migraine   . Neuropathy   . Schizophrenia ([place])   . Thyroid disease     Past Surgical History:  Procedure Laterality Date  . ABDOMINAL HYSTERECTOMY    . broke left arm    . CESAREAN SECTION    . COLONOSCOPY N/A 08/30/2014   Procedure: COLONOSCOPY;  Surgeon: Rogene Houston, MD;  Location: [facility];  Service: Endoscopy;  Laterality: N/A;  1200  . ELBOW SURGERY    . FOOT SURGERY    . KNEE SURGERY    . TONSILLECTOMY      Family Psychiatric History: Please see initial evaluation for full details. I have reviewed the history. No updates at this time.     Family History:  Family History  Problem Relation Age of Onset  . Dementia Mother   . Bipolar disorder Mother   . Dementia Father   . Other Son        MVA accident    Social History:  Social History   Socioeconomic History  . Marital  status: Married    Spouse name: Not on file  . Number of children: Not on file  . Years of education: Not on file  . Highest education level: Not on file  Occupational History  . Not on file  Social Needs  . Financial resource strain: Not on file  . Food insecurity    Worry: Not on file    Inability: Not on file  . Transportation needs    Medical: Not on file    Non-medical: Not on file  Tobacco Use  . Smoking status: Former Smoker    Packs/day: 4.00    Types: Cigarettes    Quit date: 07/06/2008    Years since quitting: 10.6  . Smokeless tobacco: Never Used  Substance and Sexual Activity  . Alcohol use: No  . Drug use: No  . Sexual activity: Yes    Birth control/protection: Surgical    Comment: hyst  Lifestyle  . Physical  activity    Days per week: Not on file    Minutes per session: Not on file  . Stress: Not on file  Relationships  . Social Musicianconnections    Talks on phone: Not on file    Gets together: Not on file    Attends religious service: Not on file    Active member of club or organization: Not on file    Attends meetings of clubs or organizations: Not on file    Relationship status: Not on file  Other Topics Concern  . Not on file  Social History Narrative  . Not on file    Allergies:  Allergies  Allergen Reactions  . Aminophylline Anaphylaxis  . Sumatriptan Anaphylaxis and Other (See Comments)    Causes fainting  . Theophylline Anaphylaxis  . Codeine Itching and Other (See Comments)    Too strong for patient   . Depakote [Divalproex Sodium] Other (See Comments)    Hair loss  . Divalproex Sodium Other (See Comments)    Causes hair to fall out  . Doxycycline Other (See Comments)    headache  . Lamictal [Lamotrigine]     Destroys thyroid  . Lithium Other (See Comments)    Adverse reaction to Thyroid   . Magnesium-Containing Compounds Hives  . Metronidazole   . Nabumetone Swelling  . Naproxen Swelling  . Other     Hair dye  . Penicillins Nausea And Vomiting    Has patient had a PCN reaction causing immediate rash, facial/tongue/throat swelling, SOB or lightheadedness with hypotension: Yes Has patient had a PCN reaction causing severe rash involving mucus membranes or skin necrosis: No Has patient had a PCN reaction that required hospitalization Yes Has patient had a PCN reaction occurring within the last 10 years: No If all of the above answers are "NO", then may proceed with Cephalosporin use.   Marland Kitchen. Pentazocine Lactate Other (See Comments)    Unknown reaction  . Risperidone Other (See Comments)    Insomnia   . Septra Ds [Sulfamethoxazole-Trimethoprim]   . Seroquel [Quetiapine] Swelling  . Tegretol [Carbamazepine] Swelling  . Clonidine Derivatives     Dizziness at 0.1 mg   . Tetracycline Rash    Headaches  . Zyprexa [Olanzapine] Rash and Other (See Comments)    Insomnia    Metabolic Disorder Labs: No results found for: HGBA1C, MPG No results found for: PROLACTIN No results found for: CHOL, TRIG, HDL, CHOLHDL, VLDL, LDLCALC Lab Results  Component Value Date   TSH 0.888 07/07/2010    Therapeutic  Level Labs: Lab Results  Component Value Date   LITHIUM 1.27 10/25/2013   No results found for: VALPROATE No components found for:  CBMZ  Current Medications: Current Outpatient Medications  Medication Sig Dispense Refill  . albuterol (PROVENTIL HFA;VENTOLIN HFA) 108 (90 Base) MCG/ACT inhaler Inhale 1-2 puffs into the lungs every 6 (six) hours as needed for wheezing or shortness of breath. 1 Inhaler 0  . aspirin 325 MG tablet Take 325 mg by mouth daily.    . budesonide-formoterol (SYMBICORT) 160-4.5 MCG/ACT inhaler Inhale 2 puffs into the lungs 2 (two) times daily. 1 Inhaler 1  . Calcium Carbonate (CALCIUM 600 PO) Take 1 tablet by mouth 2 (two) times daily.    . ciprofloxacin (CIPRO) 500 MG tablet Take 1 tablet (500 mg total) by mouth 2 (two) times daily. 20 tablet 0  . dicyclomine (BENTYL) 10 MG capsule Take 1 capsule by mouth 3 (three) times daily.    . ferrous sulfate 220 (44 Fe) MG/5ML solution Take 220 mg by mouth 2 (two) times a day.    . fluticasone (CUTIVATE) 0.05 % cream MIX WITH KETOCONAZOLE CREAM AND APPLY TO AFFECTED AREA UP TO TWICE DAILY AS NEEDED. 30 g 0  . gabapentin (NEURONTIN) 300 MG capsule Take 300 mg by mouth 2 (two) times daily.    Marland Kitchen. ketoconazole (NIZORAL) 2 % cream MIX WITH FLUTICASONE CREAM AND APPLY TO AFFECTED AREA UP TO TWICE DAILY AS NEEDED. 30 g 0  . levETIRAcetam (KEPPRA) 750 MG tablet Take 1 tablet by mouth daily.    Marland Kitchen. levothyroxine (SYNTHROID) 50 MCG tablet Take 1 tablet by mouth daily.    Marland Kitchen. loratadine (CLARITIN) 10 MG tablet Take 10 mg by mouth daily.    . metoprolol tartrate (LOPRESSOR) 25 MG tablet Take 1 tablet by mouth  2 (two) times a day.    . metroNIDAZOLE (FLAGYL) 500 MG tablet Take 1 tablet (500 mg total) by mouth 2 (two) times daily. 20 tablet 0  . Multiple Vitamin (MULTIVITAMIN WITH MINERALS) TABS tablet Take 1 tablet by mouth daily.    . naproxen (NAPROSYN) 500 MG tablet Take 500 mg by mouth 2 (two) times daily with a meal.    . oxyCODONE-acetaminophen (PERCOCET/ROXICET) 5-325 MG tablet Take 1 tablet by mouth 3 (three) times daily as needed for pain.    . pantoprazole (PROTONIX) 40 MG tablet Take 1 tablet by mouth daily.    Melene Muller. [START ON 04/26/2019] PARoxetine (PAXIL) 30 MG tablet Take 1 tablet (30 mg total) by mouth daily. 90 tablet 0  . Red Yeast Rice 600 MG CAPS Take 1 capsule by mouth 2 (two) times daily.    . Selenium 200 MCG TABS Take by mouth daily.    . sodium chloride (OCEAN) 0.65 % SOLN nasal spray Place 1 spray into both nostrils as needed for congestion.    . TRELEGY ELLIPTA 100-62.5-25 MCG/INH AEPB     . ziprasidone (GEODON) 60 MG capsule Take 1 capsule by mouth 2 (two) times a day.     No current facility-administered medications for this visit.      Musculoskeletal: Strength & Muscle Tone: N/A Gait & Station: N/A Patient leans: N/A  Psychiatric Specialty Exam: Review of Systems  Psychiatric/Behavioral: Positive for depression. Negative for hallucinations, memory loss, substance abuse and suicidal ideas. The patient is nervous/anxious. The patient does not have insomnia.   All other systems reviewed and are negative.   There were no vitals taken for this visit.There is no height or weight  on file to calculate BMI.  General Appearance: NA  Eye Contact:  NA  Speech:  Clear and Coherent  Volume:  Normal  Mood:  Depressed  Affect:  NA  Thought Process:  Coherent  Orientation:  Full (Time, Place, and Person)  Thought Content: Logical   Suicidal Thoughts:  No  Homicidal Thoughts:  No  Memory:  Immediate;   Good  Judgement:  Good  Insight:  Present  Psychomotor Activity:   Normal  Concentration:  Concentration: Good and Attention Span: Good  Recall:  Good  Fund of Knowledge: Good  Language: Good  Akathisia:  No  Handed:  Right  AIMS (if indicated): not done  Assets:  Communication Skills Desire for Improvement  ADL's:  Intact  Cognition: WNL  Sleep:  Fair   Screenings: PHQ2-9     [facility]2016 in [facility]  PHQ-2 Total Score  6  6  PHQ-9 Total Score  14  14       Assessment and Plan:  Kristen Ryan is a 63 y.o. year old female with a history of bipolar I disorder,PTSD, COPD, IBS, neuropathy , who presents for follow up appointment for PTSD, bipolar disorder.   # Bipolar I disorder, mixed # PTSD # r/o dissociative identity disorder She continues to report depressive symptoms with fatigue since the last visit.  Psychosocial stressors includes loss of her father.  It is also noted that she is found to have iron deficiency anemia according to the patient; she will see new provider for treatment.  It is discussed with the patient to stay on current medication at this time given many of her depressive symptoms can be attributable to anemia.  Will continue Paxil to target depression and anxiety.  We will continue Geodon to target bipolar disorder.  Discussed risk of EPS and QTC prolongation. Discussed behavioral activation. She is advised to contact the clinic for therapy follow up.   Plan I have reviewed and updated plans as below 1. Continue Paxil30 mg daily 2.Continueziprasidone 60 mg twice a day(QTc 450 msec on 03/16/2017) 3. Next appointment: 11/5 at 3:20 for 20 mins, phone - She will fax EKG result to our office - She will contact for therapy appointment if she is interested in a few weeks - TSH checked by her PCP, Dr. Janna Archondiego - on gabapentin 300 mg BID  Past trials of medication: sertraline (weight gain), fluoxetine (SI), Lexapro, Effexor (headache), duloxetine  (rash, headache), mirtazapine (rash), Paxil, Depakote, lithium (thyroid issues), carbamazepine (swelling), lamotrigine (swelling, headache, crying spells), quetiapine (swelling of throat,rash per patient), Geodon, Xanax, clonazepam, clonidine (dizziness), hydroxyzine (limited benefit)  I have reviewed suicide assessment in detail. No change in the following assessment.   The patient demonstrates the following risk factors for suicide: Chronic risk factors for suicide include: psychiatric disorder of bipolar disorderand chronic pain. Acute risk factorsfor suicide include: loss (financial, interpersonal, professional). Protective factorsfor this patient include: positive social support, coping skills and hope for the future. Considering these factors, the overall suicide risk at this point appears to be low. Patient isappropriate for outpatient follow up.  Neysa Hottereina Shaira Sova, MD 03/02/2019, 4:00 PM

## 2019-02-28 ENCOUNTER — Other Ambulatory Visit: Payer: Self-pay | Admitting: Adult Health

## 2019-03-02 ENCOUNTER — Ambulatory Visit (INDEPENDENT_AMBULATORY_CARE_PROVIDER_SITE_OTHER): Payer: Medicare Other | Admitting: Psychiatry

## 2019-03-02 ENCOUNTER — Other Ambulatory Visit: Payer: Self-pay

## 2019-03-02 ENCOUNTER — Encounter (HOSPITAL_COMMUNITY): Payer: Self-pay | Admitting: Psychiatry

## 2019-03-02 DIAGNOSIS — F319 Bipolar disorder, unspecified: Secondary | ICD-10-CM | POA: Diagnosis not present

## 2019-03-02 DIAGNOSIS — F4310 Post-traumatic stress disorder, unspecified: Secondary | ICD-10-CM | POA: Diagnosis not present

## 2019-03-02 MED ORDER — PAROXETINE HCL 30 MG PO TABS: 30.0000 mg | ORAL_TABLET | Freq: Every day | ORAL | 0 refills | Status: DC

## 2019-03-02 NOTE — Patient Instructions (Signed)
1. Continue Paxil30 mg daily 2.Continueziprasidone 60 mg twice a day 3. Next appointment: 11/5 at 3:20  4. Please ask your provider to fax EKG result to our office 5. Consider therapy follow up

## 2019-04-11 ENCOUNTER — Other Ambulatory Visit: Payer: Self-pay | Admitting: Adult Health

## 2019-04-11 ENCOUNTER — Other Ambulatory Visit: Payer: Self-pay | Admitting: Women's Health

## 2019-05-11 ENCOUNTER — Ambulatory Visit (HOSPITAL_COMMUNITY): Payer: Medicare Other | Admitting: Psychiatry

## 2019-05-12 NOTE — Progress Notes (Signed)
Virtual Visit via Telephone Note  I connected with Kristen Ryan on 05/22/19 at  2:00 PM EST by telephone and verified that I am speaking with the correct person using two identifiers.   I discussed the limitations, risks, security and privacy concerns of performing an evaluation and management service by telephone and the availability of in person appointments. I also discussed with the patient that there may be a patient responsible charge related to this service. The patient expressed understanding and agreed to proceed.      I discussed the assessment and treatment plan with the patient. The patient was provided an opportunity to ask questions and all were answered. The patient agreed with the plan and demonstrated an understanding of the instructions.   The patient was advised to call back or seek an in-person evaluation if the symptoms worsen or if the condition fails to improve as anticipated.  I provided 15 minutes of non-face-to-face time during this encounter.   Neysa Hotter, MD    [facility] MD/PA/NP OP Progress Note  05/22/2019 2:23 PM Kristen Ryan  MRN:  [id number]  Chief Complaint:  Chief Complaint    Trauma; Follow-up     HPI:  This is a follow-up appointment for PTSD and bipolar disorder.  She states that she is having worsening in her hip and back pain.  She feels depressed about the situation and has crying spells. She will see a provider in [place] on 12/15. She states that her mood has been good otherwise. Her friend, Lupita Leash continues to stay at home. They get along each other and Tesslyn enjoys having her. She reports "wonderful" relationship with her husband, which she describe as "could not be any better." She enjoys cooking and going out. Although she tends to think about her father (deceased) especially at night, she usually feels better by trying to keep herself busy during the day.  She sleeps 7 to 8 hours.  She has fair energy and motivation.  She has good  appetite.  She denies SI. She feels anxious and tense at times .  She denies panic attacks.  She denies decreased need for sleep.  She reports a day of having racing thoughts, and feels "nobody can catch me." She denies any increased goal directed behavior. She denies nightmares. She has occasional flashback. She denies hypervigilance.   Visit Diagnosis:    ICD-10-CM   1. PTSD (post-traumatic stress disorder)  F43.10   2. Bipolar 1 disorder (HCC)  F31.9     Past Psychiatric History: Please see initial evaluation for full details. I have reviewed the history. No updates at this time.     Past Medical History:  Past Medical History:  Diagnosis Date  . Anemia   . Asthma   . Bipolar 1 disorder (HCC)   . Bronchitis   . COPD (chronic obstructive pulmonary disease) (HCC)   . Diverticulitis   . IBS (irritable bowel syndrome)   . Migraine   . Neuropathy   . Schizophrenia (HCC)   . Thyroid disease     Past Surgical History:  Procedure Laterality Date  . ABDOMINAL HYSTERECTOMY    . broke left arm    . CESAREAN SECTION    . COLONOSCOPY N/A 08/30/2014   Procedure: COLONOSCOPY;  Surgeon: Malissa Hippo, MD;  Location: [facility];  Service: Endoscopy;  Laterality: N/A;  1200  . ELBOW SURGERY    . FOOT SURGERY    . KNEE SURGERY    . TONSILLECTOMY  Family Psychiatric History: Please see initial evaluation for full details. I have reviewed the history. No updates at this time.     Family History:  Family History  Problem Relation Age of Onset  . Dementia Mother   . Bipolar disorder Mother   . Dementia Father   . Other Son        MVA accident    Social History:  Social History   Socioeconomic History  . Marital status: Married    Spouse name: Not on file  . Number of children: Not on file  . Years of education: Not on file  . Highest education level: Not on file  Occupational History  . Not on file  Social Needs  . Financial resource strain: Not on file  . Food  insecurity    Worry: Not on file    Inability: Not on file  . Transportation needs    Medical: Not on file    Non-medical: Not on file  Tobacco Use  . Smoking status: Former Smoker    Packs/day: 4.00    Types: Cigarettes    Quit date: 07/06/2008    Years since quitting: 10.8  . Smokeless tobacco: Never Used  Substance and Sexual Activity  . Alcohol use: No  . Drug use: No  . Sexual activity: Yes    Birth control/protection: Surgical    Comment: hyst  Lifestyle  . Physical activity    Days per week: Not on file    Minutes per session: Not on file  . Stress: Not on file  Relationships  . Social Herbalist on phone: Not on file    Gets together: Not on file    Attends religious service: Not on file    Active member of club or organization: Not on file    Attends meetings of clubs or organizations: Not on file    Relationship status: Not on file  Other Topics Concern  . Not on file  Social History Narrative  . Not on file    Allergies:  Allergies  Allergen Reactions  . Aminophylline Anaphylaxis  . Sumatriptan Anaphylaxis and Other (See Comments)    Causes fainting  . Theophylline Anaphylaxis  . Codeine Itching and Other (See Comments)    Too strong for patient   . Depakote [Divalproex Sodium] Other (See Comments)    Hair loss  . Divalproex Sodium Other (See Comments)    Causes hair to fall out  . Doxycycline Other (See Comments)    headache  . Lamictal [Lamotrigine]     Destroys thyroid  . Lithium Other (See Comments)    Adverse reaction to Thyroid   . Magnesium-Containing Compounds Hives  . Metronidazole   . Nabumetone Swelling  . Naproxen Swelling  . Other     Hair dye  . Penicillins Nausea And Vomiting    Has patient had a PCN reaction causing immediate rash, facial/tongue/throat swelling, SOB or lightheadedness with hypotension: Yes Has patient had a PCN reaction causing severe rash involving mucus membranes or skin necrosis: No Has patient  had a PCN reaction that required hospitalization Yes Has patient had a PCN reaction occurring within the last 10 years: No If all of the above answers are "NO", then may proceed with Cephalosporin use.   Marland Kitchen Pentazocine Lactate Other (See Comments)    Unknown reaction  . Risperidone Other (See Comments)    Insomnia   . Septra Ds [Sulfamethoxazole-Trimethoprim]   . Seroquel [Quetiapine] Swelling  .  Tegretol [Carbamazepine] Swelling  . Clonidine Derivatives     Dizziness at 0.1 mg  . Tetracycline Rash    Headaches  . Zyprexa [Olanzapine] Rash and Other (See Comments)    Insomnia    Metabolic Disorder Labs: No results found for: HGBA1C, MPG No results found for: PROLACTIN No results found for: CHOL, TRIG, HDL, CHOLHDL, VLDL, LDLCALC Lab Results  Component Value Date   TSH 0.888 07/07/2010    Therapeutic Level Labs: Lab Results  Component Value Date   LITHIUM 1.27 10/25/2013   No results found for: VALPROATE No components found for:  CBMZ  Current Medications: Current Outpatient Medications  Medication Sig Dispense Refill  . albuterol (PROVENTIL HFA;VENTOLIN HFA) 108 (90 Base) MCG/ACT inhaler Inhale 1-2 puffs into the lungs every 6 (six) hours as needed for wheezing or shortness of breath. 1 Inhaler 0  . aspirin 325 MG tablet Take 325 mg by mouth daily.    . budesonide-formoterol (SYMBICORT) 160-4.5 MCG/ACT inhaler Inhale 2 puffs into the lungs 2 (two) times daily. 1 Inhaler 1  . Calcium Carbonate (CALCIUM 600 PO) Take 1 tablet by mouth 2 (two) times daily.    . ciprofloxacin (CIPRO) 500 MG tablet Take 1 tablet (500 mg total) by mouth 2 (two) times daily. 20 tablet 0  . dicyclomine (BENTYL) 10 MG capsule Take 1 capsule by mouth 3 (three) times daily.    . ferrous sulfate 220 (44 Fe) MG/5ML solution Take 220 mg by mouth 2 (two) times a day.    . fluticasone (CUTIVATE) 0.05 % cream MIX WITH KETOCONAZOLE CREAM AND APPLY TO AFFECTED AREA UP TO TWICE DAILY AS NEEDED. 30 g 2  .  gabapentin (NEURONTIN) 300 MG capsule Take 300 mg by mouth 2 (two) times daily.    Marland Kitchen. ketoconazole (NIZORAL) 2 % cream MIX WITH FLUTICASONE CREAM AND APPLY TO AFFECTED AREA UP TO TWICE DAILY AS NEEDED. 30 g 0  . levETIRAcetam (KEPPRA) 750 MG tablet Take 1 tablet by mouth daily.    Marland Kitchen. levothyroxine (SYNTHROID) 50 MCG tablet Take 1 tablet by mouth daily.    Marland Kitchen. loratadine (CLARITIN) 10 MG tablet Take 10 mg by mouth daily.    . metoprolol tartrate (LOPRESSOR) 25 MG tablet Take 1 tablet by mouth 2 (two) times a day.    . metroNIDAZOLE (FLAGYL) 500 MG tablet Take 1 tablet (500 mg total) by mouth 2 (two) times daily. 20 tablet 0  . Multiple Vitamin (MULTIVITAMIN WITH MINERALS) TABS tablet Take 1 tablet by mouth daily.    . naproxen (NAPROSYN) 500 MG tablet Take 500 mg by mouth 2 (two) times daily with a meal.    . oxyCODONE-acetaminophen (PERCOCET/ROXICET) 5-325 MG tablet Take 1 tablet by mouth 3 (three) times daily as needed for pain.    . pantoprazole (PROTONIX) 40 MG tablet Take 1 tablet by mouth daily.    Melene Muller. [START ON 07/25/2019] PARoxetine (PAXIL) 30 MG tablet Take 1 tablet (30 mg total) by mouth daily. 90 tablet 0  . Red Yeast Rice 600 MG CAPS Take 1 capsule by mouth 2 (two) times daily.    . Selenium 200 MCG TABS Take by mouth daily.    . sodium chloride (OCEAN) 0.65 % SOLN nasal spray Place 1 spray into both nostrils as needed for congestion.    . TRELEGY ELLIPTA 100-62.5-25 MCG/INH AEPB     . ziprasidone (GEODON) 60 MG capsule Take 1 capsule (60 mg total) by mouth 2 (two) times daily with a meal. 180 capsule  1   No current facility-administered medications for this visit.      Musculoskeletal: Strength & Muscle Tone: N/A Gait & Station: N/A Patient leans: N/A  Psychiatric Specialty Exam: Review of Systems  Psychiatric/Behavioral: Negative for depression, hallucinations, memory loss, substance abuse and suicidal ideas. The patient is nervous/anxious. The patient does not have insomnia.    All other systems reviewed and are negative.   There were no vitals taken for this visit.There is no height or weight on file to calculate BMI.  General Appearance: NA  Eye Contact:  NA  Speech:  Clear and Coherent  Volume:  Normal  Mood:  "better"  Affect:  NA  Thought Process:  Coherent  Orientation:  Full (Time, Place, and Person)  Thought Content: Logical   Suicidal Thoughts:  No  Homicidal Thoughts:  No  Memory:  Immediate;   Good  Judgement:  Good  Insight:  Fair  Psychomotor Activity:  Normal  Concentration:  Concentration: Good and Attention Span: Good  Recall:  Good  Fund of Knowledge: Good  Language: Good  Akathisia:  No  Handed:  Right  AIMS (if indicated): not done  Assets:  Communication Skills Desire for Improvement  ADL's:  Intact  Cognition: WNL  Sleep:  Fair   Screenings: PHQ2-9     Office Visit from 07/28/2016 in [facility] Office Visit from 06/15/2016 in [facility]  PHQ-2 Total Score  6  6  PHQ-9 Total Score  14  14       Assessment and Plan:  Kristen Ryan is a 63 y.o. year old female with a history of bipolar I disorder, PTSD, COPD, IBS, neuropathy , who presents for follow up appointment for PTSD (post-traumatic stress disorder)  Bipolar 1 disorder (HCC)  # Bipolar I disorder, mixed # PTSD # r/o dissociative identity disorder There has been overall improvement in her mood symptoms since the last visit.  Recent psychosocial stressors includes pain. She also has a grief of loss of her father. Will continue Paxil to target PTSD, depression. Will continue Geodon to target bipolar disorder.  Discussed risk of EPS and QTc prolongation. She is advised again to have EKG for monitoring at the next visit with her PCP. Discussed behavioral activation.   Plan I have reviewed and updated plans as below 1.ContinuePaxil30 mg daily 2.Continueziprasidone 60 mg twice a day(QTc 450 msec on 03/16/2017) 3. Next appointment: in three  months  - She will fax EKG result to our office - TSH checked by her PCP, Dr. Janna Arch - on gabapentin 300 mg BID(prescribed by other provider)  Past trials of medication: sertraline (weight gain), fluoxetine (SI), Lexapro, Effexor (headache), duloxetine (rash, headache), mirtazapine (rash), Paxil, Depakote, lithium (thyroid issues), carbamazepine (swelling), lamotrigine (swelling, headache, crying spells), quetiapine (swelling of throat,rash per patient), Geodon, Xanax, clonazepam, clonidine (dizziness), hydroxyzine (limited benefit)   The patient demonstrates the following risk factors for suicide: Chronic risk factors for suicide include: psychiatric disorder of bipolar disorderand chronic pain. Acute risk factorsfor suicide include: loss (financial, interpersonal, professional). Protective factorsfor this patient include: positive social support, coping skills and hope for the future. Considering these factors, the overall suicide risk at this point appears to be low. Patient isappropriate for outpatient follow up.  Neysa Hotter, MD 05/22/2019, 2:23 PM

## 2019-05-15 ENCOUNTER — Other Ambulatory Visit: Payer: Self-pay | Admitting: Women's Health

## 2019-05-15 ENCOUNTER — Other Ambulatory Visit: Payer: Self-pay | Admitting: Adult Health

## 2019-05-17 ENCOUNTER — Other Ambulatory Visit (HOSPITAL_COMMUNITY): Payer: Self-pay | Admitting: Psychiatry

## 2019-05-17 ENCOUNTER — Telehealth (HOSPITAL_COMMUNITY): Payer: Self-pay | Admitting: *Deleted

## 2019-05-17 MED ORDER — ZIPRASIDONE HCL 60 MG PO CAPS: 60.0000 mg | ORAL_CAPSULE | Freq: Two times a day (BID) | ORAL | 1 refills | Status: DC

## 2019-05-17 NOTE — Telephone Encounter (Signed)
Ordered

## 2019-05-17 NOTE — Telephone Encounter (Signed)
Patient has left  X 2 VM that she needs refills on Geodon has been out  X 2 days  & is afraid that id she doesn't get her medication soon that her Schizophrenia symptoms will return

## 2019-05-22 ENCOUNTER — Ambulatory Visit (INDEPENDENT_AMBULATORY_CARE_PROVIDER_SITE_OTHER): Payer: Medicare Other | Admitting: Psychiatry

## 2019-05-22 ENCOUNTER — Other Ambulatory Visit: Payer: Self-pay

## 2019-05-22 ENCOUNTER — Encounter (HOSPITAL_COMMUNITY): Payer: Self-pay | Admitting: Psychiatry

## 2019-05-22 DIAGNOSIS — F4310 Post-traumatic stress disorder, unspecified: Secondary | ICD-10-CM | POA: Diagnosis not present

## 2019-05-22 DIAGNOSIS — F319 Bipolar disorder, unspecified: Secondary | ICD-10-CM

## 2019-05-22 MED ORDER — PAROXETINE HCL 30 MG PO TABS: 30.0000 mg | ORAL_TABLET | Freq: Every day | ORAL | 0 refills | Status: DC

## 2019-05-22 NOTE — Patient Instructions (Signed)
1.ContinuePaxil30 mg daily 2.Continueziprasidone 60 mg twice a day 3. Next appointment: in three months

## 2019-06-07 ENCOUNTER — Telehealth (INDEPENDENT_AMBULATORY_CARE_PROVIDER_SITE_OTHER): Payer: Self-pay | Admitting: Internal Medicine

## 2019-06-07 NOTE — Telephone Encounter (Signed)
Patient call please - ask her to contact her pharmacy and have them to send [place] a refill request. Thank you.

## 2019-06-07 NOTE — Telephone Encounter (Signed)
Patient came into office to schedule a follow up appointment - scheduled for 1/7 - wants to know about getting her refills - please advise - ph# [phone]

## 2019-06-12 ENCOUNTER — Other Ambulatory Visit: Payer: Self-pay | Admitting: Adult Health

## 2019-06-19 ENCOUNTER — Telehealth: Payer: Self-pay | Admitting: *Deleted

## 2019-06-19 ENCOUNTER — Encounter: Payer: Self-pay | Admitting: Student in an Organized Health Care Education/Training Program

## 2019-06-19 NOTE — Telephone Encounter (Signed)
Attempted to call for pre appointment assessment. Message left. 

## 2019-06-19 NOTE — Progress Notes (Signed)
Patient's Name: Kristen Ryan  MRN: [id number]  Referring Provider: Leonel Ramsay, MD  DOB: 12-Feb-1956  PCP: Lucia Gaskins, MD  DOS: 06/20/2019  Note by: Gillis Santa, MD  Service setting: Ambulatory outpatient  Specialty: Interventional Pain Management  Location: [facility] Virtual Visit  Visit type: Initial Patient Evaluation  Patient type: New Patient   Pain Management Virtual Encounter Note - Virtual Visit via [place] (real-time audio visits between healthcare provider and patient).   Patient's Phone No.:  [phone] (home); [phone] (mobile); (Preferred) [phone] No e-mail address on record  [address], [place] [address] Phone: [phone] Fax: [phone]    Pre-screening note:  Our staff contacted [name] and offered her an "in person", "face-to-face" appointment versus a telephone encounter. She indicated preferring the telephone encounter, at this time.  Primary Reason(s) for Visit: Tele-Encounter for initial evaluation of one or more chronic problems (new to examiner) potentially causing chronic pain, and posing a threat to normal musculoskeletal function. (Level of risk: High) CC: low back pain  I contacted Kristen Ryan on 06/20/2019 via video conference.      I clearly identified myself as Gillis Santa, MD. I verified that I was speaking with the correct person using two identifiers (Name: Kristen Ryan, and date of birth: [birth year]).  Advanced Informed Consent I sought verbal advanced consent from Kristen Ryan for virtual visit interactions. I informed [name] of possible security and privacy concerns, risks, and limitations associated with providing "not-in-person" medical evaluation and management services. I also informed [name] of the availability of "in-person" appointments. Finally, I informed her that there would be a charge for the  virtual visit and that she could be  personally, fully or partially, financially responsible for it. [name] expressed understanding and agreed to proceed.   HPI  [name] is a 63 y.o. year old, female patient, contacted today for an initial evaluation of her chronic pain. She has ASTHMA; COLLES' FRACTURE, LEFT WRIST; Midline thoracic back pain; Abnormality of gait; Urinary urgency; Bipolar disorder ([place]); Sigmoid diverticulitis; Hypokalemia; Normocytic anemia; COPD with asthma ([place]); Thyroid activity decreased; Bipolar disorder in partial remission ([place]); COPD (chronic obstructive pulmonary disease) with acute bronchitis ([place]); Acute respiratory failure with hypoxia ([place]); Well woman exam with routine gynecological exam; Bipolar 1 disorder, mixed, moderate ([place]); PTSD (post-traumatic stress disorder); Lumbar spondylosis; Lumbar degenerative disc disease; Cervical facet joint syndrome; and Sacroiliac joint pain on their problem list.  Pain Assessment: Location: Upper, Mid, Lower Back Radiating: denies Onset: More than a month ago Duration: Chronic pain Quality: Aching, Burning, Tingling Severity: 8 /10 (subjective, self-reported pain score)  Effect on ADL:   Timing: Constant Modifying factors: sleep  Onset and Duration: Sudden and Present longer than 3 months Cause of pain: osteoporosis Severity: Getting worse, NAS-11 at its worse: 8/10, NAS-11 at its best: 3/10, NAS-11 now: 8/10 and NAS-11 on the average: 7/10 Timing: Morning, During activity or exercise, After activity or exercise and After a period of immobility Aggravating Factors: Bending, Lifiting and Squatting Alleviating Factors: Sleeping Associated Problems: Numbness, Spasms and Tingling Quality of Pain: Aching, Burning and Tingling Previous Examinations or Tests: MRI scan, Myelogram, X-rays, Neurological evaluation and Chiropractic evaluation Previous Treatments: Narcotic medications, Physical Therapy, Steroid treatments by  mouth and TENS  Patient is a 63 year old female who presents with a chief complaint of diffuse back pain throughout her entire spine most pronounced at  her lower lumbar spine.  She believes that this is due to osteoporosis and osteoarthritis.  She states that the primary reason for her referral is to continue her opioid analgesics, Percocet.  She states she recently transitioned primary care providers from [place] to [employer].  She has done PT in the past. She is not interested in interventional therapies, primarily having her Percocet continued.  She does have a significant psychiatric history + bipolar, schizophrenia, PTSD, depression  Historic Controlled Substance Pharmacotherapy Review   06/07/2019  3   06/07/2019  Oxycodone-Acetaminophen 5-325  90.00  [address]   [id number]   Car ([id number])   0  22.50 MME  Medicare   [place]    Pharmacodynamics: Desired effects: Analgesia: The patient reports <50% benefit. Reported improvement in function: The patient reports medication allows her to accomplish basic ADLs. Clinically meaningful improvement in function (CMIF): Sustained CMIF goals met Perceived effectiveness: Described as relatively effective, allowing for increase in activities of daily living (ADL) Undesirable effects: Side-effects or Adverse reactions: None reported Historical Monitoring: The patient  reports no history of drug use. List of all UDS Test(s): No results found for: MDMA, COCAINSCRNUR, [place], [place], CANNABQUANT, THCU, [place] List of other Serum/Urine Drug Screening Test(s):  No results found for: AMPHSCRSER, BARBSCRSER, BENZOSCRSER, COCAINSCRSER, COCAINSCRNUR, PCPSCRSER, PCPQUANT, THCSCRSER, THCU, CANNABQUANT, OPIATESCRSER, OXYSCRSER, PROPOXSCRSER, ETH Historical Background Evaluation: [place] PMP: PDMP reviewed during this encounter. Six (6) year initial data search conducted.             [place] Department of public safety, offender search: Editor, commissioning Information)  Non-contributory Risk Assessment Profile: Aberrant behavior: None observed or detected today Risk factors for fatal opioid overdose: bipolar disorder, schizophrenia and +psych hx Fatal overdose hazard ratio (HR): Calculation deferred Non-fatal overdose hazard ratio (HR): Calculation deferred Risk of opioid abuse or dependence: 0.7-3.0% with doses ? 36 MME/day and 6.1-26% with doses ? 120 MME/day. Substance use disorder (SUD) risk level: Moderate Personal History of Substance Abuse (SUD-Substance use disorder):  Alcohol: Negative  Illegal Drugs: Negative  Rx Drugs: Negative  ORT Risk Level calculation: Low Risk Opioid Risk Tool - 06/19/19 1307      Family History of Substance Abuse   Alcohol  Negative    Illegal Drugs  Negative    Rx Drugs  Negative      Personal History of Substance Abuse   Alcohol  Negative    Illegal Drugs  Negative    Rx Drugs  Negative      Age   Age between 7-45 years   No      History of Preadolescent Sexual Abuse   History of Preadolescent Sexual Abuse  Negative or Female      Psychological Disease   Psychological Disease  Positive    ADD  Negative    OCD  Negative    Bipolar  Positive    Schizophrenia  Positive    Depression  Negative      Total Score   Opioid Risk Tool Scoring  2    Opioid Risk Interpretation  Low Risk      ORT Scoring interpretation table:  Score <3 = Low Risk for SUD  Score between 4-7 = Moderate Risk for SUD  Score >8 = High Risk for Opioid Abuse   Pharmacologic Plan: Non-opioid analgesic therapy offered.            Initial impression: Poor candidate for opioid analgesics.  Meds   Current Outpatient Medications:  .  albuterol (PROVENTIL HFA;VENTOLIN  HFA) 108 (90 Base) MCG/ACT inhaler, Inhale 1-2 puffs into the lungs every 6 (six) hours as needed for wheezing or shortness of breath., Disp: 1 Inhaler, Rfl: 0 .  aspirin 325 MG tablet, Take 325 mg by mouth daily., Disp: , Rfl:  .  budesonide (PULMICORT) 1 MG/2ML  nebulizer solution, Inhale into the lungs., Disp: , Rfl:  .  dicyclomine (BENTYL) 10 MG capsule, Take 1 capsule by mouth 3 (three) times daily., Disp: , Rfl:  .  Dupilumab (DUPIXENT) 300 MG/2ML SOPN, Inject into the skin., Disp: , Rfl:  .  ferrous sulfate 220 (44 Fe) MG/5ML solution, Take 220 mg by mouth 2 (two) times a day., Disp: , Rfl:  .  fexofenadine (ALLEGRA) 180 MG tablet, Take 180 mg by mouth daily., Disp: , Rfl:  .  gabapentin (NEURONTIN) 300 MG capsule, Take 300 mg by mouth 2 (two) times daily., Disp: , Rfl:  .  ipratropium-albuterol (DUONEB) 0.5-2.5 (3) MG/3ML SOLN, Inhale into the lungs., Disp: , Rfl:  .  levETIRAcetam (KEPPRA) 750 MG tablet, Take 1 tablet by mouth daily., Disp: , Rfl:  .  levothyroxine (SYNTHROID) 50 MCG tablet, Take 1 tablet by mouth daily., Disp: , Rfl:  .  metoprolol tartrate (LOPRESSOR) 25 MG tablet, Take 1 tablet by mouth 2 (two) times a day., Disp: , Rfl:  .  oxyCODONE-acetaminophen (PERCOCET/ROXICET) 5-325 MG tablet, Take 1 tablet by mouth 3 (three) times daily as needed for pain., Disp: , Rfl:  .  [START ON 07/25/2019] PARoxetine (PAXIL) 30 MG tablet, Take 1 tablet (30 mg total) by mouth daily., Disp: 90 tablet, Rfl: 0 .  simvastatin (ZOCOR) 40 MG tablet, Take by mouth., Disp: , Rfl:  .  sodium chloride (OCEAN) 0.65 % SOLN nasal spray, Place 1 spray into both nostrils as needed for congestion., Disp: , Rfl:  .  TRELEGY ELLIPTA 100-62.5-25 MCG/INH AEPB, , Disp: , Rfl:  .  ziprasidone (GEODON) 60 MG capsule, Take 1 capsule (60 mg total) by mouth 2 (two) times daily with a meal., Disp: 180 capsule, Rfl: 1 .  budesonide-formoterol (SYMBICORT) 160-4.5 MCG/ACT inhaler, Inhale 2 puffs into the lungs 2 (two) times daily. (Patient not taking: Reported on 06/19/2019), Disp: 1 Inhaler, Rfl: 1 .  Calcium Carbonate (CALCIUM 600 PO), Take 1 tablet by mouth 2 (two) times daily., Disp: , Rfl:  .  Cholecalciferol (VITAMIN D-1000 MAX ST) 25 MCG (1000 UT) tablet, Take by mouth.,  Disp: , Rfl:  .  ciprofloxacin (CIPRO) 500 MG tablet, Take 1 tablet (500 mg total) by mouth 2 (two) times daily. (Patient not taking: Reported on 06/19/2019), Disp: 20 tablet, Rfl: 0 .  diclofenac sodium (VOLTAREN) 1 % GEL, SMARTSIG:2-4 Gram(s) Topical Daily, Disp: , Rfl:  .  EPINEPHrine 0.3 mg/0.3 mL IJ SOAJ injection, INJECT (0.3)ML INTO THEUMUSCLE ONCE AS NEEDED FORNANAPHYLAXIS FOR ONE DOSE., Disp: , Rfl:  .  fluticasone (CUTIVATE) 0.05 % cream, MIX WITH KETOCONAZOLE CREAM AND APPLY TO AFFECTED AREA UP TO TWICE DAILY AS NEEDED. (Patient not taking: Reported on 06/19/2019), Disp: 30 g, Rfl: 2 .  ketoconazole (NIZORAL) 2 % cream, MIX WITH FLUTICASONE CREAM AND APPLY TO AFFECTED AREA UP TO TWICE DAILY AS NEEDED. (Patient not taking: Reported on 06/19/2019), Disp: 30 g, Rfl: 0 .  loratadine (CLARITIN) 10 MG tablet, Take 10 mg by mouth daily., Disp: , Rfl:  .  Magnesium 250 MG TABS, Take by mouth., Disp: , Rfl:  .  metroNIDAZOLE (FLAGYL) 500 MG tablet, Take 1 tablet (500 mg  total) by mouth 2 (two) times daily. (Patient not taking: Reported on 06/19/2019), Disp: 20 tablet, Rfl: 0 .  Multiple Vitamin (MULTIVITAMIN WITH MINERALS) TABS tablet, Take 1 tablet by mouth daily., Disp: , Rfl:  .  naproxen (NAPROSYN) 500 MG tablet, Take 500 mg by mouth 2 (two) times daily with a meal., Disp: , Rfl:  .  pantoprazole (PROTONIX) 40 MG tablet, Take 1 tablet by mouth daily., Disp: , Rfl:  .  Red Yeast Rice 600 MG CAPS, Take 1 capsule by mouth 2 (two) times daily., Disp: , Rfl:  .  Selenium 200 MCG TABS, Take by mouth daily., Disp: , Rfl:   ROS  Cardiovascular: Daily Aspirin intake Pulmonary or Respiratory: Difficulty blowing air out (Emphysema) Neurological: Seizure disorder and Abnormal skin sensations (Peripheral Neuropathy) Review of Past Neurological Studies:  Results for orders placed or performed during the hospital encounter of 04/25/18  CT HEAD WO CONTRAST   Narrative   CLINICAL DATA:  Visual loss,  posterior headache for 10 days, LEFT-sided visual loss that is slowly coming back, quality at this time, history asthma, COPD, former smoker  EXAM: CT HEAD WITHOUT CONTRAST  TECHNIQUE: Contiguous axial images were obtained from the base of the skull through the vertex without intravenous contrast. Sagittal and coronal MPR images reconstructed from axial data set.  COMPARISON:  None  FINDINGS: Brain: Normal ventricular morphology. No midline shift or mass effect. Normal appearance of brain parenchyma. No intracranial hemorrhage, mass lesion, evidence of acute infarction, or extra-axial fluid collection.  Vascular: No hyperdense vessels  Skull: Intact  Sinuses/Orbits: Minimal dependent fluid or mucus in LEFT maxillary sinus. Otherwise clear.  Other: N/A  IMPRESSION: No acute intracranial abnormalities.  Minimal LEFT maxillary sinus fluid or mucus.   Electronically Signed   By: Lavonia Dana M.D.   On: 04/25/2018 17:55    Psychological-Psychiatric: Psychiatric disorder Gastrointestinal: Reflux or heatburn Genitourinary: No reported renal or genitourinary signs or symptoms such as difficulty voiding or producing urine, peeing blood, non-functioning kidney, kidney stones, difficulty emptying the bladder, difficulty controlling the flow of urine, or chronic kidney disease Hematological: Weakness due to low blood hemoglobin or red blood cell count (Anemia) Endocrine: Slow thyroid Rheumatologic: Joint aches and or swelling due to excess weight (Osteoarthritis) Musculoskeletal: Negative for myasthenia gravis, muscular dystrophy, multiple sclerosis or malignant hyperthermia Work History: Retired  Allergies  Ms. Salamon is allergic to aminophylline; sumatriptan; theophylline; codeine; depakote [divalproex sodium]; divalproex sodium; doxycycline; lamictal [lamotrigine]; lithium; magnesium-containing compounds; metronidazole; nabumetone; naproxen; other; penicillins; pentazocine  lactate; risperidone; septra ds [sulfamethoxazole-trimethoprim]; seroquel [quetiapine]; tegretol [carbamazepine]; clonidine derivatives; tetracycline; and zyprexa [olanzapine].  Laboratory Chemistry Profile   Screening No results found for: SARSCOV2NAA, COVIDSOURCE, STAPHAUREUS, MRSAPCR, HCVAB, HIV, PREGTESTUR  Inflammation (CRP: Acute Phase) (ESR: Chronic Phase) Lab Results  Component Value Date   LATICACIDVEN 1.8 08/03/2015                         Rheumatology No results found for: RF, ANA, LABURIC, URICUR, LYMEIGGIGMAB, LYMEABIGMQN, HLAB27                      Renal Lab Results  Component Value Date   BUN 12 12/31/2018   CREATININE 0.83 12/31/2018   GFRAA >60 12/31/2018   GFRNONAA >60 12/31/2018                             Hepatic Lab Results  Component Value Date   AST 13 (L) 12/31/2018   ALT 10 12/31/2018   ALBUMIN 3.1 (L) 12/31/2018   ALKPHOS 69 12/31/2018   LIPASE 30 12/31/2018                        Electrolytes Lab Results  Component Value Date   NA 139 12/31/2018   K 3.8 12/31/2018   CL 103 12/31/2018   CALCIUM 8.6 (L) 12/31/2018   MG 1.9 08/03/2015                         Coagulation Lab Results  Component Value Date   INR 1.06 08/03/2015   LABPROT 14.0 08/03/2015   APTT 32 08/03/2015   PLT 278 12/31/2018   DDIMER 1.05 (H) 04/03/2014                        Cardiovascular Lab Results  Component Value Date   CKTOTAL 210 (H) 10/25/2013   CKMB 1.7 10/25/2013   TROPONINI <0.03 11/06/2014   HGB 8.2 (L) 12/31/2018   HCT 30.1 (L) 12/31/2018                         ID No results found for: LYMEIGGIGMAB, HIV, [place], STAPHAUREUS, MRSAPCR, HCVAB, PREGTESTUR, MICROTEXT  Cancer No results found for: CEA, CA125, LABCA2                      Endocrine Lab Results  Component Value Date   TSH 0.888 07/07/2010   FREET4 1.27 07/07/2010                        Note: Lab results reviewed.  Imaging Review  Cervical Imaging: Cervical MR wo  contrast:  Results for orders placed in visit on 11/02/02  MR Cervical Spine Wo Contrast   Narrative FINDINGS CLINICAL DATA:  LEFT ARM PAIN, NUMBNESS AND WEAKNESS. CERVICAL SPINE MRI WITHOUT CONTRAST MEDIA COMPARISON 11/26/00.  DISK DEGENERATION IS AGAIN DEMONSTRATED THROUGHOUT THE CERVICAL AND UPPER THORACIC SPINE. C2-3:  STABLE SMALL TO MODERATE SIZED BROAD-BASED CENTRAL DISK HERNIATION AND ASSOCIATED SPUR FORMATION WITHOUT NEURAL COMPRESSION. C3-4:  INTERVAL SMALL BROAD-BASED CENTRAL DISK HERNIATION AND ASSOCIATED SPUR FORMATION WITHOUT NEURAL COMPRESSION. C4-5:  STABLE SMALL TO MODERATE SIZED CENTRAL DISK HERNIATION AND ASSOCIATED SPUR FORMATION WITHOUT NEURAL COMPRESSION. C5-6:  MILD DIFFUSE DISK BULGING AND SPUR FORMATION WITH MILD PROGRESSION AS WELL AS PROGRESSIVE BILATERAL FACET HYPERTROPHY.  THESE CHANGES ARE PRODUCING MILD TO MODERATE FORAMINAL STENOSIS ON THE LEFT AND MILD FORAMINAL STENOSIS ON THE RIGHT. C6-7:  ON THE AXIAL IMAGE #19, THERE IS PROBABLE VOLUME AVERAGING WITH THE PEDICLE SIMULATING A DISK HERNIATION AND SPUR FORMATION CAUSING MARKED NARROWING OF THE SUPERIOR ASPECT OF THE NEURAL FORAMEN ON THE RIGHT.  NONE OF THESE FINDINGS ARE SEEN ON THE SAGITTAL IMAGES.  ON THE SAGITTAL IMAGES, THERE IS A SUGGESTION OF A MODERATE SIZED DISK HERNIATION AND MILD ASSOCIATED SPUR FORMATION IN THE UNCINATE REGION ON THE LEFT AT THAT LEVEL.  HOWEVER, THIS IS NOT CONFIRMED ON THE AXIAL IMAGES.  THE DISCREPANCY MAY BE DUE TO VOLUME AVERAGING SIMULATING A DISK HERNIATION ON THE SAGITTAL IMAGES. C7-T1:  UNREMARKABLE. T1-2:  UNREMARKABLE. THE CRANIOCERVICAL JUNCTION AND SPINAL CORD HAVE NORMAL APPEARANCES.  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 IMPRESSION DEGENERATIVE CHANGES AND MULTIPLE DISK HERNIATIONS AS DESCRIBED ABOVE.  THERE  ARE NO DEFINITE FINDINGS TO EXPLAIN THE PATIENT'S LEFT ARM SYMPTOMS.   Cervical CT w contrast:  Results for orders placed during the hospital  encounter of 04/08/16  CT CERVICAL SPINE W CONTRAST   Narrative CLINICAL DATA:  Severe total spine pain. Numbness in the lower extremities.  FLUOROSCOPY TIME:  1 minutes 12 seconds. 175.25 micro gray meter squared  PROCEDURE: LUMBAR PUNCTURE FOR CERVICAL LUMBAR AND THORACIC MYELOGRAM  CERVICAL AND LUMBAR AND THORACIC MYELOGRAM  CT CERVICAL MYELOGRAM  CT LUMBAR MYELOGRAM  CT THORACIC MYELOGRAM  After thorough discussion of risks and benefits of the procedure including bleeding, infection, injury to nerves, blood vessels, adjacent structures as well as headache and CSF leak, written and oral informed consent was obtained. Consent was obtained by Dr. Nelson Chimes.  Patient was positioned prone on the fluoroscopy table. Local anesthesia was provided with 1% lidocaine without epinephrine after prepped and draped in the usual sterile fashion. Puncture was performed at L3-4 using a 3 1/2 inch 22-gauge spinal needle via left para median approach. Using a single pass through the dura, the needle was placed within the thecal sac, with return of clear CSF. 10 mL Isovue-300 was injected into the thecal sac, with normal opacification of the nerve roots and cauda equina consistent with free flow within the subarachnoid space. The patient was then moved to the trendelenburg position and contrast flowed into the Thoracic and Cervical spine regions.  I personally performed the lumbar puncture and administered the intrathecal contrast. I also personally performed acquisition of the myelogram images.  TECHNIQUE: Contiguous axial images were obtained through the Cervical, Thoracic, and Lumbar spine after the intrathecal infusion of infusion. Coronal and sagittal reconstructions were obtained of the axial image sets.  FINDINGS: CERVICAL, thoracic AND LUMBAR MYELOGRAM FINDINGS:  Lumbar region shows wide patency of the canal without extradural defect or nerve compression. Flexion extension  does not show any abnormal motion. Thoracic region is widely patent without evidence of stenosis or delay of contrast passage. Cervical region shows no stenosis or nerve root compression. No abnormal motion with flexion and extension.  CT CERVICAL MYELOGRAM FINDINGS:  Alignment is normal. There are minimal, non-compressive disc bulges at C3-4 and C4-5. There is minimal uncovertebral prominence at C5-6. Patient does not show significant narrowing of the canal or foramina at any level. Cord shadow is normal. No neck soft tissue lesion is seen.  CT LUMBAR MYELOGRAM FINDINGS:  There is no abnormality at L3-4 or above. The discs are normal. The canal and foramina are widely patent. The distal cord and conus are normal with the conus tip at L1.  At L4-5, the disc bulges mildly, more towards the right. There is bilateral facet osteoarthritis. No central canal stenosis. Mild foraminal narrowing on the right without visible neural compression.  At L5-S1, there is a disc bulge with a shallow left posterior lateral herniation showing caudal migration behind S1. This approaches the left S1 nerve root but does not cause neural compression. There is mild facet degeneration at this level.  Sacroiliac joints appear normal.  CT THORACIC MYELOGRAM FINDINGS:  Mild curvature convex to the right. Old compression deformities are seen affecting T7, in the superior endplate of T9. No significant loss of height or retropulsion. The spinal canal is widely patent throughout the region. The thoracic spinal cord appears normal. There are minimal, non-compressive disc bulges from T7-8 through T10-11. There is ordinary facet osteoarthritis in the thoracic region which could contribute to back pain. Chronic endplate Schmorl's nodes  are incidentally noted. Paravertebral soft tissues are unremarkable. Visualized lung parenchyma is normal.  IMPRESSION: Lumbar region: Shallow disc herniation centrally and  towards the left at L5-S1 with caudal migration behind S1 which approaches the left S1 nerve root but does not appear to cause neural compression.  Disc bulge at L4-5 more prominent in the right foraminal region. Bilateral facet osteoarthritis. No apparent neural compression. The findings could contribute to low back pain.  Thoracic region: Old compression deformities at T7 and T9. Chronic endplate Schmorl's nodes. Minimal, non-compressive disc bulges from T7-8 through T10-11. Ordinary mild thoracic facet arthropathy. These findings could contribute to back pain but there is no neural compressive pathology.  Cervical region: Minimal, non-compressive disc bulges at C3-4 and C4-5. Minimal uncovertebral hypertrophy at C5-6. No stenosis or neural compression. No facet arthropathy.   Electronically Signed   By: Nelson Chimes M.D.   On: 04/08/2016 13:59     Cervical DG complete:  Results for orders placed in visit on 11/26/00  DG Cervical Spine Complete   Narrative FINDINGS CLINICAL DATA:    MIGRAINE HEADACHES WITH TINGLING IN BOTH HANDS INTERMITTENTLY FOR THREE MONTHS. NO KNOWN INJURY. CERVICAL SPINE 4 VIEWS: COMPARISON 03/29/98. THE PREVERTEBRAL SOFT TISSUES ARE NORMAL.  THE ALIGNMENT IS ANATOMIC THROUGH T1.  THERE IS MINIMAL UNCOVERTEBRAL RIDGING AT THE C5-6 AND C6-7 LEVELS AND THIS DOES RESULT IN MILD NEURAL FORAMINAL NARROWING BILATERALLY, NOT OF THE DEGREE EXPECTED TO CAUSE NERVE ROOT ENCROACHMENT. IMPRESSION: MINIMAL DEGENERATIVE CHANGES AT C5-6 AND C6-7. MRI CERVICAL SPINE WITHOUT CONTRAST: COMPARISON MRI 06/04/95 (NO REPORT IS AVAILABLE FOR THE PRIOR STUDY). THE ALIGNMENT OF THE CERVICAL SPINE IS ANATOMIC.  SAGITTAL IMAGES ARE DEGRADED BY SWALLOWING ARTIFACT.  INVERSION RECOVERY IMAGES WERE OBTAINED IN SAGITTAL PLANE AND DEMONSTRATE A NORMAL APPEARING CERVICAL CORD AND NO MARROW SIGNAL ABNORMALITY.  THE CRANIOCERVICAL JUNCTION APPEARS NORMAL.  IMAGES THROUGH THE INDIVIDUAL  DISC SPACE LEVELS DEMONSTRATE NO FINDINGS OF SIGNIFICANCE AT OR ABOVE C3-4. C4-5:  SMALL CENTRAL DISC PROTRUSION WITHOUT MASS EFFECT ON THE CORD.  NO FORAMINAL STENOSIS. C5-6:  THERE IS UNCOVERTEBRAL RIDGING ECCENTRIC TO THE LEFT WITH A PROBABLE SMALL LEFT FORAMINAL DISC PROTRUSION.  THIS DOES MILDLY NARROW THE FORAMEN AND COULD ENCROACH ON THE LEFT C6 NERVE ROOT.  THERE IS NO CENTRAL STENOSIS OR RIGHT-SIDED FORAMINAL STENOSIS. C6-7:  MILD UNCOVERTEBRAL RIDGING WITHOUT CENTRAL OR FORAMINAL STENOSIS. C7-T1:  NORMAL INTERSPACE. IMPRESSION 1.  SMALL CENTRAL DISC PROTRUSION AT C4-5 WITHOUT ASSOCIATED MASS EFFECT. 2.  SMALL LEFT FORAMINAL DISC PROTRUSION AT C5-6 ASSOCIATED WITH UNCOVERTEBRAL RIDGING AND MILD LEFT-SIDED FORAMINAL NARROWING.  THIS COULD ENCROACH ON THE LEFT C6 NERVE ROOT.  CORRELATE CLINICALLY. 3.  MILD UNCOVERTEBRAL RIDGING BILATERALLY AT C6-7 WITHOUT ASSOCIATED MASS EFFECT. 4.  NO LARGE DISC HERNIATION OR CENTRAL STENOSIS AT ANY LEVEL.   Results for orders placed in visit on 06/13/02  DG Shoulder Left   Narrative FINDINGS CLINICAL DATA:  LEFT SHOULDER PAIN. LEFT SHOULDER 3 VIEWS THE AC JOINT IS INTACT AND THE SUBACROMIAL SPACE IS MAINTAINED.  THERE IS NO EVIDENCE OF FRACTURE OR DISLOCATION.  THE LEFT LUNG APEX IS CLEAR. IMPRESSION NORMAL LEFT SHOULDER.    Thoracic Imaging: Thoracic MR wo contrast:  Results for orders placed during the hospital encounter of 08/31/14  MR Thoracic Spine Wo Contrast   Narrative   [facility] [address] [place], [place] [address] [phone]  NEUROIMAGING REPORT   STUDY DATE: 08/31/2014 PATIENT NAME: KELLIN FIFER DOB: 1956/04/08 MRN: [id number]  ORDERING  CLINICIAN: Dr Krista Blue CLINICAL HISTORY: 4 year patient with midthoraxic pain COMPARISON FILMS: none EXAM: MRI thoraxic Spine wo TECHNIQUE: MRI of the thoracic spine was obtained utilizing 3 mm sagittal  slices from G3-8 down to the L1-2 level with  T1, T2 and inversion recovery  views. In addition 4 mm axial slices from V5-I4 down to T12-L1 level were  included with T1, T2 and gradient echo views.  Study slightly suboptimal  due to motion artefacts CONTRAST: none IMAGING SITE: [facility]  FINDINGS:  On sagittal views the vertebral bodies have normal height and alignment  except for mild loss of anterior height of T 6 vertebra.There are disc  signal loss and mild degenerative changes from .T 5-7 and minor endplate  degenerative changes at t 7-8 and T 8-9.  The spinal cord is normal in  size and appearance. The paraspinal soft tissues are unremarkable.  On  axial views there is no spinal stenosis or foraminal narrowing.  Limited  views of the aorta, kidneys, liver, lungs and paraspinal muscles are  unremarkable.       Impression  Slightly abnormal MRI Thoraxic spine with mild disc and end  plate degenerative changes from T 5-8 but without significant compression.    INTERPRETING PHYSICIAN:  Antony Contras, MD Certified in  Neuroimaging by [place] [facility] and [facility]     T Thoracic CT w contrast:  Results for orders placed during the hospital encounter of 04/08/16  CT THORACIC SPINE W CONTRAST   Narrative CLINICAL DATA:  Severe total spine pain. Numbness in the lower extremities.  FLUOROSCOPY TIME:  1 minutes 12 seconds. 175.25 micro gray meter squared  PROCEDURE: LUMBAR PUNCTURE FOR CERVICAL LUMBAR AND THORACIC MYELOGRAM  CERVICAL AND LUMBAR AND THORACIC MYELOGRAM  CT CERVICAL MYELOGRAM  CT LUMBAR MYELOGRAM  CT THORACIC MYELOGRAM  After thorough discussion of risks and benefits of the procedure including bleeding, infection, injury to nerves, blood vessels, adjacent structures as well as headache and CSF leak, written and oral informed consent was obtained. Consent was obtained by Dr. Nelson Chimes.  Patient was positioned prone on the  fluoroscopy table. Local anesthesia was provided with 1% lidocaine without epinephrine after prepped and draped in the usual sterile fashion. Puncture was performed at L3-4 using a 3 1/2 inch 22-gauge spinal needle via left para median approach. Using a single pass through the dura, the needle was placed within the thecal sac, with return of clear CSF. 10 mL Isovue-300 was injected into the thecal sac, with normal opacification of the nerve roots and cauda equina consistent with free flow within the subarachnoid space. The patient was then moved to the trendelenburg position and contrast flowed into the Thoracic and Cervical spine regions.  I personally performed the lumbar puncture and administered the intrathecal contrast. I also personally performed acquisition of the myelogram images.  TECHNIQUE: Contiguous axial images were obtained through the Cervical, Thoracic, and Lumbar spine after the intrathecal infusion of infusion. Coronal and sagittal reconstructions were obtained of the axial image sets.  FINDINGS: CERVICAL, thoracic AND LUMBAR MYELOGRAM FINDINGS:  Lumbar region shows wide patency of the canal without extradural defect or nerve compression. Flexion extension does not show any abnormal motion. Thoracic region is widely patent without evidence of stenosis or delay of contrast passage. Cervical region shows no stenosis or nerve root compression. No abnormal motion with flexion and extension.  CT CERVICAL MYELOGRAM FINDINGS:  Alignment is normal. There are minimal, non-compressive disc  bulges at C3-4 and C4-5. There is minimal uncovertebral prominence at C5-6. Patient does not show significant narrowing of the canal or foramina at any level. Cord shadow is normal. No neck soft tissue lesion is seen.  CT LUMBAR MYELOGRAM FINDINGS:  There is no abnormality at L3-4 or above. The discs are normal. The canal and foramina are widely patent. The distal cord and conus  are normal with the conus tip at L1.  At L4-5, the disc bulges mildly, more towards the right. There is bilateral facet osteoarthritis. No central canal stenosis. Mild foraminal narrowing on the right without visible neural compression.  At L5-S1, there is a disc bulge with a shallow left posterior lateral herniation showing caudal migration behind S1. This approaches the left S1 nerve root but does not cause neural compression. There is mild facet degeneration at this level.  Sacroiliac joints appear normal.  CT THORACIC MYELOGRAM FINDINGS:  Mild curvature convex to the right. Old compression deformities are seen affecting T7, in the superior endplate of T9. No significant loss of height or retropulsion. The spinal canal is widely patent throughout the region. The thoracic spinal cord appears normal. There are minimal, non-compressive disc bulges from T7-8 through T10-11. There is ordinary facet osteoarthritis in the thoracic region which could contribute to back pain. Chronic endplate Schmorl's nodes are incidentally noted. Paravertebral soft tissues are unremarkable. Visualized lung parenchyma is normal.  IMPRESSION: Lumbar region: Shallow disc herniation centrally and towards the left at L5-S1 with caudal migration behind S1 which approaches the left S1 nerve root but does not appear to cause neural compression.  Disc bulge at L4-5 more prominent in the right foraminal region. Bilateral facet osteoarthritis. No apparent neural compression. The findings could contribute to low back pain.  Thoracic region: Old compression deformities at T7 and T9. Chronic endplate Schmorl's nodes. Minimal, non-compressive disc bulges from T7-8 through T10-11. Ordinary mild thoracic facet arthropathy. These findings could contribute to back pain but there is no neural compressive pathology.  Cervical region: Minimal, non-compressive disc bulges at C3-4 and C4-5. Minimal uncovertebral  hypertrophy at C5-6. No stenosis or neural compression. No facet arthropathy.   Electronically Signed   By: Nelson Chimes M.D.   On: 04/08/2016 13:59    Thoracic DG 2-3 views:  Results for orders placed during the hospital encounter of 04/05/15  DG Thoracic Spine 2 View   Narrative CLINICAL DATA:  Mid and low back pain for 2 days  EXAM: THORACIC SPINE 2 VIEWS  COMPARISON:  Chest x-ray Nov 06, 2014  FINDINGS: There is no evidence of thoracic spine fracture. There is scoliosis of spine. There is chronic compression deformity of mid thoracic vertebral body unchanged compared to prior exam.  IMPRESSION: No acute fracture or dislocation.   Electronically Signed   By: Abelardo Diesel M.D.   On: 04/05/2015 21:47    Thoracic DG 4 views: No results found for this or any previous visit. Thoracic DG: No results found for this or any previous visit. Thoracic DG w/swimmers view:  Results for orders placed during the hospital encounter of 09/05/13  DG Thoracic Spine W/Swimmers   Narrative CLINICAL DATA:  Pain  EXAM: THORACIC SPINE - 2 VIEW + SWIMMERS  COMPARISON:  07/29/2012  FINDINGS: Stable T7 compression fracture deformity with less than 50% loss of height anteriorly. No new fracture. Small endplate spurs in the lower thoracic spine.  IMPRESSION: 1. Old T7 compression fracture deformity.  No acute abnormality.   Electronically Signed  By: Arne Cleveland M.D.   On: 09/05/2013 16:26     Lumbosacral Imaging: Lumbar MR wo contrast:  Results for orders placed in visit on 06/03/01  MR Lumbar Spine Wo Contrast   Narrative FINDINGS CLINICAL DATA:  LOW BACK PAIN AND BILATERAL LEG PARESTHESIAS, INCLUDING NUMBNESS. MRI LUMBAR SPINE WITHOUT CONTRAST COMPARISON 08/06/99.  THE PATIENT WAS PREVIOUSLY SHOWN TO HAVE FIVE NON-RIB BEARING LUMBAR VERTEBRAE.  THE LAST OPEN DISK SPACE IS AGAIN LABELED THE L5-S1 LEVEL.  DISK DEGENERATION IS AGAIN DEMONSTRATED THROUGHOUT THE LUMBAR  SPINE.  THERE HAS BEEN INTERVAL DECREASE IN SIGNAL INTENSITY WITHIN THE PREVIOUSLY DEMONSTRATED SMALL POSTERIOR DISK HERNIATION ON THE LEFT AT THE L5-S1 LEVEL. THE DECREASE IN SIGNAL INTENSITY IS COMPATIBLE WITH INTERVAL DESICCATION OR CALCIFICATION OF THE HERNIATED DISK MATERIAL.  THERE HAS BEEN A SLIGHT INCREASE IN AMOUNT OF HERNIATED DISK MATERIAL AT THAT LOCATION.  THIS IS CURRENTLY IN CONTACT WITH THE ANTERIOR ASPECT OF THE LEFT S1 NERVE ROOT SHEATH AND CAUSING MINIMAL POSTERIOR DISPLACEMENT OF THE NERVE ROOT SHEATH AND MILD FLATTENING OF THE SHEATH.  NO OTHER DISK HERNIATIONS ARE SEEN.  THE SPINAL CANAL AND NEURAL FORAMINA ARE NORMAL IN CALIBER AT ALL LEVELS. NO SIGNIFICANT FACET HYPERTROPHY IS DEMONSTRATED.  THE CONUS MEDULLARIS CONTINUES TO HAVE A NORMAL APPEARANCE WITH ITS TIP AT THE T12-L1 LEVEL. IMPRESSION 1.  SLIGHT INTERVAL INCREASE IN SIZE OF A SMALL DISK HERNIATION ON THE LEFT AT THE L5-S1 LEVEL. THIS HAS MILD MASS EFFECT ON THE PROXIMAL LEFT S1 NERVE ROOT SHEATH AS DESCRIBED ABOVE. 2.  NO FINDINGS TO EXPLAIN THE PATIENT'S BILATERAL LEG SYMPTOMS.   Lumbar CT w contrast:  Results for orders placed during the hospital encounter of 04/08/16  CT LUMBAR SPINE W CONTRAST   Narrative CLINICAL DATA:  Severe total spine pain. Numbness in the lower extremities.  FLUOROSCOPY TIME:  1 minutes 12 seconds. 175.25 micro gray meter squared  PROCEDURE: LUMBAR PUNCTURE FOR CERVICAL LUMBAR AND THORACIC MYELOGRAM  CERVICAL AND LUMBAR AND THORACIC MYELOGRAM  CT CERVICAL MYELOGRAM  CT LUMBAR MYELOGRAM  CT THORACIC MYELOGRAM  After thorough discussion of risks and benefits of the procedure including bleeding, infection, injury to nerves, blood vessels, adjacent structures as well as headache and CSF leak, written and oral informed consent was obtained. Consent was obtained by Dr. Nelson Chimes.  Patient was positioned prone on the fluoroscopy table. Local anesthesia was provided with  1% lidocaine without epinephrine after prepped and draped in the usual sterile fashion. Puncture was performed at L3-4 using a 3 1/2 inch 22-gauge spinal needle via left para median approach. Using a single pass through the dura, the needle was placed within the thecal sac, with return of clear CSF. 10 mL Isovue-300 was injected into the thecal sac, with normal opacification of the nerve roots and cauda equina consistent with free flow within the subarachnoid space. The patient was then moved to the trendelenburg position and contrast flowed into the Thoracic and Cervical spine regions.  I personally performed the lumbar puncture and administered the intrathecal contrast. I also personally performed acquisition of the myelogram images.  TECHNIQUE: Contiguous axial images were obtained through the Cervical, Thoracic, and Lumbar spine after the intrathecal infusion of infusion. Coronal and sagittal reconstructions were obtained of the axial image sets.  FINDINGS: CERVICAL, thoracic AND LUMBAR MYELOGRAM FINDINGS:  Lumbar region shows wide patency of the canal without extradural defect or nerve compression. Flexion extension does not show any abnormal motion. Thoracic region is widely patent without evidence of  stenosis or delay of contrast passage. Cervical region shows no stenosis or nerve root compression. No abnormal motion with flexion and extension.  CT CERVICAL MYELOGRAM FINDINGS:  Alignment is normal. There are minimal, non-compressive disc bulges at C3-4 and C4-5. There is minimal uncovertebral prominence at C5-6. Patient does not show significant narrowing of the canal or foramina at any level. Cord shadow is normal. No neck soft tissue lesion is seen.  CT LUMBAR MYELOGRAM FINDINGS:  There is no abnormality at L3-4 or above. The discs are normal. The canal and foramina are widely patent. The distal cord and conus are normal with the conus tip at L1.  At L4-5, the  disc bulges mildly, more towards the right. There is bilateral facet osteoarthritis. No central canal stenosis. Mild foraminal narrowing on the right without visible neural compression.  At L5-S1, there is a disc bulge with a shallow left posterior lateral herniation showing caudal migration behind S1. This approaches the left S1 nerve root but does not cause neural compression. There is mild facet degeneration at this level.  Sacroiliac joints appear normal.  CT THORACIC MYELOGRAM FINDINGS:  Mild curvature convex to the right. Old compression deformities are seen affecting T7, in the superior endplate of T9. No significant loss of height or retropulsion. The spinal canal is widely patent throughout the region. The thoracic spinal cord appears normal. There are minimal, non-compressive disc bulges from T7-8 through T10-11. There is ordinary facet osteoarthritis in the thoracic region which could contribute to back pain. Chronic endplate Schmorl's nodes are incidentally noted. Paravertebral soft tissues are unremarkable. Visualized lung parenchyma is normal.  IMPRESSION: Lumbar region: Shallow disc herniation centrally and towards the left at L5-S1 with caudal migration behind S1 which approaches the left S1 nerve root but does not appear to cause neural compression.  Disc bulge at L4-5 more prominent in the right foraminal region. Bilateral facet osteoarthritis. No apparent neural compression. The findings could contribute to low back pain.  Thoracic region: Old compression deformities at T7 and T9. Chronic endplate Schmorl's nodes. Minimal, non-compressive disc bulges from T7-8 through T10-11. Ordinary mild thoracic facet arthropathy. These findings could contribute to back pain but there is no neural compressive pathology.  Cervical region: Minimal, non-compressive disc bulges at C3-4 and C4-5. Minimal uncovertebral hypertrophy at C5-6. No stenosis or neural compression. No  facet arthropathy.   Electronically Signed   By: Nelson Chimes M.D.   On: 04/08/2016 13:59    Lumbar DG (Complete) 4+V:  Results for orders placed during the hospital encounter of 08/29/18  DG Lumbar Spine Complete   Narrative CLINICAL DATA:  Pain after fall 2 weeks ago  EXAM: LUMBAR SPINE - COMPLETE 4+ VIEW  COMPARISON:  None.  FINDINGS: No fracture or traumatic malalignment. Mild multilevel degenerative disc disease. No other acute abnormalities.  IMPRESSION: Mild multilevel degenerative disc disease. No other acute abnormalities.   Electronically Signed   By: Dorise Bullion III M.D   On: 08/29/2018 17:22          Lumbar DG Myelogram:  Results for orders placed during the hospital encounter of 04/08/16  DG [place]   Narrative CLINICAL DATA:  Severe total spine pain. Numbness in the lower extremities.  FLUOROSCOPY TIME:  1 minutes 12 seconds. 175.25 micro gray meter squared  PROCEDURE: LUMBAR PUNCTURE FOR CERVICAL LUMBAR AND THORACIC MYELOGRAM  CERVICAL AND LUMBAR AND THORACIC MYELOGRAM  CT CERVICAL MYELOGRAM  CT LUMBAR MYELOGRAM  CT THORACIC MYELOGRAM  After  thorough discussion of risks and benefits of the procedure including bleeding, infection, injury to nerves, blood vessels, adjacent structures as well as headache and CSF leak, written and oral informed consent was obtained. Consent was obtained by Dr. Nelson Chimes.  Patient was positioned prone on the fluoroscopy table. Local anesthesia was provided with 1% lidocaine without epinephrine after prepped and draped in the usual sterile fashion. Puncture was performed at L3-4 using a 3 1/2 inch 22-gauge spinal needle via left para median approach. Using a single pass through the dura, the needle was placed within the thecal sac, with return of clear CSF. 10 mL Isovue-300 was injected into the thecal sac, with normal opacification of the nerve roots and cauda equina  consistent with free flow within the subarachnoid space. The patient was then moved to the trendelenburg position and contrast flowed into the Thoracic and Cervical spine regions.  I personally performed the lumbar puncture and administered the intrathecal contrast. I also personally performed acquisition of the myelogram images.  TECHNIQUE: Contiguous axial images were obtained through the Cervical, Thoracic, and Lumbar spine after the intrathecal infusion of infusion. Coronal and sagittal reconstructions were obtained of the axial image sets.  FINDINGS: CERVICAL, thoracic AND LUMBAR MYELOGRAM FINDINGS:  Lumbar region shows wide patency of the canal without extradural defect or nerve compression. Flexion extension does not show any abnormal motion. Thoracic region is widely patent without evidence of stenosis or delay of contrast passage. Cervical region shows no stenosis or nerve root compression. No abnormal motion with flexion and extension.  CT CERVICAL MYELOGRAM FINDINGS:  Alignment is normal. There are minimal, non-compressive disc bulges at C3-4 and C4-5. There is minimal uncovertebral prominence at C5-6. Patient does not show significant narrowing of the canal or foramina at any level. Cord shadow is normal. No neck soft tissue lesion is seen.  CT LUMBAR MYELOGRAM FINDINGS:  There is no abnormality at L3-4 or above. The discs are normal. The canal and foramina are widely patent. The distal cord and conus are normal with the conus tip at L1.  At L4-5, the disc bulges mildly, more towards the right. There is bilateral facet osteoarthritis. No central canal stenosis. Mild foraminal narrowing on the right without visible neural compression.  At L5-S1, there is a disc bulge with a shallow left posterior lateral herniation showing caudal migration behind S1. This approaches the left S1 nerve root but does not cause neural compression. There is mild facet degeneration  at this level.  Sacroiliac joints appear normal.  CT THORACIC MYELOGRAM FINDINGS:  Mild curvature convex to the right. Old compression deformities are seen affecting T7, in the superior endplate of T9. No significant loss of height or retropulsion. The spinal canal is widely patent throughout the region. The thoracic spinal cord appears normal. There are minimal, non-compressive disc bulges from T7-8 through T10-11. There is ordinary facet osteoarthritis in the thoracic region which could contribute to back pain. Chronic endplate Schmorl's nodes are incidentally noted. Paravertebral soft tissues are unremarkable. Visualized lung parenchyma is normal.  IMPRESSION: Lumbar region: Shallow disc herniation centrally and towards the left at L5-S1 with caudal migration behind S1 which approaches the left S1 nerve root but does not appear to cause neural compression.  Disc bulge at L4-5 more prominent in the right foraminal region. Bilateral facet osteoarthritis. No apparent neural compression. The findings could contribute to low back pain.  Thoracic region: Old compression deformities at T7 and T9. Chronic endplate Schmorl's nodes. Minimal, non-compressive disc bulges from  T7-8 through T10-11. Ordinary mild thoracic facet arthropathy. These findings could contribute to back pain but there is no neural compressive pathology.  Cervical region: Minimal, non-compressive disc bulges at C3-4 and C4-5. Minimal uncovertebral hypertrophy at C5-6. No stenosis or neural compression. No facet arthropathy.   Electronically Signed   By: Nelson Chimes M.D.   On: 04/08/2016 13:59      Spine Imaging: Whole Spine DG Myelogram views:  Results for orders placed during the hospital encounter of 04/08/16  DG MYELOGRAPHY LUMBAR INJ [place]   Narrative CLINICAL DATA:  Severe total spine pain. Numbness in the lower extremities.  FLUOROSCOPY TIME:  1 minutes 12 seconds. 175.25 micro gray  meter squared  PROCEDURE: LUMBAR PUNCTURE FOR CERVICAL LUMBAR AND THORACIC MYELOGRAM  CERVICAL AND LUMBAR AND THORACIC MYELOGRAM  CT CERVICAL MYELOGRAM  CT LUMBAR MYELOGRAM  CT THORACIC MYELOGRAM  After thorough discussion of risks and benefits of the procedure including bleeding, infection, injury to nerves, blood vessels, adjacent structures as well as headache and CSF leak, written and oral informed consent was obtained. Consent was obtained by Dr. Nelson Chimes.  Patient was positioned prone on the fluoroscopy table. Local anesthesia was provided with 1% lidocaine without epinephrine after prepped and draped in the usual sterile fashion. Puncture was performed at L3-4 using a 3 1/2 inch 22-gauge spinal needle via left para median approach. Using a single pass through the dura, the needle was placed within the thecal sac, with return of clear CSF. 10 mL Isovue-300 was injected into the thecal sac, with normal opacification of the nerve roots and cauda equina consistent with free flow within the subarachnoid space. The patient was then moved to the trendelenburg position and contrast flowed into the Thoracic and Cervical spine regions.  I personally performed the lumbar puncture and administered the intrathecal contrast. I also personally performed acquisition of the myelogram images.  TECHNIQUE: Contiguous axial images were obtained through the Cervical, Thoracic, and Lumbar spine after the intrathecal infusion of infusion. Coronal and sagittal reconstructions were obtained of the axial image sets.  FINDINGS: CERVICAL, thoracic AND LUMBAR MYELOGRAM FINDINGS:  Lumbar region shows wide patency of the canal without extradural defect or nerve compression. Flexion extension does not show any abnormal motion. Thoracic region is widely patent without evidence of stenosis or delay of contrast passage. Cervical region shows no stenosis or nerve root compression. No abnormal  motion with flexion and extension.  CT CERVICAL MYELOGRAM FINDINGS:  Alignment is normal. There are minimal, non-compressive disc bulges at C3-4 and C4-5. There is minimal uncovertebral prominence at C5-6. Patient does not show significant narrowing of the canal or foramina at any level. Cord shadow is normal. No neck soft tissue lesion is seen.  CT LUMBAR MYELOGRAM FINDINGS:  There is no abnormality at L3-4 or above. The discs are normal. The canal and foramina are widely patent. The distal cord and conus are normal with the conus tip at L1.  At L4-5, the disc bulges mildly, more towards the right. There is bilateral facet osteoarthritis. No central canal stenosis. Mild foraminal narrowing on the right without visible neural compression.  At L5-S1, there is a disc bulge with a shallow left posterior lateral herniation showing caudal migration behind S1. This approaches the left S1 nerve root but does not cause neural compression. There is mild facet degeneration at this level.  Sacroiliac joints appear normal.  CT THORACIC MYELOGRAM FINDINGS:  Mild curvature convex to the right. Old compression deformities are seen affecting  T7, in the superior endplate of T9. No significant loss of height or retropulsion. The spinal canal is widely patent throughout the region. The thoracic spinal cord appears normal. There are minimal, non-compressive disc bulges from T7-8 through T10-11. There is ordinary facet osteoarthritis in the thoracic region which could contribute to back pain. Chronic endplate Schmorl's nodes are incidentally noted. Paravertebral soft tissues are unremarkable. Visualized lung parenchyma is normal.  IMPRESSION: Lumbar region: Shallow disc herniation centrally and towards the left at L5-S1 with caudal migration behind S1 which approaches the left S1 nerve root but does not appear to cause neural compression.  Disc bulge at L4-5 more prominent in the right  foraminal region. Bilateral facet osteoarthritis. No apparent neural compression. The findings could contribute to low back pain.  Thoracic region: Old compression deformities at T7 and T9. Chronic endplate Schmorl's nodes. Minimal, non-compressive disc bulges from T7-8 through T10-11. Ordinary mild thoracic facet arthropathy. These findings could contribute to back pain but there is no neural compressive pathology.  Cervical region: Minimal, non-compressive disc bulges at C3-4 and C4-5. Minimal uncovertebral hypertrophy at C5-6. No stenosis or neural compression. No facet arthropathy.   Electronically Signed   By: Nelson Chimes M.D.   On: 04/08/2016 13:59     Hip-R DG 2-3 views:  Results for orders placed during the hospital encounter of 08/29/18  DG HIP UNILAT WITH PELVIS 2-3 VIEWS RIGHT   Narrative CLINICAL DATA:  Pain after trauma 2 weeks ago.  EXAM: DG HIP (WITH OR WITHOUT PELVIS) 2-3V RIGHT  COMPARISON:  None.  FINDINGS: There is no evidence of hip fracture or dislocation. There is no evidence of arthropathy or other focal bone abnormality.  IMPRESSION: Negative.   Electronically Signed   By: Dorise Bullion III M.D   On: 08/29/2018 17:21     Knee-R DG 4 views:  Results for orders placed during the hospital encounter of 05/23/18  DG Knee Complete 4 Views Right   Narrative CLINICAL DATA:  Chronic bilateral knee pain  EXAM: RIGHT KNEE - COMPLETE 4+ VIEW  COMPARISON:  None.  FINDINGS: Mild irregularity of the articular surface of the patella. Mediolateral joint spaces normal. Negative for fracture or effusion.  IMPRESSION: Mild patellofemoral degenerative change.   Electronically Signed   By: Franchot Gallo M.D.   On: 05/23/2018 10:04    Knee-L DG 4 views:  Results for orders placed during the hospital encounter of 05/23/18  DG Knee Complete 4 Views Left   Narrative CLINICAL DATA:  Chronic bilateral knee pain  EXAM: LEFT KNEE - COMPLETE 4+  VIEW  COMPARISON:  None.  FINDINGS: Mild tricompartmental degenerative change with mild joint space narrowing and spurring. No fracture or joint effusion. No loose body.  IMPRESSION: Mild degenerative change.   Electronically Signed   By: Franchot Gallo M.D.   On: 05/23/2018 10:03      Ankle Imaging: Ankle-R DG Complete:  Results for orders placed during the hospital encounter of 06/25/04  DG Ankle Complete Right   Narrative Clinical Data: Right foot and ankle pain for several days. Fracture one year ago.   RIGHT ANKLE:  Three views of the right ankle show no evidence of fracture, dislocation or foreign body. Ankle mortis is well maintained.   IMPRESSION:   Normal right ankle.   RIGHT FOOT:  Three views of the right foot are made and are compared to previous studies of 11/28/03 and show no definite fracture, dislocation or radiopaque foreign body.  There is  a small area of degenerative hypertrophic spurring suggesting some mild arthritic change along the dorsal aspect of juncture of the metatarsal tarsal joints best seen on the lateral view.  IMPRESSION:  No evidence of acute fracture or dislocation.   Stable hypertrophic spurring, probably suggesting some arthritic change in the region of the juncture proximal metatarsals and tarsal bones unchanged.  Provider: Lou Miner   Ankle-L DG Complete:  Results for orders placed during the hospital encounter of 06/13/18  DG Ankle Complete Left   Narrative CLINICAL DATA:  Left ankle pain after injury 3 days ago.  EXAM: LEFT ANKLE COMPLETE - 3+ VIEW  COMPARISON:  None.  FINDINGS: There is no evidence of fracture, dislocation, or joint effusion. There is no evidence of arthropathy or other focal bone abnormality. Soft tissues are unremarkable.  IMPRESSION: Negative.   Electronically Signed   By: Marijo Conception, M.D.   On: 06/13/2018 13:46     Foot Imaging: Foot-R DG Complete:  Results for orders placed during  the hospital encounter of 04/13/14  DG Foot Complete Right   Narrative CLINICAL DATA:  One day history of pain dorsal midfoot following trauma with stepping into a hole  EXAM: RIGHT FOOT COMPLETE - 3+ VIEW  COMPARISON:  Dec 02, 2006  FINDINGS: Frontal, oblique, and lateral views were obtained. There is no fracture or dislocation. There has been progression of osteoarthritic change in the tarsal-metatarsal joint region medially. Other joint spaces appear normal. No erosive change.  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 Other joint spaces appear unremarkable. No fracture or dislocation.   Electronically Signed   By: Lowella Grip M.D.   On: 04/13/2014 13:42    Foot-L DG Complete:  Results for orders placed during the hospital encounter of 06/13/18  DG Foot Complete Left   Narrative CLINICAL DATA:  Left ankle pain after injury 3 days ago.  EXAM: LEFT FOOT - COMPLETE 3+ VIEW  COMPARISON:  Radiographs of April 07, 2011.  FINDINGS: There is no evidence of fracture or dislocation. There is no evidence of arthropathy or other focal bone abnormality. Soft tissues are unremarkable.  IMPRESSION: Negative.   Electronically Signed   By: Marijo Conception, M.D.   On: 06/13/2018 13:49     Wrist-L DG Complete:  Results for orders placed during the hospital encounter of 03/29/08  DG Wrist Complete Left   Narrative Clinical Data: Wrist fracture reduction   LEFT WRIST - COMPLETE 3+ VIEW   Comparison: 03/25/2008   Findings: A series electronic spot images from the operating room were obtained.  Final images show two wires transfixing a distal radial fracture into good position and alignment.  Fraction screws have been placed in the distal radius and in the metacarpals as well.   IMPRESSION: Good position alignment following ORIF for distal radial fracture.  Provider: Jennye Boroughs    Complexity  Note: Imaging results reviewed. Results shared with Ms. Mcwatters, using Layman's terms.                         [place]  Drug: Ms. Rostad  reports no history of drug use. Alcohol:  reports no history of alcohol use. Tobacco:  reports that she quit smoking about 10 years ago. Her smoking use included cigarettes. She smoked 4.00 packs per day. She has never used smokeless tobacco. Medical:  has a past medical history of Anemia, Asthma, Bipolar 1 disorder ([place]), Bronchitis,  COPD (chronic obstructive pulmonary disease) ([place]), Diverticulitis, IBS (irritable bowel syndrome), Migraine, Neuropathy, Schizophrenia ([place]), and Thyroid disease. Family: family history includes Bipolar disorder in her mother; Dementia in her father and mother; Other in her son.  Past Surgical History:  Procedure Laterality Date  . ABDOMINAL HYSTERECTOMY    . broke left arm    . CESAREAN SECTION    . COLONOSCOPY N/A 08/30/2014   Procedure: COLONOSCOPY;  Surgeon: Rogene Houston, MD;  Location[facility];  Service: Endoscopy;  Laterality: N/A;  1200  . ELBOW SURGERY    . FOOT SURGERY    . KNEE SURGERY    . TONSILLECTOMY     Active Ambulatory Problems    Diagnosis Date Noted  . ASTHMA 03/27/2008  . COLLES' FRACTURE, LEFT WRIST 03/27/2008  . Midline thoracic back pain 08/21/2014  . Abnormality of gait 08/21/2014  . Urinary urgency 08/21/2014  . Bipolar disorder ([place]) 02/14/2015  . Sigmoid diverticulitis 08/03/2015  . Hypokalemia 08/03/2015  . Normocytic anemia 08/03/2015  . COPD with asthma ([place])   . Thyroid activity decreased   . Bipolar disorder in partial remission ([place])   . COPD (chronic obstructive pulmonary disease) with acute bronchitis ([place]) 08/23/2015  . Acute respiratory failure with hypoxia ([place]) 08/23/2015  . Well woman exam with routine gynecological exam 06/15/2016  . Bipolar 1 disorder, mixed, moderate ([place]) 08/27/2016  . PTSD (post-traumatic stress disorder) 12/11/2016  . Lumbar spondylosis  06/21/2019  . Lumbar degenerative disc disease 06/21/2019  . Cervical facet joint syndrome 06/21/2019  . Sacroiliac joint pain 06/21/2019   Resolved Ambulatory Problems    Diagnosis Date Noted  . Acute diverticulitis 08/03/2015  . Sepsis [facility])    Past Medical History:  Diagnosis Date  . Anemia   . Asthma   . Bipolar 1 disorder ([place])   . Bronchitis   . COPD (chronic obstructive pulmonary disease) ([place])   . Diverticulitis   . IBS (irritable bowel syndrome)   . Migraine   . Neuropathy   . Schizophrenia ([place])   . Thyroid disease    Assessment  Primary Diagnosis & Pertinent Problem List: The primary encounter diagnosis was Lumbar facet arthropathy. Diagnoses of Lumbar spondylosis, Lumbar degenerative disc disease, Cervical facet joint syndrome, and Sacroiliac joint pain were also pertinent to this visit.  Visit Diagnosis (New problems to examiner): 1. Lumbar facet arthropathy   2. Lumbar spondylosis   3. Lumbar degenerative disc disease   4. Cervical facet joint syndrome   5. Sacroiliac joint pain    Plan of Care (Initial workup plan)   I had extensive discussion with the patient about the goals of pain management.  We discussed nonpharmacological approaches to pain management that include physical therapy, dieting, sleep hygiene, psychotherapy, interventional therapy.  We discussed the importance of understanding the type of pain including neuropathic, nociceptive, centralized.  I also stressed the importance of multimodal analgesia with an emphasis on nondrug modalities including self management, behavioral health support and physical therapy.  We discussed the importance of physical therapy and how a individualized physical therapy and occupational therapy program tailored to patient limitations can be helpful at improving physical function. We also discussed the importance of insomnia and disrupted sleep and how improved sleep hygiene and cognitive therapy could be helpful.   Psychotherapy including CBT, mind-body therapies, pain coping strategies can be helpful for patients whose pain impacts mood, sleep, quality of life, relationships with others.  We discussed avoiding benzodiazepines.  I also had an extensive  discussion with the patient about interventional therapies which is my expertise and how these could be incorporated into an effective multimodal pain management plan.  At this point, I have limited options for the patient.  I informed her directly that I do not feel comfortable taking over her opioid therapy in the context of her bipolar disorder.  I instructed the patient that I usually do not take patients on for medication management unless we also explore interventional options and other pain modalities.  Patient does have significant lumbar facet arthropathy and I did discuss diagnostic lumbar facet medial branch nerve blocks with her.  We also discussed alternative behavioral therapies such as CBT.  Patient however is only interested in continuation of her opioid medications which unfortunately I would not be able to assist with as patient is high risk given her psych history in the context of opioid dependency.   Provider-requested follow-up: No follow-ups on file.  Future Appointments  Date Time Provider [place]  07/13/2019  2:15 PM Minus Liberty, PA-C [facility] None  08/08/2019  2:30 PM Nevada Crane, MD [facility] None    Total duration of non-face-to-face encounter: 30 minutes.  Primary Care Physician: Lucia Gaskins, MD Location: [facility] Note by: Gillis Santa, MD Date: 06/20/2019; Time: 10:54 PM  Note: This dictation was prepared with Dragon dictation. Any transcriptional errors that may result from this process are unintentional.

## 2019-06-19 NOTE — Progress Notes (Signed)
Pain in neck, upper, and lower back due to osteoarthritis and hx of fx vertebra after a fall. Also has migraines.

## 2019-06-20 ENCOUNTER — Ambulatory Visit
Payer: Medicare Other | Attending: Student in an Organized Health Care Education/Training Program | Admitting: Student in an Organized Health Care Education/Training Program

## 2019-06-20 ENCOUNTER — Other Ambulatory Visit: Payer: Self-pay

## 2019-06-20 DIAGNOSIS — M47812 Spondylosis without myelopathy or radiculopathy, cervical region: Secondary | ICD-10-CM | POA: Diagnosis not present

## 2019-06-20 DIAGNOSIS — M47816 Spondylosis without myelopathy or radiculopathy, lumbar region: Secondary | ICD-10-CM | POA: Diagnosis not present

## 2019-06-20 DIAGNOSIS — M5136 Other intervertebral disc degeneration, lumbar region: Secondary | ICD-10-CM | POA: Diagnosis not present

## 2019-06-20 DIAGNOSIS — M461 Sacroiliitis, not elsewhere classified: Secondary | ICD-10-CM | POA: Diagnosis not present

## 2019-06-20 DIAGNOSIS — M533 Sacrococcygeal disorders, not elsewhere classified: Secondary | ICD-10-CM

## 2019-06-21 ENCOUNTER — Encounter: Payer: Self-pay | Admitting: Student in an Organized Health Care Education/Training Program

## 2019-06-21 DIAGNOSIS — M47812 Spondylosis without myelopathy or radiculopathy, cervical region: Secondary | ICD-10-CM | POA: Insufficient documentation

## 2019-06-21 DIAGNOSIS — M533 Sacrococcygeal disorders, not elsewhere classified: Secondary | ICD-10-CM | POA: Insufficient documentation

## 2019-06-21 DIAGNOSIS — M5136 Other intervertebral disc degeneration, lumbar region: Secondary | ICD-10-CM | POA: Insufficient documentation

## 2019-06-21 DIAGNOSIS — M47816 Spondylosis without myelopathy or radiculopathy, lumbar region: Secondary | ICD-10-CM | POA: Insufficient documentation

## 2019-07-13 ENCOUNTER — Ambulatory Visit (INDEPENDENT_AMBULATORY_CARE_PROVIDER_SITE_OTHER): Payer: Medicare Other | Admitting: Gastroenterology

## 2019-08-08 ENCOUNTER — Encounter: Payer: Self-pay | Admitting: Psychiatry

## 2019-08-08 ENCOUNTER — Ambulatory Visit (INDEPENDENT_AMBULATORY_CARE_PROVIDER_SITE_OTHER): Payer: Medicare Other | Admitting: Psychiatry

## 2019-08-08 ENCOUNTER — Other Ambulatory Visit: Payer: Self-pay

## 2019-08-08 DIAGNOSIS — F4310 Post-traumatic stress disorder, unspecified: Secondary | ICD-10-CM

## 2019-08-08 DIAGNOSIS — F319 Bipolar disorder, unspecified: Secondary | ICD-10-CM | POA: Diagnosis not present

## 2019-08-08 MED ORDER — PAROXETINE HCL 30 MG PO TABS: 30.0000 mg | ORAL_TABLET | Freq: Every day | ORAL | 0 refills | Status: DC

## 2019-08-08 MED ORDER — ZIPRASIDONE HCL 60 MG PO CAPS: 60.0000 mg | ORAL_CAPSULE | Freq: Two times a day (BID) | ORAL | 0 refills | Status: DC

## 2019-08-08 MED ORDER — GABAPENTIN 300 MG PO CAPS: ORAL_CAPSULE | ORAL | 0 refills | Status: DC

## 2019-08-08 NOTE — Progress Notes (Signed)
[place] MD OP Progress Note  Virtual Visit via Telephone Note  I connected with Kristen Ryan on 08/08/19 at  2:30 PM EST by telephone and verified that I am speaking with the correct person using two identifiers.  I discussed the limitations, risks, security and privacy concerns of performing an evaluation and management service by telephone and the availability of in person appointments. I also discussed with the patient that there may be a patient responsible charge related to this service. The patient expressed understanding and agreed to proceed.   08/08/2019 2:38 PM Kristen Ryan  MRN:  [id number]  Chief Complaint:  " I am okay but can do better."  HPI: Patient reported that she continues to feel irritable at times.  Her mood is as not as good as it used to be.  She still has crying spells.  She has tried higher dose of Paxil at 40 mg but was unable to tolerate it.  She stated that the only medicine that has ever helped her is Xanax however no one will prescribe that to her. As per PDMP, she is also taking oxycodone.  Patient was explained that benzodiazepine cannot be combined with opioids due to the risk of respiratory depression.  Patient stated that she used to take both of them together in the past until she started seeing her current psychiatrist.  She informed that she has suffered from restless leg syndrome from a very young age.  She found Xanax to be helpful for that as well.   She stated that her legs constantly jump and move and by the time is 5 or 6 PM she is worn out and feels exhausted.  She is taking iron supplementation twice a day which does not seem to help much.  She reported that she is taking gabapentin prescribed by her PCP for bipolar disorder.  She was agreeable to trying a higher dose of gabapentin at bedtime to see if that helps with her restless leg syndrome.   Visit Diagnosis:    ICD-10-CM   1. Bipolar 1 disorder (HCC)  F31.9   2. PTSD (post-traumatic stress  disorder)  F43.10     Past Psychiatric History: Bipolar 1 disorder, PTSD  Past Medical History:  Past Medical History:  Diagnosis Date  . Anemia   . Asthma   . Bipolar 1 disorder ([place])   . Bronchitis   . COPD (chronic obstructive pulmonary disease) ([place])   . Diverticulitis   . IBS (irritable bowel syndrome)   . Migraine   . Neuropathy   . Schizophrenia ([place])   . Thyroid disease     Past Surgical History:  Procedure Laterality Date  . ABDOMINAL HYSTERECTOMY    . broke left arm    . CESAREAN SECTION    . COLONOSCOPY N/A 08/30/2014   Procedure: COLONOSCOPY;  Surgeon: Rogene Houston, MD;  [facility];  Service: Endoscopy;  Laterality: N/A;  1200  . ELBOW SURGERY    . FOOT SURGERY    . KNEE SURGERY    . TONSILLECTOMY      Family Psychiatric History: see below  Family History:  Family History  Problem Relation Age of Onset  . Dementia Mother   . Bipolar disorder Mother   . Dementia Father   . Other Son        MVA accident    Social History:  Social History   Socioeconomic History  . Marital status: Married    Spouse name: Not  on file  . Number of children: Not on file  . Years of education: Not on file  . Highest education level: Not on file  Occupational History  . Not on file  Tobacco Use  . Smoking status: Former Smoker    Packs/day: 4.00    Types: Cigarettes    Quit date: 07/06/2008    Years since quitting: 11.0  . Smokeless tobacco: Never Used  Substance and Sexual Activity  . Alcohol use: No  . Drug use: No  . Sexual activity: Yes    Birth control/protection: Surgical    Comment: hyst  Other Topics Concern  . Not on file  Social History Narrative  . Not on file   Social Determinants of Health   Financial Resource Strain:   . Difficulty of Paying Living Expenses: Not on file  Food Insecurity:   . Worried About Programme researcher, broadcasting/film/video in the Last Year: Not on file  . Ran Out of Food in the Last Year: Not on file  Transportation  Needs:   . Lack of Transportation (Medical): Not on file  . Lack of Transportation (Non-Medical): Not on file  Physical Activity:   . Days of Exercise per Week: Not on file  . Minutes of Exercise per Session: Not on file  Stress:   . Feeling of Stress : Not on file  Social Connections:   . Frequency of Communication with Friends and Family: Not on file  . Frequency of Social Gatherings with Friends and Family: Not on file  . Attends Religious Services: Not on file  . Active Member of Clubs or Organizations: Not on file  . Attends Banker Meetings: Not on file  . Marital Status: Not on file    Allergies:  Allergies  Allergen Reactions  . Aminophylline Anaphylaxis  . Sumatriptan Anaphylaxis and Other (See Comments)    Causes fainting  . Theophylline Anaphylaxis  . Codeine Itching and Other (See Comments)    Too strong for patient   . Depakote [Divalproex Sodium] Other (See Comments)    Hair loss  . Divalproex Sodium Other (See Comments)    Causes hair to fall out  . Doxycycline Other (See Comments)    headache  . Lamictal [Lamotrigine]     Destroys thyroid  . Lithium Other (See Comments)    Adverse reaction to Thyroid   . Magnesium-Containing Compounds Hives  . Metronidazole   . Nabumetone Swelling  . Naproxen Swelling  . Other     Hair Ryan  . Penicillins Nausea And Vomiting    Has patient had a PCN reaction causing immediate rash, facial/tongue/throat swelling, SOB or lightheadedness with hypotension: Yes Has patient had a PCN reaction causing severe rash involving mucus membranes or skin necrosis: No Has patient had a PCN reaction that required hospitalization Yes Has patient had a PCN reaction occurring within the last 10 years: No If all of the above answers are "NO", then may proceed with Cephalosporin use.   Marland Kitchen Pentazocine Lactate Other (See Comments)    Unknown reaction  . Risperidone Other (See Comments)    Insomnia   . Septra Ds  [Sulfamethoxazole-Trimethoprim]   . Seroquel [Quetiapine] Swelling  . Tegretol [Carbamazepine] Swelling  . Clonidine Derivatives     Dizziness at 0.1 mg  . Tetracycline Rash    Headaches  . Zyprexa [Olanzapine] Rash and Other (See Comments)    Insomnia    Metabolic Disorder Labs: No results found for: HGBA1C, MPG No results  found for: PROLACTIN No results found for: CHOL, TRIG, HDL, CHOLHDL, VLDL, LDLCALC Lab Results  Component Value Date   TSH 0.888 07/07/2010    Therapeutic Level Labs: Lab Results  Component Value Date   LITHIUM 1.27 10/25/2013   No results found for: VALPROATE No components found for:  CBMZ  Current Medications: Current Outpatient Medications  Medication Sig Dispense Refill  . albuterol (PROVENTIL HFA;VENTOLIN HFA) 108 (90 Base) MCG/ACT inhaler Inhale 1-2 puffs into the lungs every 6 (six) hours as needed for wheezing or shortness of breath. 1 Inhaler 0  . aspirin 325 MG tablet Take 325 mg by mouth daily.    . budesonide (PULMICORT) 1 MG/2ML nebulizer solution Inhale into the lungs.    . budesonide-formoterol (SYMBICORT) 160-4.5 MCG/ACT inhaler Inhale 2 puffs into the lungs 2 (two) times daily. (Patient not taking: Reported on 06/19/2019) 1 Inhaler 1  . Calcium Carbonate (CALCIUM 600 PO) Take 1 tablet by mouth 2 (two) times daily.    . Cholecalciferol (VITAMIN D-1000 MAX ST) 25 MCG (1000 UT) tablet Take by mouth.    . ciprofloxacin (CIPRO) 500 MG tablet Take 1 tablet (500 mg total) by mouth 2 (two) times daily. (Patient not taking: Reported on 06/19/2019) 20 tablet 0  . diclofenac sodium (VOLTAREN) 1 % GEL SMARTSIG:2-4 Gram(s) Topical Daily    . dicyclomine (BENTYL) 10 MG capsule Take 1 capsule by mouth 3 (three) times daily.    . Dupilumab (DUPIXENT) 300 MG/2ML SOPN Inject into the skin.    Marland Kitchen EPINEPHrine 0.3 mg/0.3 mL IJ SOAJ injection INJECT (0.3)ML INTO THEUMUSCLE ONCE AS NEEDED FORNANAPHYLAXIS FOR ONE DOSE.    . ferrous sulfate 220 (44 Fe) MG/5ML  solution Take 220 mg by mouth 2 (two) times a day.    . fexofenadine (ALLEGRA) 180 MG tablet Take 180 mg by mouth daily.    . fluticasone (CUTIVATE) 0.05 % cream MIX WITH KETOCONAZOLE CREAM AND APPLY TO AFFECTED AREA UP TO TWICE DAILY AS NEEDED. (Patient not taking: Reported on 06/19/2019) 30 g 2  . gabapentin (NEURONTIN) 300 MG capsule Take 300 mg by mouth 2 (two) times daily.    Marland Kitchen ipratropium-albuterol (DUONEB) 0.5-2.5 (3) MG/3ML SOLN Inhale into the lungs.    Marland Kitchen ketoconazole (NIZORAL) 2 % cream MIX WITH FLUTICASONE CREAM AND APPLY TO AFFECTED AREA UP TO TWICE DAILY AS NEEDED. (Patient not taking: Reported on 06/19/2019) 30 g 0  . levETIRAcetam (KEPPRA) 750 MG tablet Take 1 tablet by mouth daily.    Marland Kitchen levothyroxine (SYNTHROID) 50 MCG tablet Take 1 tablet by mouth daily.    Marland Kitchen loratadine (CLARITIN) 10 MG tablet Take 10 mg by mouth daily.    . Magnesium 250 MG TABS Take by mouth.    . metoprolol tartrate (LOPRESSOR) 25 MG tablet Take 1 tablet by mouth 2 (two) times a day.    . metroNIDAZOLE (FLAGYL) 500 MG tablet Take 1 tablet (500 mg total) by mouth 2 (two) times daily. (Patient not taking: Reported on 06/19/2019) 20 tablet 0  . Multiple Vitamin (MULTIVITAMIN WITH MINERALS) TABS tablet Take 1 tablet by mouth daily.    . naproxen (NAPROSYN) 500 MG tablet Take 500 mg by mouth 2 (two) times daily with a meal.    . oxyCODONE-acetaminophen (PERCOCET/ROXICET) 5-325 MG tablet Take 1 tablet by mouth 3 (three) times daily as needed for pain.    . pantoprazole (PROTONIX) 40 MG tablet Take 1 tablet by mouth daily.    Marland Kitchen PARoxetine (PAXIL) 30 MG tablet Take 1  tablet (30 mg total) by mouth daily. 90 tablet 0  . Red Yeast Rice 600 MG CAPS Take 1 capsule by mouth 2 (two) times daily.    . Selenium 200 MCG TABS Take by mouth daily.    . simvastatin (ZOCOR) 40 MG tablet Take by mouth.    . sodium chloride (OCEAN) 0.65 % SOLN nasal spray Place 1 spray into both nostrils as needed for congestion.    . TRELEGY  ELLIPTA 100-62.5-25 MCG/INH AEPB     . ziprasidone (GEODON) 60 MG capsule Take 1 capsule (60 mg total) by mouth 2 (two) times daily with a meal. 180 capsule 1   No current facility-administered medications for this visit.      Psychiatric Specialty Exam: Review of Systems  There were no vitals taken for this visit.There is no height or weight on file to calculate BMI.  General Appearance: unable to assess due to phone visit  Eye Contact:  unable to assess due to phone visit  Speech:  Clear and Coherent and Normal Rate  Volume:  Normal  Mood:  " Not so good"  Affect:  Congruent  Thought Process:  Goal Directed, Linear and Descriptions of Associations: Intact  Orientation:  Full (Time, Place, and Person)  Thought Content: Logical   Suicidal Thoughts:  No  Homicidal Thoughts:  No  Memory:  Recent;   Good Remote;   Good  Judgement:  Good  Insight:  Good  Psychomotor Activity:  Normal  Concentration:  Concentration: Good and Attention Span: Good  Recall:  Good  Fund of Knowledge: Good  Language: Good  Akathisia:  Negative  Handed:  Right  AIMS (if indicated): not done  Assets:  Communication Skills Desire for Improvement Financial Resources/Insurance Housing  ADL's:  Intact  Cognition: WNL  Sleep:  Fair     Screenings: PHQ2-9     Office Visit from 07/28/2016 in [facility] Office Visit from 06/15/2016 in [facility]  PHQ-2 Total Score  6  6  PHQ-9 Total Score  14  14       Assessment and Plan: 64 y.o. year old female with a history of bipolar I disorder, PTSD, COPD, IBS, neuropathy , who was contacted via phone for follow-up.  Patient reported that her mood is not as good as it used to be and she continues to deal with restless leg syndrome symptoms. She was agreeable to trial of increased dose of Gabapentin at bedtime for restless leg symptoms.  1. Bipolar 1 disorder (HCC)  - PARoxetine (PAXIL) 30 MG tablet; Take 1 tablet (30 mg total) by mouth  daily.  Dispense: 90 tablet; Refill: 0 - ziprasidone (GEODON) 60 MG capsule; Take 1 capsule (60 mg total) by mouth 2 (two) times daily with a meal.  Dispense: 180 capsule; Refill: 0 - gabapentin (NEURONTIN) 300 MG capsule; Take 1 capsule in the morning and take 2 capsules at night  Dispense: 270 capsule; Refill: 0  2. PTSD (post-traumatic stress disorder) - PARoxetine (PAXIL) 30 MG tablet; Take 1 tablet (30 mg total) by mouth daily.  Dispense: 90 tablet; Refill: 0  F/up in 2 months.   Zena Amos, MD 08/08/2019, 2:38 PM

## 2019-08-10 ENCOUNTER — Other Ambulatory Visit: Payer: Self-pay | Admitting: Adult Health

## 2019-09-07 ENCOUNTER — Other Ambulatory Visit: Payer: Self-pay

## 2019-09-07 ENCOUNTER — Emergency Department (HOSPITAL_COMMUNITY)
Admission: EM | Admit: 2019-09-07 | Discharge: 2019-09-07 | Disposition: A | Discharge status: Home / Self Care | Payer: Medicare Other | Attending: Emergency Medicine | Admitting: Emergency Medicine

## 2019-09-07 ENCOUNTER — Encounter (HOSPITAL_COMMUNITY): Payer: Self-pay | Admitting: Emergency Medicine

## 2019-09-07 DIAGNOSIS — J449 Chronic obstructive pulmonary disease, unspecified: Secondary | ICD-10-CM | POA: Insufficient documentation

## 2019-09-07 DIAGNOSIS — R1032 Left lower quadrant pain: Secondary | ICD-10-CM | POA: Diagnosis present

## 2019-09-07 DIAGNOSIS — Z87891 Personal history of nicotine dependence: Secondary | ICD-10-CM | POA: Insufficient documentation

## 2019-09-07 DIAGNOSIS — Z79899 Other long term (current) drug therapy: Secondary | ICD-10-CM | POA: Insufficient documentation

## 2019-09-07 DIAGNOSIS — N3000 Acute cystitis without hematuria: Secondary | ICD-10-CM

## 2019-09-07 DIAGNOSIS — K5792 Diverticulitis of intestine, part unspecified, without perforation or abscess without bleeding: Secondary | ICD-10-CM

## 2019-09-07 DIAGNOSIS — Z7982 Long term (current) use of aspirin: Secondary | ICD-10-CM | POA: Diagnosis not present

## 2019-09-07 LAB — CBC
HCT: 41.9 % (ref 36.0–46.0)
Hemoglobin: 12.8 g/dL (ref 12.0–15.0)
MCH: 29 pg (ref 26.0–34.0)
MCHC: 30.5 g/dL (ref 30.0–36.0)
MCV: 94.8 fL (ref 80.0–100.0)
Platelets: 218 10*3/uL (ref 150–400)
RBC: 4.42 MIL/uL (ref 3.87–5.11)
RDW: 12.8 % (ref 11.5–15.5)
WBC: 7.8 10*3/uL (ref 4.0–10.5)
nRBC: 0 % (ref 0.0–0.2)

## 2019-09-07 LAB — COMPREHENSIVE METABOLIC PANEL
ALT: 16 U/L (ref 0–44)
AST: 20 U/L (ref 15–41)
Albumin: 3.5 g/dL (ref 3.5–5.0)
Alkaline Phosphatase: 95 U/L (ref 38–126)
Anion gap: 10 (ref 5–15)
BUN: 21 mg/dL (ref 8–23)
CO2: 30 mmol/L (ref 22–32)
Calcium: 9.4 mg/dL (ref 8.9–10.3)
Chloride: 101 mmol/L (ref 98–111)
Creatinine, Ser: 1.03 mg/dL — ABNORMAL HIGH (ref 0.44–1.00)
GFR calc Af Amer: >60 mL/min (ref >60)
GFR calc non Af Amer: 58 mL/min — ABNORMAL LOW (ref >60)
Glucose, Bld: 111 mg/dL — ABNORMAL HIGH (ref 70–99)
Potassium: 3.8 mmol/L (ref 3.5–5.1)
Sodium: 141 mmol/L (ref 135–145)
Total Bilirubin: 0.5 mg/dL (ref 0.3–1.2)
Total Protein: 6.9 g/dL (ref 6.5–8.1)

## 2019-09-07 LAB — URINALYSIS, ROUTINE W REFLEX MICROSCOPIC
Bilirubin Urine: NEGATIVE
Glucose, UA: NEGATIVE mg/dL
Hgb urine dipstick: NEGATIVE
Ketones, ur: NEGATIVE mg/dL
Nitrite: POSITIVE — AB
Protein, ur: NEGATIVE mg/dL
Specific Gravity, Urine: 1.024 (ref 1.005–1.030)
pH: 5 (ref 5.0–8.0)

## 2019-09-07 LAB — LIPASE, BLOOD: Lipase: 26 U/L (ref 11–51)

## 2019-09-07 MED ORDER — METRONIDAZOLE 500 MG PO TABS
500.0000 mg | ORAL_TABLET | Freq: Once | ORAL | Status: AC
  Administered 2019-09-07: 500 mg via ORAL
  Filled 2019-09-07: qty 1

## 2019-09-07 MED ORDER — OXYCODONE-ACETAMINOPHEN 5-325 MG PO TABS: 1.0000 | ORAL_TABLET | Freq: Four times a day (QID) | ORAL | 0 refills | Status: DC | PRN

## 2019-09-07 MED ORDER — CIPROFLOXACIN HCL 250 MG PO TABS
500.0000 mg | ORAL_TABLET | Freq: Once | ORAL | Status: AC
  Administered 2019-09-07: 500 mg via ORAL
  Filled 2019-09-07: qty 2

## 2019-09-07 MED ORDER — METRONIDAZOLE 500 MG PO TABS: 500.0000 mg | ORAL_TABLET | Freq: Three times a day (TID) | ORAL | 0 refills | Status: DC

## 2019-09-07 MED ORDER — OXYCODONE-ACETAMINOPHEN 5-325 MG PO TABS
2.0000 | ORAL_TABLET | Freq: Once | ORAL | Status: AC
  Administered 2019-09-07: 2 via ORAL
  Filled 2019-09-07: qty 2

## 2019-09-07 MED ORDER — CIPROFLOXACIN HCL 500 MG PO TABS: 500.0000 mg | ORAL_TABLET | Freq: Two times a day (BID) | ORAL | 0 refills | Status: DC

## 2019-09-07 NOTE — ED Triage Notes (Signed)
Patient complains of Left lower abdominal pain that began yesterday. Patient states it feels like a diverticulitis attack. Patient endorses having eaten seeds.

## 2019-09-07 NOTE — Discharge Instructions (Addendum)
You were seen in the emergency department today for abdominal pain.  Your labs were overall reassuring.  They did show signs of a UTI.  We are sending you home with antibiotics and pain medicines to help treat for diverticulitis as well as what will cover for a urinary tract infection.  -Percocet-this is a narcotic/controlled substance medication that has potential addicting qualities.  We recommend that you take 1-2 tablets every 6 hours as needed for severe pain.  Do not drive or operate heavy machinery when taking this medicine as it can be sedating. Do not drink alcohol or take other sedating medications when taking this medicine for safety reasons.  Keep this out of reach of small children.  Please be aware this medicine has Tylenol in it (325 mg/tab) do not exceed the maximum dose of Tylenol in a day per over the counter recommendations should you decide to supplement with Tylenol over the counter.   -Ciprofloxacin and metronidazole-these are each antibiotics, please take them as prescribed.  Do not participate in quick movements, exercise, or sport while taking ciprofloxacin as it makes you at higher risk for tendon injury.  Do not drink alcohol when taking metronidazole as it can be extremely dangerous.  We have prescribed you new medication(s) today. Discuss the medications prescribed today with your pharmacist as they can have adverse effects and interactions with your other medicines including over the counter and prescribed medications. Seek medical evaluation if you start to experience new or abnormal symptoms after taking one of these medicines, seek care immediately if you start to experience difficulty breathing, feeling of your throat closing, facial swelling, or rash as these could be indications of a more serious allergic reaction  Follow a clear liquid diet for the first 24 hours.  Please follow-up with your primary care provider within 3 days for reevaluation.  Return to the emergency  department for new or worsening symptoms including but not limited to worsening pain, fever, inability to keep fluids down, blood in your stool, or any other concerns.

## 2019-09-07 NOTE — ED Provider Notes (Signed)
[facility] EMERGENCY DEPARTMENT Provider Note   CSN: [id number] Arrival date & time: 09/07/19  1355     History Chief Complaint  Patient presents with  . Abdominal Pain    Kristen Ryan is a 64 y.o. female with a history of diverticulitis, COPD, IBS, migraines, schizophrenia and prior abdominal surgeries including hysterectomy and C-section who presents to the emergency department with complaints of abdominal pain that began yesterday.  Patient states the pain is located in the left lower quadrant, it is stabbing/aching/dull, constant, progressively worsening, currently a 10 out of 10 in severity.  No alleviating or aggravating factors.  No intervention prior to arrival.  Patient states this feels exactly the same as her prior diverticulitis which has been treated with ciprofloxacin and Flagyl with improvement.  She has never required hospitalization or surgical intervention for abscess or perforation of her diverticulitis.  She denies fever, chills, nausea, vomiting, diarrhea, constipation, melena, hematochezia, dysuria, chest pain, or dyspnea.  Last bowel movement was earlier today and was normal.  HPI     Past Medical History:  Diagnosis Date  . Anemia   . Asthma   . Bipolar 1 disorder (HCC)   . Bronchitis   . COPD (chronic obstructive pulmonary disease) (HCC)   . Diverticulitis   . IBS (irritable bowel syndrome)   . Migraine   . Neuropathy   . Schizophrenia (HCC)   . Thyroid disease     Patient Active Problem List   Diagnosis Date Noted  . Lumbar spondylosis 06/21/2019  . Lumbar degenerative disc disease 06/21/2019  . Cervical facet joint syndrome 06/21/2019  . Sacroiliac joint pain 06/21/2019  . PTSD (post-traumatic stress disorder) 12/11/2016  . Bipolar 1 disorder, mixed, moderate (HCC) 08/27/2016  . Well woman exam with routine gynecological exam 06/15/2016  . COPD (chronic obstructive pulmonary disease) with acute bronchitis (HCC) 08/23/2015  . Acute  respiratory failure with hypoxia (HCC) 08/23/2015  . Sigmoid diverticulitis 08/03/2015  . Hypokalemia 08/03/2015  . Normocytic anemia 08/03/2015  . COPD with asthma (HCC)   . Thyroid activity decreased   . Bipolar disorder in partial remission (HCC)   . Bipolar 1 disorder (HCC) 02/14/2015  . Midline thoracic back pain 08/21/2014  . Abnormality of gait 08/21/2014  . Urinary urgency 08/21/2014  . ASTHMA 03/27/2008  . COLLES' FRACTURE, LEFT WRIST 03/27/2008    Past Surgical History:  Procedure Laterality Date  . ABDOMINAL HYSTERECTOMY    . broke left arm    . CESAREAN SECTION    . COLONOSCOPY N/A 08/30/2014   Procedure: COLONOSCOPY;  Surgeon: Malissa Hippo, MD;  Location: [facility];  Service: Endoscopy;  Laterality: N/A;  1200  . ELBOW SURGERY    . FOOT SURGERY    . KNEE SURGERY    . TONSILLECTOMY       OB History    Gravida  2   Para  1   Term  1   Preterm      AB  1   Living  1     SAB  1   TAB      Ectopic      Multiple      Live Births  1           Family History  Problem Relation Age of Onset  . Dementia Mother   . Bipolar disorder Mother   . Dementia Father   . Other Son        MVA accident  Social History   Tobacco Use  . Smoking status: Former Smoker    Packs/day: 4.00    Types: Cigarettes    Quit date: 07/06/2008    Years since quitting: 11.1  . Smokeless tobacco: Never Used  Substance Use Topics  . Alcohol use: No  . Drug use: No    Home Medications Prior to Admission medications   Medication Sig Start Date End Date Taking? Authorizing Provider  albuterol (PROVENTIL HFA;VENTOLIN HFA) 108 (90 Base) MCG/ACT inhaler Inhale 1-2 puffs into the lungs every 6 (six) hours as needed for wheezing or shortness of breath. 05/07/18   Long, Arlyss Repress, MD  aspirin 325 MG tablet Take 325 mg by mouth daily.    [provider]  budesonide (PULMICORT) 1 MG/2ML nebulizer solution Inhale into the lungs. 03/23/19 03/22/20  [provider]  budesonide-formoterol (SYMBICORT) 160-4.5 MCG/ACT inhaler Inhale 2 puffs into the lungs 2 (two) times daily. Patient not taking: Reported on 06/19/2019 03/25/14   Lorre Nick, MD  Calcium Carbonate (CALCIUM 600 PO) Take 1 tablet by mouth 2 (two) times daily.    [provider]  Cholecalciferol (VITAMIN D-1000 MAX ST) 25 MCG (1000 UT) tablet Take by mouth.    [provider]  ciprofloxacin (CIPRO) 500 MG tablet Take 1 tablet (500 mg total) by mouth 2 (two) times daily. Patient not taking: Reported on 06/19/2019 12/31/18   Pauline Aus, PA-C  diclofenac sodium (VOLTAREN) 1 % GEL SMARTSIG:2-4 Gram(s) Topical Daily 05/15/19   [provider]  dicyclomine (BENTYL) 10 MG capsule Take 1 capsule by mouth 3 (three) times daily. 12/05/18   [provider]  Dupilumab (DUPIXENT) 300 MG/2ML SOPN Inject into the skin. 04/13/19   [provider]  EPINEPHrine 0.3 mg/0.3 mL IJ SOAJ injection INJECT (0.3)ML INTO THEUMUSCLE ONCE AS NEEDED FORNANAPHYLAXIS FOR ONE DOSE. 05/23/19   [provider]  ferrous sulfate 220 (44 Fe) MG/5ML solution Take 220 mg by mouth 2 (two) times a day.    [provider]  fexofenadine (ALLEGRA) 180 MG tablet Take 180 mg by mouth daily.    [provider]  fluticasone (CUTIVATE) 0.05 % cream MIX WITH KETOCONAZOLE CREAM AND APPLY TO AFFECTED AREA UP TO TWICE DAILY AS NEEDED. Patient not taking: Reported on 06/19/2019 05/15/19   Adline Potter, NP  gabapentin (NEURONTIN) 300 MG capsule Take 1 capsule in the morning and take 2 capsules at night 08/08/19   Zena Amos, MD  ipratropium-albuterol (DUONEB) 0.5-2.5 (3) MG/3ML SOLN Inhale into the lungs. 03/23/19 03/17/20  [provider]  ketoconazole (NIZORAL) 2 % cream MIX WITH FLUTICASONE CREAM AND APPLY TO AFFECTED AREA UP TO TWICE DAILY AS NEEDED. 08/10/19   Adline Potter, NP  levETIRAcetam (KEPPRA) 750 MG tablet Take 1 tablet by mouth daily.  12/27/18   [provider]  levothyroxine (SYNTHROID) 50 MCG tablet Take 1 tablet by mouth daily. 12/27/18   [provider]  loratadine (CLARITIN) 10 MG tablet Take 10 mg by mouth daily.    [provider]  Magnesium 250 MG TABS Take by mouth.    [provider]  metoprolol tartrate (LOPRESSOR) 25 MG tablet Take 1 tablet by mouth 2 (two) times a day. 12/27/18   [provider]  metroNIDAZOLE (FLAGYL) 500 MG tablet Take 1 tablet (500 mg total) by mouth 2 (two) times daily. Patient not taking: Reported on 06/19/2019 12/31/18   Pauline Aus, PA-C  Multiple Vitamin (MULTIVITAMIN WITH MINERALS) TABS tablet Take 1  tablet by mouth daily.    [provider]  naproxen (NAPROSYN) 500 MG tablet Take 500 mg by mouth 2 (two) times daily with a meal.    [provider]  oxyCODONE-acetaminophen (PERCOCET/ROXICET) 5-325 MG tablet Take 1 tablet by mouth 3 (three) times daily as needed for pain. 12/07/18   [provider]  pantoprazole (PROTONIX) 40 MG tablet Take 1 tablet by mouth daily. 12/23/18   [provider]  PARoxetine (PAXIL) 30 MG tablet Take 1 tablet (30 mg total) by mouth daily. 08/08/19   [place] Crane, MD  Red Yeast Rice 600 MG CAPS Take 1 capsule by mouth 2 (two) times daily.    [provider]  Selenium 200 MCG TABS Take by mouth daily.    [provider]  simvastatin (ZOCOR) 40 MG tablet Take by mouth. 01/06/19   [provider]  sodium chloride (OCEAN) 0.65 % SOLN nasal spray Place 1 spray into both nostrils as needed for congestion.    [provider]  Donnal Debar 100-62.5-25 MCG/INH AEPB  08/16/18   [provider]  ziprasidone (GEODON) 60 MG capsule Take 1 capsule (60 mg total) by mouth 2 (two) times daily with a meal. 08/08/19   [place] Crane, MD    Allergies    Aminophylline, Sumatriptan, Theophylline, Codeine, Depakote [divalproex sodium], Divalproex sodium, Doxycycline,  Lamictal [lamotrigine], Lithium, Magnesium-containing compounds, Metronidazole, Nabumetone, Naproxen, Other, Penicillins, Pentazocine lactate, Risperidone, Septra ds [sulfamethoxazole-trimethoprim], Seroquel [quetiapine], Tegretol [carbamazepine], Clonidine derivatives, Tetracycline, and Zyprexa [olanzapine]  Review of Systems   Review of Systems  Constitutional: Negative for chills and fever.  Respiratory: Negative for shortness of breath.   Cardiovascular: Negative for chest pain.  Gastrointestinal: Positive for abdominal pain. Negative for blood in stool, constipation, diarrhea, nausea and vomiting.  Genitourinary: Negative for dysuria, vaginal bleeding and vaginal discharge.  Neurological: Negative for syncope.  All other systems reviewed and are negative.   Physical Exam Updated Vital Signs BP (!) 144/72 (BP Location: Right Arm)   Pulse 75   Temp 98.9 F (37.2 C) (Oral)   Resp 12   Ht 5\' 6"  (1.676 m)   Wt 82.6 kg   SpO2 96%   BMI 29.38 kg/m   Physical Exam Vitals and nursing note reviewed.  Constitutional:      General: She is not in acute distress.    Appearance: She is well-developed. She is not toxic-appearing.  HENT:     Head: Normocephalic and atraumatic.  Eyes:     General:        Right eye: No discharge.        Left eye: No discharge.     Conjunctiva/sclera: Conjunctivae normal.  Cardiovascular:     Rate and Rhythm: Normal rate and regular rhythm.  Pulmonary:     Effort: Pulmonary effort is normal. No respiratory distress.     Breath sounds: Normal breath sounds. No wheezing, rhonchi or rales.  Abdominal:     General: There is no distension.     Palpations: Abdomen is soft.     Tenderness: There is abdominal tenderness in the left lower quadrant. There is no right CVA tenderness, left CVA tenderness, guarding or rebound. Negative signs include McBurney's sign.  Musculoskeletal:     Cervical back: Neck supple.  Skin:    General: Skin is warm and dry.      Findings: No rash.  Neurological:     Mental Status: She is alert.     Comments: Clear speech.  Psychiatric:        Behavior: Behavior normal.     ED Results / Procedures / Treatments   Labs (all labs ordered are listed, but only abnormal results are displayed) Labs Reviewed  CBC  LIPASE, BLOOD  COMPREHENSIVE METABOLIC PANEL  URINALYSIS, ROUTINE W REFLEX MICROSCOPIC    EKG None  Radiology No results found.  Procedures Procedures (including critical care time)  Medications Ordered in ED Medications - No data to display  ED Course  I have reviewed the triage vital signs and the nursing notes.  Pertinent labs & imaging results that were available during my care of the patient were reviewed by me and considered in my medical decision making (see chart for details).    Kristen Ryan was evaluated in Emergency Department on 09/07/2019 for the symptoms described in the history of present illness. He/she was evaluated in the context of the global COVID-19 pandemic, which necessitated consideration that the patient might be at risk for infection with the SARS-CoV-2 virus that causes COVID-19. Institutional protocols and algorithms that pertain to the evaluation of patients at risk for COVID-19 are in a state of rapid change based on information released by regulatory bodies including the CDC and federal and state organizations. These policies and algorithms were followed during the patient's care in the ED.  MDM Rules/Calculators/A&P                     Patient presents to the emergency department with complaints of abdominal pain that began last night.  Nontoxic, resting comfortably, vitals WNL with exception of mildly elevated BP, doubt HTN emergency.  She states this feels similar to prior diverticulitis, on chart review for additional history she has confirmed diverticulitis on a CT scan 12/2018, here in the ED she is tender in the left lower quadrant but does not have any  peritoneal signs, fever, or leukocytosis.  She does not appear septic.  Based on her exam I have a low suspicion for abscess or perforation.  Given patient H&P with prior imaging feel it is reasonable to treat her for presumed diverticulitis with ciprofloxacin and Flagyl based on her allergies and discussion with the patient.  She does have a history of a Flagyl allergy documented but she states she does not recall any allergy to this and has taken it without difficulty. She has taken first dose of abx in the ED, feeling better s/p percocet, will discharge home. Abx would also cover for UTI given infected appearing urine, no urinary sxs. I discussed results, treatment plan, need for follow-up, and return precautions with the patient. Provided opportunity for questions, patient confirmed understanding and is in agreement with plan.   Findings and plan of care discussed with supervising physician Dr. Renaye Rakers who is in agreement.    Final Clinical Impression(s) / ED Diagnoses Final diagnoses:  Diverticulitis  Acute cystitis without hematuria    Rx / DC Orders ED Discharge Orders         Ordered    oxyCODONE-acetaminophen (PERCOCET/ROXICET) 5-325 MG tablet  Every 6 hours PRN     09/07/19 1839    ciprofloxacin (CIPRO) 500 MG tablet  Every 12 hours     09/07/19 1839    metroNIDAZOLE (FLAGYL) 500 MG tablet  3 times daily     09/07/19 1839           Cherly Anderson, PA-C 09/07/19 1842    Terald Sleeper, MD 09/08/19 1325

## 2019-09-10 LAB — URINE CULTURE: Culture: 10000 — AB

## 2019-09-11 ENCOUNTER — Telehealth: Payer: Self-pay

## 2019-09-11 NOTE — Telephone Encounter (Signed)
Post ED Visit - Positive Culture Follow-up  Culture report reviewed by antimicrobial stewardship pharmacist: [facility] []  , Pharm.D. [x]  Enzo Bi, .D., BCPS AQ-ID []  Celedonio Miyamoto, Pharm.D., BCPS []  [address], Pharm.D., BCPS []  [place], Garvin Fila.D., BCPS, AAHIVP []  , Pharm.D., BCPS, AAHIVP []  Georgina Pillion, PharmD, BCPS []  , PharmD, BCPS []  [place], PharmD, BCPS []  [address], PharmD []  , PharmD, BCPS []  Estella Husk, PharmD  Pharmacy Team []  Lysle Pearl, PharmD []  , PharmD []  Phillips Climes, PharmD []  , Rph []  Agapito Games) , PharmD []  Verlan Friends, PharmD []  , PharmD []  Mervyn Gay, PharmD []  , PharmD []  Vinnie Level, PharmD []  [facility], PharmD []  , PharmD []  Len Childs, PharmD   Positive urine culture Treated with Ciprofloxacin, organism sensitive to the same and no further patient follow-up is required at this time.  09/11/2019, 9:36 AM

## 2019-09-12 ENCOUNTER — Other Ambulatory Visit: Payer: Self-pay | Admitting: Adult Health

## 2019-10-09 ENCOUNTER — Encounter: Payer: Self-pay | Admitting: Psychiatry

## 2019-10-09 ENCOUNTER — Ambulatory Visit (INDEPENDENT_AMBULATORY_CARE_PROVIDER_SITE_OTHER): Payer: Medicare Other | Admitting: Psychiatry

## 2019-10-09 ENCOUNTER — Other Ambulatory Visit: Payer: Self-pay

## 2019-10-09 DIAGNOSIS — F3132 Bipolar disorder, current episode depressed, moderate: Secondary | ICD-10-CM | POA: Diagnosis not present

## 2019-10-09 DIAGNOSIS — F319 Bipolar disorder, unspecified: Secondary | ICD-10-CM

## 2019-10-09 DIAGNOSIS — F4310 Post-traumatic stress disorder, unspecified: Secondary | ICD-10-CM | POA: Diagnosis not present

## 2019-10-09 MED ORDER — GABAPENTIN 600 MG PO TABS: 600.0000 mg | ORAL_TABLET | Freq: Two times a day (BID) | ORAL | 0 refills | Status: DC

## 2019-10-09 MED ORDER — ZIPRASIDONE HCL 60 MG PO CAPS: 60.0000 mg | ORAL_CAPSULE | Freq: Two times a day (BID) | ORAL | 0 refills | Status: DC

## 2019-10-09 NOTE — Progress Notes (Signed)
BH MD OP Progress Note  Virtual Visit via Telephone Note  I connected with Kristen Ryan on 10/09/19 at  2:00 PM EDT by telephone and verified that I am speaking with the correct person using two identifiers.  I discussed the limitations, risks, security and privacy concerns of performing an evaluation and management service by telephone and the availability of in person appointments. I also discussed with the patient that there may be a patient responsible charge related to this service. The patient expressed understanding and agreed to proceed.   10/09/2019 1:54 PM Kristen Ryan  MRN:  [id number]  Chief Complaint:  " I have been feeling depressed."  HPI: Patient reported that she has been feeling quite depressed lately.  She stated that she is not able to sleep well.  She complained of her hands and feet shaking constantly.  She stated that she is quite fidgety and cannot stay still.  She informed that she feels uneasy and somewhat paranoid when she goes out.  She stays inside her house and hardly gets any sunshine.  She informed that she was seen by her PCP a few days ago and her PCP was very concerned about her ongoing depression symptoms so discontinued her Paxil and switched her to Trintellix 10 mg daily.  She informed that she has been taking it for last 6 days now.  She was asked if she tapered off her Paxil or to discontinue it immediately, she informed that she discontinued it completely and as result had withdrawals for 3 or 4 days.  She is not having any more withdrawals anymore.  She has been able to tell a big difference after starting the Trintellix. She also reported being in a lot of pain which probably is adding to her depression.  She stated that she is supposed to be starting physical therapy soon in the next few days. Patient was asked how she felt with gabapentin dose increase, she replied that increasing the dose of gabapentin did help with the restlessness feeling in her  legs. She was asked questions to clarify if she is having akathisia, she complained of constant restless sensation in her extremities that improved some with gabapentin.  She is already taking metoprolol 12.5 mg twice daily for hypertension, therefore cannot start her on propanolol. She was agreeable to increasing the dose of gabapentin as it did help her some. Patient was recommended to continue Trintellix at the same dose for now as the dose was started less than a week ago and we can reassess how she is doing in 6 weeks.   Visit Diagnosis:  No diagnosis found.  Past Psychiatric History: Bipolar 1 disorder, PTSD  Past Medical History:  Past Medical History:  Diagnosis Date  . Anemia   . Asthma   . Bipolar 1 disorder (HCC)   . Bronchitis   . COPD (chronic obstructive pulmonary disease) (HCC)   . Diverticulitis   . IBS (irritable bowel syndrome)   . Migraine   . Neuropathy   . Schizophrenia (HCC)   . Thyroid disease     Past Surgical History:  Procedure Laterality Date  . ABDOMINAL HYSTERECTOMY    . broke left arm    . CESAREAN SECTION    . COLONOSCOPY N/A 08/30/2014   Procedure: COLONOSCOPY;  Surgeon: Malissa Hippo, MD;  Location: [facility];  Service: Endoscopy;  Laterality: N/A;  1200  . ELBOW SURGERY    . FOOT SURGERY    . KNEE SURGERY    .  TONSILLECTOMY      Family Psychiatric History: see below  Family History:  Family History  Problem Relation Age of Onset  . Dementia Mother   . Bipolar disorder Mother   . Dementia Father   . Other Son        MVA accident    Social History:  Social History   Socioeconomic History  . Marital status: Married    Spouse name: Not on file  . Number of children: Not on file  . Years of education: Not on file  . Highest education level: Not on file  Occupational History  . Not on file  Tobacco Use  . Smoking status: Former Smoker    Packs/day: 4.00    Types: Cigarettes    Quit date: 07/06/2008    Years since  quitting: 11.2  . Smokeless tobacco: Never Used  Substance and Sexual Activity  . Alcohol use: No  . Drug use: No  . Sexual activity: Yes    Birth control/protection: Surgical    Comment: hyst  Other Topics Concern  . Not on file  Social History Narrative  . Not on file   Social Determinants of Health   Financial Resource Strain:   . Difficulty of Paying Living Expenses:   Food Insecurity:   . Worried About Programme researcher, broadcasting/film/video in the Last Year:   . Barista in the Last Year:   Transportation Needs:   . Freight forwarder (Medical):   Marland Kitchen Lack of Transportation (Non-Medical):   Physical Activity:   . Days of Exercise per Week:   . Minutes of Exercise per Session:   Stress:   . Feeling of Stress :   Social Connections:   . Frequency of Communication with Friends and Family:   . Frequency of Social Gatherings with Friends and Family:   . Attends Religious Services:   . Active Member of Clubs or Organizations:   . Attends Banker Meetings:   Marland Kitchen Marital Status:     Allergies:  Allergies  Allergen Reactions  . Aminophylline Anaphylaxis  . Sumatriptan Anaphylaxis and Other (See Comments)    Causes fainting  . Theophylline Anaphylaxis  . Codeine Itching and Other (See Comments)    Too strong for patient   . Depakote [Divalproex Sodium] Other (See Comments)    Hair loss  . Divalproex Sodium Other (See Comments)    Causes hair to fall out  . Doxycycline Other (See Comments)    headache  . Lamictal [Lamotrigine]     Destroys thyroid  . Lithium Other (See Comments)    Adverse reaction to Thyroid   . Magnesium-Containing Compounds Hives  . Metronidazole   . Nabumetone Swelling  . Naproxen Swelling  . Other     Hair dye  . Penicillins Nausea And Vomiting    Has patient had a PCN reaction causing immediate rash, facial/tongue/throat swelling, SOB or lightheadedness with hypotension: Yes Has patient had a PCN reaction causing severe rash involving  mucus membranes or skin necrosis: No Has patient had a PCN reaction that required hospitalization Yes Has patient had a PCN reaction occurring within the last 10 years: No If all of the above answers are "NO", then may proceed with Cephalosporin use.   Marland Kitchen Pentazocine Lactate Other (See Comments)    Unknown reaction  . Risperidone Other (See Comments)    Insomnia   . Septra Ds [Sulfamethoxazole-Trimethoprim]   . Seroquel [Quetiapine] Swelling  . Tegretol [Carbamazepine] Swelling  .  Clonidine Derivatives     Dizziness at 0.1 mg  . Tetracycline Rash    Headaches  . Zyprexa [Olanzapine] Rash and Other (See Comments)    Insomnia    Metabolic Disorder Labs: No results found for: HGBA1C, MPG No results found for: PROLACTIN No results found for: CHOL, TRIG, HDL, CHOLHDL, VLDL, LDLCALC Lab Results  Component Value Date   TSH 0.888 07/07/2010    Therapeutic Level Labs: Lab Results  Component Value Date   LITHIUM 1.27 10/25/2013   No results found for: VALPROATE No components found for:  CBMZ  Current Medications: Current Outpatient Medications  Medication Sig Dispense Refill  . albuterol (PROVENTIL HFA;VENTOLIN HFA) 108 (90 Base) MCG/ACT inhaler Inhale 1-2 puffs into the lungs every 6 (six) hours as needed for wheezing or shortness of breath. 1 Inhaler 0  . aspirin 325 MG tablet Take 325 mg by mouth daily.    . budesonide (PULMICORT) 1 MG/2ML nebulizer solution Inhale into the lungs.    . Calcium Carbonate (CALCIUM 600 PO) Take 1 tablet by mouth 2 (two) times daily.    . Cholecalciferol (VITAMIN D-1000 MAX ST) 25 MCG (1000 UT) tablet Take by mouth.    . ciprofloxacin (CIPRO) 500 MG tablet Take 1 tablet (500 mg total) by mouth every 12 (twelve) hours. 14 tablet 0  . diclofenac sodium (VOLTAREN) 1 % GEL SMARTSIG:2-4 Gram(s) Topical Daily    . dicyclomine (BENTYL) 10 MG capsule Take 1 capsule by mouth 3 (three) times daily.    . Dupilumab (DUPIXENT) 300 MG/2ML SOPN Inject into the  skin.    Marland Kitchen EPINEPHrine 0.3 mg/0.3 mL IJ SOAJ injection INJECT (0.3)ML INTO THEUMUSCLE ONCE AS NEEDED FORNANAPHYLAXIS FOR ONE DOSE.    . ferrous sulfate 220 (44 Fe) MG/5ML solution Take 220 mg by mouth 2 (two) times a day.    . fexofenadine (ALLEGRA) 180 MG tablet Take 180 mg by mouth daily.    Marland Kitchen gabapentin (NEURONTIN) 300 MG capsule Take 1 capsule in the morning and take 2 capsules at night 270 capsule 0  . ipratropium-albuterol (DUONEB) 0.5-2.5 (3) MG/3ML SOLN Inhale into the lungs.    Marland Kitchen ketoconazole (NIZORAL) 2 % cream MIX WITH FLUTICASONE CREAM AND APPLY TO AFFECTED AREA UP TO TWICE DAILY AS NEEDED. 30 g 0  . levETIRAcetam (KEPPRA) 750 MG tablet Take 1 tablet by mouth daily.    Marland Kitchen levothyroxine (SYNTHROID) 50 MCG tablet Take 1 tablet by mouth daily.    Marland Kitchen loratadine (CLARITIN) 10 MG tablet Take 10 mg by mouth daily.    . Magnesium 250 MG TABS Take by mouth.    . metoprolol tartrate (LOPRESSOR) 25 MG tablet Take 1 tablet by mouth 2 (two) times a day.    . metroNIDAZOLE (FLAGYL) 500 MG tablet Take 1 tablet (500 mg total) by mouth 3 (three) times daily. 21 tablet 0  . Multiple Vitamin (MULTIVITAMIN WITH MINERALS) TABS tablet Take 1 tablet by mouth daily.    . naproxen (NAPROSYN) 500 MG tablet Take 500 mg by mouth 2 (two) times daily with a meal.    . oxyCODONE-acetaminophen (PERCOCET/ROXICET) 5-325 MG tablet Take 1-2 tablets by mouth every 6 (six) hours as needed for severe pain. 7 tablet 0  . pantoprazole (PROTONIX) 40 MG tablet Take 1 tablet by mouth daily.    Marland Kitchen PARoxetine (PAXIL) 30 MG tablet Take 1 tablet (30 mg total) by mouth daily. 90 tablet 0  . Red Yeast Rice 600 MG CAPS Take 1 capsule by mouth 2 (  two) times daily.    . Selenium 200 MCG TABS Take by mouth daily.    . simvastatin (ZOCOR) 40 MG tablet Take by mouth.    . sodium chloride (OCEAN) 0.65 % SOLN nasal spray Place 1 spray into both nostrils as needed for congestion.    . TRELEGY ELLIPTA 100-62.5-25 MCG/INH AEPB     . ziprasidone  (GEODON) 60 MG capsule Take 1 capsule (60 mg total) by mouth 2 (two) times daily with a meal. 180 capsule 0   No current facility-administered medications for this visit.      Psychiatric Specialty Exam: Review of Systems  There were no vitals taken for this visit.There is no height or weight on file to calculate BMI.  General Appearance: unable to assess due to phone visit  Eye Contact:  unable to assess due to phone visit  Speech:  Clear and Coherent and Normal Rate  Volume:  Normal  Mood:  " Not so good"  Affect:  Congruent  Thought Process:  Goal Directed, Linear and Descriptions of Associations: Intact  Orientation:  Full (Time, Place, and Person)  Thought Content: Logical   Suicidal Thoughts:  No  Homicidal Thoughts:  No  Memory:  Recent;   Good Remote;   Good  Judgement:  Good  Insight:  Good  Psychomotor Activity:  Normal  Concentration:  Concentration: Good and Attention Span: Good  Recall:  Good  Fund of Knowledge: Good  Language: Good  Akathisia:  Negative  Handed:  Right  AIMS (if indicated): not done  Assets:  Communication Skills Desire for Improvement Financial Resources/Insurance Housing  ADL's:  Intact  Cognition: WNL  Sleep:  Fair     Screenings: PHQ2-9     Office Visit from 07/28/2016 in [place] Office Visit from 06/15/2016 in [facility]  PHQ-2 Total Score  6  6  PHQ-9 Total Score  14  14       Assessment and Plan: 64 y.o. year old female with a history of bipolar I disorder, PTSD, COPD, IBS, neuropathy , who was contacted via phone for follow-up.  Patient continues to complain of depressive symptoms and constant restlessness feeling in her extremities suggestive of akathisia.  Increasing the dose of gabapentin helped her partially.  She is already taking metoprolol for hypertension, cannot add propanolol.  Patient was agreeable to going up on the dose of gabapentin to see if that helps.  She was recently taken off of the Paxil  and switched to Trintellix 10 mg less than a week ago by her PCP.  1. Bipolar 1 disorder (HCC)  - Continue Trintellix 10 mg daily - ziprasidone (GEODON) 60 MG capsule; Take 1 capsule (60 mg total) by mouth 2 (two) times daily with a meal.  Dispense: 180 capsule; Refill: 0 - Increase gabapentin (NEURONTIN) 600 mg BID  2. PTSD (post-traumatic stress disorder) - Continue Trintellix 10 mg daily   F/up in 6 weeks.   Nevada Crane, MD 10/09/2019, 1:54 PM

## 2019-10-12 ENCOUNTER — Other Ambulatory Visit: Payer: Self-pay | Admitting: Adult Health

## 2019-11-21 ENCOUNTER — Telehealth (INDEPENDENT_AMBULATORY_CARE_PROVIDER_SITE_OTHER): Payer: Medicare Other | Admitting: Psychiatry

## 2019-11-21 ENCOUNTER — Encounter: Payer: Self-pay | Admitting: Psychiatry

## 2019-11-21 ENCOUNTER — Other Ambulatory Visit: Payer: Self-pay

## 2019-11-21 DIAGNOSIS — F4310 Post-traumatic stress disorder, unspecified: Secondary | ICD-10-CM

## 2019-11-21 DIAGNOSIS — F3132 Bipolar disorder, current episode depressed, moderate: Secondary | ICD-10-CM | POA: Diagnosis not present

## 2019-11-21 MED ORDER — ZIPRASIDONE HCL 60 MG PO CAPS: 60.0000 mg | ORAL_CAPSULE | Freq: Two times a day (BID) | ORAL | 0 refills | Status: DC

## 2019-11-21 MED ORDER — VORTIOXETINE HBR 10 MG PO TABS: 10.0000 mg | ORAL_TABLET | Freq: Every day | ORAL | 1 refills | Status: DC

## 2019-11-21 MED ORDER — GABAPENTIN 600 MG PO TABS: 600.0000 mg | ORAL_TABLET | Freq: Two times a day (BID) | ORAL | 0 refills | Status: DC

## 2019-11-21 NOTE — Progress Notes (Signed)
BH MD OP Progress Note  Virtual Visit via Telephone Note  I connected with Kristen Ryan on 11/21/19 at  3:00 PM EDT by telephone and verified that I am speaking with the correct person using two identifiers.  Location: Patient: home Provider: Clinic   I discussed the limitations, risks, security and privacy concerns of performing an evaluation and management service by telephone and the availability of in person appointments. I also discussed with the patient that there may be a patient responsible charge related to this service. The patient expressed understanding and agreed to proceed.   I provided 12 minutes of non-face-to-face time during this encounter.    11/21/2019 3:36 PM Kristen Ryan  MRN:  [id number]  Chief Complaint:  " I'm doing well."  HPI: Patient reported that she has been doing well overall. She notes that  Trintellix has been effective in managing her depressive symptoms and Geodon has been effective in managing her mood. She has also notices improvements with the restless feeling in her legs while on Gabapentin. She reports that she is on oxygen 24/7 which she reports is depressive, however she is insightful and reports that she is grateful that she is not hospitalized and able to do most things for herself. She endorses adequate sleep.   She notes that she has chronic pain and is being seen by pain management who request a letter from psychiatry regarding potential for abuse. Patient informed that letter would be written. No medication adjustment requested at this time. She is agreeable to continue current regimen. No other concerns noted at this time.      Visit Diagnosis:    ICD-10-CM   1. Bipolar 1 disorder, depressed, moderate (HCC)  F31.32   2. PTSD (post-traumatic stress disorder)  F43.10     Past Psychiatric History: Bipolar 1 disorder, PTSD  Past Medical History:  Past Medical History:  Diagnosis Date  . Anemia   . Asthma   . Bipolar 1  disorder (HCC)   . Bronchitis   . COPD (chronic obstructive pulmonary disease) (HCC)   . Diverticulitis   . IBS (irritable bowel syndrome)   . Migraine   . Neuropathy   . Schizophrenia (HCC)   . Thyroid disease     Past Surgical History:  Procedure Laterality Date  . ABDOMINAL HYSTERECTOMY    . broke left arm    . CESAREAN SECTION    . COLONOSCOPY N/A 08/30/2014   Procedure: COLONOSCOPY;  Surgeon: Malissa Hippo, MD;  Location: [facility];  Service: Endoscopy;  Laterality: N/A;  1200  . ELBOW SURGERY    . FOOT SURGERY    . KNEE SURGERY    . TONSILLECTOMY      Family Psychiatric History: see below  Family History:  Family History  Problem Relation Age of Onset  . Dementia Mother   . Bipolar disorder Mother   . Dementia Father   . Other Son        MVA accident    Social History:  Social History   Socioeconomic History  . Marital status: Married    Spouse name: Not on file  . Number of children: Not on file  . Years of education: Not on file  . Highest education level: Not on file  Occupational History  . Not on file  Tobacco Use  . Smoking status: Former Smoker    Packs/day: 4.00    Types: Cigarettes    Quit date: 07/06/2008    Years  since quitting: 11.3  . Smokeless tobacco: Never Used  Substance and Sexual Activity  . Alcohol use: No  . Drug use: No  . Sexual activity: Yes    Birth control/protection: Surgical    Comment: hyst  Other Topics Concern  . Not on file  Social History Narrative  . Not on file   Social Determinants of Health   Financial Resource Strain:   . Difficulty of Paying Living Expenses:   Food Insecurity:   . Worried About Programme researcher, broadcasting/film/video in the Last Year:   . Barista in the Last Year:   Transportation Needs:   . Freight forwarder (Medical):   Marland Kitchen Lack of Transportation (Non-Medical):   Physical Activity:   . Days of Exercise per Week:   . Minutes of Exercise per Session:   Stress:   . Feeling of Stress :    Social Connections:   . Frequency of Communication with Friends and Family:   . Frequency of Social Gatherings with Friends and Family:   . Attends Religious Services:   . Active Member of Clubs or Organizations:   . Attends Banker Meetings:   Marland Kitchen Marital Status:     Allergies:  Allergies  Allergen Reactions  . Aminophylline Anaphylaxis  . Sumatriptan Anaphylaxis and Other (See Comments)    Causes fainting  . Theophylline Anaphylaxis  . Codeine Itching and Other (See Comments)    Too strong for patient   . Depakote [Divalproex Sodium] Other (See Comments)    Hair loss  . Divalproex Sodium Other (See Comments)    Causes hair to fall out  . Doxycycline Other (See Comments)    headache  . Lamictal [Lamotrigine]     Destroys thyroid  . Lithium Other (See Comments)    Adverse reaction to Thyroid   . Magnesium-Containing Compounds Hives  . Metronidazole   . Nabumetone Swelling  . Naproxen Swelling  . Other     Hair dye  . Penicillins Nausea And Vomiting    Has patient had a PCN reaction causing immediate rash, facial/tongue/throat swelling, SOB or lightheadedness with hypotension: Yes Has patient had a PCN reaction causing severe rash involving mucus membranes or skin necrosis: No Has patient had a PCN reaction that required hospitalization Yes Has patient had a PCN reaction occurring within the last 10 years: No If all of the above answers are "NO", then may proceed with Cephalosporin use.   Marland Kitchen Pentazocine Lactate Other (See Comments)    Unknown reaction  . Risperidone Other (See Comments)    Insomnia   . Septra Ds [Sulfamethoxazole-Trimethoprim]   . Seroquel [Quetiapine] Swelling  . Tegretol [Carbamazepine] Swelling  . Clonidine Derivatives     Dizziness at 0.1 mg  . Tetracycline Rash    Headaches  . Zyprexa [Olanzapine] Rash and Other (See Comments)    Insomnia    Metabolic Disorder Labs: No results found for: HGBA1C, MPG No results found for:  PROLACTIN No results found for: CHOL, TRIG, HDL, CHOLHDL, VLDL, LDLCALC Lab Results  Component Value Date   TSH 0.888 07/07/2010    Therapeutic Level Labs: Lab Results  Component Value Date   LITHIUM 1.27 10/25/2013   No results found for: VALPROATE No components found for:  CBMZ  Current Medications: Current Outpatient Medications  Medication Sig Dispense Refill  . albuterol (PROVENTIL HFA;VENTOLIN HFA) 108 (90 Base) MCG/ACT inhaler Inhale 1-2 puffs into the lungs every 6 (six) hours as needed for wheezing or  shortness of breath. 1 Inhaler 0  . aspirin 325 MG tablet Take 325 mg by mouth daily.    . budesonide (PULMICORT) 1 MG/2ML nebulizer solution Inhale into the lungs.    . Calcium Carbonate (CALCIUM 600 PO) Take 1 tablet by mouth 2 (two) times daily.    . Cholecalciferol (VITAMIN D-1000 MAX ST) 25 MCG (1000 UT) tablet Take by mouth.    . ciprofloxacin (CIPRO) 500 MG tablet Take 1 tablet (500 mg total) by mouth every 12 (twelve) hours. 14 tablet 0  . diclofenac sodium (VOLTAREN) 1 % GEL SMARTSIG:2-4 Gram(s) Topical Daily    . dicyclomine (BENTYL) 10 MG capsule Take 1 capsule by mouth 3 (three) times daily.    . Dupilumab (DUPIXENT) 300 MG/2ML SOPN Inject into the skin.    Marland Kitchen EPINEPHrine 0.3 mg/0.3 mL IJ SOAJ injection INJECT (0.3)ML INTO THEUMUSCLE ONCE AS NEEDED FORNANAPHYLAXIS FOR ONE DOSE.    . ferrous sulfate 220 (44 Fe) MG/5ML solution Take 220 mg by mouth 2 (two) times a day.    . fexofenadine (ALLEGRA) 180 MG tablet Take 180 mg by mouth daily.    Marland Kitchen gabapentin (NEURONTIN) 600 MG tablet Take 1 tablet (600 mg total) by mouth 2 (two) times daily. 180 tablet 0  . ipratropium-albuterol (DUONEB) 0.5-2.5 (3) MG/3ML SOLN Inhale into the lungs.    Marland Kitchen ketoconazole (NIZORAL) 2 % cream MIX WITH FLUTICASONE CREAM AND APPLY TO AFFECTED AREA UP TO TWICE DAILY AS NEEDED. 30 g 0  . levETIRAcetam (KEPPRA) 750 MG tablet Take 1 tablet by mouth daily.    Marland Kitchen levothyroxine (SYNTHROID) 50 MCG tablet  Take 1 tablet by mouth daily.    Marland Kitchen loratadine (CLARITIN) 10 MG tablet Take 10 mg by mouth daily.    . Magnesium 250 MG TABS Take by mouth.    . metoprolol tartrate (LOPRESSOR) 25 MG tablet Take 0.5 tablets by mouth 2 (two) times a day.    . metroNIDAZOLE (FLAGYL) 500 MG tablet Take 1 tablet (500 mg total) by mouth 3 (three) times daily. 21 tablet 0  . Multiple Vitamin (MULTIVITAMIN WITH MINERALS) TABS tablet Take 1 tablet by mouth daily.    . naproxen (NAPROSYN) 500 MG tablet Take 500 mg by mouth 2 (two) times daily with a meal.    . oxyCODONE-acetaminophen (PERCOCET/ROXICET) 5-325 MG tablet Take 1-2 tablets by mouth every 6 (six) hours as needed for severe pain. 7 tablet 0  . pantoprazole (PROTONIX) 40 MG tablet Take 1 tablet by mouth daily.    . Red Yeast Rice 600 MG CAPS Take 1 capsule by mouth 2 (two) times daily.    . Selenium 200 MCG TABS Take by mouth daily.    . simvastatin (ZOCOR) 40 MG tablet Take by mouth.    . sodium chloride (OCEAN) 0.65 % SOLN nasal spray Place 1 spray into both nostrils as needed for congestion.    . TRELEGY ELLIPTA 100-62.5-25 MCG/INH AEPB     . vortioxetine HBr (TRINTELLIX) 10 MG TABS tablet Take 10 mg by mouth daily.    . ziprasidone (GEODON) 60 MG capsule Take 1 capsule (60 mg total) by mouth 2 (two) times daily with a meal. 180 capsule 0   No current facility-administered medications for this visit.      Psychiatric Specialty Exam: Review of Systems  There were no vitals taken for this visit.There is no height or weight on file to calculate BMI.  General Appearance: unable to assess due to phone visit  Eye Contact:  unable to assess due to phone visit  Speech:  Clear and Coherent and Normal Rate  Volume:  Normal  Mood:  " Not so good"  Affect:  Congruent  Thought Process:  Goal Directed, Linear and Descriptions of Associations: Intact  Orientation:  Full (Time, Place, and Person)  Thought Content: Logical   Suicidal Thoughts:  No  Homicidal  Thoughts:  No  Memory:  Recent;   Good Remote;   Good  Judgement:  Good  Insight:  Good  Psychomotor Activity:  Normal  Concentration:  Concentration: Good and Attention Span: Good  Recall:  Good  Fund of Knowledge: Good  Language: Good  Akathisia:  Negative  Handed:  Right  AIMS (if indicated): not done  Assets:  Communication Skills Desire for Improvement Financial Resources/Insurance Housing  ADL's:  Intact  Cognition: WNL  Sleep:  Fair     Screenings: PHQ2-9     Office Visit from 07/28/2016 in [facility] Office Visit from 06/15/2016 in [facility]  PHQ-2 Total Score  6  6  PHQ-9 Total Score  14  14       Assessment and Plan: Patient reports that she is doing well on current medication regamen. Her mood and depressive symptoms are well managed on Geodon and  Trintellix. She notes that gabapentin has been helpful in reducing restlessness in her legs. She is agreeable to continue current medication regimen.    1. Bipolar 1 disorder, depressed, moderate (HCC)  - gabapentin (NEURONTIN) 600 MG tablet; Take 1 tablet (600 mg total) by mouth 2 (two) times daily.  Dispense: 180 tablet; Refill: 0 - ziprasidone (GEODON) 60 MG capsule; Take 1 capsule (60 mg total) by mouth 2 (two) times daily with a meal.  Dispense: 180 capsule; Refill: 0  2. PTSD (post-traumatic stress disorder)  - vortioxetine HBr (TRINTELLIX) 10 MG TABS tablet; Take 1 tablet (10 mg total) by mouth daily.  Dispense: 30 tablet; Refill: 1  Pt requested letter stating that she is at low risk for opioid prescription from her new pain management provider in [place] with [facility]. Continue Medications as prescribed Follow up in 3 months.    Toy Cookey, DNP, PMHNP-BC 11/21/2019    I spoke with patient and managed the patient with NP B. Doyne Keel. Letter for pain management was issued as per pt's request.  Zena Amos, MD 11/21/2019 3:46 PM

## 2019-11-22 ENCOUNTER — Encounter: Payer: Self-pay | Admitting: Psychiatry

## 2019-12-08 ENCOUNTER — Telehealth (HOSPITAL_COMMUNITY): Payer: Self-pay | Admitting: *Deleted

## 2019-12-08 NOTE — Telephone Encounter (Signed)
It was issued on May 18 and sent out to her pain management provider.

## 2019-12-08 NOTE — Telephone Encounter (Signed)
Patient called stated she requested a letter last week for Pain Mgmt. Clinic stating that her psych med's  will not interfere with her pain mgmt. Med's.  Patient has appointment coming up on next Thursday  12/14/19 @ pain mgmt clinic.

## 2019-12-08 NOTE — Telephone Encounter (Signed)
I went to the [facility] on [address] to get Envelopes since new facility supplies haven't arrived completely. And then went to [employer] to make sure Patient would hopefully receive before her Thursday if sent by [employer] Postal [employer]. Patient notified & very Thankful

## 2020-02-14 ENCOUNTER — Telehealth: Payer: Medicare Other | Admitting: Psychiatry

## 2020-02-15 ENCOUNTER — Telehealth (INDEPENDENT_AMBULATORY_CARE_PROVIDER_SITE_OTHER): Payer: Medicare Other | Admitting: Psychiatry

## 2020-02-15 ENCOUNTER — Encounter (HOSPITAL_COMMUNITY): Payer: Self-pay | Admitting: Psychiatry

## 2020-02-15 ENCOUNTER — Other Ambulatory Visit: Payer: Self-pay

## 2020-02-15 DIAGNOSIS — F3132 Bipolar disorder, current episode depressed, moderate: Secondary | ICD-10-CM | POA: Insufficient documentation

## 2020-02-15 DIAGNOSIS — F4310 Post-traumatic stress disorder, unspecified: Secondary | ICD-10-CM

## 2020-02-15 HISTORY — DX: Bipolar disorder, current episode depressed, moderate: F31.32

## 2020-02-15 MED ORDER — VORTIOXETINE HBR 20 MG PO TABS: 20.0000 mg | ORAL_TABLET | Freq: Every day | ORAL | 0 refills | Status: DC

## 2020-02-15 MED ORDER — ZIPRASIDONE HCL 60 MG PO CAPS: 60.0000 mg | ORAL_CAPSULE | Freq: Two times a day (BID) | ORAL | 0 refills | Status: DC

## 2020-02-15 MED ORDER — GABAPENTIN 600 MG PO TABS: 600.0000 mg | ORAL_TABLET | Freq: Two times a day (BID) | ORAL | 0 refills | Status: DC

## 2020-02-15 NOTE — Progress Notes (Signed)
BH MD OP Progress Note  Virtual Visit via Telephone Note  I connected with Kristen Ryan on 02/15/20 at 11:40 AM EDT by telephone and verified that I am speaking with the correct person using two identifiers.  Location: Patient: home Provider: Clinic   I discussed the limitations, risks, security and privacy concerns of performing an evaluation and management service by telephone and the availability of in person appointments. I also discussed with the patient that there may be a patient responsible charge related to this service. The patient expressed understanding and agreed to proceed.   I provided 15 minutes of non-face-to-face time during this encounter.    02/15/2020 11:19 AM Kristen Ryan  MRN:  [id number]  Chief Complaint:  " I have been feeling so depressed."  HPI: Patient informed that she had started noticing that she has been very depressed for the past few days.  She stated that she has been crying easily and gets upset very fast.  She stated that she does not feel like doing anything and her appetite is also poor.  She stated that she feels tired all the time.  She stated that she knew that she was going to be talking to the writer today therefore she started taking 2 tablets of Trintellix starting yesterday morning.  Writer recommended that we continue with the higher dose of Trintellix for optimal control of depressive symptoms. She also mentioned that for some reason the pharmacy gave her 2 tablets of 300 mg instead of one 600 mg capsule to her gabapentin.  She stated that taking one 600 mg capsule is much easier than taking to 300 mg tablets.  Writer informed her that prescriptions were sent for 600 mg capsule and will be the sent the same way this time as well.  Visit Diagnosis:    ICD-10-CM   1. Bipolar 1 disorder, depressed, moderate (HCC)  F31.32   2. PTSD (post-traumatic stress disorder)  F43.10     Past Psychiatric History: Bipolar 1 disorder, PTSD  Past  Medical History:  Past Medical History:  Diagnosis Date  . Anemia   . Asthma   . Bipolar 1 disorder (HCC)   . Bronchitis   . COPD (chronic obstructive pulmonary disease) (HCC)   . Diverticulitis   . IBS (irritable bowel syndrome)   . Migraine   . Neuropathy   . Schizophrenia (HCC)   . Thyroid disease     Past Surgical History:  Procedure Laterality Date  . ABDOMINAL HYSTERECTOMY    . broke left arm    . CESAREAN SECTION    . COLONOSCOPY N/A 08/30/2014   Procedure: COLONOSCOPY;  Surgeon: Malissa HippoNajeeb U Rehman, MD;  Location: [facility];  Service: Endoscopy;  Laterality: N/A;  1200  . ELBOW SURGERY    . FOOT SURGERY    . KNEE SURGERY    . TONSILLECTOMY      Family Psychiatric History: see below  Family History:  Family History  Problem Relation Age of Onset  . Dementia Mother   . Bipolar disorder Mother   . Dementia Father   . Other Son        MVA accident    Social History:  Social History   Socioeconomic History  . Marital status: Married    Spouse name: Not on file  . Number of children: Not on file  . Years of education: Not on file  . Highest education level: Not on file  Occupational History  . Not on  file  Tobacco Use  . Smoking status: Former Smoker    Packs/day: 4.00    Types: Cigarettes    Quit date: 07/06/2008    Years since quitting: 11.6  . Smokeless tobacco: Never Used  Vaping Use  . Vaping Use: Never used  Substance and Sexual Activity  . Alcohol use: No  . Drug use: No  . Sexual activity: Yes    Birth control/protection: Surgical    Comment: hyst  Other Topics Concern  . Not on file  Social History Narrative  . Not on file   Social Determinants of Health   Financial Resource Strain:   . Difficulty of Paying Living Expenses:   Food Insecurity:   . Worried About Programme researcher, broadcasting/film/video in the Last Year:   . Barista in the Last Year:   Transportation Needs:   . Freight forwarder (Medical):   Marland Kitchen Lack of Transportation  (Non-Medical):   Physical Activity:   . Days of Exercise per Week:   . Minutes of Exercise per Session:   Stress:   . Feeling of Stress :   Social Connections:   . Frequency of Communication with Friends and Family:   . Frequency of Social Gatherings with Friends and Family:   . Attends Religious Services:   . Active Member of Clubs or Organizations:   . Attends Banker Meetings:   Marland Kitchen Marital Status:     Allergies:  Allergies  Allergen Reactions  . Aminophylline Anaphylaxis  . Sumatriptan Anaphylaxis and Other (See Comments)    Causes fainting  . Theophylline Anaphylaxis  . Codeine Itching and Other (See Comments)    Too strong for patient   . Depakote [Divalproex Sodium] Other (See Comments)    Hair loss  . Divalproex Sodium Other (See Comments)    Causes hair to fall out  . Doxycycline Other (See Comments)    headache  . Lamictal [Lamotrigine]     Destroys thyroid  . Lithium Other (See Comments)    Adverse reaction to Thyroid   . Magnesium-Containing Compounds Hives  . Metronidazole   . Nabumetone Swelling  . Naproxen Swelling  . Other     Hair dye  . Penicillins Nausea And Vomiting    Has patient had a PCN reaction causing immediate rash, facial/tongue/throat swelling, SOB or lightheadedness with hypotension: Yes Has patient had a PCN reaction causing severe rash involving mucus membranes or skin necrosis: No Has patient had a PCN reaction that required hospitalization Yes Has patient had a PCN reaction occurring within the last 10 years: No If all of the above answers are "NO", then may proceed with Cephalosporin use.   Marland Kitchen Pentazocine Lactate Other (See Comments)    Unknown reaction  . Risperidone Other (See Comments)    Insomnia   . Septra Ds [Sulfamethoxazole-Trimethoprim]   . Seroquel [Quetiapine] Swelling  . Tegretol [Carbamazepine] Swelling  . Clonidine Derivatives     Dizziness at 0.1 mg  . Tetracycline Rash    Headaches  . Zyprexa  [Olanzapine] Rash and Other (See Comments)    Insomnia    Metabolic Disorder Labs: No results found for: HGBA1C, MPG No results found for: PROLACTIN No results found for: CHOL, TRIG, HDL, CHOLHDL, VLDL, LDLCALC Lab Results  Component Value Date   TSH 0.888 07/07/2010    Therapeutic Level Labs: Lab Results  Component Value Date   LITHIUM 1.27 10/25/2013   No results found for: VALPROATE No components found for:  CBMZ  Current Medications: Current Outpatient Medications  Medication Sig Dispense Refill  . albuterol (PROVENTIL HFA;VENTOLIN HFA) 108 (90 Base) MCG/ACT inhaler Inhale 1-2 puffs into the lungs every 6 (six) hours as needed for wheezing or shortness of breath. 1 Inhaler 0  . aspirin 325 MG tablet Take 325 mg by mouth daily.    . budesonide (PULMICORT) 1 MG/2ML nebulizer solution Inhale into the lungs.    . Calcium Carbonate (CALCIUM 600 PO) Take 1 tablet by mouth 2 (two) times daily.    . Cholecalciferol (VITAMIN D-1000 MAX ST) 25 MCG (1000 UT) tablet Take by mouth.    . ciprofloxacin (CIPRO) 500 MG tablet Take 1 tablet (500 mg total) by mouth every 12 (twelve) hours. 14 tablet 0  . diclofenac sodium (VOLTAREN) 1 % GEL SMARTSIG:2-4 Gram(s) Topical Daily    . dicyclomine (BENTYL) 10 MG capsule Take 1 capsule by mouth 3 (three) times daily.    . Dupilumab (DUPIXENT) 300 MG/2ML SOPN Inject into the skin.    Marland Kitchen EPINEPHrine 0.3 mg/0.3 mL IJ SOAJ injection INJECT (0.3)ML INTO THEUMUSCLE ONCE AS NEEDED FORNANAPHYLAXIS FOR ONE DOSE.    . ferrous sulfate 220 (44 Fe) MG/5ML solution Take 220 mg by mouth 2 (two) times a day.    . fexofenadine (ALLEGRA) 180 MG tablet Take 180 mg by mouth daily.    Marland Kitchen gabapentin (NEURONTIN) 600 MG tablet Take 1 tablet (600 mg total) by mouth 2 (two) times daily. 180 tablet 0  . ipratropium-albuterol (DUONEB) 0.5-2.5 (3) MG/3ML SOLN Inhale into the lungs.    Marland Kitchen ketoconazole (NIZORAL) 2 % cream MIX WITH FLUTICASONE CREAM AND APPLY TO AFFECTED AREA UP TO  TWICE DAILY AS NEEDED. 30 g 0  . levETIRAcetam (KEPPRA) 750 MG tablet Take 1 tablet by mouth daily.    Marland Kitchen levothyroxine (SYNTHROID) 50 MCG tablet Take 1 tablet by mouth daily.    Marland Kitchen loratadine (CLARITIN) 10 MG tablet Take 10 mg by mouth daily.    . Magnesium 250 MG TABS Take by mouth.    . metoprolol tartrate (LOPRESSOR) 25 MG tablet Take 0.5 tablets by mouth 2 (two) times a day.    . metroNIDAZOLE (FLAGYL) 500 MG tablet Take 1 tablet (500 mg total) by mouth 3 (three) times daily. 21 tablet 0  . Multiple Vitamin (MULTIVITAMIN WITH MINERALS) TABS tablet Take 1 tablet by mouth daily.    . naproxen (NAPROSYN) 500 MG tablet Take 500 mg by mouth 2 (two) times daily with a meal.    . oxyCODONE-acetaminophen (PERCOCET/ROXICET) 5-325 MG tablet Take 1-2 tablets by mouth every 6 (six) hours as needed for severe pain. 7 tablet 0  . pantoprazole (PROTONIX) 40 MG tablet Take 1 tablet by mouth daily.    . Red Yeast Rice 600 MG CAPS Take 1 capsule by mouth 2 (two) times daily.    . Selenium 200 MCG TABS Take by mouth daily.    . simvastatin (ZOCOR) 40 MG tablet Take by mouth.    . sodium chloride (OCEAN) 0.65 % SOLN nasal spray Place 1 spray into both nostrils as needed for congestion.    . TRELEGY ELLIPTA 100-62.5-25 MCG/INH AEPB     . vortioxetine HBr (TRINTELLIX) 10 MG TABS tablet Take 1 tablet (10 mg total) by mouth daily. 30 tablet 1  . ziprasidone (GEODON) 60 MG capsule Take 1 capsule (60 mg total) by mouth 2 (two) times daily with a meal. 180 capsule 0   No current facility-administered medications for this visit.  Psychiatric Specialty Exam: Review of Systems  There were no vitals taken for this visit.There is no height or weight on file to calculate BMI.  General Appearance: unable to assess due to phone visit  Eye Contact:  unable to assess due to phone visit  Speech:  Clear and Coherent and Normal Rate  Volume:  Normal  Mood:  depressed  Affect:  Congruent  Thought Process:  Goal  Directed, Linear and Descriptions of Associations: Intact  Orientation:  Full (Time, Place, and Person)  Thought Content: Logical   Suicidal Thoughts:  No  Homicidal Thoughts:  No  Memory:  Recent;   Good Remote;   Good  Judgement:  Good  Insight:  Good  Psychomotor Activity:  Normal  Concentration:  Concentration: Good and Attention Span: Good  Recall:  Good  Fund of Knowledge: Good  Language: Good  Akathisia:  Negative  Handed:  Right  AIMS (if indicated): not done  Assets:  Communication Skills Desire for Improvement Financial Resources/Insurance Housing  ADL's:  Intact  Cognition: WNL  Sleep:  Fair     Screenings: PHQ2-9     Office Visit from 07/28/2016 in [facility] Office Visit from 06/15/2016 in [facility]  PHQ-2 Total Score 6 6  PHQ-9 Total Score 14 14       Assessment and Plan: Patient is agreeable with the recommendation of increasing dose of Trintellix to help with her depressive symptoms.  1. Bipolar 1 disorder, depressed, moderate (HCC)  - gabapentin (NEURONTIN) 600 MG tablet; Take 1 tablet (600 mg total) by mouth 2 (two) times daily.  Dispense: 180 tablet; Refill: 0 - ziprasidone (GEODON) 60 MG capsule; Take 1 capsule (60 mg total) by mouth 2 (two) times daily with a meal.  Dispense: 180 capsule; Refill: 0 - Increase vortioxetine HBr (TRINTELLIX) 20 MG TABS tablet; Take 1 tablet (20 mg total) by mouth daily.  Dispense: 90 tablet; Refill: 0  2. PTSD (post-traumatic stress disorder)  - vortioxetine HBr (TRINTELLIX) 20 MG TABS tablet; Take 1 tablet (20 mg total) by mouth daily.  Dispense: 90 tablet; Refill: 0   F/up in 2 months.  Zena Amos, MD 02/15/2020 11:19 AM

## 2020-03-22 ENCOUNTER — Telehealth: Payer: Self-pay | Admitting: Adult Health

## 2020-03-22 NOTE — Telephone Encounter (Signed)
Telephoned patient at home number and patient states having urinary frequency, burning, and urinating every 15 minutes. Spoke with provider patient will need to see PCP or Urgent Care unable to call in medication. Patient voiced understanding.

## 2020-03-22 NOTE — Telephone Encounter (Signed)
Patient called stating that she is having some issues that she would like Jennifer's help with. Pt states that she is frequently going to the restroom. Please contact pt

## 2020-04-01 ENCOUNTER — Telehealth (HOSPITAL_COMMUNITY): Payer: Self-pay | Admitting: Psychiatry

## 2020-04-01 NOTE — Telephone Encounter (Signed)
Complaining of increased voices since last HS. Unaware of a trigger. In the past when this has happened to her she has increased her Geodon to 120 mg BID currently taking 60 mg BID. She doesn't want to make a change without first speaking with Dr Evelene Croon

## 2020-04-02 ENCOUNTER — Telehealth (HOSPITAL_COMMUNITY): Payer: Self-pay | Admitting: *Deleted

## 2020-04-02 MED ORDER — ZIPRASIDONE HCL 60 MG PO CAPS: ORAL_CAPSULE | ORAL | 1 refills | Status: DC

## 2020-04-02 NOTE — Telephone Encounter (Signed)
Called back to patient after Dr Evelene Croon approved her doubling up on her Geodon to 60mg  2 BID with food. Also, informed patient she had a new RX called in for it as she would run out soon now that she is doubling up on it. She expressed her appreciation.

## 2020-04-02 NOTE — Addendum Note (Signed)
Addended by: Zena Amos on: 04/02/2020 10:07 AM   Modules accepted: Orders

## 2020-04-02 NOTE — Telephone Encounter (Signed)
Called and spoke with the patient.  Patient reported that whenever she is under a lot of stress her multiple personalities come out and she feels overwhelmed.  She stated that in the past she was prescribed double the dose of Geodon that she takes now.  She stated that she normally doubles the dose 220 mg twice a day whenever she has these phases and that helps.  She asked for refills for higher dose of Geodon. Patient was advised to do that if that has been effective.  She was reminded of her appointment with the writer on October 12.

## 2020-04-05 ENCOUNTER — Other Ambulatory Visit (HOSPITAL_COMMUNITY): Payer: Self-pay | Admitting: *Deleted

## 2020-04-05 MED ORDER — ZIPRASIDONE HCL 80 MG PO CAPS: 80.0000 mg | ORAL_CAPSULE | Freq: Two times a day (BID) | ORAL | 1 refills | Status: DC

## 2020-04-05 NOTE — Telephone Encounter (Signed)
New Rx for Geodon 80 mg BID sent to her pharmacy.

## 2020-04-05 NOTE — Addendum Note (Signed)
Addended by: Zena Amos on: 04/05/2020 10:19 AM   Modules accepted: Orders

## 2020-04-05 NOTE — Telephone Encounter (Signed)
Spoke with Rx concerning ziprasidone (GEODON) 60 MG capsule Take 2 capsules twice daily with meals  PER insurance over recommended dose & will not pay. Insurance will pay for 80 mg capsules 2 x day.

## 2020-04-16 ENCOUNTER — Telehealth (INDEPENDENT_AMBULATORY_CARE_PROVIDER_SITE_OTHER): Payer: Medicare Other | Admitting: Psychiatry

## 2020-04-16 ENCOUNTER — Encounter (HOSPITAL_COMMUNITY): Payer: Self-pay | Admitting: Psychiatry

## 2020-04-16 ENCOUNTER — Other Ambulatory Visit: Payer: Self-pay

## 2020-04-16 DIAGNOSIS — F4310 Post-traumatic stress disorder, unspecified: Secondary | ICD-10-CM | POA: Diagnosis not present

## 2020-04-16 DIAGNOSIS — F3132 Bipolar disorder, current episode depressed, moderate: Secondary | ICD-10-CM

## 2020-04-16 MED ORDER — ZIPRASIDONE HCL 80 MG PO CAPS: 80.0000 mg | ORAL_CAPSULE | Freq: Two times a day (BID) | ORAL | 1 refills | Status: DC

## 2020-04-16 MED ORDER — VORTIOXETINE HBR 20 MG PO TABS: 20.0000 mg | ORAL_TABLET | Freq: Every day | ORAL | 0 refills | Status: DC

## 2020-04-16 MED ORDER — GABAPENTIN 600 MG PO TABS: 600.0000 mg | ORAL_TABLET | Freq: Two times a day (BID) | ORAL | 0 refills | Status: DC

## 2020-04-16 NOTE — Progress Notes (Signed)
BH MD OP Progress Note  Virtual Visit via Telephone Note  I connected with MOSETTA FERDINAND on 04/16/20 at 11:40 AM EDT by telephone and verified that I am speaking with the correct person using two identifiers.  Location: Patient: home Provider: Clinic   I discussed the limitations, risks, security and privacy concerns of performing an evaluation and management service by telephone and the availability of in person appointments. I also discussed with the patient that there may be a patient responsible charge related to this service. The patient expressed understanding and agreed to proceed.   I provided 15 minutes of non-face-to-face time during this encounter.    04/16/2020 12:50 PM KALIYA SHREINER  MRN:  [id number]  Chief Complaint:  " The personalities are going away."  HPI: Patient called the clinic a few weeks ago to report that she was not doing well and that she wanted to try a higher dose of Geodon as taking a higher dose in the past that helped her immensely.  Writer had spoken to her on the phone and she reported that she had noticed increased influx of the various personalities that she has.  The writer had increase the dose of Geodon to 80 mg twice a day.   Patient found that she is feeling much better now after the dose of Geodon was increased to 80 mg twice a day.  She stated that the back personalities have left her.  Some of the good personality is still here but they are also going away.  She stated that she is feeling much better and not as anxious and depressed.  She was grateful that the dose was increased and she would like to continue the same dose for now.  She informed that things are going better and she asked if the writer would be able to help her fill out a form stating that she has disability so that she can get some debate on [place].  Visit Diagnosis:    ICD-10-CM   1. Bipolar 1 disorder, depressed, moderate (HCC)  F31.32 ziprasidone (GEODON) 80 MG capsule     vortioxetine HBr (TRINTELLIX) 20 MG TABS tablet    gabapentin (NEURONTIN) 600 MG tablet  2. PTSD (post-traumatic stress disorder)  F43.10 vortioxetine HBr (TRINTELLIX) 20 MG TABS tablet    Past Psychiatric History: Bipolar 1 disorder, PTSD  Past Medical History:  Past Medical History:  Diagnosis Date  . Anemia   . Asthma   . Bipolar 1 disorder (HCC)   . Bronchitis   . COPD (chronic obstructive pulmonary disease) (HCC)   . Diverticulitis   . IBS (irritable bowel syndrome)   . Migraine   . Neuropathy   . Schizophrenia (HCC)   . Thyroid disease     Past Surgical History:  Procedure Laterality Date  . ABDOMINAL HYSTERECTOMY    . broke left arm    . CESAREAN SECTION    . COLONOSCOPY N/A 08/30/2014   Procedure: COLONOSCOPY;  Surgeon: Malissa Hippo, MD;  Location: [facility];  Service: Endoscopy;  Laterality: N/A;  1200  . ELBOW SURGERY    . FOOT SURGERY    . KNEE SURGERY    . TONSILLECTOMY      Family Psychiatric History: see below  Family History:  Family History  Problem Relation Age of Onset  . Dementia Mother   . Bipolar disorder Mother   . Dementia Father   . Other Son        MVA accident  Social History:  Social History   Socioeconomic History  . Marital status: Married    Spouse name: Not on file  . Number of children: Not on file  . Years of education: Not on file  . Highest education level: Not on file  Occupational History  . Not on file  Tobacco Use  . Smoking status: Former Smoker    Packs/day: 4.00    Types: Cigarettes    Quit date: 07/06/2008    Years since quitting: 11.7  . Smokeless tobacco: Never Used  Vaping Use  . Vaping Use: Never used  Substance and Sexual Activity  . Alcohol use: No  . Drug use: No  . Sexual activity: Yes    Birth control/protection: Surgical    Comment: hyst  Other Topics Concern  . Not on file  Social History Narrative  . Not on file   Social Determinants of Health   Financial Resource Strain:   .  Difficulty of Paying Living Expenses: Not on file  Food Insecurity:   . Worried About Programme researcher, broadcasting/film/video in the Last Year: Not on file  . Ran Out of Food in the Last Year: Not on file  Transportation Needs:   . Lack of Transportation (Medical): Not on file  . Lack of Transportation (Non-Medical): Not on file  Physical Activity:   . Days of Exercise per Week: Not on file  . Minutes of Exercise per Session: Not on file  Stress:   . Feeling of Stress : Not on file  Social Connections:   . Frequency of Communication with Friends and Family: Not on file  . Frequency of Social Gatherings with Friends and Family: Not on file  . Attends Religious Services: Not on file  . Active Member of Clubs or Organizations: Not on file  . Attends Banker Meetings: Not on file  . Marital Status: Not on file    Allergies:  Allergies  Allergen Reactions  . Aminophylline Anaphylaxis  . Sumatriptan Anaphylaxis and Other (See Comments)    Causes fainting  . Theophylline Anaphylaxis  . Codeine Itching and Other (See Comments)    Too strong for patient   . Depakote [Divalproex Sodium] Other (See Comments)    Hair loss  . Divalproex Sodium Other (See Comments)    Causes hair to fall out  . Doxycycline Other (See Comments)    headache  . Lamictal [Lamotrigine]     Destroys thyroid  . Lithium Other (See Comments)    Adverse reaction to Thyroid   . Magnesium-Containing Compounds Hives  . Metronidazole   . Nabumetone Swelling  . Naproxen Swelling  . Other     Hair dye  . Penicillins Nausea And Vomiting    Has patient had a PCN reaction causing immediate rash, facial/tongue/throat swelling, SOB or lightheadedness with hypotension: Yes Has patient had a PCN reaction causing severe rash involving mucus membranes or skin necrosis: No Has patient had a PCN reaction that required hospitalization Yes Has patient had a PCN reaction occurring within the last 10 years: No If all of the above  answers are "NO", then may proceed with Cephalosporin use.   Marland Kitchen Pentazocine Lactate Other (See Comments)    Unknown reaction  . Risperidone Other (See Comments)    Insomnia   . Septra Ds [Sulfamethoxazole-Trimethoprim]   . Seroquel [Quetiapine] Swelling  . Tegretol [Carbamazepine] Swelling  . Clonidine Derivatives     Dizziness at 0.1 mg  . Tetracycline Rash  Headaches  . Zyprexa [Olanzapine] Rash and Other (See Comments)    Insomnia    Metabolic Disorder Labs: No results found for: HGBA1C, MPG No results found for: PROLACTIN No results found for: CHOL, TRIG, HDL, CHOLHDL, VLDL, LDLCALC Lab Results  Component Value Date   TSH 0.888 07/07/2010    Therapeutic Level Labs: Lab Results  Component Value Date   LITHIUM 1.27 10/25/2013   No results found for: VALPROATE No components found for:  CBMZ  Current Medications: Current Outpatient Medications  Medication Sig Dispense Refill  . albuterol (PROVENTIL HFA;VENTOLIN HFA) 108 (90 Base) MCG/ACT inhaler Inhale 1-2 puffs into the lungs every 6 (six) hours as needed for wheezing or shortness of breath. 1 Inhaler 0  . aspirin 325 MG tablet Take 325 mg by mouth daily.    . budesonide (PULMICORT) 1 MG/2ML nebulizer solution Inhale into the lungs.    . Calcium Carbonate (CALCIUM 600 PO) Take 1 tablet by mouth 2 (two) times daily.    . Cholecalciferol (VITAMIN D-1000 MAX ST) 25 MCG (1000 UT) tablet Take by mouth.    . ciprofloxacin (CIPRO) 500 MG tablet Take 1 tablet (500 mg total) by mouth every 12 (twelve) hours. 14 tablet 0  . diclofenac sodium (VOLTAREN) 1 % GEL SMARTSIG:2-4 Gram(s) Topical Daily    . dicyclomine (BENTYL) 10 MG capsule Take 1 capsule by mouth 3 (three) times daily.    . Dupilumab (DUPIXENT) 300 MG/2ML SOPN Inject into the skin.    Marland Kitchen. EPINEPHrine 0.3 mg/0.3 mL IJ SOAJ injection INJECT (0.3)ML INTO THEUMUSCLE ONCE AS NEEDED FORNANAPHYLAXIS FOR ONE DOSE.    . ferrous sulfate 220 (44 Fe) MG/5ML solution Take 220 mg by  mouth 2 (two) times a day.    . fexofenadine (ALLEGRA) 180 MG tablet Take 180 mg by mouth daily.    Marland Kitchen. gabapentin (NEURONTIN) 600 MG tablet Take 1 tablet (600 mg total) by mouth 2 (two) times daily. 180 tablet 0  . ketoconazole (NIZORAL) 2 % cream MIX WITH FLUTICASONE CREAM AND APPLY TO AFFECTED AREA UP TO TWICE DAILY AS NEEDED. 30 g 0  . levETIRAcetam (KEPPRA) 750 MG tablet Take 1 tablet by mouth daily.    Marland Kitchen. levothyroxine (SYNTHROID) 50 MCG tablet Take 1 tablet by mouth daily.    Marland Kitchen. loratadine (CLARITIN) 10 MG tablet Take 10 mg by mouth daily.    . Magnesium 250 MG TABS Take by mouth.    . metoprolol tartrate (LOPRESSOR) 25 MG tablet Take 0.5 tablets by mouth 2 (two) times a day.    . metroNIDAZOLE (FLAGYL) 500 MG tablet Take 1 tablet (500 mg total) by mouth 3 (three) times daily. 21 tablet 0  . Multiple Vitamin (MULTIVITAMIN WITH MINERALS) TABS tablet Take 1 tablet by mouth daily.    . naproxen (NAPROSYN) 500 MG tablet Take 500 mg by mouth 2 (two) times daily with a meal.    . oxyCODONE-acetaminophen (PERCOCET/ROXICET) 5-325 MG tablet Take 1-2 tablets by mouth every 6 (six) hours as needed for severe pain. 7 tablet 0  . pantoprazole (PROTONIX) 40 MG tablet Take 1 tablet by mouth daily.    . Red Yeast Rice 600 MG CAPS Take 1 capsule by mouth 2 (two) times daily.    . Selenium 200 MCG TABS Take by mouth daily.    . simvastatin (ZOCOR) 40 MG tablet Take by mouth.    . sodium chloride (OCEAN) 0.65 % SOLN nasal spray Place 1 spray into both nostrils as needed for congestion.    .Marland Kitchen  TRELEGY ELLIPTA 100-62.5-25 MCG/INH AEPB     . vortioxetine HBr (TRINTELLIX) 20 MG TABS tablet Take 1 tablet (20 mg total) by mouth daily. 90 tablet 0  . ziprasidone (GEODON) 80 MG capsule Take 1 capsule (80 mg total) by mouth 2 (two) times daily with a meal. 60 capsule 1   No current facility-administered medications for this visit.      Psychiatric Specialty Exam: Review of Systems  There were no vitals taken for  this visit.There is no height or weight on file to calculate BMI.  General Appearance: unable to assess due to phone visit  Eye Contact:  unable to assess due to phone visit  Speech:  Clear and Coherent and Normal Rate  Volume:  Normal  Mood:  depressed  Affect:  Congruent  Thought Process:  Goal Directed, Linear and Descriptions of Associations: Intact  Orientation:  Full (Time, Place, and Person)  Thought Content: Logical   Suicidal Thoughts:  No  Homicidal Thoughts:  No  Memory:  Recent;   Good Remote;   Good  Judgement:  Good  Insight:  Good  Psychomotor Activity:  Normal  Concentration:  Concentration: Good and Attention Span: Good  Recall:  Good  Fund of Knowledge: Good  Language: Good  Akathisia:  Negative  Handed:  Right  AIMS (if indicated): not done  Assets:  Communication Skills Desire for Improvement Financial Resources/Insurance Housing  ADL's:  Intact  Cognition: WNL  Sleep:  Fair     Screenings: PHQ2-9     Office Visit from 07/28/2016 in [facility] Office Visit from 06/15/2016 in [facility]  PHQ-2 Total Score 6 6  PHQ-9 Total Score 14 14       Assessment and Plan: Patient seems to be doing better than her last visit.  We will continue same regimen for now.  1. Bipolar 1 disorder, depressed, moderate (HCC)  - ziprasidone (GEODON) 80 MG capsule; Take 1 capsule (80 mg total) by mouth 2 (two) times daily with a meal.  Dispense: 60 capsule; Refill: 1 - vortioxetine HBr (TRINTELLIX) 20 MG TABS tablet; Take 1 tablet (20 mg total) by mouth daily.  Dispense: 90 tablet; Refill: 0 - gabapentin (NEURONTIN) 600 MG tablet; Take 1 tablet (600 mg total) by mouth 2 (two) times daily.  Dispense: 180 tablet; Refill: 0  2. PTSD (post-traumatic stress disorder)  - vortioxetine HBr (TRINTELLIX) 20 MG TABS tablet; Take 1 tablet (20 mg total) by mouth daily.  Dispense: 90 tablet; Refill: 0  F/up in 2 months.  Zena Amos, MD 04/16/2020 12:50 PM

## 2020-05-27 ENCOUNTER — Telehealth (HOSPITAL_COMMUNITY): Payer: Self-pay | Admitting: *Deleted

## 2020-05-27 MED ORDER — MIRTAZAPINE 15 MG PO TABS: 15.0000 mg | ORAL_TABLET | Freq: Every day | ORAL | 0 refills | Status: DC

## 2020-05-27 NOTE — Telephone Encounter (Signed)
Patient left message for writer wanting to speak with Dr Evelene Croon or [facility], to inform them she is having worsening depression and is not leaving her home, crying and having poor sleep and appetite now for one week. She is hoping Dr Evelene Croon can adjust her medicine. Will bring this concern to Drs attention.

## 2020-05-27 NOTE — Telephone Encounter (Addendum)
I called and spoke with the patient. She informed that she is feeling very depressed. She has not stepped out of the house for more than a week.  She is never felt so depressed in a very long time.  She denied any suicidal ideations.  She stated that she is trying to prepare for Thanksgiving meal and plans to do all the cooking but has no energy to do so at this moment. She reported that she is sleeping fairly okay but has no appetite or desire to do anything else. Writer asked if she is ever taken Wellbutrin in the past to which she replied that she has not did not help her at all.  She was offered recommendation of trial of adding mirtazapine to her regimen of Trintellix 20 mg daily and Geodon 80 mg twice daily.  Patient stated that she was willing to try it. Potential side effects of medication and risks vs benefits of treatment vs non-treatment were explained and discussed. All questions were answered. Prescription for mirtazapine 15 mg at bedtime sent to her pharmacy. She was reminded of keeping her schedule appointment with writer for December 9.

## 2020-05-27 NOTE — Telephone Encounter (Signed)
Opened a second time in error, no further documentation needed.

## 2020-05-27 NOTE — Addendum Note (Signed)
Addended by: Zena Amos on: 05/27/2020 11:35 AM   Modules accepted: Orders

## 2020-06-03 ENCOUNTER — Telehealth (HOSPITAL_COMMUNITY): Payer: Self-pay | Admitting: *Deleted

## 2020-06-03 NOTE — Telephone Encounter (Signed)
I called the pt back, however, went to VM. Can you please call her back and recommend that she discontinues the recently started Mirtazapine and see if that helps to improve her mood. We can discuss more options at the time of her next upcoming visit on 12/9.

## 2020-06-03 NOTE — Telephone Encounter (Signed)
VM left for writer over the holiday weekend stating she had "bad reaction " to the medicine she was recently started described the bad reaction as giving her a bad attitude. Will bring this concern to Dr Magdalen Spatz attention.

## 2020-06-13 ENCOUNTER — Encounter (HOSPITAL_COMMUNITY): Payer: Self-pay | Admitting: Psychiatry

## 2020-06-13 ENCOUNTER — Other Ambulatory Visit: Payer: Self-pay

## 2020-06-13 ENCOUNTER — Telehealth (INDEPENDENT_AMBULATORY_CARE_PROVIDER_SITE_OTHER): Payer: Medicare Other | Admitting: Psychiatry

## 2020-06-13 DIAGNOSIS — F3132 Bipolar disorder, current episode depressed, moderate: Secondary | ICD-10-CM | POA: Diagnosis not present

## 2020-06-13 DIAGNOSIS — F4310 Post-traumatic stress disorder, unspecified: Secondary | ICD-10-CM

## 2020-06-13 MED ORDER — BUSPIRONE HCL 15 MG PO TABS: 15.0000 mg | ORAL_TABLET | Freq: Two times a day (BID) | ORAL | 1 refills | Status: DC

## 2020-06-13 MED ORDER — ZIPRASIDONE HCL 80 MG PO CAPS: 80.0000 mg | ORAL_CAPSULE | Freq: Two times a day (BID) | ORAL | 1 refills | Status: DC

## 2020-06-13 MED ORDER — VORTIOXETINE HBR 20 MG PO TABS: 20.0000 mg | ORAL_TABLET | Freq: Every day | ORAL | 0 refills | Status: DC

## 2020-06-13 NOTE — Progress Notes (Signed)
BH MD OP Progress Note  Virtual Visit via Telephone Note  I connected with Kristen BeachColette J Newson on 06/13/20 at  2:20 PM EST by telephone and verified that I am speaking with the correct person using two identifiers.  Location: Patient: home Provider: Clinic   I discussed the limitations, risks, security and privacy concerns of performing an evaluation and management service by telephone and the availability of in person appointments. I also discussed with the patient that there may be a patient responsible charge related to this service. The patient expressed understanding and agreed to proceed.   I provided 17 minutes of non-face-to-face time during this encounter.    06/13/2020 1:50 PM Kristen Ryan  MRN:  [id number]  Chief Complaint:  " I have not been out of the house in weeks."  HPI: Patient contacted the clinic a few weeks ago to report that she was not doing well and that she was feeling very anxious.  She had not gone out of the house in a couple of weeks.  At that time I recommended adding mirtazapine to her regimen.  However a few days later patient called and reported that mirtazapine made her more irritable than usual and that she had a bad Thanksgiving holiday because of that.  Writer had recommended that she discontinue mirtazapine at that point and then to touch base at the time of this upcoming appointment.  Today, patient reported that she is not as irritable as she was when she was taking the mirtazapine however she still feels depressed and anxious.  She stated that the only medicine that helped her was Xanax which she took for 22 years.  She stated that lately she has been a nervous wreck and does not feel like going out of the house.  She swore up and down that Xanax is the only medicine that works for her.  She was tried on Ativan by Dr. Vanetta ShawlHisada in 2018-19 however patient did not feel that helped any. However writer informed her that since she is taking oxycodone for  chronic pain would not be prescribing any benzodiazepines to her. Patient stated that hydroxyzine makes her have vomiting and she is allergic to it so she cannot try it again.  She has tried buspirone in the past however she is willing to try it again. She also informed that her PCP Dr. Sampson GoonFitzgerald recently increased her dose of gabapentin and she is hoping that will help her to.  As per EMR, her dose of gabapentin was increased to 600 mg 3 times a day by Dr. Sampson GoonFitzgerald on 06/10/20.    Visit Diagnosis:    ICD-10-CM   1. Bipolar 1 disorder, depressed, moderate (HCC)  F31.32   2. PTSD (post-traumatic stress disorder)  F43.10     Past Psychiatric History: Bipolar 1 disorder, PTSD  Past Medical History:  Past Medical History:  Diagnosis Date  . Anemia   . Asthma   . Bipolar 1 disorder (HCC)   . Bronchitis   . COPD (chronic obstructive pulmonary disease) (HCC)   . Diverticulitis   . IBS (irritable bowel syndrome)   . Migraine   . Neuropathy   . Schizophrenia (HCC)   . Thyroid disease     Past Surgical History:  Procedure Laterality Date  . ABDOMINAL HYSTERECTOMY    . broke left arm    . CESAREAN SECTION    . COLONOSCOPY N/A 08/30/2014   Procedure: COLONOSCOPY;  Surgeon: Malissa HippoNajeeb U Rehman, MD;  Location: [facility];  Service: Endoscopy;  Laterality: N/A;  1200  . ELBOW SURGERY    . FOOT SURGERY    . KNEE SURGERY    . TONSILLECTOMY      Family Psychiatric History: see below  Family History:  Family History  Problem Relation Age of Onset  . Dementia Mother   . Bipolar disorder Mother   . Dementia Father   . Other Son        MVA accident    Social History:  Social History   Socioeconomic History  . Marital status: Married    Spouse name: Not on file  . Number of children: Not on file  . Years of education: Not on file  . Highest education level: Not on file  Occupational History  . Not on file  Tobacco Use  . Smoking status: Former Smoker    Packs/day: 4.00     Types: Cigarettes    Quit date: 07/06/2008    Years since quitting: 11.9  . Smokeless tobacco: Never Used  Vaping Use  . Vaping Use: Never used  Substance and Sexual Activity  . Alcohol use: No  . Drug use: No  . Sexual activity: Yes    Birth control/protection: Surgical    Comment: hyst  Other Topics Concern  . Not on file  Social History Narrative  . Not on file   Social Determinants of Health   Financial Resource Strain: Not on file  Food Insecurity: Not on file  Transportation Needs: Not on file  Physical Activity: Not on file  Stress: Not on file  Social Connections: Not on file    Allergies:  Allergies  Allergen Reactions  . Aminophylline Anaphylaxis  . Sumatriptan Anaphylaxis and Other (See Comments)    Causes fainting  . Theophylline Anaphylaxis  . Codeine Itching and Other (See Comments)    Too strong for patient   . Depakote [Divalproex Sodium] Other (See Comments)    Hair loss  . Divalproex Sodium Other (See Comments)    Causes hair to fall out  . Doxycycline Other (See Comments)    headache  . Lamictal [Lamotrigine]     Destroys thyroid  . Lithium Other (See Comments)    Adverse reaction to Thyroid   . Magnesium-Containing Compounds Hives  . Metronidazole   . Nabumetone Swelling  . Naproxen Swelling  . Other     Hair dye  . Penicillins Nausea And Vomiting    Has patient had a PCN reaction causing immediate rash, facial/tongue/throat swelling, SOB or lightheadedness with hypotension: Yes Has patient had a PCN reaction causing severe rash involving mucus membranes or skin necrosis: No Has patient had a PCN reaction that required hospitalization Yes Has patient had a PCN reaction occurring within the last 10 years: No If all of the above answers are "NO", then may proceed with Cephalosporin use.   Marland Kitchen Pentazocine Lactate Other (See Comments)    Unknown reaction  . Risperidone Other (See Comments)    Insomnia   . Septra Ds  [Sulfamethoxazole-Trimethoprim]   . Seroquel [Quetiapine] Swelling  . Tegretol [Carbamazepine] Swelling  . Clonidine Derivatives     Dizziness at 0.1 mg  . Tetracycline Rash    Headaches  . Zyprexa [Olanzapine] Rash and Other (See Comments)    Insomnia    Metabolic Disorder Labs: No results found for: HGBA1C, MPG No results found for: PROLACTIN No results found for: CHOL, TRIG, HDL, CHOLHDL, VLDL, LDLCALC Lab Results  Component Value Date  TSH 0.888 07/07/2010    Therapeutic Level Labs: Lab Results  Component Value Date   LITHIUM 1.27 10/25/2013   No results found for: VALPROATE No components found for:  CBMZ  Current Medications: Current Outpatient Medications  Medication Sig Dispense Refill  . albuterol (PROVENTIL HFA;VENTOLIN HFA) 108 (90 Base) MCG/ACT inhaler Inhale 1-2 puffs into the lungs every 6 (six) hours as needed for wheezing or shortness of breath. 1 Inhaler 0  . aspirin 325 MG tablet Take 325 mg by mouth daily.    . budesonide (PULMICORT) 1 MG/2ML nebulizer solution Inhale into the lungs.    . Calcium Carbonate (CALCIUM 600 PO) Take 1 tablet by mouth 2 (two) times daily.    . Cholecalciferol (VITAMIN D-1000 MAX ST) 25 MCG (1000 UT) tablet Take by mouth.    . diclofenac sodium (VOLTAREN) 1 % GEL SMARTSIG:2-4 Gram(s) Topical Daily    . dicyclomine (BENTYL) 10 MG capsule Take 1 capsule by mouth 3 (three) times daily.    . Dupilumab (DUPIXENT) 300 MG/2ML SOPN Inject into the skin.    Marland Kitchen EPINEPHrine 0.3 mg/0.3 mL IJ SOAJ injection INJECT (0.3)ML INTO THEUMUSCLE ONCE AS NEEDED FORNANAPHYLAXIS FOR ONE DOSE.    . ferrous sulfate 220 (44 Fe) MG/5ML solution Take 220 mg by mouth 2 (two) times a day.    . fexofenadine (ALLEGRA) 180 MG tablet Take 180 mg by mouth daily.    Marland Kitchen gabapentin (NEURONTIN) 600 MG tablet Take 1 tablet (600 mg total) by mouth 2 (two) times daily. 180 tablet 0  . ketoconazole (NIZORAL) 2 % cream MIX WITH FLUTICASONE CREAM AND APPLY TO AFFECTED AREA UP  TO TWICE DAILY AS NEEDED. 30 g 0  . levETIRAcetam (KEPPRA) 750 MG tablet Take 1 tablet by mouth daily.    Marland Kitchen levothyroxine (SYNTHROID) 50 MCG tablet Take 1 tablet by mouth daily.    Marland Kitchen loratadine (CLARITIN) 10 MG tablet Take 10 mg by mouth daily.    . Magnesium 250 MG TABS Take by mouth.    . metoprolol tartrate (LOPRESSOR) 25 MG tablet Take 0.5 tablets by mouth 2 (two) times a day.    . metroNIDAZOLE (FLAGYL) 500 MG tablet Take 1 tablet (500 mg total) by mouth 3 (three) times daily. 21 tablet 0  . mirtazapine (REMERON) 15 MG tablet Take 1 tablet (15 mg total) by mouth at bedtime. 30 tablet 0  . Multiple Vitamin (MULTIVITAMIN WITH MINERALS) TABS tablet Take 1 tablet by mouth daily.    . naproxen (NAPROSYN) 500 MG tablet Take 500 mg by mouth 2 (two) times daily with a meal.    . oxyCODONE-acetaminophen (PERCOCET/ROXICET) 5-325 MG tablet Take 1-2 tablets by mouth every 6 (six) hours as needed for severe pain. 7 tablet 0  . pantoprazole (PROTONIX) 40 MG tablet Take 1 tablet by mouth daily.    . Red Yeast Rice 600 MG CAPS Take 1 capsule by mouth 2 (two) times daily.    . Selenium 200 MCG TABS Take by mouth daily.    . simvastatin (ZOCOR) 40 MG tablet Take by mouth.    . sodium chloride (OCEAN) 0.65 % SOLN nasal spray Place 1 spray into both nostrils as needed for congestion.    . TRELEGY ELLIPTA 100-62.5-25 MCG/INH AEPB     . vortioxetine HBr (TRINTELLIX) 20 MG TABS tablet Take 1 tablet (20 mg total) by mouth daily. 90 tablet 0  . ziprasidone (GEODON) 80 MG capsule Take 1 capsule (80 mg total) by mouth 2 (two) times daily  with a meal. 60 capsule 1   No current facility-administered medications for this visit.    Psychiatric Specialty Exam: Review of Systems  There were no vitals taken for this visit.There is no height or weight on file to calculate BMI.  General Appearance: unable to assess due to phone visit  Eye Contact:  unable to assess due to phone visit  Speech:  Clear and Coherent and  Normal Rate  Volume:  Normal  Mood:  depressed  Affect:  Congruent  Thought Process:  Goal Directed, Linear and Descriptions of Associations: Intact  Orientation:  Full (Time, Place, and Person)  Thought Content: Logical   Suicidal Thoughts:  No  Homicidal Thoughts:  No  Memory:  Recent;   Good Remote;   Good  Judgement:  Good  Insight:  Good  Psychomotor Activity:  Normal  Concentration:  Concentration: Good and Attention Span: Good  Recall:  Good  Fund of Knowledge: Good  Language: Good  Akathisia:  Negative  Handed:  Right  AIMS (if indicated): not done  Assets:  Communication Skills Desire for Improvement Financial Resources/Insurance Housing  ADL's:  Intact  Cognition: WNL  Sleep:  Fair     Screenings: PHQ2-9   Flowsheet Row Office Visit from 07/28/2016 in [facility] Office Visit from 06/15/2016 in [facility]  PHQ-2 Total Score 6 6  PHQ-9 Total Score 14 14       Assessment and Plan: Patient continues to have a lot of anxiety and feels that she is not doing as well as she was a few months ago.  She was agreeable to be starting buspirone to help with her anxiety symptoms.  1. Bipolar 1 disorder, depressed, moderate (HCC)  - vortioxetine HBr (TRINTELLIX) 20 MG TABS tablet; Take 1 tablet (20 mg total) by mouth daily.  Dispense: 90 tablet; Refill: 0 - ziprasidone (GEODON) 80 MG capsule; Take 1 capsule (80 mg total) by mouth 2 (two) times daily with a meal.  Dispense: 60 capsule; Refill: 1  2. PTSD (post-traumatic stress disorder)  - vortioxetine HBr (TRINTELLIX) 20 MG TABS tablet; Take 1 tablet (20 mg total) by mouth daily.  Dispense: 90 tablet; Refill: 0 - Start busPIRone (BUSPAR) 15 MG tablet; Take 1 tablet (15 mg total) by mouth 2 (two) times daily.  Dispense: 60 tablet; Refill: 1   F/up in 2 months.  Zena Amos, MD 06/13/2020 1:50 PM

## 2020-06-18 ENCOUNTER — Telehealth (HOSPITAL_COMMUNITY): Payer: Self-pay | Admitting: *Deleted

## 2020-06-18 NOTE — Telephone Encounter (Signed)
Please inform her that at this point writer does not have any other option left to offer: She is allergic to Vistaril and cannot take that for anxiety.  Her dose of gabapentin was recently increased by her PCP.  She wants to go back to Xanax however due to her being on opioid pain medication that can be prescribed to her. Please advise her that she can go to the nearest emergency room or she can come to [facility] for urgent evaluation.

## 2020-06-18 NOTE — Telephone Encounter (Signed)
SPOKE with PATIENT & INFORMED PER PROVIDER: Please inform her that at this point writer does not have any other option left to offer: She is allergic to Vistaril and cannot take that for anxiety.  Her dose of gabapentin was recently increased by her PCP.  She wants to go back to Xanax however due to her being on opioid pain medication that can be prescribed to her. Please advise her that she can go to the nearest emergency room or she can come to [facility] for urgent evaluation.

## 2020-06-18 NOTE — Telephone Encounter (Signed)
PATENT CALLED CRYING  & SOUNDING VERY UPSET STATES THAT SHE'S TAKING busPIRone (BUSPAR) 15 MG tablet  AS PRESCRIBED. AND THAT SHE NOT FEELING ANY BETTER.

## 2020-07-16 ENCOUNTER — Telehealth (HOSPITAL_COMMUNITY): Payer: Self-pay | Admitting: *Deleted

## 2020-07-16 NOTE — Telephone Encounter (Signed)
Yes, as you noted that this is not the first time we are receiving this message from Ms. Hegel. My suggestion is that she can try taking Buspirone 3 times a day instead of BID or try taking 2 tablets together twice a day. She is scheduled for f/up with me on 08/07/20.

## 2020-07-16 NOTE — Telephone Encounter (Signed)
Kristen Ryan called back stating she didn't have enough Buspar to increase her dose to TID. Called her pharmacy to check with them as the chart indicates she should have enough till Feb, and her next appt is 08/07/20. I called the pharmacy but they never answered but rather gave a vm that they will call me tomorrow . Will ask Dr Evelene Croon to call Rx in if she feels it is appropriate.

## 2020-07-16 NOTE — Telephone Encounter (Signed)
Called patient back with Dr Magdalen Spatz suggestion to increase frequency of her Buspar and to remind her of her next appt with the Dr.

## 2020-07-16 NOTE — Telephone Encounter (Signed)
VM left on writers phone stating she is having panic attacks, "cant half breath" and her stomach is in knots and she doesn't know what to do. Ms Copelin calls about once a month with similar complaints. Will make Dr Evelene Croon aware, but multiple changes have been offered to her in the past with out much satisfaction.

## 2020-07-18 ENCOUNTER — Telehealth (HOSPITAL_COMMUNITY): Payer: Self-pay | Admitting: *Deleted

## 2020-07-18 ENCOUNTER — Ambulatory Visit: Payer: Medicare Other | Admitting: Student in an Organized Health Care Education/Training Program

## 2020-07-18 DIAGNOSIS — F4310 Post-traumatic stress disorder, unspecified: Secondary | ICD-10-CM

## 2020-07-18 MED ORDER — BUSPIRONE HCL 15 MG PO TABS: 15.0000 mg | ORAL_TABLET | Freq: Three times a day (TID) | ORAL | 1 refills | Status: DC

## 2020-07-18 NOTE — Telephone Encounter (Signed)
New Rx sent.

## 2020-07-18 NOTE — Telephone Encounter (Signed)
I am sorry that she does not see any improvement in her anxiety symptoms, however, we have discussed this concern several times in the past. Patient remains on chronic narcotics for optimal pain control and as result she cannot be prescribed any benzodiazepines due to the potential risks associated with that combination. My suggestion would be that patient needs to engage in some hobby that she enjoys to keep her self distracted from getting anxious and work on the stressors that are bothering her. Unfortunately I do not have anything else to offer for anxiety.  She cannot take hydroxyzine. She can get a second opinion from different psychiatrist.

## 2020-07-18 NOTE — Addendum Note (Signed)
Addended by: Zena Amos on: 07/18/2020 02:42 PM   Modules accepted: Orders

## 2020-07-18 NOTE — Telephone Encounter (Signed)
Called today, told me she called yesterday but yesterday she called and her message asked to speak with Direce not me. She states today she continues to suffer with a lot of anxiety and all she knows to do doesn't work including the increase in her medicine the Dr directed for her. She says the only thing that has helped is the Xanax she used to take but she knows the dr Lowella Grip order that. Told her she was correct, but I would inform the Dr re her concern and call her back if there were any new directions for her.

## 2020-07-18 NOTE — Telephone Encounter (Signed)
Called Kristen Ryan back to underscore Dr Evelene Croon would not be writing any Xanax for her or meds in that class and encouraged to use her skills to distract her self and lessen her anxiety. She says she has done all that. Also, states she doesn't have enough Buspar to do the increase suggested to her of TID. Called her pharmacy to check when she last filled it.She last filled in on the 24 th of dec but also had filled it on 12/10. I suspect knowing this she has been taking it TID before the Dr suggested it to her. None the less she should be out of it today or tomorrow assuming she has been taking 3 a day. Will consult Dr Evelene Croon to write her a new rx reflecting the TID dosing.

## 2020-07-24 ENCOUNTER — Telehealth (HOSPITAL_COMMUNITY): Payer: Self-pay | Admitting: *Deleted

## 2020-07-24 MED ORDER — HYDROXYZINE HCL 25 MG PO TABS: 25.0000 mg | ORAL_TABLET | Freq: Three times a day (TID) | ORAL | 1 refills | Status: DC | PRN

## 2020-07-24 NOTE — Addendum Note (Signed)
Addended by: Zena Amos on: 07/24/2020 10:16 AM   Modules accepted: Orders

## 2020-07-24 NOTE — Telephone Encounter (Signed)
All right, sending Rx for Hydroxyzine 25 mg TID PRN to her preferred pharmacy.

## 2020-07-24 NOTE — Telephone Encounter (Signed)
Called saying she has been doing some research for something to help her. She found hydroxyzine and would like to try it. Will bring this information to Dr Magdalen Spatz attention that she would like to try hydroxyzine.

## 2020-07-24 NOTE — Telephone Encounter (Signed)
Called patient back with information that Dr Evelene Croon called in her new rx for Hydroxyzine. Left her a message to pick it up at her preferred pharmacy.

## 2020-08-01 ENCOUNTER — Ambulatory Visit: Payer: Medicare Other | Admitting: Student in an Organized Health Care Education/Training Program

## 2020-08-06 ENCOUNTER — Ambulatory Visit: Payer: Medicare Other | Admitting: Student in an Organized Health Care Education/Training Program

## 2020-08-06 ENCOUNTER — Telehealth (HOSPITAL_COMMUNITY): Payer: Self-pay | Admitting: *Deleted

## 2020-08-06 NOTE — Telephone Encounter (Signed)
Patient called thinking she had missed a call from Dr Evelene Croon. What she had missed was a courtesy call which is automated to remind her of her appt tomorrow. She knew of her appt tomorrow. She states she is doing great, the medicine has been very helpful to her, states her "heart is where its supposed to be and she is calmer." states the hydroxyzine has been very helpful.

## 2020-08-07 ENCOUNTER — Other Ambulatory Visit: Payer: Self-pay

## 2020-08-07 ENCOUNTER — Encounter (HOSPITAL_COMMUNITY): Payer: Self-pay | Admitting: Psychiatry

## 2020-08-07 ENCOUNTER — Telehealth (INDEPENDENT_AMBULATORY_CARE_PROVIDER_SITE_OTHER): Payer: Medicare Other | Admitting: Psychiatry

## 2020-08-07 DIAGNOSIS — F3132 Bipolar disorder, current episode depressed, moderate: Secondary | ICD-10-CM | POA: Diagnosis not present

## 2020-08-07 DIAGNOSIS — F4310 Post-traumatic stress disorder, unspecified: Secondary | ICD-10-CM | POA: Diagnosis not present

## 2020-08-07 MED ORDER — ZIPRASIDONE HCL 80 MG PO CAPS: 80.0000 mg | ORAL_CAPSULE | Freq: Two times a day (BID) | ORAL | 1 refills | Status: DC

## 2020-08-07 MED ORDER — VORTIOXETINE HBR 20 MG PO TABS: 20.0000 mg | ORAL_TABLET | Freq: Every day | ORAL | 0 refills | Status: DC

## 2020-08-07 MED ORDER — HYDROXYZINE HCL 25 MG PO TABS: 25.0000 mg | ORAL_TABLET | Freq: Three times a day (TID) | ORAL | 1 refills | Status: DC | PRN

## 2020-08-07 NOTE — Progress Notes (Signed)
BH MD OP Progress Note  Virtual Visit via Telephone Note  I connected with KONI SAVIN on 08/07/20 at  2:20 PM EST by telephone and verified that I am speaking with the correct person using two identifiers.  Location: Patient: home Provider: Clinic   I discussed the limitations, risks, security and privacy concerns of performing an evaluation and management service by telephone and the availability of in person appointments. I also discussed with the patient that there may be a patient responsible charge related to this service. The patient expressed understanding and agreed to proceed.   I provided 15 minutes of non-face-to-face time during this encounter.    08/07/2020 2:31 PM MORIA RANTANEN  MRN:  [id number]  Chief Complaint:  " I am doing much better."  HPI: Patient contacted the clinic several times in the past month to report that she was having intense anxiety.  Writer had informed her that on the hydroxyzine there was nothing else the writer could offer to her.  She tried buspirone which was not effective. Patient then hesitantly agreed to try hydroxyzine and writer had sent a prescription for hydroxyzine 25 mg 3 times daily. Today, patient reported that hydroxyzine has helped her immensely with her anxiety.  She stated that she does not feel that her heart is jumping out of her chest anymore.  She is not getting that weird feeling in her stomach and she feels much calmer. 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 She stated that things are going well in her life for now and she denied any specific triggers however she did state that she has chronic issues that she needs to work on.  She stated that she would like for the writer to connect her with a therapist because she has a lot of things that she needs to work on from her past. Clinical research associate informed her that the office staff will contact her later with an appointment with a  therapist.   Visit Diagnosis:    ICD-10-CM   1. Bipolar 1 disorder, depressed, moderate (HCC)  F31.32 hydrOXYzine (ATARAX/VISTARIL) 25 MG tablet    ziprasidone (GEODON) 80 MG capsule    vortioxetine HBr (TRINTELLIX) 20 MG TABS tablet  2. PTSD (post-traumatic stress disorder)  F43.10 vortioxetine HBr (TRINTELLIX) 20 MG TABS tablet    Past Psychiatric History: Bipolar 1 disorder, PTSD  Past Medical History:  Past Medical History:  Diagnosis Date  . Anemia   . Asthma   . Bipolar 1 disorder (HCC)   . Bronchitis   . COPD (chronic obstructive pulmonary disease) (HCC)   . Diverticulitis   . IBS (irritable bowel syndrome)   . Migraine   . Neuropathy   . Schizophrenia (HCC)   . Thyroid disease     Past Surgical History:  Procedure Laterality Date  . ABDOMINAL HYSTERECTOMY    . broke left arm    . CESAREAN SECTION    . COLONOSCOPY N/A 08/30/2014   Procedure: COLONOSCOPY;  Surgeon: Malissa Hippo, MD;  Location: [facility];  Service: Endoscopy;  Laterality: N/A;  1200  . ELBOW SURGERY    . FOOT SURGERY    . KNEE SURGERY    . TONSILLECTOMY      Family Psychiatric History: see below  Family History:  Family History  Problem Relation Age of Onset  . Dementia Mother   . Bipolar disorder Mother   . Dementia Father   . Other  Son        MVA accident    Social History:  Social History   Socioeconomic History  . Marital status: Married    Spouse name: Not on file  . Number of children: Not on file  . Years of education: Not on file  . Highest education level: Not on file  Occupational History  . Not on file  Tobacco Use  . Smoking status: Former Smoker    Packs/day: 4.00    Types: Cigarettes    Quit date: 07/06/2008    Years since quitting: 12.0  . Smokeless tobacco: Never Used  Vaping Use  . Vaping Use: Never used  Substance and Sexual Activity  . Alcohol use: No  . Drug use: No  . Sexual activity: Yes    Birth control/protection: Surgical    Comment: hyst   Other Topics Concern  . Not on file  Social History Narrative  . Not on file   Social Determinants of Health   Financial Resource Strain: Not on file  Food Insecurity: Not on file  Transportation Needs: Not on file  Physical Activity: Not on file  Stress: Not on file  Social Connections: Not on file    Allergies:  Allergies  Allergen Reactions  . Aminophylline Anaphylaxis  . Sumatriptan Anaphylaxis and Other (See Comments)    Causes fainting  . Theophylline Anaphylaxis  . Codeine Itching and Other (See Comments)    Too strong for patient   . Depakote [Divalproex Sodium] Other (See Comments)    Hair loss  . Divalproex Sodium Other (See Comments)    Causes hair to fall out  . Doxycycline Other (See Comments)    headache  . Lamictal [Lamotrigine]     Destroys thyroid  . Lithium Other (See Comments)    Adverse reaction to Thyroid   . Magnesium-Containing Compounds Hives  . Metronidazole   . Nabumetone Swelling  . Naproxen Swelling  . Other     Hair dye  . Penicillins Nausea And Vomiting    Has patient had a PCN reaction causing immediate rash, facial/tongue/throat swelling, SOB or lightheadedness with hypotension: Yes Has patient had a PCN reaction causing severe rash involving mucus membranes or skin necrosis: No Has patient had a PCN reaction that required hospitalization Yes Has patient had a PCN reaction occurring within the last 10 years: No If all of the above answers are "NO", then may proceed with Cephalosporin use.   Marland Kitchen Pentazocine Lactate Other (See Comments)    Unknown reaction  . Risperidone Other (See Comments)    Insomnia   . Septra Ds [Sulfamethoxazole-Trimethoprim]   . Seroquel [Quetiapine] Swelling  . Tegretol [Carbamazepine] Swelling  . Clonidine Derivatives     Dizziness at 0.1 mg  . Tetracycline Rash    Headaches  . Zyprexa [Olanzapine] Rash and Other (See Comments)    Insomnia    Metabolic Disorder Labs: No results found for: HGBA1C,  MPG No results found for: PROLACTIN No results found for: CHOL, TRIG, HDL, CHOLHDL, VLDL, LDLCALC Lab Results  Component Value Date   TSH 0.888 07/07/2010    Therapeutic Level Labs: Lab Results  Component Value Date   LITHIUM 1.27 10/25/2013   No results found for: VALPROATE No components found for:  CBMZ  Current Medications: Current Outpatient Medications  Medication Sig Dispense Refill  . albuterol (PROVENTIL HFA;VENTOLIN HFA) 108 (90 Base) MCG/ACT inhaler Inhale 1-2 puffs into the lungs every 6 (six) hours as needed for wheezing or shortness  of breath. 1 Inhaler 0  . aspirin 325 MG tablet Take 325 mg by mouth daily.    . budesonide (PULMICORT) 1 MG/2ML nebulizer solution Inhale into the lungs.    . Calcium Carbonate (CALCIUM 600 PO) Take 1 tablet by mouth 2 (two) times daily.    . Cholecalciferol (VITAMIN D-1000 MAX ST) 25 MCG (1000 UT) tablet Take by mouth.    . diclofenac sodium (VOLTAREN) 1 % GEL SMARTSIG:2-4 Gram(s) Topical Daily    . dicyclomine (BENTYL) 10 MG capsule Take 1 capsule by mouth 3 (three) times daily.    . Dupilumab (DUPIXENT) 300 MG/2ML SOPN Inject into the skin.    Marland Kitchen EPINEPHrine 0.3 mg/0.3 mL IJ SOAJ injection INJECT (0.3)ML INTO THEUMUSCLE ONCE AS NEEDED FORNANAPHYLAXIS FOR ONE DOSE.    . ferrous sulfate 220 (44 Fe) MG/5ML solution Take 220 mg by mouth 2 (two) times a day.    . fexofenadine (ALLEGRA) 180 MG tablet Take 180 mg by mouth daily.    . hydrOXYzine (ATARAX/VISTARIL) 25 MG tablet Take 1 tablet (25 mg total) by mouth 3 (three) times daily as needed for anxiety. 90 tablet 1  . ketoconazole (NIZORAL) 2 % cream MIX WITH FLUTICASONE CREAM AND APPLY TO AFFECTED AREA UP TO TWICE DAILY AS NEEDED. 30 g 0  . levETIRAcetam (KEPPRA) 750 MG tablet Take 1 tablet by mouth daily.    Marland Kitchen levothyroxine (SYNTHROID) 50 MCG tablet Take 1 tablet by mouth daily.    Marland Kitchen loratadine (CLARITIN) 10 MG tablet Take 10 mg by mouth daily.    . Magnesium 250 MG TABS Take by mouth.     . metoprolol tartrate (LOPRESSOR) 25 MG tablet Take 0.5 tablets by mouth 2 (two) times a day.    . metroNIDAZOLE (FLAGYL) 500 MG tablet Take 1 tablet (500 mg total) by mouth 3 (three) times daily. 21 tablet 0  . Multiple Vitamin (MULTIVITAMIN WITH MINERALS) TABS tablet Take 1 tablet by mouth daily.    . naproxen (NAPROSYN) 500 MG tablet Take 500 mg by mouth 2 (two) times daily with a meal.    . oxyCODONE-acetaminophen (PERCOCET/ROXICET) 5-325 MG tablet Take 1-2 tablets by mouth every 6 (six) hours as needed for severe pain. 7 tablet 0  . pantoprazole (PROTONIX) 40 MG tablet Take 1 tablet by mouth daily.    . Red Yeast Rice 600 MG CAPS Take 1 capsule by mouth 2 (two) times daily.    . Selenium 200 MCG TABS Take by mouth daily.    . simvastatin (ZOCOR) 40 MG tablet Take by mouth.    . sodium chloride (OCEAN) 0.65 % SOLN nasal spray Place 1 spray into both nostrils as needed for congestion.    . TRELEGY ELLIPTA 100-62.5-25 MCG/INH AEPB     . vortioxetine HBr (TRINTELLIX) 20 MG TABS tablet Take 1 tablet (20 mg total) by mouth daily. 90 tablet 0  . ziprasidone (GEODON) 80 MG capsule Take 1 capsule (80 mg total) by mouth 2 (two) times daily with a meal. 60 capsule 1   No current facility-administered medications for this visit.    Psychiatric Specialty Exam: Review of Systems  There were no vitals taken for this visit.There is no height or weight on file to calculate BMI.  General Appearance: unable to assess due to phone visit  Eye Contact:  unable to assess due to phone visit  Speech:  Clear and Coherent and Normal Rate  Volume:  Normal  Mood:  depressed  Affect:  Congruent  Thought  Process:  Goal Directed, Linear and Descriptions of Associations: Intact  Orientation:  Full (Time, Place, and Person)  Thought Content: Logical   Suicidal Thoughts:  No  Homicidal Thoughts:  No  Memory:  Recent;   Good Remote;   Good  Judgement:  Good  Insight:  Good  Psychomotor Activity:  Normal   Concentration:  Concentration: Good and Attention Span: Good  Recall:  Good  Fund of Knowledge: Good  Language: Good  Akathisia:  Negative  Handed:  Right  AIMS (if indicated): not done  Assets:  Communication Skills Desire for Improvement Financial Resources/Insurance Housing  ADL's:  Intact  Cognition: WNL  Sleep:  Fair     Screenings: PHQ2-9   Flowsheet Row Office Visit from 07/28/2016 in [facility] Office Visit from 06/15/2016 in [facility]  PHQ-2 Total Score 6 6  PHQ-9 Total Score 14 14       Assessment and Plan: Patient seems to be doing much better after hydroxyzine was started to help her with anxiety.  We will continue the same regimen for now.   1. Bipolar 1 disorder, depressed, moderate (HCC)  - hydrOXYzine (ATARAX/VISTARIL) 25 MG tablet; Take 1 tablet (25 mg total) by mouth 3 (three) times daily as needed for anxiety.  Dispense: 90 tablet; Refill: 1 - ziprasidone (GEODON) 80 MG capsule; Take 1 capsule (80 mg total) by mouth 2 (two) times daily with a meal.  Dispense: 60 capsule; Refill: 1 - vortioxetine HBr (TRINTELLIX) 20 MG TABS tablet; Take 1 tablet (20 mg total) by mouth daily.  Dispense: 90 tablet; Refill: 0  2. PTSD (post-traumatic stress disorder)  - vortioxetine HBr (TRINTELLIX) 20 MG TABS tablet; Take 1 tablet (20 mg total) by mouth daily.  Dispense: 90 tablet; Refill: 0    Continue same medications. F/up in 2 months.  Zena Amos, MD 08/07/2020 2:31 PM

## 2020-08-26 ENCOUNTER — Ambulatory Visit: Payer: Medicare Other | Admitting: Licensed Clinical Social Worker

## 2020-08-26 ENCOUNTER — Other Ambulatory Visit: Payer: Self-pay

## 2020-08-28 ENCOUNTER — Other Ambulatory Visit (HOSPITAL_COMMUNITY): Payer: Self-pay | Admitting: Psychiatry

## 2020-08-28 DIAGNOSIS — F3132 Bipolar disorder, current episode depressed, moderate: Secondary | ICD-10-CM

## 2020-09-03 ENCOUNTER — Ambulatory Visit
Payer: Medicare Other | Attending: Student in an Organized Health Care Education/Training Program | Admitting: Student in an Organized Health Care Education/Training Program

## 2020-09-03 ENCOUNTER — Other Ambulatory Visit: Payer: Self-pay

## 2020-09-03 ENCOUNTER — Encounter: Payer: Self-pay | Admitting: Student in an Organized Health Care Education/Training Program

## 2020-09-03 VITALS — BP 122/75 | HR 60 | Temp 97.9°F | Resp 16 | Ht 66.0 in | Wt 167.0 lb

## 2020-09-03 DIAGNOSIS — M25562 Pain in left knee: Secondary | ICD-10-CM | POA: Diagnosis present

## 2020-09-03 DIAGNOSIS — M47816 Spondylosis without myelopathy or radiculopathy, lumbar region: Secondary | ICD-10-CM | POA: Diagnosis not present

## 2020-09-03 DIAGNOSIS — G894 Chronic pain syndrome: Secondary | ICD-10-CM | POA: Diagnosis present

## 2020-09-03 DIAGNOSIS — G8929 Other chronic pain: Secondary | ICD-10-CM | POA: Diagnosis present

## 2020-09-03 DIAGNOSIS — M533 Sacrococcygeal disorders, not elsewhere classified: Secondary | ICD-10-CM | POA: Diagnosis present

## 2020-09-03 DIAGNOSIS — M461 Sacroiliitis, not elsewhere classified: Secondary | ICD-10-CM

## 2020-09-03 DIAGNOSIS — M51369 Other intervertebral disc degeneration, lumbar region without mention of lumbar back pain or lower extremity pain: Secondary | ICD-10-CM

## 2020-09-03 DIAGNOSIS — M47818 Spondylosis without myelopathy or radiculopathy, sacral and sacrococcygeal region: Secondary | ICD-10-CM

## 2020-09-03 DIAGNOSIS — M25559 Pain in unspecified hip: Secondary | ICD-10-CM | POA: Diagnosis present

## 2020-09-03 DIAGNOSIS — M5136 Other intervertebral disc degeneration, lumbar region: Secondary | ICD-10-CM

## 2020-09-03 NOTE — Progress Notes (Signed)
Safety precautions to be maintained throughout the outpatient stay will include: orient to surroundings, keep bed in low position, maintain call bell within reach at all times, provide assistance with transfer out of bed and ambulation.  

## 2020-09-03 NOTE — Progress Notes (Signed)
PROVIDER NOTE: Information contained herein reflects review and annotations entered in association with encounter. Interpretation of such information and data should be left to medically-trained personnel. Information provided to patient can be located elsewhere in the medical record under "Patient Instructions". Document created using STT-dictation technology, any transcriptional errors that may result from process are unintentional.    Patient: Kristen Ryan  Service Category: E/M  Provider: Gillis Santa, MD  DOB: 1956-02-15  DOS: 09/03/2020  Specialty: Interventional Pain Management  MRN: [id number]  Setting: Ambulatory outpatient  PCP: Leonel Ramsay, MD  Type: Established Patient    Referring Provider: Leonel Ramsay, MD  Location: Office  Delivery: Face-to-face     HPI  Kristen Ryan, a 65 y.o. year old female, is here today because of her Lumbar facet arthropathy [M47.816]. [name] primary complain today is Back Pain (Thoracic to lumbar area s/p fall in 89, broken vertebrae, placed in brace x 6 months ) and Migraine (Currently ) Last encounter: My last encounter with her was on 08/06/2020. Pertinent problems: Kristen Ryan has PTSD (post-traumatic stress disorder); Lumbar facet arthropathy; Lumbar degenerative disc disease; Cervical facet joint syndrome; Sacroiliac joint pain; Bipolar 1 disorder, depressed, moderate ([place]); Chronic pain syndrome; SI joint arthritis; Chronic pain of left knee; and Hip pain on their pertinent problem list. Pain Assessment: Severity of Chronic pain is reported as a 7 /10. Location: Back Mid,Lower,Left,Right/into both legs, unsure if this is coming from back pain or peripheral neuropathy. Onset: More than a month ago. Quality:  (grabbing sensation). Timing: Constant. Modifying factor(s): years ago was placed on dilaudid and that did seem to help. now taking percocet, heat. Vitals:  height is _0  (1.676 m) and weight is 167 lb (75.8 kg). Her temporal  temperature is 97.9 F (36.6 C). Her blood pressure is 122/75 and her pulse is 60. Her respiration is 16 and oxygen saturation is 96%.   Reason for encounter: follow-up evaluation   Patient follows up today for worsening back pain that is more pronounced in her lumbar spine overlying her SI joints related to osteoarthritis and osteoporosis.  Her initial clinic visit with me was on 06/20/2019,  See HPI below from that visit: Patient is a 65 year old female who presents with a chief complaint of diffuse back pain throughout her entire spine most pronounced at her lower lumbar spine.  She believes that this is due to osteoporosis and osteoarthritis.  She states that the primary reason for her referral is to continue her opioid analgesics, Percocet.  She states she recently transitioned primary care providers from [place] to [facility].  She has done PT in the past. She is not interested in interventional therapies, primarily having her Percocet continued.  She does have a significant psychiatric history + bipolar, schizophrenia, PTSD, depression  A&P from 06/20/19:  I had extensive discussion with the patient about the goals of pain management.  We discussed nonpharmacological approaches to pain management that include physical therapy, dieting, sleep hygiene, psychotherapy, interventional therapy.  We discussed the importance of understanding the type of pain including neuropathic, nociceptive, centralized.  I also stressed the importance of multimodal analgesia with an emphasis on nondrug modalities including self management, behavioral health support and physical therapy.  We discussed the importance of physical therapy and how a individualized physical therapy and occupational therapy program tailored to patient limitations can be helpful at improving physical function. We also discussed the importance of insomnia and disrupted sleep and how improved sleep hygiene and  cognitive therapy could be  helpful.  Psychotherapy including CBT, mind-body therapies, pain coping strategies can be helpful for patients whose pain impacts mood, sleep, quality of life, relationships with others.  We discussed avoiding benzodiazepines.  I also had an extensive discussion with the patient about interventional therapies which is my expertise and how these could be incorporated into an effective multimodal pain management plan.  At this point, I have limited options for the patient.  I informed her directly that I do not feel comfortable taking over her opioid therapy in the context of her bipolar disorder.  I instructed the patient that I usually do not take patients on for medication management unless we also explore interventional options and other pain modalities.  Patient does have significant lumbar facet arthropathy and I did discuss diagnostic lumbar facet medial branch nerve blocks with her.  We also discussed alternative behavioral therapies such as CBT.  Patient however is only interested in continuation of her opioid medications which unfortunately I would not be able to assist with as patient is high risk given her psych history in the context of opioid dependency.  She follows up today to discuss options for her chronic low back pain.   ROS  Constitutional: Denies any fever or chills Gastrointestinal: No reported hemesis, hematochezia, vomiting, or acute GI distress Musculoskeletal: Low back, bilateral hip, SI joint pain Neurological: No reported episodes of acute onset apraxia, aphasia, dysarthria, agnosia, amnesia, paralysis, loss of coordination, or loss of consciousness  Medication Review  Calcium Carbonate, Cholecalciferol, Dupilumab, EPINEPHrine, Fluticasone-Umeclidin-Vilant, Magnesium, Red Yeast Rice, albuterol, aspirin, budesonide, diclofenac sodium, dicyclomine, ferrous sulfate, fexofenadine, gabapentin, hydrOXYzine, hydrochlorothiazide, ketoconazole, levETIRAcetam, levothyroxine,  loratadine, meclizine, metoprolol tartrate, metroNIDAZOLE, multivitamin with minerals, naproxen, ondansetron, oxyCODONE-acetaminophen, pantoprazole, rosuvastatin, selenium, simvastatin, sodium chloride, vitamin E, vortioxetine HBr, and ziprasidone  History Review  Allergy: Ms. Martine is allergic to aminophylline, sumatriptan, theophylline, codeine, depakote [divalproex sodium], divalproex sodium, doxycycline, lamictal [lamotrigine], lithium, magnesium-containing compounds, metronidazole, nabumetone, naproxen, other, penicillins, pentazocine lactate, risperidone, septra ds [sulfamethoxazole-trimethoprim], seroquel [quetiapine], tegretol [carbamazepine], clonidine derivatives, tetracycline, and zyprexa [olanzapine]. Drug: Ms. Keats  reports no history of drug use. Alcohol:  reports no history of alcohol use. Tobacco:  reports that she quit smoking about 12 years ago. Her smoking use included cigarettes. She smoked 4.00 packs per day. She has never used smokeless tobacco. Social: Ms. Waites  reports that she quit smoking about 12 years ago. Her smoking use included cigarettes. She smoked 4.00 packs per day. She has never used smokeless tobacco. She reports that she does not drink alcohol and does not use drugs. Medical:  has a past medical history of Anemia, Asthma, Bipolar 1 disorder ([place]), Bronchitis, COPD (chronic obstructive pulmonary disease) ([place]), Diverticulitis, IBS (irritable bowel syndrome), Migraine, Neuropathy, Schizophrenia ([place]), and Thyroid disease. Surgical: Ms. Tirone  has a past surgical history that includes Abdominal hysterectomy; Tonsillectomy; Knee surgery; Elbow surgery; Foot surgery; Cesarean section; broke left arm; and Colonoscopy (N/A, 08/30/2014). Family: family history includes Bipolar disorder in her mother; Dementia in her father and mother; Other in her son.  Laboratory Chemistry Profile   Renal Lab Results  Component Value Date   BUN 21 09/07/2019   CREATININE 1.03 (H)  09/07/2019   GFRAA >60 09/07/2019   GFRNONAA 58 (L) 09/07/2019     Hepatic Lab Results  Component Value Date   AST 20 09/07/2019   ALT 16 09/07/2019   ALBUMIN 3.5 09/07/2019   ALKPHOS 95 09/07/2019   LIPASE 26 09/07/2019  Electrolytes Lab Results  Component Value Date   NA 141 09/07/2019   K 3.8 09/07/2019   CL 101 09/07/2019   CALCIUM 9.4 09/07/2019   MG 1.9 08/03/2015     Bone No results found for: VD25OH, [id number], [id number], NT7001VC9, 25OHVITD1, 25OHVITD2, 25OHVITD3, TESTOFREE, TESTOSTERONE   Inflammation (CRP: Acute Phase) (ESR: Chronic Phase) Lab Results  Component Value Date   LATICACIDVEN 1.8 08/03/2015       Note: Above Lab results reviewed.  Recent Imaging Review  CT ABDOMEN PELVIS W CONTRAST CLINICAL DATA:  Abdominal pain with diverticulitis suspected.  EXAM: CT ABDOMEN AND PELVIS WITH CONTRAST  TECHNIQUE: Multidetector CT imaging of the abdomen and pelvis was performed using the standard protocol following bolus administration of intravenous contrast.  CONTRAST:  133m OMNIPAQUE IOHEXOL 300 MG/ML  SOLN  COMPARISON:  CT of the pelvis dated March 01, 2016.  FINDINGS: Lower chest: The heart size is mildly enlarged. There is stable scarring versus chronic atelectasis in the lingula.  Hepatobiliary: No focal liver abnormality is seen. No gallstones, gallbladder wall thickening, or biliary dilatation.  Pancreas: Unremarkable. No pancreatic ductal dilatation or surrounding inflammatory changes.  Spleen: Normal in size without focal abnormality.  Adrenals/Urinary Tract: Adrenal glands are unremarkable. Kidneys are normal, without renal calculi, focal lesion, or hydronephrosis. Bladder is unremarkable.  Stomach/Bowel: There is a small hiatal hernia, otherwise the stomach is unremarkable. There is some mildly dilated loops of small bowel scattered throughout the abdomen without evidence of a high-grade small bowel obstruction. There is  evidence for acute uncomplicated sigmoid diverticulitis. There is no adjacent abscess. There is a large amount of stool throughout the colon. The appendix is unremarkable but contains multiple appendicular list. There is some stasis within the distal small bowel.  Vascular/Lymphatic: Aortic atherosclerosis. No enlarged abdominal or pelvic lymph nodes.  Reproductive: Status post hysterectomy. No adnexal masses.  Other: No abdominal wall hernia or abnormality. No abdominopelvic ascites.  Musculoskeletal: No acute or significant osseous findings.  IMPRESSION: 1. Findings consistent with acute uncomplicated sigmoid diverticulitis. 2. Mild cardiomegaly. 3.  Aortic Atherosclerosis (ICD10-I70.0).  Electronically Signed   By: CConstance HolsterM.D.   On: 12/31/2018 23:06 Note: Reviewed        Physical Exam  General appearance: Well nourished, well developed, and well hydrated. In no apparent acute distress Mental status: Alert, oriented x 3 (person, place, & time)       Respiratory: No evidence of acute respiratory distress Eyes: PERLA Vitals: BP 122/75 (BP Location: Right Arm, Patient Position: Sitting, Cuff Size: Normal)   Pulse 60   Temp 97.9 F (36.6 C) (Temporal)   Resp 16   Ht _0  (1.676 m)   Wt 167 lb (75.8 kg)   SpO2 96% Comment: COPD stage 4, asthma  BMI 26.95 kg/m  BMI: Estimated body mass index is 26.95 kg/m as calculated from the following:   Height as of this encounter: _1  (1.676 m).   Weight as of this encounter: 167 lb (75.8 kg). Ideal: Ideal body weight: 59.3 kg (130 lb 11.7 oz) Adjusted ideal body weight: 65.9 kg (145 lb 3.8 oz)  Lumbar Spine Area Exam  Skin & Axial Inspection: No masses, redness, or swelling Alignment: Symmetrical Functional ROM: Pain restricted ROM affecting both sides Stability: No instability detected Muscle Tone/Strength: Functionally intact. No obvious neuro-muscular anomalies detected. Sensory (Neurological):  Musculoskeletal pain pattern Palpation: No palpable anomalies       Provocative Tests: Hyperextension/rotation test: (+) bilaterally for facet joint pain. Lumbar  quadrant test (Kemp's test): (+) bilaterally for facet joint pain. Lateral bending test: (+) ipsilateral radicular pain, bilaterally. Positive for bilateral foraminal stenosis. Patrick's Maneuver: (+) for bilateral S-I arthralgia              *(Flexion, ABduction and External Rotation) Gait & Posture Assessment  Ambulation: Unassisted Gait: Age-related, senile gait pattern Posture: Difficulty standing up straight, due to pain  Lower Extremity Exam    Side: Right lower extremity  Side: Left lower extremity  Stability: No instability observed          Stability: No instability observed          Skin & Extremity Inspection: Skin color, temperature, and hair growth are WNL. No peripheral edema or cyanosis. No masses, redness, swelling, asymmetry, or associated skin lesions. No contractures.  Skin & Extremity Inspection: Skin color, temperature, and hair growth are WNL. No peripheral edema or cyanosis. No masses, redness, swelling, asymmetry, or associated skin lesions. No contractures.  Functional ROM: Mechanically restricted ROM for hip and knee joints          Functional ROM: Pain restricted ROM for hip and knee joints          Muscle Tone/Strength: Functionally intact. No obvious neuro-muscular anomalies detected.  Muscle Tone/Strength: Functionally intact. No obvious neuro-muscular anomalies detected.  Sensory (Neurological): Unimpaired        Sensory (Neurological): Unimpaired        DTR: Patellar: deferred today Achilles: deferred today Plantar: deferred today  DTR: Patellar: deferred today Achilles: deferred today Plantar: deferred today  Palpation: No palpable anomalies  Palpation: No palpable anomalies   Assessment   Status Diagnosis  Worsening Worsening Worsening 1. Lumbar facet arthropathy   2. Lumbar spondylosis    3. Sacroiliac joint pain   4. Lumbar degenerative disc disease   5. SI joint arthritis   6. Hip pain   7. Chronic pain of left knee   8. Chronic pain syndrome      Updated Problems: Problem  Chronic Pain Syndrome  Si Joint Arthritis  Chronic Pain of Left Knee  Hip Pain  Bipolar 1 Disorder, Depressed, Moderate ([facility])  Lumbar Facet Arthropathy  Lumbar Degenerative Disc Disease  Cervical Facet Joint Syndrome  Sacroiliac Joint Pain  Ptsd (Post-Traumatic Stress Disorder)    Plan of Care   Ms. Baljit YOSELINE ANDERSSON has a current medication list which includes the following long-term medication(s): albuterol, budesonide, calcium carbonate, dicyclomine, ferrous sulfate, hydrochlorothiazide, levothyroxine, metoprolol tartrate, rosuvastatin, ziprasidone, fexofenadine, and levetiracetam.   TIMEKA GOETTE has a history of greater than 3 months of moderate to severe pain which is resulted in functional impairment.  The patient has tried various conservative therapeutic options such as NSAIDs, Tylenol, muscle relaxants, physical therapy which was inadequately effective.  Patient's pain is predominantly axial with physical exam findings suggestive of facet arthropathy.Lumbar facet medial branch nerve blocks were discussed with the patient.  Risks and benefits were reviewed.  Patient would like to proceed with bilateral L3, L4, L5 medial branch nerve block.  For her left knee pain related to knee osteoarthritis that was nonresponsive to left knee intra-articular steroid injection done with PCP, discussed left knee Hyalgan viscosupplementation as well as genicular nerve block and possible radiofrequency ablation.  We will start with Hyalgan series first.  If not effective can consider genicular nerve block/RFA.  X-rays of lumbar spine, bilateral hips and SI joints as below.  Patient may need follow-up lumbar MRI depending upon how she does with  diagnostic lumbar facet medial branch nerve blocks.  In  regards to medication management, we will focus on interventional therapies initially especially given her comorbid psychiatric illness.  Do not recommend high-dose opioid therapy for her.  Patient may be a candidate for buprenorphine for chronic pain management.  Will obtain UDS.  Encouraged her to continue with Dr. Toy Care with psychiatry.  Orders:  Orders Placed This Encounter  Procedures  . LUMBAR FACET(MEDIAL BRANCH NERVE BLOCK) MBNB    Standing Status:   Future    Standing Expiration Date:   10/04/2020    Scheduling Instructions:     Procedure: Lumbar facet block (AKA.: Lumbosacral medial branch nerve block)     Side: Bilateral     Level: L3-4, L4-5, Facets (L3, L4, L5,Medial Branch Nerves)     Sedation: Patient's choice.     Timeframe: ASAA    Order Specific Question:   Where will this procedure be performed?    Answer:   [facility]  . KNEE INJECTION    Hyalgan knee injection. Please order Hyalgan.    Standing Status:   Future    Standing Expiration Date:   10/04/2020    Scheduling Instructions:     Procedure: Intra-articular Hyalgan Knee injection            Side:LEFT     Sedation: None     Timeframe: in two (2) weeks    Order Specific Question:   Where will this procedure be performed?    Answer:   [facility]  . DG Lumbar Spine Complete W/Bend    Patient presents with axial pain with possible radicular component.  In addition to any acute findings, please report on:  1. Facet (Zygapophyseal) joint DJD (Hypertrophy, space narrowing, subchondral sclerosis, and/or osteophyte formation) 2. DDD and/or IVDD (Loss of disc height, desiccation or "Black disc disease") 3. Pars defects 4. Spondylolisthesis, spondylosis, and/or spondyloarthropathies (include Degree/Grade of displacement in mm) 5. Vertebral body Fractures, including age (old, new/acute) 61. Modic Type Changes 7. Demineralization 8. Bone pathology 9. Central, Lateral Recess, and/or Foraminal Stenosis  (include AP diameter of stenosis in mm) 10. Surgical changes (hardware type, status, and presence of fibrosis)  NOTE: Please specify level(s) and laterality. If applicable: Please indicate ROM and/or evidence of instability (>51m displacement between flexion and extension views)    Standing Status:   Future    Standing Expiration Date:   12/04/2020    Order Specific Question:   Reason for Exam (SYMPTOM  OR DIAGNOSIS REQUIRED)    Answer:   Low back pain    Order Specific Question:   Preferred imaging location?    Answer:   [facility]    Order Specific Question:   Call Results- Best Contact Number?    Answer:   [phone]([facility]    Order Specific Question:   Radiology Contrast Protocol - do NOT remove file path    Answer:   [email or web address]  . DG Si Joints    Standing Status:   Future    Standing Expiration Date:   12/04/2020    Order Specific Question:   Reason for Exam (SYMPTOM  OR DIAGNOSIS REQUIRED)    Answer:   Sacroiliac joint pain    Order Specific Question:   Preferred imaging location?    Answer:   [facility]    Order Specific Question:   Call Results- Best Contact Number?    Answer:   [phone]([facility]  . DG HIP  UNILAT W OR W/O PELVIS 2-3 VIEWS RIGHT    Standing Status:   Future    Standing Expiration Date:   09/03/2021    Scheduling Instructions:     Please describe any evidence of DJD, such as joint narrowing, asymmetry, cysts, or any anomalies in bone density, production, or erosion.    Order Specific Question:   Reason for Exam (SYMPTOM  OR DIAGNOSIS REQUIRED)    Answer:   Sacroiliac joint pain    Order Specific Question:   Preferred imaging location?    Answer:   [facility]    Order Specific Question:   Call Results- Best Contact Number?    Answer:   [phone] (Pain Clinic facility) (Dr. Dossie Arbour)  . DG HIP UNILAT W OR W/O PELVIS 2-3 VIEWS LEFT    Standing Status:   Future     Standing Expiration Date:   09/03/2021    Scheduling Instructions:     Please describe any evidence of DJD, such as joint narrowing, asymmetry, cysts, or any anomalies in bone density, production, or erosion.    Order Specific Question:   Reason for Exam (SYMPTOM  OR DIAGNOSIS REQUIRED)    Answer:   Sacroiliac joint pain    Order Specific Question:   Preferred imaging location?    Answer:   [facility]    Order Specific Question:   Call Results- Best Contact Number?    Answer:   [phone] (Pain Clinic facility) (Dr. Dossie Arbour)  . Compliance Drug Analysis, Ur    Volume: 30 ml(s). Minimum 3 ml of urine is needed. Document temperature of fresh sample. Indications: Long term (current) use of opiate analgesic (H99.672) Test#: [phone] (Comprehensive Profile)    Order Specific Question:   Release to patient    Answer:   Immediate   Follow-up plan:   Return in about 2 weeks (around 09/17/2020) for B/L L3,4,5 Fct Block (w sedation) + left knee hyalgan.   Recent Visits No visits were found meeting these conditions. Showing recent visits within past 90 days and meeting all other requirements Today's Visits Date Type Provider Dept  09/03/20 Office Visit Gillis Santa, MD [facility]  Showing today's visits and meeting all other requirements Future Appointments No visits were found meeting these conditions. Showing future appointments within next 90 days and meeting all other requirements  I discussed the assessment and treatment plan with the patient. The patient was provided an opportunity to ask questions and all were answered. The patient agreed with the plan and demonstrated an understanding of the instructions.  Patient advised to call back or seek an in-person evaluation if the symptoms or condition worsens.  Duration of encounter: 25mnutes.  Note by: BGillis Santa MD Date: 09/03/2020; Time: 1:34 PM

## 2020-09-03 NOTE — Addendum Note (Signed)
Addended by: Edward Jolly on: 09/03/2020 01:45 PM   Modules accepted: Level of Service

## 2020-09-03 NOTE — Patient Instructions (Addendum)
You will have XRays completed prior to next appt with pain clinic.  Sodium Hyaluronate intra-articular injection What is this medicine? SODIUM HYALURONATE (SOE dee um hye al yoor ON ate) is used to treat pain in the knee due to osteoarthritis. This medicine may be used for other purposes; ask your health care provider or pharmacist if you have questions. COMMON BRAND NAME(S): Amvisc, DUROLANE, Euflexxa, GELSYN-3, Hyalgan, Hymovis, Monovisc, Orthovisc, Supartz, Supartz FX, TriVisc, VISCO What should I tell my health care provider before I take this medicine? They need to know if you have any of these conditions:  bleeding disorders  glaucoma  infection in the knee joint  skin conditions or sensitivity  skin infection  an unusual allergic reaction to sodium hyaluronate, other medicines, foods, dyes, or preservatives. Different brands of sodium hyaluronate contain different allergens. Some may contain egg. Talk to your doctor about your allergies to make sure that you get the right product.  pregnant or trying to get pregnant  breast-feeding How should I use this medicine? This medicine is for injection into the knee joint. It is given by a health care professional in a hospital or clinic setting. Talk to your pediatrician regarding the use of this medicine in children. Special care may be needed. Overdosage: If you think you have taken too much of this medicine contact a poison control center or emergency room at once. NOTE: This medicine is only for you. Do not share this medicine with others. What if I miss a dose? This does not apply. What may interact with this medicine? Interactions are not expected. This list may not describe all possible interactions. Give your health care provider a list of all the medicines, herbs, non-prescription drugs, or dietary supplements you use. Also tell them if you smoke, drink alcohol, or use illegal drugs. Some items may interact with your  medicine. What should I watch for while using this medicine? Tell your doctor or healthcare professional if your symptoms do not start to get better or if they get worse. If receiving this medicine for osteoarthritis, limit your activity after you receive your injection. Avoid physical activity for 48 hours following your injection to keep your knee from swelling. Do not stand on your feet for more than 1 hour at a time during the first 48 hours following your injection. Ask your doctor or healthcare professional about when you can begin major physical activity again. What side effects may I notice from receiving this medicine? Side effects that you should report to your doctor or health care professional as soon as possible:  allergic reactions like skin rash, itching or hives, swelling of the face, lips, or tongue  dizziness  facial flushing  pain, tingling, numbness in the hands or feet  vision changes if received this medicine during eye surgery Side effects that usually do not require medical attention (report to your doctor or health care professional if they continue or are bothersome):  back pain  bruising at site where injected  chills  diarrhea  fever  headache  joint pain  joint stiffness  joint swelling  muscle cramps  muscle pain  nausea, vomiting  pain, redness, or irritation at site where injected  weak or tired This list may not describe all possible side effects. Call your doctor for medical advice about side effects. You may report side effects to FDA at 1-800-FDA-1088. Where should I keep my medicine? This drug is given in a hospital or clinic and will not be  stored at home. NOTE: This sheet is a summary. It may not cover all possible information. If you have questions about this medicine, talk to your doctor, pharmacist, or health care provider.  2021 Elsevier/Gold Standard (2015-07-25 08:34:51)   Moderate Conscious Sedation, Adult Sedation is  the use of medicines to promote relaxation and to relieve discomfort and anxiety. Moderate conscious sedation is a type of sedation. Under moderate conscious sedation, you are less alert than normal, but you are still able to respond to instructions, touch, or both. Moderate conscious sedation is used during short medical and dental procedures. It is milder than deep sedation, which is a type of sedation under which you cannot be easily woken up. It is also milder than general anesthesia, which is the use of medicines to make you unconscious. Moderate conscious sedation allows you to return to your regular activities sooner. Tell a health care provider about:  Any allergies you have.  All medicines you are taking, including vitamins, herbs, eye drops, creams, and over-the-counter medicines.  Any use of steroids. This includes steroids taken by mouth or as a cream.  Any problems you or family members have had with sedatives and anesthetic medicines.  Any blood disorders you have.  Any surgeries you have had.  Any medical conditions you have, such as sleep apnea.  Whether you are pregnant or may be pregnant.  Any use of cigarettes, alcohol, marijuana, or drugs. What are the risks? Generally, this is a safe procedure. However, problems may occur, including:  Getting too much medicine (oversedation).  Nausea.  Allergic reaction to medicines.  Trouble breathing. If this happens, a breathing tube may be used. It will be removed when you are awake and breathing on your own.  Heart trouble.  Lung trouble.  Confusion that gets better with time (emergence delirium). What happens before the procedure? Staying hydrated Follow instructions from your health care provider about hydration, which may include:  Up to 2 hours before the procedure - you may continue to drink clear liquids, such as water, clear fruit juice, black coffee, and plain tea. Eating and drinking restrictions Follow  instructions from your health care provider about eating and drinking, which may include:  8 hours before the procedure - stop eating heavy meals or foods, such as meat, fried foods, or fatty foods.  6 hours before the procedure - stop eating light meals or foods, such as toast or cereal.  6 hours before the procedure - stop drinking milk or drinks that contain milk.  2 hours before the procedure - stop drinking clear liquids. Medicines Ask your health care provider about:  Changing or stopping your regular medicines. This is especially important if you are taking diabetes medicines or blood thinners.  Taking medicines such as aspirin and ibuprofen. These medicines can thin your blood. Do not take these medicines unless your health care provider tells you to take them.  Taking over-the-counter medicines, vitamins, herbs, and supplements. Tests and exams  You will have a physical exam.  You may have blood tests done to show how well: ? Your kidneys and liver work. ? Your blood clots. General instructions  Plan to have a responsible adult take you home from the hospital or clinic.  If you will be going home right after the procedure, plan to have a responsible adult care for you for the time you are told. This is important. What happens during the procedure?  You will be given the sedative. The sedative may be  given: ? As a pill that you will swallow. It can also be inserted into the rectum. ? As a spray through the nose. ? As an injection into the muscle. ? As an injection into the vein through an IV.  You may be given oxygen as needed.  Your breathing, heart rate, and blood pressure will be monitored during the procedure.  The medical or dental procedure will be done. The procedure may vary among health care providers and hospitals.   What happens after the procedure?  Your blood pressure, heart rate, breathing rate, and blood oxygen level will be monitored until you  leave the hospital or clinic.  You will get fluids through your IV if needed.  Do not drive or operate machinery until your health care provider says that it is safe. Summary  Sedation is the use of medicines to promote relaxation and to relieve discomfort and anxiety. Moderate conscious sedation is a type of sedation that is used during short medical and dental procedures.  Tell the health care provider about any medical conditions that you have and about all the medicines that you are taking.  You will be given the sedative as a pill, a spray through the nose, an injection into the muscle, or an injection into the vein through an IV. Vital signs are monitored during the sedation.  Moderate conscious sedation allows you to return to your regular activities sooner. This information is not intended to replace advice given to you by your health care provider. Make sure you discuss any questions you have with your health care provider. Document Revised: 10/20/2019 Document Reviewed: 05/18/2019 Elsevier Patient Education  2021 Elsevier Inc.    GENERAL RISKS AND COMPLICATIONS  What are the risk, side effects and possible complications? Generally speaking, most procedures are safe.  However, with any procedure there are risks, side effects, and the possibility of complications.  The risks and complications are dependent upon the sites that are lesioned, or the type of nerve block to be performed.  The closer the procedure is to the spine, the more serious the risks are.  Great care is taken when placing the radio frequency needles, block needles or lesioning probes, but sometimes complications can occur. 1. Infection: Any time there is an injection through the skin, there is a risk of infection.  This is why sterile conditions are used for these blocks.  There are four possible types of infection. 1. Localized skin infection. 2. Central Nervous System Infection-This can be in the form of  Meningitis, which can be deadly. 3. Epidural Infections-This can be in the form of an epidural abscess, which can cause pressure inside of the spine, causing compression of the spinal cord with subsequent paralysis. This would require an emergency surgery to decompress, and there are no guarantees that the patient would recover from the paralysis. 4. Discitis-This is an infection of the intervertebral discs.  It occurs in about 1% of discography procedures.  It is difficult to treat and it may lead to surgery.        2. Pain: the needles have to go through skin and soft tissues, will cause soreness.       3. Damage to internal structures:  The nerves to be lesioned may be near blood vessels or    other nerves which can be potentially damaged.       4. Bleeding: Bleeding is more common if the patient is taking blood thinners such as  aspirin, Coumadin, Ticiid, Plavix,  etc., or if he/she have some genetic predisposition  such as hemophilia. Bleeding into the spinal canal can cause compression of the spinal  cord with subsequent paralysis.  This would require an emergency surgery to  decompress and there are no guarantees that the patient would recover from the  paralysis.       5. Pneumothorax:  Puncturing of a lung is a possibility, every time a needle is introduced in  the area of the chest or upper back.  Pneumothorax refers to free air around the  collapsed lung(s), inside of the thoracic cavity (chest cavity).  Another two possible  complications related to a similar event would include: Hemothorax and Chylothorax.   These are variations of the Pneumothorax, where instead of air around the collapsed  lung(s), you may have blood or chyle, respectively.       6. Spinal headaches: They may occur with any procedures in the area of the spine.       7. Persistent CSF (Cerebro-Spinal Fluid) leakage: This is a rare problem, but may occur  with prolonged intrathecal or epidural catheters either due to the  formation of a fistulous  track or a dural tear.       8. Nerve damage: By working so close to the spinal cord, there is always a possibility of  nerve damage, which could be as serious as a permanent spinal cord injury with  paralysis.       9. Death:  Although rare, severe deadly allergic reactions known as "Anaphylactic  reaction" can occur to any of the medications used.      10. Worsening of the symptoms:  We can always make thing worse.  What are the chances of something like this happening? Chances of any of this occuring are extremely low.  By statistics, you have more of a chance of getting killed in a motor vehicle accident: while driving to the hospital than any of the above occurring .  Nevertheless, you should be aware that they are possibilities.  In general, it is similar to taking a shower.  Everybody knows that you can slip, hit your head and get killed.  Does that mean that you should not shower again?  Nevertheless always keep in mind that statistics do not mean anything if you happen to be on the wrong side of them.  Even if a procedure has a 1 (one) in a 1,000,000 (million) chance of going wrong, it you happen to be that one..Also, keep in mind that by statistics, you have more of a chance of having something go wrong when taking medications.  Who should not have this procedure? If you are on a blood thinning medication (e.g. Coumadin, Plavix, see list of "Blood Thinners"), or if you have an active infection going on, you should not have the procedure.  If you are taking any blood thinners, please inform your physician.  How should I prepare for this procedure?  Do not eat or drink anything at least six hours prior to the procedure.  Bring a driver with you .  It cannot be a taxi.  Come accompanied by an adult that can drive you back, and that is strong enough to help you if your legs get weak or numb from the local anesthetic.  Take all of your medicines the morning of the  procedure with just enough water to swallow them.  If you have diabetes, make sure that you are scheduled to have your procedure done first  thing in the morning, whenever possible.  If you have diabetes, take only half of your insulin dose and notify our nurse that you have done so as soon as you arrive at the clinic.  If you are diabetic, but only take blood sugar pills (oral hypoglycemic), then do not take them on the morning of your procedure.  You may take them after you have had the procedure.  Do not take aspirin or any aspirin-containing medications, at least eleven (11) days prior to the procedure.  They may prolong bleeding.  Wear loose fitting clothing that may be easy to take off and that you would not mind if it got stained with Betadine or blood.  Do not wear any jewelry or perfume  Remove any nail coloring.  It will interfere with some of our monitoring equipment.  NOTE: Remember that this is not meant to be interpreted as a complete list of all possible complications.  Unforeseen problems may occur.  BLOOD THINNERS The following drugs contain aspirin or other products, which can cause increased bleeding during surgery and should not be taken for 2 weeks prior to and 1 week after surgery.  If you should need take something for relief of minor pain, you may take acetaminophen which is found in Tylenol,m Datril, Anacin-3 and Panadol. It is not blood thinner. The products listed below are.  Do not take any of the products listed below in addition to any listed on your instruction sheet.  A.P.C or A.P.C with Codeine Codeine Phosphate Capsules #3 Ibuprofen Ridaura  ABC compound Congesprin Imuran rimadil  Advil Cope Indocin Robaxisal  Alka-Seltzer Effervescent Pain Reliever and Antacid Coricidin or Coricidin-D  Indomethacin Rufen  Alka-Seltzer plus Cold Medicine Cosprin Ketoprofen S-A-C Tablets  Anacin Analgesic Tablets or Capsules Coumadin Korlgesic Salflex  Anacin Extra Strength  Analgesic tablets or capsules CP-2 Tablets Lanoril Salicylate  Anaprox Cuprimine Capsules Levenox Salocol  Anexsia-D Dalteparin Magan Salsalate  Anodynos Darvon compound Magnesium Salicylate Sine-off  Ansaid Dasin Capsules Magsal Sodium Salicylate  Anturane Depen Capsules Marnal Soma  APF Arthritis pain formula Dewitt's Pills Measurin Stanback  Argesic Dia-Gesic Meclofenamic Sulfinpyrazone  Arthritis Bayer Timed Release Aspirin Diclofenac Meclomen Sulindac  Arthritis pain formula Anacin Dicumarol Medipren Supac  Analgesic (Safety coated) Arthralgen Diffunasal Mefanamic Suprofen  Arthritis Strength Bufferin Dihydrocodeine Mepro Compound Suprol  Arthropan liquid Dopirydamole Methcarbomol with Aspirin Synalgos  ASA tablets/Enseals Disalcid Micrainin Tagament  Ascriptin Doan's Midol Talwin  Ascriptin A/D Dolene Mobidin Tanderil  Ascriptin Extra Strength Dolobid Moblgesic Ticlid  Ascriptin with Codeine Doloprin or Doloprin with Codeine Momentum Tolectin  Asperbuf Duoprin Mono-gesic Trendar  Aspergum Duradyne Motrin or Motrin IB Triminicin  Aspirin plain, buffered or enteric coated Durasal Myochrisine Trigesic  Aspirin Suppositories Easprin Nalfon Trillsate  Aspirin with Codeine Ecotrin Regular or Extra Strength Naprosyn Uracel  Atromid-S Efficin Naproxen Ursinus  Auranofin Capsules Elmiron Neocylate Vanquish  Axotal Emagrin Norgesic Verin  Azathioprine Empirin or Empirin with Codeine Normiflo Vitamin E  Azolid Emprazil Nuprin Voltaren  Bayer Aspirin plain, buffered or children's or timed BC Tablets or powders Encaprin Orgaran Warfarin Sodium  Buff-a-Comp Enoxaparin Orudis Zorpin  Buff-a-Comp with Codeine Equegesic Os-Cal-Gesic   Buffaprin Excedrin plain, buffered or Extra Strength Oxalid   Bufferin Arthritis Strength Feldene Oxphenbutazone   Bufferin plain or Extra Strength Feldene Capsules Oxycodone with Aspirin   Bufferin with Codeine Fenoprofen Fenoprofen Pabalate or Pabalate-SF    Buffets II Flogesic Panagesic   Buffinol plain or Extra Strength Florinal or Florinal with Codeine Panwarfarin  Buf-Tabs Flurbiprofen Penicillamine   Butalbital Compound Four-way cold tablets Penicillin   Butazolidin Fragmin Pepto-Bismol   Carbenicillin Geminisyn Percodan   Carna Arthritis Reliever Geopen Persantine   Carprofen Gold's salt Persistin   Chloramphenicol Goody's Phenylbutazone   Chloromycetin Haltrain Piroxlcam   Clmetidine heparin Plaquenil   Cllnoril Hyco-pap Ponstel   Clofibrate Hydroxy chloroquine Propoxyphen         Before stopping any of these medications, be sure to consult the physician who ordered them.  Some, such as Coumadin (Warfarin) are ordered to prevent or treat serious conditions such as "deep thrombosis", "pumonary embolisms", and other heart problems.  The amount of time that you may need off of the medication may also vary with the medication and the reason for which you were taking it.  If you are taking any of these medications, please make sure you notify your pain physician before you undergo any procedures.    Facet Blocks Patient Information  Description: The facets are joints in the spine between the vertebrae.  Like any joints in the body, facets can become irritated and painful.  Arthritis can also effect the facets.  By injecting steroids and local anesthetic in and around these joints, we can temporarily block the nerve supply to them.  Steroids act directly on irritated nerves and tissues to reduce selling and inflammation which often leads to decreased pain.  Facet blocks may be done anywhere along the spine from the neck to the low back depending upon the location of your pain.   After numbing the skin with local anesthetic (like Novocaine), a small needle is passed onto the facet joints under x-ray guidance.  You may experience a sensation of pressure while this is being done.  The entire block usually lasts about 15-25 minutes.   Conditions  which may be treated by facet blocks:   Low back/buttock pain  Neck/shoulder pain  Certain types of headaches  Preparation for the injection:  15. Do not eat any solid food or dairy products within 8 hours of your appointment. 16. You may drink clear liquid up to 3 hours before appointment.  Clear liquids include water, black coffee, juice or soda.  No milk or cream please. 17. You may take your regular medication, including pain medications, with a sip of water before your appointment.  Diabetics should hold regular insulin (if taken separately) and take 1/2 normal NPH dose the morning of the procedure.  Carry some sugar containing items with you to your appointment. 18. A driver must accompany you and be prepared to drive you home after your procedure. 19. Bring all your current medications with you. 20. An IV may be inserted and sedation may be given at the discretion of the physician. 21. A blood pressure cuff, EKG and other monitors will often be applied during the procedure.  Some patients may need to have extra oxygen administered for a short period. 22. You will be asked to provide medical information, including your allergies and medications, prior to the procedure.  We must know immediately if you are taking blood thinners (like Coumadin/Warfarin) or if you are allergic to IV iodine contrast (dye).  We must know if you could possible be pregnant.  Possible side-effects:   Bleeding from needle site  Infection (rare, may require surgery)  Nerve injury (rare)  Numbness & tingling (temporary)  Difficulty urinating (rare, temporary)  Spinal headache (a headache worse with upright posture)  Light-headedness (temporary)  Pain at injection site (serveral days)  Decreased blood pressure (rare, temporary)  Weakness in arm/leg (temporary)  Pressure sensation in back/neck (temporary)   Call if you experience:   Fever/chills associated with headache or increased  back/neck pain  Headache worsened by an upright position  New onset, weakness or numbness of an extremity below the injection site  Hives or difficulty breathing (go to the emergency room)  Inflammation or drainage at the injection site(s)  Severe back/neck pain greater than usual  New symptoms which are concerning to you  Please note:  Although the local anesthetic injected can often make your back or neck feel good for several hours after the injection, the pain will likely return. It takes 3-7 days for steroids to work.  You may not notice any pain relief for at least one week.  If effective, we will often do a series of 2-3 injections spaced 3-6 weeks apart to maximally decrease your pain.  After the initial series, you may be a candidate for a more permanent nerve block of the facets.  If you have any questions, please call #[phone] [facility]

## 2020-09-04 ENCOUNTER — Other Ambulatory Visit (HOSPITAL_COMMUNITY): Payer: Self-pay | Admitting: Psychiatry

## 2020-09-04 DIAGNOSIS — F3132 Bipolar disorder, current episode depressed, moderate: Secondary | ICD-10-CM

## 2020-09-10 ENCOUNTER — Other Ambulatory Visit: Payer: Self-pay | Admitting: Student in an Organized Health Care Education/Training Program

## 2020-09-10 ENCOUNTER — Ambulatory Visit (HOSPITAL_COMMUNITY)
Admission: RE | Admit: 2020-09-10 | Discharge: 2020-09-10 | Disposition: A | Discharge status: Home / Self Care | Payer: Medicare Other | Source: Ambulatory Visit | Attending: Student in an Organized Health Care Education/Training Program | Admitting: Student in an Organized Health Care Education/Training Program

## 2020-09-10 DIAGNOSIS — M25559 Pain in unspecified hip: Secondary | ICD-10-CM

## 2020-09-10 DIAGNOSIS — M47816 Spondylosis without myelopathy or radiculopathy, lumbar region: Secondary | ICD-10-CM | POA: Insufficient documentation

## 2020-09-10 DIAGNOSIS — M47818 Spondylosis without myelopathy or radiculopathy, sacral and sacrococcygeal region: Secondary | ICD-10-CM | POA: Insufficient documentation

## 2020-09-10 LAB — COMPLIANCE DRUG ANALYSIS, UR

## 2020-09-16 ENCOUNTER — Ambulatory Visit: Payer: Medicare Other | Admitting: Student in an Organized Health Care Education/Training Program

## 2020-10-03 ENCOUNTER — Telehealth (INDEPENDENT_AMBULATORY_CARE_PROVIDER_SITE_OTHER): Payer: Medicare Other | Admitting: Psychiatry

## 2020-10-03 ENCOUNTER — Encounter (HOSPITAL_COMMUNITY): Payer: Self-pay | Admitting: Psychiatry

## 2020-10-03 ENCOUNTER — Other Ambulatory Visit: Payer: Self-pay

## 2020-10-03 DIAGNOSIS — F4310 Post-traumatic stress disorder, unspecified: Secondary | ICD-10-CM | POA: Diagnosis not present

## 2020-10-03 DIAGNOSIS — F3132 Bipolar disorder, current episode depressed, moderate: Secondary | ICD-10-CM

## 2020-10-03 MED ORDER — HYDROXYZINE HCL 25 MG PO TABS
25.0000 mg | ORAL_TABLET | Freq: Three times a day (TID) | ORAL | 0 refills | Status: DC | PRN
Start: 2020-10-03 — End: 2020-11-28

## 2020-10-03 MED ORDER — ZIPRASIDONE HCL 80 MG PO CAPS: 80.0000 mg | ORAL_CAPSULE | Freq: Two times a day (BID) | ORAL | 0 refills | Status: DC

## 2020-10-03 MED ORDER — VORTIOXETINE HBR 20 MG PO TABS
20.0000 mg | ORAL_TABLET | Freq: Every day | ORAL | 0 refills | Status: DC
Start: 2020-10-03 — End: 2020-12-19

## 2020-10-03 NOTE — Progress Notes (Signed)
BH MD OP Progress Note  Virtual Visit via Telephone Note  I connected with Kristen Ryan on 10/03/20 at  2:30 PM EDT by telephone and verified that I am speaking with the correct person using two identifiers.  Location: Patient: home Provider: Clinic   I discussed the limitations, risks, security and privacy concerns of performing an evaluation and management service by telephone and the availability of in person appointments. I also discussed with the patient that there may be a patient responsible charge related to this service. The patient expressed understanding and agreed to proceed.   I provided 15 minutes of non-face-to-face time during this encounter.      10/03/2020 4:13 PM Kristen Ryan  MRN:  [id number]  Chief Complaint:  " I am okay."  HPI: Patient started somewhat in distress during the phone session.  She stated that she is kind of been down this past month because this month is the death anniversary month of her father.  She stated that she was very close to him that she lost and in the month of March.  Her birthday was also last week. She stated that she is still impressed with the fact that hydroxyzine has helped her immensely with her anxiety.  She stated that she is hoping that now that March is over and the spring is here she will feel better over the next few weeks.  She stated that she had called the clinic earlier because she thought that she had an appointment with her therapist Ms. Lauren today.  Writer informed her that as per EMR she had an appointment with her on February 21 however it seems like she did not attend that session.  Patient requested for an appointment to be rescheduled with a therapist.  Visit Diagnosis:    ICD-10-CM   1. Bipolar 1 disorder, depressed, moderate (HCC)  F31.32 ziprasidone (GEODON) 80 MG capsule    vortioxetine HBr (TRINTELLIX) 20 MG TABS tablet    hydrOXYzine (ATARAX/VISTARIL) 25 MG tablet  2. PTSD (post-traumatic stress  disorder)  F43.10 vortioxetine HBr (TRINTELLIX) 20 MG TABS tablet    Past Psychiatric History: Bipolar 1 disorder, PTSD  Past Medical History:  Past Medical History:  Diagnosis Date  . Anemia   . Asthma   . Bipolar 1 disorder (HCC)   . Bronchitis   . COPD (chronic obstructive pulmonary disease) (HCC)   . Diverticulitis   . IBS (irritable bowel syndrome)   . Migraine   . Neuropathy   . Schizophrenia (HCC)   . Thyroid disease     Past Surgical History:  Procedure Laterality Date  . ABDOMINAL HYSTERECTOMY    . broke left arm    . CESAREAN SECTION    . COLONOSCOPY N/A 08/30/2014   Procedure: COLONOSCOPY;  Surgeon: Malissa Hippo, MD;  Location: [facility];  Service: Endoscopy;  Laterality: N/A;  1200  . ELBOW SURGERY    . FOOT SURGERY    . KNEE SURGERY    . TONSILLECTOMY      Family Psychiatric History: see below  Family History:  Family History  Problem Relation Age of Onset  . Dementia Mother   . Bipolar disorder Mother   . Dementia Father   . Other Son        MVA accident    Social History:  Social History   Socioeconomic History  . Marital status: Married    Spouse name: Not on file  . Number of children: Not on  file  . Years of education: Not on file  . Highest education level: Not on file  Occupational History  . Not on file  Tobacco Use  . Smoking status: Former Smoker    Packs/day: 4.00    Types: Cigarettes    Quit date: 07/06/2008    Years since quitting: 12.2  . Smokeless tobacco: Never Used  Vaping Use  . Vaping Use: Never used  Substance and Sexual Activity  . Alcohol use: No  . Drug use: No  . Sexual activity: Yes    Birth control/protection: Surgical    Comment: hyst  Other Topics Concern  . Not on file  Social History Narrative  . Not on file   Social Determinants of Health   Financial Resource Strain: Not on file  Food Insecurity: Not on file  Transportation Needs: Not on file  Physical Activity: Not on file  Stress: Not  on file  Social Connections: Not on file    Allergies:  Allergies  Allergen Reactions  . Aminophylline Anaphylaxis  . Sumatriptan Anaphylaxis and Other (See Comments)    Causes fainting  . Theophylline Anaphylaxis  . Codeine Itching and Other (See Comments)    Too strong for patient   . Depakote [Divalproex Sodium] Other (See Comments)    Hair loss  . Divalproex Sodium Other (See Comments)    Causes hair to fall out  . Doxycycline Other (See Comments)    headache  . Lamictal [Lamotrigine]     Destroys thyroid  . Lithium Other (See Comments)    Adverse reaction to Thyroid   . Magnesium-Containing Compounds Hives  . Metronidazole   . Nabumetone Swelling  . Naproxen Swelling  . Other     Hair dye  . Penicillins Nausea And Vomiting    Has patient had a PCN reaction causing immediate rash, facial/tongue/throat swelling, SOB or lightheadedness with hypotension: Yes Has patient had a PCN reaction causing severe rash involving mucus membranes or skin necrosis: No Has patient had a PCN reaction that required hospitalization Yes Has patient had a PCN reaction occurring within the last 10 years: No If all of the above answers are "NO", then may proceed with Cephalosporin use.   Marland Kitchen Pentazocine Lactate Other (See Comments)    Unknown reaction  . Risperidone Other (See Comments)    Insomnia   . Septra Ds [Sulfamethoxazole-Trimethoprim]   . Seroquel [Quetiapine] Swelling  . Tegretol [Carbamazepine] Swelling  . Clonidine Derivatives     Dizziness at 0.1 mg  . Tetracycline Rash    Headaches  . Zyprexa [Olanzapine] Rash and Other (See Comments)    Insomnia    Metabolic Disorder Labs: No results found for: HGBA1C, MPG No results found for: PROLACTIN No results found for: CHOL, TRIG, HDL, CHOLHDL, VLDL, LDLCALC Lab Results  Component Value Date   TSH 0.888 07/07/2010    Therapeutic Level Labs: Lab Results  Component Value Date   LITHIUM 1.27 10/25/2013   No results found  for: VALPROATE No components found for:  CBMZ  Current Medications: Current Outpatient Medications  Medication Sig Dispense Refill  . albuterol (PROVENTIL HFA;VENTOLIN HFA) 108 (90 Base) MCG/ACT inhaler Inhale 1-2 puffs into the lungs every 6 (six) hours as needed for wheezing or shortness of breath. 1 Inhaler 0  . aspirin 325 MG tablet Take 325 mg by mouth daily.    . budesonide (PULMICORT) 1 MG/2ML nebulizer solution Inhale into the lungs.    . Calcium Carbonate (CALCIUM 600 PO) Take 1  tablet by mouth 2 (two) times daily.    . Cholecalciferol (VITAMIN D-1000 MAX ST) 25 MCG (1000 UT) tablet Take by mouth.    . diclofenac sodium (VOLTAREN) 1 % GEL SMARTSIG:2-4 Gram(s) Topical Daily    . dicyclomine (BENTYL) 10 MG capsule Take 1 capsule by mouth 3 (three) times daily.    . Dupilumab (DUPIXENT) 300 MG/2ML SOPN Inject into the skin. (Patient not taking: Reported on 09/03/2020)    . EPINEPHrine 0.3 mg/0.3 mL IJ SOAJ injection INJECT (0.3)ML INTO THEUMUSCLE ONCE AS NEEDED FORNANAPHYLAXIS FOR ONE DOSE.    . ferrous sulfate 220 (44 Fe) MG/5ML solution Take 220 mg by mouth 2 (two) times a day.    . fexofenadine (ALLEGRA) 180 MG tablet Take 180 mg by mouth daily. (Patient not taking: Reported on 09/03/2020)    . gabapentin (NEURONTIN) 300 MG capsule Take 600 mg by mouth in the morning and at bedtime.    . gabapentin (NEURONTIN) 300 MG capsule Take 300 mg by mouth in the morning and at bedtime. 300 mg in the a.m. and 600 mg qhs    . gabapentin (NEURONTIN) 600 MG tablet TAKE (1) TABLET BY MOUTH TWICE DAILY. 180 tablet 0  . hydrochlorothiazide (HYDRODIURIL) 25 MG tablet Take 1 tablet by mouth daily.    . hydrOXYzine (ATARAX/VISTARIL) 25 MG tablet Take 1 tablet (25 mg total) by mouth 3 (three) times daily as needed for anxiety. 270 tablet 0  . ketoconazole (NIZORAL) 2 % cream MIX WITH FLUTICASONE CREAM AND APPLY TO AFFECTED AREA UP TO TWICE DAILY AS NEEDED. (Patient not taking: Reported on 09/03/2020) 30 g 0  .  levETIRAcetam (KEPPRA) 750 MG tablet Take 1 tablet by mouth daily. (Patient not taking: Reported on 09/03/2020)    . levothyroxine (SYNTHROID) 50 MCG tablet Take 1 tablet by mouth daily.    Marland Kitchen loratadine (CLARITIN) 10 MG tablet Take 10 mg by mouth daily. (Patient not taking: Reported on 09/03/2020)    . Magnesium 250 MG TABS Take by mouth.    . meclizine (ANTIVERT) 25 MG tablet Take 25 mg by mouth 2 (two) times daily as needed.    . metoprolol tartrate (LOPRESSOR) 25 MG tablet Take 0.5 tablets by mouth 2 (two) times a day.    . metroNIDAZOLE (FLAGYL) 500 MG tablet Take 1 tablet (500 mg total) by mouth 3 (three) times daily. 21 tablet 0  . Multiple Vitamin (MULTIVITAMIN WITH MINERALS) TABS tablet Take 1 tablet by mouth daily.    . naproxen (NAPROSYN) 500 MG tablet Take 500 mg by mouth 2 (two) times daily with a meal. (Patient not taking: Reported on 09/03/2020)    . ondansetron (ZOFRAN) 4 MG tablet Take 4 mg by mouth as needed.    Marland Kitchen oxyCODONE-acetaminophen (PERCOCET/ROXICET) 5-325 MG tablet Take 1-2 tablets by mouth every 6 (six) hours as needed for severe pain. 7 tablet 0  . pantoprazole (PROTONIX) 40 MG tablet Take 1 tablet by mouth daily. (Patient not taking: Reported on 09/03/2020)    . Red Yeast Rice 600 MG CAPS Take 1 capsule by mouth 2 (two) times daily. (Patient not taking: Reported on 09/03/2020)    . rosuvastatin (CRESTOR) 20 MG tablet Take 20 mg by mouth at bedtime.    . Selenium 200 MCG TABS Take by mouth daily. (Patient not taking: Reported on 09/03/2020)    . simvastatin (ZOCOR) 40 MG tablet Take by mouth. (Patient not taking: Reported on 09/03/2020)    . sodium chloride (OCEAN) 0.65 % SOLN  nasal spray Place 1 spray into both nostrils as needed for congestion.    . TRELEGY ELLIPTA 100-62.5-25 MCG/INH AEPB     . vitamin E 180 MG (400 UNITS) capsule Take 200 Units by mouth daily.    Marland Kitchen. vortioxetine HBr (TRINTELLIX) 20 MG TABS tablet Take 1 tablet (20 mg total) by mouth daily. 90 tablet 0  .  ziprasidone (GEODON) 80 MG capsule Take 1 capsule (80 mg total) by mouth 2 (two) times daily with a meal. 180 capsule 0   No current facility-administered medications for this visit.    Psychiatric Specialty Exam: Review of Systems  There were no vitals taken for this visit.There is no height or weight on file to calculate BMI.  General Appearance: unable to assess due to phone visit  Eye Contact:  unable to assess due to phone visit  Speech:  Clear and Coherent and Normal Rate  Volume:  Normal  Mood:  Slightly depressed  Affect:  Congruent  Thought Process:  Goal Directed, Linear and Descriptions of Associations: Intact  Orientation:  Full (Time, Place, and Person)  Thought Content: Logical   Suicidal Thoughts:  No  Homicidal Thoughts:  No  Memory:  Recent;   Good Remote;   Good  Judgement:  Good  Insight:  Good  Psychomotor Activity:  Normal  Concentration:  Concentration: Good and Attention Span: Good  Recall:  Good  Fund of Knowledge: Good  Language: Good  Akathisia:  Negative  Handed:  Right  AIMS (if indicated): not done  Assets:  Communication Skills Desire for Improvement Financial Resources/Insurance Housing  ADL's:  Intact  Cognition: WNL  Sleep:  Fair     Screenings: PHQ2-9   Flowsheet Row [facility] 06/15/2016 in [facility]  PHQ-2 Total Score 6 6  PHQ-9 Total Score 14 14       Assessment and Plan: Patient reported that she has been feeling a little depressed because March is the death anniversary of her father.  She is hoping that now that spring is here and March is over she will feel better over the next few weeks.  She denied any other concerns.   1. Bipolar 1 disorder, depressed, moderate (HCC)  - hydrOXYzine (ATARAX/VISTARIL) 25 MG tablet; Take 1 tablet (25 mg total) by mouth 3 (three) times daily as needed for anxiety.  Dispense: 90 tablet; Refill: 1 - ziprasidone (GEODON) 80 MG  capsule; Take 1 capsule (80 mg total) by mouth 2 (two) times daily with a meal.  Dispense: 60 capsule; Refill: 1 - vortioxetine HBr (TRINTELLIX) 20 MG TABS tablet; Take 1 tablet (20 mg total) by mouth daily.  Dispense: 90 tablet; Refill: 0  2. PTSD (post-traumatic stress disorder)  - vortioxetine HBr (TRINTELLIX) 20 MG TABS tablet; Take 1 tablet (20 mg total) by mouth daily.  Dispense: 90 tablet; Refill: 0   Writer reached out to the front desk office of [facility] to reschedule her appointment with Ms. Lauren for therapy session. Continue same medications. F/up in 10 weeks.  Zena AmosMandeep Efosa Treichler, MD 10/03/2020 4:13 PM

## 2020-10-14 ENCOUNTER — Ambulatory Visit (INDEPENDENT_AMBULATORY_CARE_PROVIDER_SITE_OTHER): Payer: Medicare Other | Admitting: Licensed Clinical Social Worker

## 2020-10-14 ENCOUNTER — Encounter: Payer: Self-pay | Admitting: Licensed Clinical Social Worker

## 2020-10-14 ENCOUNTER — Other Ambulatory Visit: Payer: Self-pay

## 2020-10-14 DIAGNOSIS — F4310 Post-traumatic stress disorder, unspecified: Secondary | ICD-10-CM | POA: Diagnosis not present

## 2020-10-14 DIAGNOSIS — F314 Bipolar disorder, current episode depressed, severe, without psychotic features: Secondary | ICD-10-CM
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
## 2020-11-28 ENCOUNTER — Other Ambulatory Visit (HOSPITAL_COMMUNITY): Payer: Self-pay | Admitting: Psychiatry

## 2020-11-28 ENCOUNTER — Telehealth (HOSPITAL_COMMUNITY): Payer: Self-pay | Admitting: *Deleted

## 2020-11-28 DIAGNOSIS — F3132 Bipolar disorder, current episode depressed, moderate: Secondary | ICD-10-CM

## 2020-11-28 MED ORDER — GABAPENTIN 600 MG PO TABS: ORAL_TABLET | ORAL | 0 refills | Status: DC

## 2020-11-28 MED ORDER — HYDROXYZINE HCL 25 MG PO TABS: 25.0000 mg | ORAL_TABLET | Freq: Three times a day (TID) | ORAL | 0 refills | Status: DC | PRN

## 2020-11-28 NOTE — Telephone Encounter (Signed)
Kristen Ryan made aware her medicines have been called in and reminded of her next appt on 6/16 with Dr Evelene Croon.

## 2020-11-28 NOTE — Telephone Encounter (Signed)
Call from patient asking where her chart is because she knows she is moving her services because she needs refills. She is still a patient here, she has an appt with Dr Evelene Croon on 6/16 and well ask Dr Doyne Keel for refills as Dr Evelene Croon is out of the office. She should be out of her Gabapentin and her hydroxyzine.

## 2020-11-28 NOTE — Telephone Encounter (Signed)
Provider refilled medications.  No other concerns at this time.

## 2020-12-19 ENCOUNTER — Encounter (HOSPITAL_COMMUNITY): Payer: Self-pay | Admitting: Psychiatry

## 2020-12-19 ENCOUNTER — Telehealth (INDEPENDENT_AMBULATORY_CARE_PROVIDER_SITE_OTHER): Payer: Medicare Other | Admitting: Psychiatry

## 2020-12-19 ENCOUNTER — Emergency Department (HOSPITAL_COMMUNITY)
Admission: EM | Admit: 2020-12-19 | Discharge: 2020-12-19 | Disposition: A | Discharge status: Home / Self Care | Payer: Medicare Other | Attending: Emergency Medicine | Admitting: Emergency Medicine

## 2020-12-19 ENCOUNTER — Encounter (HOSPITAL_COMMUNITY): Payer: Self-pay

## 2020-12-19 ENCOUNTER — Emergency Department (HOSPITAL_COMMUNITY): Payer: Medicare Other

## 2020-12-19 ENCOUNTER — Other Ambulatory Visit: Payer: Self-pay

## 2020-12-19 DIAGNOSIS — M25511 Pain in right shoulder: Secondary | ICD-10-CM | POA: Diagnosis not present

## 2020-12-19 DIAGNOSIS — S161XXA Strain of muscle, fascia and tendon at neck level, initial encounter: Secondary | ICD-10-CM | POA: Diagnosis not present

## 2020-12-19 DIAGNOSIS — Z7982 Long term (current) use of aspirin: Secondary | ICD-10-CM | POA: Diagnosis not present

## 2020-12-19 DIAGNOSIS — Z87891 Personal history of nicotine dependence: Secondary | ICD-10-CM | POA: Insufficient documentation

## 2020-12-19 DIAGNOSIS — F4310 Post-traumatic stress disorder, unspecified: Secondary | ICD-10-CM | POA: Diagnosis not present

## 2020-12-19 DIAGNOSIS — F3175 Bipolar disorder, in partial remission, most recent episode depressed: Secondary | ICD-10-CM | POA: Diagnosis not present

## 2020-12-19 DIAGNOSIS — Y92410 Unspecified street and highway as the place of occurrence of the external cause: Secondary | ICD-10-CM | POA: Insufficient documentation

## 2020-12-19 DIAGNOSIS — J45909 Unspecified asthma, uncomplicated: Secondary | ICD-10-CM | POA: Insufficient documentation

## 2020-12-19 DIAGNOSIS — J449 Chronic obstructive pulmonary disease, unspecified: Secondary | ICD-10-CM | POA: Insufficient documentation

## 2020-12-19 DIAGNOSIS — M25512 Pain in left shoulder: Secondary | ICD-10-CM | POA: Diagnosis not present

## 2020-12-19 DIAGNOSIS — S199XXA Unspecified injury of neck, initial encounter: Secondary | ICD-10-CM | POA: Diagnosis present

## 2020-12-19 MED ORDER — METHOCARBAMOL 500 MG PO TABS
500.0000 mg | ORAL_TABLET | Freq: Once | ORAL | Status: AC
  Administered 2020-12-19: 11:00:00 500 mg via ORAL
  Filled 2020-12-19: qty 1

## 2020-12-19 MED ORDER — HYDROXYZINE HCL 25 MG PO TABS
25.0000 mg | ORAL_TABLET | Freq: Three times a day (TID) | ORAL | 0 refills | Status: DC | PRN
Start: 2020-12-19 — End: 2021-07-08

## 2020-12-19 MED ORDER — ZIPRASIDONE HCL 80 MG PO CAPS
80.0000 mg | ORAL_CAPSULE | Freq: Two times a day (BID) | ORAL | 0 refills | Status: DC
Start: 2020-12-19 — End: 2022-06-08

## 2020-12-19 MED ORDER — METHOCARBAMOL 500 MG PO TABS: 500.0000 mg | ORAL_TABLET | Freq: Three times a day (TID) | ORAL | 0 refills | Status: DC

## 2020-12-19 MED ORDER — GABAPENTIN 600 MG PO TABS: ORAL_TABLET | ORAL | 0 refills | Status: DC

## 2020-12-19 MED ORDER — VORTIOXETINE HBR 20 MG PO TABS
20.0000 mg | ORAL_TABLET | Freq: Every day | ORAL | 0 refills | Status: DC
Start: 2020-12-19 — End: 2022-06-08

## 2020-12-19 NOTE — Discharge Instructions (Addendum)
Alternate ice and heat to your neck and shoulders.  You may take the muscle relaxer as directed.  Be aware this may cause drowsiness avoid operating machinery or driving while taking this medication.  Follow-up with your primary care provider next week for recheck.

## 2020-12-19 NOTE — ED Notes (Signed)
MSE verbally taken, signature pad not working.
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
## 2020-12-19 NOTE — ED Notes (Signed)
Cervical collar applied to patient
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
## 2020-12-26 ENCOUNTER — Other Ambulatory Visit: Payer: Self-pay

## 2020-12-26 ENCOUNTER — Ambulatory Visit (INDEPENDENT_AMBULATORY_CARE_PROVIDER_SITE_OTHER): Payer: Medicare Other | Admitting: Licensed Clinical Social Worker

## 2020-12-26 DIAGNOSIS — Z5329 Procedure and treatment not carried out because of patient's decision for other reasons: Secondary | ICD-10-CM

## 2020-12-26 NOTE — Progress Notes (Signed)
LCSW counselor tried to connect with patient for scheduled appointment via MyChart video text request x 2 and email request; also tried to connect via phone without success. LCSW counselor left message for patient to call office number to reschedule OPT appointment.  

## 2021-01-13 ENCOUNTER — Ambulatory Visit: Payer: Medicare Other | Admitting: Psychiatry

## 2021-01-28 DIAGNOSIS — I10 Essential (primary) hypertension: Secondary | ICD-10-CM | POA: Insufficient documentation

## 2021-02-12 ENCOUNTER — Ambulatory Visit: Payer: Medicare Other | Admitting: Psychiatry

## 2021-02-17 ENCOUNTER — Other Ambulatory Visit: Payer: Self-pay

## 2021-02-17 ENCOUNTER — Encounter (HOSPITAL_COMMUNITY)
Admission: RE | Admit: 2021-02-17 | Discharge: 2021-02-17 | Disposition: A | Discharge status: Home / Self Care | Payer: Medicare Other | Source: Ambulatory Visit | Attending: Pulmonary Disease | Admitting: Pulmonary Disease

## 2021-02-17 VITALS — BP 102/60 | HR 65 | Ht 66.0 in | Wt 145.9 lb

## 2021-02-17 DIAGNOSIS — J45909 Unspecified asthma, uncomplicated: Secondary | ICD-10-CM | POA: Insufficient documentation

## 2021-02-17 NOTE — Progress Notes (Signed)
Pulmonary Individual Treatment Plan  Patient Details  Name: SAMORA JERNBERG MRN: [id number] Date of Birth: [birth year] Referring Provider:   Flowsheet Row PULMONARY REHAB OTHER RESP ORIENTATION from 02/17/2021 in [facility] CARDIAC REHABILITATION  Referring Provider Dr. Karna Christmas       Initial Encounter Date:  Flowsheet Row PULMONARY REHAB OTHER RESP ORIENTATION from 02/17/2021 in [facility] CARDIAC REH[facility]IL[facility]ATION  Date 02/17/21       Visit Diagnosis: Uncomplicated asthma, unspecified asthma severity, unspecified whether persistent  Patient's Home Medications on Admission:   Current Outpatient Medications:    calcium-vitamin D (OSCAL WITH D) 500-200 MG-UNIT tablet, Take 1 tablet by mouth 2 (two) times daily., Disp: , Rfl:    Dupilumab (DUPIXENT) 300 MG/2ML SOPN, Inject 300 mg into the skin every 14 (fourteen) days., Disp: , Rfl:    omeprazole (PRILOSEC) 40 MG capsule, Take 40 mg by mouth 2 (two) times daily before a meal., Disp: , Rfl:    albuterol (PROVENTIL HFA;VENTOLIN HFA) 108 (90 Base) MCG/ACT inhaler, Inhale 1-2 puffs into the lungs every 6 (six) hours as needed for wheezing or shortness of breath., Disp: 1 Inhaler, Rfl: 0   aspirin 325 MG tablet, Take 325 mg by mouth daily., Disp: , Rfl:    budesonide (PULMICORT) 1 MG/2ML nebulizer solution, Inhale into the lungs., Disp: , Rfl:    diclofenac sodium (VOLTAREN) 1 % GEL, SMARTSIG:2-4 Gram(s) Topical Daily, Disp: , Rfl:    dicyclomine (BENTYL) 10 MG capsule, Take 1 capsule by mouth 3 (three) times daily., Disp: , Rfl:    EPINEPHrine 0.3 mg/0.3 mL IJ SOAJ injection, INJECT (0.3)ML INTO THEUMUSCLE ONCE AS NEEDED FORNANAPHYLAXIS FOR ONE DOSE., Disp: , Rfl:    ferrous sulfate 220 (44 Fe) MG/5ML solution, Take 220 mg by mouth 2 (two) times a day., Disp: , Rfl:    ferrous sulfate 325 (65 FE) MG tablet, Take 325 mg by mouth 2 (two) times daily before a meal., Disp: , Rfl:    gabapentin (NEURONTIN) 300 MG capsule, Take 300 mg by mouth 2  (two) times daily., Disp: , Rfl:    gabapentin (NEURONTIN) 600 MG tablet, TAKE (1) TABLET BY MOUTH TWICE DAILY., Disp: 180 tablet, Rfl: 0   hydrochlorothiazide (HYDRODIURIL) 25 MG tablet, Take 1 tablet by mouth daily., Disp: , Rfl:    hydrOXYzine (ATARAX/VISTARIL) 25 MG tablet, Take 1 tablet (25 mg total) by mouth 3 (three) times daily as needed for anxiety., Disp: 270 tablet, Rfl: 0   levothyroxine (SYNTHROID) 50 MCG tablet, Take 1 tablet by mouth daily., Disp: , Rfl:    loratadine (CLARITIN) 10 MG tablet, Take 10 mg by mouth daily. (Patient not taking: Reported on 09/03/2020), Disp: , Rfl:    Magnesium 250 MG TABS, Take by mouth., Disp: , Rfl:    meclizine (ANTIVERT) 25 MG tablet, Take 25 mg by mouth 2 (two) times daily as needed., Disp: , Rfl:    meloxicam (MOBIC) 15 MG tablet, Take 15 mg by mouth at bedtime., Disp: , Rfl:    methocarbamol (ROBAXIN) 500 MG tablet, Take 1 tablet (500 mg total) by mouth 3 (three) times daily. (Patient not taking: Reported on 02/17/2021), Disp: 21 tablet, Rfl: 0   metoprolol tartrate (LOPRESSOR) 25 MG tablet, Take 25 mg by mouth daily., Disp: , Rfl:    metroNIDAZOLE (FLAGYL) 500 MG tablet, Take 1 tablet (500 mg total) by mouth 3 (three) times daily., Disp: 21 tablet, Rfl: 0   montelukast (SINGULAIR) 10 MG tablet, Take 10 mg by mouth at  bedtime., Disp: , Rfl:    Multiple Vitamin (MULTIVITAMIN WITH MINERALS) TABS tablet, Take 1 tablet by mouth daily., Disp: , Rfl:    ondansetron (ZOFRAN) 4 MG tablet, Take 4 mg by mouth as needed., Disp: , Rfl:    oxyCODONE-acetaminophen (PERCOCET) 7.5-325 MG tablet, Take 7.5 tablets by mouth every 6 (six) hours as needed., Disp: , Rfl:    oxyCODONE-acetaminophen (PERCOCET/ROXICET) 5-325 MG tablet, Take 1-2 tablets by mouth every 6 (six) hours as needed for severe pain. (Patient not taking: Reported on 02/17/2021), Disp: 7 tablet, Rfl: 0   rosuvastatin (CRESTOR) 20 MG tablet, Take 20 mg by mouth at bedtime. (Patient not taking: Reported on  02/17/2021), Disp: , Rfl:    sodium chloride (OCEAN) 0.65 % SOLN nasal spray, Place 1 spray into both nostrils as needed for congestion., Disp: , Rfl:    TRELEGY ELLIPTA 100-62.5-25 MCG/INH AEPB, , Disp: , Rfl:    triamcinolone ointment (KENALOG) 0.1 %, Apply 1 application topically daily as needed., Disp: , Rfl:    vitamin E 180 MG (400 UNITS) capsule, Take 400 Units by mouth at bedtime., Disp: , Rfl:    vortioxetine HBr (TRINTELLIX) 20 MG TABS tablet, Take 1 tablet (20 mg total) by mouth daily., Disp: 90 tablet, Rfl: 0   ziprasidone (GEODON) 80 MG capsule, Take 1 capsule (80 mg total) by mouth 2 (two) times daily with a meal., Disp: 180 capsule, Rfl: 0  Past Medical History: Past Medical History:  Diagnosis Date   Anemia    Asthma    Bipolar 1 disorder (HCC)    Bronchitis    COPD (chronic obstructive pulmonary disease) (HCC)    Diverticulitis    IBS (irritable bowel syndrome)    Migraine    Neuropathy    Schizophrenia (HCC)    Thyroid disease     Tobacco Use: Social History   Tobacco Use  Smoking Status Former   Packs/day: 4.00   Types: Cigarettes   Quit date: 07/06/2008   Years since quitting: 12.6  Smokeless Tobacco Never    Labs: Recent Review Advice worker     Labs for ITP Cardiac and Pulmonary Rehab Latest Ref Rng & Units 07/07/2010   PHART 7.350 - 7.400 7.355   PCO2ART 35.0 - 45.0 mmHg 45.0   HCO3 20.0 - 24.0 mEq/L 24.5(H)   ACIDBASEDEF 0.0 - 2.0 mmol/L 0.3   O2SAT % 98.3       Capillary Blood Glucose: Lab Results  Component Value Date   GLUCAP 101 (H) 08/06/2015   GLUCAP 109 (H) 08/05/2015   GLUCAP 88 08/04/2015     Pulmonary Assessment Scores:  Pulmonary Assessment Scores     Row Name 02/17/21 1254         ADL UCSD   ADL Phase Entry     SOB Score total 51           CAT Score   CAT Score 15           mMRC Score   mMRC Score 3             UCSD: Self-administered rating of dyspnea associated with activities of daily living  (ADLs) 6-point scale (0 = "not at all" to 5 = "maximal or unable to do because of breathlessness")  Scoring Scores range from 0 to 120.  Minimally important difference is 5 units  CAT: CAT can identify the health impairment of COPD patients and is better correlated with disease progression.  CAT has a scoring  range of zero to 40. The CAT score is classified into four groups of low (less than 10), medium (10 - 20), high (21-30) and very high (31-40) based on the impact level of disease on health status. A CAT score over 10 suggests significant symptoms.  A worsening CAT score could be explained by an exacerbation, poor medication adherence, poor inhaler technique, or progression of COPD or comorbid conditions.  CAT MCID is 2 points  mMRC: mMRC (Modified Medical Research Council) Dyspnea Scale is used to assess the degree of baseline functional disability in patients of respiratory disease due to dyspnea. No minimal important difference is established. A decrease in score of 1 point or greater is considered a positive change.   Pulmonary Function Assessment:   Exercise Target Goals: Exercise Program Goal: Individual exercise prescription set using results from initial 6 min walk test and THRR while considering  patient's activity barriers and safety.   Exercise Prescription Goal: Initial exercise prescription builds to 30-45 minutes a day of aerobic activity, 2-3 days per week.  Home exercise guidelines will be given to patient during program as part of exercise prescription that the participant will acknowledge.  Activity Barriers & Risk Stratification:  Activity Barriers & Cardiac Risk Stratification - 02/17/21 1254       Activity Barriers & Cardiac Risk Stratification   Activity Barriers Arthritis;Back Problems;Neck/Spine Problems;Joint Problems;Deconditioning;Shortness of Breath;History of Falls;Balance Concerns    Cardiac Risk Stratification Moderate             6 Minute  Walk:  6 Minute Walk     Row Name 02/17/21 1553         6 Minute Walk   Phase Initial     Distance 800 feet     Walk Time 6 minutes     # of Rest Breaks 1     MPH 1.52     METS 2.52     RPE 12     Perceived Dyspnea  11     VO2 Peak 8.81     Symptoms Yes (comment)     Comments 1 minute standing break due to desaturation and to get O2 placed on via nasual cannula. Back pain 8/10     Resting HR 65 bpm     Resting BP 102/60     Resting Oxygen Saturation  91 %     Exercise Oxygen Saturation  during 6 min walk 81 %     Max Ex. HR 99 bpm     Max Ex. BP 134/70     2 Minute Post BP 110/62           Interval HR   1 Minute HR 75     2 Minute HR 79     3 Minute HR 81     4 Minute HR 89     5 Minute HR 91     6 Minute HR 99     2 Minute Post HR 79     Interval Heart Rate? Yes           Interval Oxygen   Interval Oxygen? Yes     Baseline Oxygen Saturation % 91 %     1 Minute Oxygen Saturation % 88 %     1 Minute Liters of Oxygen 0 L     2 Minute Oxygen Saturation % 81 %     2 Minute Liters of Oxygen 0 L     3 Minute Oxygen Saturation % 95 %  3 Minute Liters of Oxygen 2 L     4 Minute Oxygen Saturation % 98 %     4 Minute Liters of Oxygen 2 L     5 Minute Oxygen Saturation % 100 %     5 Minute Liters of Oxygen 2 L     6 Minute Oxygen Saturation % 96 %     6 Minute Liters of Oxygen 2 L     2 Minute Post Oxygen Saturation % 99 %     2 Minute Post Liters of Oxygen 2 L              Oxygen Initial Assessment:  Oxygen Initial Assessment - 02/17/21 1558       Initial 6 min Walk   Oxygen Used Continuous    Liters per minute 2      Program Oxygen Prescription   Program Oxygen Prescription Continuous    Liters per minute 2      Intervention   Short Term Goals To learn and exhibit compliance with exercise, home and travel O2 prescription;To learn and understand importance of monitoring SPO2 with pulse oximeter and demonstrate accurate use of the pulse oximeter.;To  learn and understand importance of maintaining oxygen saturations>88%;To learn and demonstrate proper pursed lip breathing techniques or other breathing techniques. ;To learn and demonstrate proper use of respiratory medications    Long  Term Goals Exhibits compliance with exercise, home  and travel O2 prescription;Verbalizes importance of monitoring SPO2 with pulse oximeter and return demonstration;Maintenance of O2 saturations>88%;Exhibits proper breathing techniques, such as pursed lip breathing or other method taught during program session;Compliance with respiratory medication;Demonstrates proper use of MDI's             Oxygen Re-Evaluation:   Oxygen Discharge (Final Oxygen Re-Evaluation):   Initial Exercise Prescription:  Initial Exercise Prescription - 02/17/21 1500       Date of Initial Exercise RX and Referring Provider   Date 02/17/21    Referring Provider Dr. Karna Christmas    Expected Discharge Date 06/24/21      Oxygen   Oxygen Continuous    Liters 2      Treadmill   MPH 1    Grade 0    Minutes 17      NuStep   Level 1    SPM 60    Minutes 22      Prescription Details   Frequency (times per week) 2    Duration Progress to 30 minutes of continuous aerobic without signs/symptoms of physical distress      Intensity   THRR 40-80% of Max Heartrate 62-124    Ratings of Perceived Exertion 11-13    Perceived Dyspnea 0-4      Resistance Training   Training Prescription Yes    Weight 2 lbs    Reps 10-15             Perform Capillary Blood Glucose checks as needed.  Exercise Prescription Changes:   Exercise Comments:   Exercise Goals and Review:   Exercise Goals     Row Name 02/17/21 1557             Exercise Goals   Increase Physical Activity Yes       Intervention Provide advice, education, support and counseling about physical activity/exercise needs.;Develop an individualized exercise prescription for aerobic and resistive training based  on initial evaluation findings, risk stratification, comorbidities and participant's personal goals.       Expected Outcomes Short  Term: Attend rehab on a regular basis to increase amount of physical activity.;Long Term: Add in home exercise to make exercise part of routine and to increase amount of physical activity.;Long Term: Exercising regularly at least 3-5 days a week.       Increase Strength and Stamina Yes       Intervention Provide advice, education, support and counseling about physical activity/exercise needs.;Develop an individualized exercise prescription for aerobic and resistive training based on initial evaluation findings, risk stratification, comorbidities and participant's personal goals.       Expected Outcomes Short Term: Increase workloads from initial exercise prescription for resistance, speed, and METs.;Short Term: Perform resistance training exercises routinely during rehab and add in resistance training at home;Long Term: Improve cardiorespiratory fitness, muscular endurance and strength as measured by increased METs and functional capacity ( )       Able to understand and use rate of perceived exertion (RPE) scale Yes       Intervention Provide education and explanation on how to use RPE scale       Expected Outcomes Short Term: Able to use RPE daily in rehab to express subjective intensity level;Long Term:  Able to use RPE to guide intensity level when exercising independently       Able to understand and use Dyspnea scale Yes       Intervention Provide education and explanation on how to use Dyspnea scale       Expected Outcomes Short Term: Able to use Dyspnea scale daily in rehab to express subjective sense of shortness of breath during exertion;Long Term: Able to use Dyspnea scale to guide intensity level when exercising independently       Knowledge and understanding of Target Heart Rate Range (THRR) Yes       Intervention Provide education and explanation of THRR  including how the numbers were predicted and where they are located for reference       Expected Outcomes Short Term: Able to state/look up THRR;Long Term: Able to use THRR to govern intensity when exercising independently;Short Term: Able to use daily as guideline for intensity in rehab       Understanding of Exercise Prescription Yes       Intervention Provide education, explanation, and written materials on patient's individual exercise prescription       Expected Outcomes Short Term: Able to explain program exercise prescription;Long Term: Able to explain home exercise prescription to exercise independently                Exercise Goals Re-Evaluation :   Discharge Exercise Prescription (Final Exercise Prescription Changes):   Nutrition:  Target Goals: Understanding of nutrition guidelines, daily intake of sodium 1500mg , cholesterol 200mg , calories 30% from fat and 7% or less from saturated fats, daily to have 5 or more servings of fruits and vegetables.  Biometrics:  Pre Biometrics - 02/17/21 1558       Pre Biometrics   Height 5\' 6"  (1.676 m)    Weight 145 lb 15.1 oz (66.2 kg)    Waist Circumference 36.5 inches    Hip Circumference 40.5 inches    Waist to Hip Ratio 0.9 %    BMI (Calculated) 23.57    Triceps Skinfold 21 mm    % Body Fat 35.5 %    Grip Strength 24.9 kg    Flexibility 0 in    Single Leg Stand 0 seconds              Nutrition Therapy Plan  and Nutrition Goals:   Nutrition Assessments:  Nutrition Assessments - 02/17/21 1259       MEDFICTS Scores   Pre Score 6            MEDIFICTS Score Key: ?70 Need to make dietary changes  40-70 Heart Healthy Diet ? 40 Therapeutic Level Cholesterol Diet   Picture Your Plate Scores: <48 Unhealthy dietary pattern with much room for improvement. 41-50 Dietary pattern unlikely to meet recommendations for good health and room for improvement. 51-60 More healthful dietary pattern, with some room for  improvement.  >60 Healthy dietary pattern, although there may be some specific behaviors that could be improved.    Nutrition Goals Re-Evaluation:   Nutrition Goals Discharge (Final Nutrition Goals Re-Evaluation):   Psychosocial: Target Goals: Acknowledge presence or absence of significant depression and/or stress, maximize coping skills, provide positive support system. Participant is able to verbalize types and ability to use techniques and skills needed for reducing stress and depression.  Initial Review & Psychosocial Screening:  Initial Psych Review & Screening - 02/17/21 1255       Initial Review   Current issues with Current Depression;Current Anxiety/Panic;Current Psychotropic Meds;Current Stress Concerns    Source of Stress Concerns Chronic Illness      Family Dynamics   Good Support System? Yes    Comments Her son, Reuel Boom, and her husband are her support system.      Barriers   Psychosocial barriers to participate in program The patient should benefit from training in stress management and relaxation.      Screening Interventions   Interventions Encouraged to exercise;Provide feedback about the scores to participant    Expected Outcomes Short Term goal: Utilizing psychosocial counselor, staff and physician to assist with identification of specific Stressors or current issues interfering with healing process. Setting desired goal for each stressor or current issue identified.;Long Term Goal: Stressors or current issues are controlled or eliminated.;Short Term goal: Identification and review with participant of any Quality of Life or Depression concerns found by scoring the questionnaire.;Long Term goal: The participant improves quality of Life and PHQ9 Scores as seen by post scores and/or verbalization of changes             Quality of Life Scores:  Quality of Life - 02/17/21 1259       Quality of Life   Select Quality of Life      Quality of Life Scores    Health/Function Pre 18.38 %    Socioeconomic Pre 22.92 %    Psych/Spiritual Pre 20.07 %    Family Pre 26.4 %    GLOBAL Pre 18.38 %            Scores of 19 and below usually indicate a poorer quality of life in these areas.  A difference of  2-3 points is a clinically meaningful difference.  A difference of 2-3 points in the total score of the Quality of Life Index has been associated with significant improvement in overall quality of life, self-image, physical symptoms, and general health in studies assessing change in quality of life.   PHQ-9: Recent Review Flowsheet Data     Depression screen [facility] 2/9 02/17/2021 07/28/2016 06/15/2016 06/15/2016   Decreased Interest 0 3 3 3    Down, Depressed, Hopeless 3 3 3 3    PHQ - 2 Score 3 6 6 6    Altered sleeping 1 0 1 -   Tired, decreased energy 3 1 3  -   Change in appetite  3 0 0 -   Feeling bad or failure about yourself  1 2 0 -   Trouble concentrating -   Moving slowly or fidgety/restless -   Suicidal thoughts 0 0 0 -   PHQ-9 Score -   Difficult doing work/chores Somewhat difficult - - -      Interpretation of Total Score  Total Score Depression Severity:  1-4 = Minimal depression, 5-9 = Mild depression, 10-14 = Moderate depression, 15-19 = Moderately severe depression, 20-27 = Severe depression   Psychosocial Evaluation and Intervention:  Psychosocial Evaluation - 02/17/21 1600       Psychosocial Evaluation & Interventions   Interventions Stress management education;Relaxation education;Encouraged to exercise with the program and follow exercise prescription    Comments Patient has no barriers to completing pulmonary rehab. She has several psychosocial issues that include depression, anxiety, bi polar disorder, and schizophrenia. She is prescribed geodon for her bi polar disorder and schizophrenia. She reports that this medication works well for her. She takes Trintellix for depression, but she feels like it is  not working. She sees a [employer] manager and a psychiatrist. She scored a 13 on her PHQ-9 and reports that it is so high because she feels like Trintellix is no longer working. She reports that she has a good support system with her son and her husband. Her goals while in the program are to be able to walk longer distances without becomining short of breath. She is eager to start the program.    Expected Outcomes The patient's psychosocial issues will be controlled.    Continue Psychosocial Services  No Follow up required             Psychosocial Re-Evaluation:   Psychosocial Discharge (Final Psychosocial Re-Evaluation):    Education: Education Goals: Education classes will be provided on a weekly basis, covering required topics. Participant will state understanding/return demonstration of topics presented.  Learning Barriers/Preferences:  Learning Barriers/Preferences - 02/17/21 1301       Learning Barriers/Preferences   Learning Barriers None    Learning Preferences Individual Instruction;Skilled Demonstration             Education Topics: How Lungs Work and Diseases: - Discuss the anatomy of the lungs and diseases that can affect the lungs, such as COPD.   Exercise: -Discuss the importance of exercise, FITT principles of exercise, normal and abnormal responses to exercise, and how to exercise safely.   Environmental Irritants: -Discuss types of environmental irritants and how to limit exposure to environmental irritants.   Meds/Inhalers and oxygen: - Discuss respiratory medications, definition of an inhaler and oxygen, and the proper way to use an inhaler and oxygen.   Energy Saving Techniques: - Discuss methods to conserve energy and decrease shortness of breath when performing activities of daily living.    Bronchial Hygiene / Breathing Techniques: - Discuss breathing mechanics, pursed-lip breathing technique,  proper posture, effective ways to clear airways, and  other functional breathing techniques   Cleaning Equipment: - Provides group verbal and written instruction about the health risks of elevated stress, cause of high stress, and healthy ways to reduce stress.   Nutrition I: Fats: - Discuss the types of cholesterol, what cholesterol does to the body, and how cholesterol levels can be controlled.   Nutrition II: Labels: -Discuss the different components of food labels and how to read food labels.   Respiratory Infections: - Discuss the signs and symptoms of respiratory  infections, ways to prevent respiratory infections, and the importance of seeking medical treatment when having a respiratory infection.   Stress I: Signs and Symptoms: - Discuss the causes of stress, how stress may lead to anxiety and depression, and ways to limit stress.   Stress II: Relaxation: -Discuss relaxation techniques to limit stress.   Oxygen for Home/Travel: - Discuss how to prepare for travel when on oxygen and proper ways to transport and store oxygen to ensure safety.   Knowledge Questionnaire Score:  Knowledge Questionnaire Score - 02/17/21 1301       Knowledge Questionnaire Score   Pre Score 14/18             Core Components/Risk Factors/Patient Goals at Admission:  Personal Goals and Risk Factors at Admission - 02/17/21 1306       Core Components/Risk Factors/Patient Goals on Admission   Admit Weight 152 lb (68.9 kg)    Goal Weight: Short Term 135 lb (61.2 kg)             Core Components/Risk Factors/Patient Goals Review:    Core Components/Risk Factors/Patient Goals at Discharge (Final Review):    ITP Comments:   Comments: Patient arrived for 1st visit/orientation/education at 1230. Patient was referred to PR by Dr. Karna ChristmasAleskerov due to Uncomplicated Asthma (J45.909). During orientation advised patient on arrival and appointment times what to wear, what to do before, during and after exercise. Reviewed attendance and class  policy.  Pt is scheduled to return Pulmonary Rehab on 02/20/2021 at 1330. Pt was advised to come to class 15 minutes before class starts.  Discussed RPE/Dpysnea scales. Patient participated in warm up stretches. Patient was able to complete 6 minute walk test. Patient was measured for the equipment. Discussed equipment safety with patient. Took patient pre-anthropometric measurements. Patient finished visit at 1415.

## 2021-02-17 NOTE — Progress Notes (Signed)
Cardiac/Pulmonary Rehab Medication Review by a Pharmacist  Does the patient  feel that his/her medications are working for him/her?  yes  Has the patient been experiencing any side effects to the medications prescribed?  no  Does the patient measure his/her own blood pressure or blood glucose at home?  yes   Does the patient have any problems obtaining medications due to transportation or finances?   no  Understanding of regimen: excellent Understanding of indications: excellent Potential of compliance: good  Questions asked to Determine Patient Understanding of Medication Regimen:  1. What is the name of the medication?  2. What is the medication used for?  3. When should it be taken?  4. How much should be taken?  5. How will you take it?  6. What side effects should you report?  Understanding Defined as: Excellent: All questions above are correct Good: Questions 1-4 are correct Fair: Questions 1-2 are correct  Poor: 1 or none of the above questions are correct   Pharmacist comments: Patient presents today for pulmonary rehab. We reviewed her extensive number of medications. Patient would like to take less medications and did review some that she could take prn. She also has several vitamins she takes which she prefers vitamins over prescription medications. Her trelegy ellipta has made a big difference for her. She monitors her BP and heart rate.  She is tolerating her current regimen.  Thanks for the opportunity to participate in the care of this patient,   Elder Cyphers, BS Loura Back, [place] Clinical Pharmacist Pager [phone] 02/17/2021 1:46 PM

## 2021-02-18 ENCOUNTER — Ambulatory Visit (INDEPENDENT_AMBULATORY_CARE_PROVIDER_SITE_OTHER): Payer: Medicare Other | Admitting: Licensed Clinical Social Worker

## 2021-02-18 DIAGNOSIS — Z634 Disappearance and death of family member: Secondary | ICD-10-CM | POA: Diagnosis not present

## 2021-02-18 DIAGNOSIS — F3175 Bipolar disorder, in partial remission, most recent episode depressed: Secondary | ICD-10-CM

## 2021-02-18 DIAGNOSIS — F4310 Post-traumatic stress disorder, unspecified: Secondary | ICD-10-CM

## 2021-02-18 NOTE — Progress Notes (Signed)
Virtual Visit via Video Note  I connected with Kristen Ryan on 02/18/21 at 10:00 AM EDT by a video enabled telemedicine application and verified that I am speaking with the correct person using two identifiers.  Location: Patient: home Provider: remote office [place], [place])   I discussed the limitations of evaluation and management by telemedicine and the availability of in person appointments. The patient expressed understanding and agreed to proceed.   I discussed the assessment and treatment plan with the patient. The patient was provided an opportunity to ask questions and all were answered. The patient agreed with the plan and demonstrated an understanding of the instructions.   The patient was advised to call back or seek an in-person evaluation if the symptoms worsen or if the condition fails to improve as anticipated.  I provided 60 minutes of non-face-to-face time during this encounter.   Marquelle Musgrave R Brandyce Dimario, LCSW   THERAPIST PROGRESS NOTE  Session Time: 10-11a  Participation Level: Active  Behavioral Response: Neat and Well GroomedAlertDepressed  Type of Therapy: Individual Therapy  Treatment Goals addressed: Anxiety and Coping  Interventions: CBT  Summary: Kristen Ryan is a 65 y.o. female who presents with continuing symptoms related to bipolar disorder and PTSD diagnosis. Patient reports that she is continuing to feel grief associated with the loss of her father. Patient believes that her father was murdered, and is having a hard time finding closure because there is no current search for the person that murdered her father. Allow patient safe space to explore and express thoughts and feelings associated with recent life events, and traumas. Patient described the death of her father in detail. Help patient identify thoughts and feelings associated with this event, and overall psychological impact. Help patient identify positive coping strategies to help when  she's having a bad day. Patient reports that her husband is a good support system, and her son is a good support system.Continued recommendations are as follows: self care behaviors, positive social engagements, focusing on overall work/home/life balance, and focusing on positive physical and emotional wellness.  .   Suicidal/Homicidal: No  Therapist Response: Initial session with pt--assess symptoms and develop tx plan. Treatment to continue as indicated.   Plan: Return again in 4 weeks.  Diagnosis: Axis I: Bipolar, Depressed, Bereavement, and Post Traumatic Stress Disorder    Axis II: No diagnosis    Ernest Haber Parley Pidcock, LCSW 02/18/2021

## 2021-02-19 DIAGNOSIS — G43909 Migraine, unspecified, not intractable, without status migrainosus: Secondary | ICD-10-CM | POA: Insufficient documentation

## 2021-02-20 ENCOUNTER — Encounter (HOSPITAL_COMMUNITY): Payer: Medicare Other

## 2021-02-25 ENCOUNTER — Encounter (HOSPITAL_COMMUNITY)
Admission: RE | Admit: 2021-02-25 | Discharge: 2021-02-25 | Disposition: A | Discharge status: Home / Self Care | Payer: Medicare Other | Source: Ambulatory Visit | Attending: Pulmonary Disease | Admitting: Pulmonary Disease

## 2021-02-25 ENCOUNTER — Other Ambulatory Visit: Payer: Self-pay

## 2021-02-25 DIAGNOSIS — J45909 Unspecified asthma, uncomplicated: Secondary | ICD-10-CM | POA: Diagnosis not present

## 2021-02-25 NOTE — Progress Notes (Signed)
Daily Session Note  Patient Details  Name: Kristen Ryan MRN: [id number] Date of Birth: May 23, 1956 Referring Provider:   Flowsheet Row PULMONARY REHAB OTHER RESP ORIENTATION from 02/17/2021 in [place]  Referring Provider Dr. Lanney Gins       Encounter Date: 02/25/2021  Check In:  Session Check In - 02/25/21 1330       Check-In   Supervising physician immediately available to respond to emergencies [facility] MD immediately available    Physician(s) Dr. Harl Bowie    Location [facility]    Staff Present Geanie Cooley, RN;Dalton Kris Mouton, MS, ACSM-CEP, Exercise Physiologist    Virtual Visit No    Medication changes reported     No    Fall or balance concerns reported    Yes    Comments using a cane for balance    Tobacco Cessation No Change    Resistance Training Performed No    VAD Patient? No    PAD/SET Patient? No      Pain Assessment   Currently in Pain? Yes    Pain Score 7     Pain Location Back    Pain Orientation Upper    Pain Descriptors / Indicators Constant    Pain Type Chronic pain    Pain Onset More than a month ago    Pain Frequency Constant    Pain Relieving Factors percocet 69m 3 x day    Multiple Pain Sites No             Capillary Blood Glucose: No results found for this or any previous visit (from the past 24 hour(s)).    Social History   Tobacco Use  Smoking Status Former   Packs/day: 4.00   Types: Cigarettes   Quit date: 07/06/2008   Years since quitting: 12.6  Smokeless Tobacco Never    Goals Met:  Proper associated with RPD/PD & O2 Sat Independence with exercise equipment Using PLB without cueing & demonstrates good technique Exercise tolerated well No report of cardiac concerns or symptoms Strength training completed today  Goals Unmet:  Not Applicable  Comments: check out @ 2:30pm   Dr. JKathie Dikeis [facility] Director [facility].

## 2021-02-27 ENCOUNTER — Other Ambulatory Visit: Payer: Self-pay

## 2021-02-27 ENCOUNTER — Encounter (HOSPITAL_COMMUNITY)
Admission: RE | Admit: 2021-02-27 | Discharge: 2021-02-27 | Disposition: A | Discharge status: Home / Self Care | Payer: Medicare Other | Source: Ambulatory Visit | Attending: Pulmonary Disease | Admitting: Pulmonary Disease

## 2021-02-27 DIAGNOSIS — J45909 Unspecified asthma, uncomplicated: Secondary | ICD-10-CM

## 2021-02-27 NOTE — Progress Notes (Signed)
Daily Session Note  Patient Details  Name: Kristen Ryan MRN: [id number] Date of Birth: [birth year] Referring Provider:   Flowsheet Row PULMONARY REHAB OTHER RESP ORIENTATION from 02/17/2021 in [place]  Referring Provider Dr. Lanney Gins       Encounter Date: 02/27/2021  Check In:  Session Check In - 02/27/21 1330       Check-In   Supervising physician immediately available to respond to emergencies [facility] immediately available    Physician(s) Dr. Harl Bowie    [facility]    Staff Present Geanie Cooley, RN;Debra Wynetta Emery, RN, BSN    Virtual Visit No    Medication changes reported     No    Fall or balance concerns reported    Yes    Comments using a cane for balance    Tobacco Cessation No Change    Warm-up and Cool-down Performed as group-led instruction    Resistance Training Performed Yes    VAD Patient? No    PAD/SET Patient? No      Pain Assessment   Currently in Pain? Yes    Pain Score 7     Pain Location Back    Pain Orientation Upper    Pain Descriptors / Indicators Constant    Pain Type Chronic pain    Pain Onset More than a month ago    Pain Frequency Constant    Pain Relieving Factors percocet 13m 3 x day    Multiple Pain Sites No             Capillary Blood Glucose: No results found for this or any previous visit (from the past 24 hour(s)).    Social History   Tobacco Use  Smoking Status Former   Packs/day: 4.00   Types: Cigarettes   Quit date: 07/06/2008   Years since quitting: 12.6  Smokeless Tobacco Never    Goals Met:  Proper associated with RPD/PD & O2 Sat Independence with exercise equipment Exercise tolerated well No report of concerns or symptoms today Strength training completed today  Goals Unmet:  Not Applicable  Comments: check out @ 2:30pm   Dr. JKathie Dikeis Medical Director [facility].

## 2021-03-04 ENCOUNTER — Encounter (HOSPITAL_COMMUNITY): Payer: Medicare Other

## 2021-03-06 ENCOUNTER — Encounter (HOSPITAL_COMMUNITY)
Admission: RE | Admit: 2021-03-06 | Discharge: 2021-03-06 | Disposition: A | Discharge status: Home / Self Care | Payer: Medicare Other | Source: Ambulatory Visit | Attending: Pulmonary Disease | Admitting: Pulmonary Disease

## 2021-03-06 ENCOUNTER — Other Ambulatory Visit: Payer: Self-pay

## 2021-03-06 DIAGNOSIS — J45909 Unspecified asthma, uncomplicated: Secondary | ICD-10-CM | POA: Diagnosis not present

## 2021-03-06 NOTE — Progress Notes (Signed)
Daily Session Note  Patient Details  Name: Kristen Ryan MRN: [id number] Date of Birth: 03/01/1956 Referring Provider:   Flowsheet Row PULMONARY REHAB OTHER RESP ORIENTATION from 02/17/2021 in [place] [facility]  Referring Provider Dr. Aleskerov       Encounter Date: 03/06/2021  Check In:  Session Check In - 03/06/21 1330       Check-In   Supervising physician immediately available to respond to emergencies [facility] immediately available    Physician(s) Dr. Branch    Location [facility]    Staff Present  , RN, BSN;Dalton Fletcher, MS, ACSM-CEP, Exercise Physiologist    Virtual Visit No    Medication changes reported     No    Fall or balance concerns reported    Yes    Comments using a cane for balance    Tobacco Cessation No Change    Warm-up and Cool-down Performed as group-led instruction    Resistance Training Performed Yes    VAD Patient? No    PAD/SET Patient? No      Pain Assessment   Currently in Pain? Yes    Pain Score 8     Pain Location Back    Pain Orientation Upper    Pain Descriptors / Indicators Constant    Pain Type Chronic pain    Pain Onset More than a month ago    Pain Frequency Constant    Pain Relieving Factors Percocet 5 mb    Multiple Pain Sites No             Capillary Blood Glucose: No results found for this or any previous visit (from the past 24 hour(s)).    Social History   Tobacco Use  Smoking Status Former   Packs/day: 4.00   Types: Cigarettes   Quit date: 07/06/2008   Years since quitting: 12.6  Smokeless Tobacco Never    Goals Met:  Proper associated with RPD/PD & O2 Sat Independence with exercise equipment Using PLB without cueing & demonstrates good technique Exercise tolerated well No report of concerns or symptoms today Strength training completed today  Goals Unmet:  Not Applicable  Comments: Check out 1430.   Dr. Jehanzeb Memon is Medical Director for [facility]. 

## 2021-03-11 ENCOUNTER — Encounter (HOSPITAL_COMMUNITY)
Admission: RE | Admit: 2021-03-11 | Discharge: 2021-03-11 | Disposition: A | Discharge status: Home / Self Care | Payer: Medicare Other | Source: Ambulatory Visit | Attending: Pulmonary Disease | Admitting: Pulmonary Disease

## 2021-03-11 ENCOUNTER — Other Ambulatory Visit: Payer: Self-pay

## 2021-03-11 DIAGNOSIS — J45909 Unspecified asthma, uncomplicated: Secondary | ICD-10-CM | POA: Diagnosis not present

## 2021-03-11 NOTE — Progress Notes (Signed)
Daily Session Note  Patient Details  Name: Kristen Ryan MRN: [id number] Date of Birth: 1956/05/25 Referring Provider:   Flowsheet Row PULMONARY REHAB OTHER RESP ORIENTATION from 02/17/2021 in [place]  Referring Provider Dr. Lanney Gins       Encounter Date: 03/11/2021  Check In:  Session Check In - 03/11/21 1330       Check-In   Supervising physician immediately available to respond to emergencies [facility] immediately available    Physician(s) Dr. Harrington Challenger    [facility]    Staff Present Geanie Cooley, RN;Dalton Fletcher, MS, ACSM-CEP, Exercise Physiologist    Virtual Visit No    Medication changes reported     No    Fall or balance concerns reported    Yes    Comments using a cane for balance    Tobacco Cessation No Change    Warm-up and Cool-down Performed as group-led instruction    Resistance Training Performed Yes    VAD Patient? No    PAD/SET Patient? No      Pain Assessment   Currently in Pain? Yes    Pain Score 7     Pain Location Back    Pain Orientation Upper    Pain Descriptors / Indicators Constant    Pain Type Chronic pain    Pain Onset More than a month ago    Pain Frequency Constant    Multiple Pain Sites No             Capillary Blood Glucose: No results found for this or any previous visit (from the past 24 hour(s)).    Social History   Tobacco Use  Smoking Status Former   Packs/day: 4.00   Types: Cigarettes   Quit date: 07/06/2008   Years since quitting: 12.6  Smokeless Tobacco Never    Goals Met:  Proper associated with RPD/PD & O2 Sat Independence with exercise equipment Improved SOB with ADL's Exercise tolerated well No report of concerns or symptoms today Strength training completed today  Goals Unmet:  Not Applicable  Comments: check out @ 2:30pm   Dr. Kathie Dike is Medical Director for [facility].

## 2021-03-13 ENCOUNTER — Other Ambulatory Visit: Payer: Self-pay

## 2021-03-13 ENCOUNTER — Encounter (HOSPITAL_COMMUNITY)
Admission: RE | Admit: 2021-03-13 | Discharge: 2021-03-13 | Disposition: A | Discharge status: Home / Self Care | Payer: Medicare Other | Source: Ambulatory Visit | Attending: Pulmonary Disease | Admitting: Pulmonary Disease

## 2021-03-13 DIAGNOSIS — J45909 Unspecified asthma, uncomplicated: Secondary | ICD-10-CM | POA: Diagnosis not present

## 2021-03-13 NOTE — Progress Notes (Signed)
Daily Session Note  Patient Details  Name: Kristen Ryan MRN: [id number] Date of Birth: 09-03-1955 Referring Provider:   Flowsheet Row PULMONARY REHAB OTHER RESP ORIENTATION from 02/17/2021 in [place]  Referring Provider Dr. Lanney Gins       Encounter Date: 03/13/2021  Check In:  Session Check In - 03/13/21 1330       Check-In   Supervising physician immediately available to respond to emergencies [facility] immediately available    Physician(s) Dr. Harl Bowie    [facility]    Staff Present Geanie Cooley, RN;Debra Wynetta Emery, RN, BSN    Virtual Visit No    Medication changes reported     No    Fall or balance concerns reported    Yes    Comments using a cane for balance    Tobacco Cessation No Change    Warm-up and Cool-down Performed as group-led instruction    Resistance Training Performed Yes    VAD Patient? No    PAD/SET Patient? No      Pain Assessment   Pain Score 8     Pain Location Back    Pain Orientation Upper    Pain Descriptors / Indicators Constant    Pain Type Chronic pain    Pain Onset More than a month ago    Pain Frequency Constant    Multiple Pain Sites No             Capillary Blood Glucose: No results found for this or any previous visit (from the past 24 hour(s)).    Social History   Tobacco Use  Smoking Status Former   Packs/day: 4.00   Types: Cigarettes   Quit date: 07/06/2008   Years since quitting: 12.6  Smokeless Tobacco Never    Goals Met:  Proper associated with RPD/PD & O2 Sat Independence with exercise equipment Improved SOB with ADL's No report of concerns or symptoms today Strength training completed today  Goals Unmet:  Not Applicable  Comments: check out @ 2:45pm   Dr. Kathie Dike is Medical Director for [facility] Re[facility].

## 2021-03-17 ENCOUNTER — Other Ambulatory Visit: Payer: Self-pay

## 2021-03-17 ENCOUNTER — Ambulatory Visit (HOSPITAL_BASED_OUTPATIENT_CLINIC_OR_DEPARTMENT_OTHER): Payer: Self-pay | Admitting: Licensed Clinical Social Worker

## 2021-03-17 DIAGNOSIS — Z5329 Procedure and treatment not carried out because of patient's decision for other reasons: Secondary | ICD-10-CM

## 2021-03-17 NOTE — Progress Notes (Signed)
LCSW counselor tried to connect with patient for scheduled appointment via MyChart video text request x 2 and email request; also tried to connect via phone without success. LCSW counselor left message for patient to call office number to reschedule OPT appointment.  

## 2021-03-18 ENCOUNTER — Encounter (HOSPITAL_COMMUNITY)
Admission: RE | Admit: 2021-03-18 | Discharge: 2021-03-18 | Disposition: A | Discharge status: Home / Self Care | Payer: Medicare Other | Source: Ambulatory Visit | Attending: Pulmonary Disease | Admitting: Pulmonary Disease

## 2021-03-18 VITALS — Wt 144.6 lb

## 2021-03-18 DIAGNOSIS — J45909 Unspecified asthma, uncomplicated: Secondary | ICD-10-CM

## 2021-03-18 NOTE — Progress Notes (Signed)
Daily Session Note  Patient Details  Name: Kristen Ryan MRN: [id number] Date of Birth: 05/04/1956 Referring Provider:   Flowsheet Row PULMONARY REHAB OTHER RESP ORIENTATION from 02/17/2021 in [place]  Referring Provider Dr. Lanney Gins       Encounter Date: 03/18/2021  Check In:  Session Check In - 03/18/21 1330       Check-In   Supervising physician immediately available to respond to emergencies [facility] immediately available    Physician(s) Dr. Johnsie Cancel    Location [facility]    Staff Present Hoy Register, MS, ACSM-CEP, Exercise Physiologist;Phyllis Billingsley, RN;Other    Virtual Visit No    Medication changes reported     No    Fall or balance concerns reported    Yes    Comments using a cane for balance    Tobacco Cessation No Change    Warm-up and Cool-down Performed as group-led instruction    Resistance Training Performed Yes    VAD Patient? No    PAD/SET Patient? No      Pain Assessment   Currently in Pain? Yes    Pain Score 8     Pain Location Back    Pain Orientation Upper    Pain Descriptors / Indicators Constant    Pain Type Chronic pain    Pain Onset More than a month ago    Pain Frequency Constant    Multiple Pain Sites No             Capillary Blood Glucose: No results found for this or any previous visit (from the past 24 hour(s)).    Social History   Tobacco Use  Smoking Status Former   Packs/day: 4.00   Types: Cigarettes   Quit date: 07/06/2008   Years since quitting: 12.7  Smokeless Tobacco Never    Goals Met:  Independence with exercise equipment Exercise tolerated well No report of concerns or symptoms today Strength training completed today  Goals Unmet:  Not Applicable  Comments: checkout time is 1430   Dr. Kathie Dike is Medical Director for [facility].

## 2021-03-19 NOTE — Progress Notes (Signed)
Pulmonary Individual Treatment Plan  Patient Details  Name: Kristen Ryan MRN: [id number] Date of Birth: 1956/02/27 Referring Provider:   Flowsheet Row PULMONARY REHAB OTHER RESP ORIENTATION from 02/17/2021 in [facility] CARDIAC REHABILITATION  Referring Provider Dr. Karna Christmas       Initial Encounter Date:  Flowsheet Row PULMONARY REHAB OTHER RESP ORIENTATION from 02/17/2021 in [facility] CARDIAC RE[facility]  Date 02/17/21       Visit Diagnosis: Uncomplicated asthma, unspecified asthma severity, unspecified whether persistent  Patient's Home Medications on Admission:   Current Outpatient Medications:    albuterol (PROVENTIL HFA;VENTOLIN HFA) 108 (90 Base) MCG/ACT inhaler, Inhale 1-2 puffs into the lungs every 6 (six) hours as needed for wheezing or shortness of breath., Disp: 1 Inhaler, Rfl: 0   aspirin 325 MG tablet, Take 325 mg by mouth daily., Disp: , Rfl:    budesonide (PULMICORT) 1 MG/2ML nebulizer solution, Inhale into the lungs., Disp: , Rfl:    calcium-vitamin D (OSCAL WITH D) 500-200 MG-UNIT tablet, Take 1 tablet by mouth 2 (two) times daily., Disp: , Rfl:    diclofenac sodium (VOLTAREN) 1 % GEL, SMARTSIG:2-4 Gram(s) Topical Daily, Disp: , Rfl:    dicyclomine (BENTYL) 10 MG capsule, Take 1 capsule by mouth 3 (three) times daily., Disp: , Rfl:    Dupilumab (DUPIXENT) 300 MG/2ML SOPN, Inject 300 mg into the skin every 14 (fourteen) days., Disp: , Rfl:    EPINEPHrine 0.3 mg/0.3 mL IJ SOAJ injection, INJECT (0.3)ML INTO THEUMUSCLE ONCE AS NEEDED FORNANAPHYLAXIS FOR ONE DOSE., Disp: , Rfl:    ferrous sulfate 220 (44 Fe) MG/5ML solution, Take 220 mg by mouth 2 (two) times a day., Disp: , Rfl:    ferrous sulfate 325 (65 FE) MG tablet, Take 325 mg by mouth 2 (two) times daily before a meal., Disp: , Rfl:    gabapentin (NEURONTIN) 300 MG capsule, Take 300 mg by mouth 2 (two) times daily., Disp: , Rfl:    gabapentin (NEURONTIN) 600 MG tablet, TAKE (1) TABLET BY MOUTH TWICE  DAILY., Disp: 180 tablet, Rfl: 0   hydrochlorothiazide (HYDRODIURIL) 25 MG tablet, Take 1 tablet by mouth daily., Disp: , Rfl:    hydrOXYzine (ATARAX/VISTARIL) 25 MG tablet, Take 1 tablet (25 mg total) by mouth 3 (three) times daily as needed for anxiety., Disp: 270 tablet, Rfl: 0   levothyroxine (SYNTHROID) 50 MCG tablet, Take 1 tablet by mouth daily., Disp: , Rfl:    loratadine (CLARITIN) 10 MG tablet, Take 10 mg by mouth daily. (Patient not taking: Reported on 09/03/2020), Disp: , Rfl:    Magnesium 250 MG TABS, Take by mouth., Disp: , Rfl:    meclizine (ANTIVERT) 25 MG tablet, Take 25 mg by mouth 2 (two) times daily as needed., Disp: , Rfl:    meloxicam (MOBIC) 15 MG tablet, Take 15 mg by mouth at bedtime., Disp: , Rfl:    methocarbamol (ROBAXIN) 500 MG tablet, Take 1 tablet (500 mg total) by mouth 3 (three) times daily. (Patient not taking: Reported on 02/17/2021), Disp: 21 tablet, Rfl: 0   metoprolol tartrate (LOPRESSOR) 25 MG tablet, Take 25 mg by mouth daily., Disp: , Rfl:    metroNIDAZOLE (FLAGYL) 500 MG tablet, Take 1 tablet (500 mg total) by mouth 3 (three) times daily., Disp: 21 tablet, Rfl: 0   montelukast (SINGULAIR) 10 MG tablet, Take 10 mg by mouth at bedtime., Disp: , Rfl:    Multiple Vitamin (MULTIVITAMIN WITH MINERALS) TABS tablet, Take 1 tablet by mouth daily., Disp: , Rfl:  omeprazole (PRILOSEC) 40 MG capsule, Take 40 mg by mouth 2 (two) times daily before a meal., Disp: , Rfl:    ondansetron (ZOFRAN) 4 MG tablet, Take 4 mg by mouth as needed., Disp: , Rfl:    oxyCODONE-acetaminophen (PERCOCET) 7.5-325 MG tablet, Take 7.5 tablets by mouth every 6 (six) hours as needed., Disp: , Rfl:    oxyCODONE-acetaminophen (PERCOCET/ROXICET) 5-325 MG tablet, Take 1-2 tablets by mouth every 6 (six) hours as needed for severe pain. (Patient not taking: Reported on 02/17/2021), Disp: 7 tablet, Rfl: 0   rosuvastatin (CRESTOR) 20 MG tablet, Take 20 mg by mouth at bedtime. (Patient not taking:  Reported on 02/17/2021), Disp: , Rfl:    sodium chloride (OCEAN) 0.65 % SOLN nasal spray, Place 1 spray into both nostrils as needed for congestion., Disp: , Rfl:    TRELEGY ELLIPTA 100-62.5-25 MCG/INH AEPB, , Disp: , Rfl:    triamcinolone ointment (KENALOG) 0.1 %, Apply 1 application topically daily as needed., Disp: , Rfl:    vitamin E 180 MG (400 UNITS) capsule, Take 400 Units by mouth at bedtime., Disp: , Rfl:    vortioxetine HBr (TRINTELLIX) 20 MG TABS tablet, Take 1 tablet (20 mg total) by mouth daily., Disp: 90 tablet, Rfl: 0   ziprasidone (GEODON) 80 MG capsule, Take 1 capsule (80 mg total) by mouth 2 (two) times daily with a meal., Disp: 180 capsule, Rfl: 0  Past Medical History: Past Medical History:  Diagnosis Date   Anemia    Asthma    Bipolar 1 disorder (HCC)    Bronchitis    COPD (chronic obstructive pulmonary disease) (HCC)    Diverticulitis    IBS (irritable bowel syndrome)    Migraine    Neuropathy    Schizophrenia (HCC)    Thyroid disease     Tobacco Use: Social History   Tobacco Use  Smoking Status Former   Packs/day: 4.00   Types: Cigarettes   Quit date: 07/06/2008   Years since quitting: 12.7  Smokeless Tobacco Never    Labs: Recent Review Advice worker     Labs for ITP Cardiac and Pulmonary Rehab Latest Ref Rng & Units 07/07/2010   PHART 7.350 - 7.400 7.355   PCO2ART 35.0 - 45.0 mmHg 45.0   HCO3 20.0 - 24.0 mEq/L 24.5(H)   ACIDBASEDEF 0.0 - 2.0 mmol/L 0.3   O2SAT % 98.3       Capillary Blood Glucose: Lab Results  Component Value Date   GLUCAP 101 (H) 08/06/2015   GLUCAP 109 (H) 08/05/2015   GLUCAP 88 08/04/2015     Pulmonary Assessment Scores:  Pulmonary Assessment Scores     Row Name 02/17/21 1254         ADL UCSD   ADL Phase Entry     SOB Score total 51           CAT Score   CAT Score 15           mMRC Score   mMRC Score 3             UCSD: Self-administered rating of dyspnea associated with activities of daily  living (ADLs) 6-point scale (0 = "not at all" to 5 = "maximal or unable to do because of breathlessness")  Scoring Scores range from 0 to 120.  Minimally important difference is 5 units  CAT: CAT can identify the health impairment of COPD patients and is better correlated with disease progression.  CAT has a scoring range of zero  to 40. The CAT score is classified into four groups of low (less than 10), medium (10 - 20), high (21-30) and very high (31-40) based on the impact level of disease on health status. A CAT score over 10 suggests significant symptoms.  A worsening CAT score could be explained by an exacerbation, poor medication adherence, poor inhaler technique, or progression of COPD or comorbid conditions.  CAT MCID is 2 points  mMRC: mMRC (Modified Medical Research Council) Dyspnea Scale is used to assess the degree of baseline functional disability in patients of respiratory disease due to dyspnea. No minimal important difference is established. A decrease in score of 1 point or greater is considered a positive change.   Pulmonary Function Assessment:   Exercise Target Goals: Exercise Program Goal: Individual exercise prescription set using results from initial 6 min walk test and THRR while considering  patient's activity barriers and safety.   Exercise Prescription Goal: Initial exercise prescription builds to 30-45 minutes a day of aerobic activity, 2-3 days per week.  Home exercise guidelines will be given to patient during program as part of exercise prescription that the participant will acknowledge.  Activity Barriers & Risk Stratification:  Activity Barriers & Cardiac Risk Stratification - 02/17/21 1254       Activity Barriers & Cardiac Risk Stratification   Activity Barriers Arthritis;Back Problems;Neck/Spine Problems;Joint Problems;Deconditioning;Shortness of Breath;History of Falls;Balance Concerns    Cardiac Risk Stratification Moderate             6  Minute Walk:  6 Minute Walk     Row Name 02/17/21 1553         6 Minute Walk   Phase Initial     Distance 800 feet     Walk Time 6 minutes     # of Rest Breaks 1     MPH 1.52     METS 2.52     RPE 12     Perceived Dyspnea  11     VO2 Peak 8.81     Symptoms Yes (comment)     Comments 1 minute standing break due to desaturation and to get O2 placed on via nasual cannula. Back pain 8/10     Resting HR 65 bpm     Resting BP 102/60     Resting Oxygen Saturation  91 %     Exercise Oxygen Saturation  during 6 min walk 81 %     Max Ex. HR 99 bpm     Max Ex. BP 134/70     2 Minute Post BP 110/62           Interval HR   1 Minute HR 75     2 Minute HR 79     3 Minute HR 81     4 Minute HR 89     5 Minute HR 91     6 Minute HR 99     2 Minute Post HR 79     Interval Heart Rate? Yes           Interval Oxygen   Interval Oxygen? Yes     Baseline Oxygen Saturation % 91 %     1 Minute Oxygen Saturation % 88 %     1 Minute Liters of Oxygen 0 L     2 Minute Oxygen Saturation % 81 %     2 Minute Liters of Oxygen 0 L     3 Minute Oxygen Saturation % 95 %  3 Minute Liters of Oxygen 2 L     4 Minute Oxygen Saturation % 98 %     4 Minute Liters of Oxygen 2 L     5 Minute Oxygen Saturation % 100 %     5 Minute Liters of Oxygen 2 L     6 Minute Oxygen Saturation % 96 %     6 Minute Liters of Oxygen 2 L     2 Minute Post Oxygen Saturation % 99 %     2 Minute Post Liters of Oxygen 2 L              Oxygen Initial Assessment:  Oxygen Initial Assessment - 02/17/21 1558       Initial 6 min Walk   Oxygen Used Continuous    Liters per minute 2      Program Oxygen Prescription   Program Oxygen Prescription Continuous    Liters per minute 2      Intervention   Short Term Goals To learn and exhibit compliance with exercise, home and travel O2 prescription;To learn and understand importance of monitoring SPO2 with pulse oximeter and demonstrate accurate use of the pulse  oximeter.;To learn and understand importance of maintaining oxygen saturations>88%;To learn and demonstrate proper pursed lip breathing techniques or other breathing techniques. ;To learn and demonstrate proper use of respiratory medications    Long  Term Goals Exhibits compliance with exercise, home  and travel O2 prescription;Verbalizes importance of monitoring SPO2 with pulse oximeter and return demonstration;Maintenance of O2 saturations>88%;Exhibits proper breathing techniques, such as pursed lip breathing or other method taught during program session;Compliance with respiratory medication;Demonstrates proper use of MDI's             Oxygen Re-Evaluation:  Oxygen Re-Evaluation     Row Name 03/18/21 1635             Program Oxygen Prescription   Program Oxygen Prescription Continuous;None               Home Oxygen   Home Oxygen Device Home Concentrator;E-Tanks       Sleep Oxygen Prescription None       Home Exercise Oxygen Prescription Continuous       Liters per minute 2       Home Resting Oxygen Prescription None       Compliance with Home Oxygen Use Yes               Goals/Expected Outcomes   Short Term Goals To learn and exhibit compliance with exercise, home and travel O2 prescription;To learn and understand importance of monitoring SPO2 with pulse oximeter and demonstrate accurate use of the pulse oximeter.;To learn and understand importance of maintaining oxygen saturations>88%;To learn and demonstrate proper pursed lip breathing techniques or other breathing techniques. ;To learn and demonstrate proper use of respiratory medications       Long  Term Goals Exhibits compliance with exercise, home  and travel O2 prescription;Verbalizes importance of monitoring SPO2 with pulse oximeter and return demonstration;Maintenance of O2 saturations>88%;Exhibits proper breathing techniques, such as pursed lip breathing or other method taught during program session;Compliance with  respiratory medication;Demonstrates proper use of MDI's       Goals/Expected Outcomes compliance                Oxygen Discharge (Final Oxygen Re-Evaluation):  Oxygen Re-Evaluation - 03/18/21 1635       Program Oxygen Prescription   Program Oxygen Prescription Continuous;None  Home Oxygen   Home Oxygen Device Home Concentrator;E-Tanks    Sleep Oxygen Prescription None    Home Exercise Oxygen Prescription Continuous    Liters per minute 2    Home Resting Oxygen Prescription None    Compliance with Home Oxygen Use Yes      Goals/Expected Outcomes   Short Term Goals To learn and exhibit compliance with exercise, home and travel O2 prescription;To learn and understand importance of monitoring SPO2 with pulse oximeter and demonstrate accurate use of the pulse oximeter.;To learn and understand importance of maintaining oxygen saturations>88%;To learn and demonstrate proper pursed lip breathing techniques or other breathing techniques. ;To learn and demonstrate proper use of respiratory medications    Long  Term Goals Exhibits compliance with exercise, home  and travel O2 prescription;Verbalizes importance of monitoring SPO2 with pulse oximeter and return demonstration;Maintenance of O2 saturations>88%;Exhibits proper breathing techniques, such as pursed lip breathing or other method taught during program session;Compliance with respiratory medication;Demonstrates proper use of MDI's    Goals/Expected Outcomes compliance             Initial Exercise Prescription:  Initial Exercise Prescription - 02/17/21 1500       Date of Initial Exercise RX and Referring Provider   Date 02/17/21    Referring Provider Dr. Karna Christmas    Expected Discharge Date 06/24/21      Oxygen   Oxygen Continuous    Liters 2      Treadmill   MPH 1    Grade 0    Minutes 17      NuStep   Level 1    SPM 60    Minutes 22      Prescription Details   Frequency (times per week) 2    Duration  Progress to 30 minutes of continuous aerobic without signs/symptoms of physical distress      Intensity   THRR 40-80% of Max Heartrate 62-124    Ratings of Perceived Exertion 11-13    Perceived Dyspnea 0-4      Resistance Training   Training Prescription Yes    Weight 2 lbs    Reps 10-15             Perform Capillary Blood Glucose checks as needed.  Exercise Prescription Changes:   Exercise Prescription Changes     Row Name 02/27/21 1315 03/18/21 1600           Response to Exercise   Blood Pressure (Admit) 100/60 100/58      Blood Pressure (Exercise) 102/60 120/82      Blood Pressure (Exit) 98/50 110/60      Heart Rate (Admit) 74 bpm 60 bpm      Heart Rate (Exercise) 100 bpm 84 bpm      Heart Rate (Exit) 88 bpm 67 bpm      Oxygen Saturation (Admit) 95 % 93 %      Oxygen Saturation (Exercise) 90 % 90 %      Oxygen Saturation (Exit) 94 % 90 %      Rating of Perceived Exertion (Exercise) 12 11      Perceived Dyspnea (Exercise) 12 11      Duration Continue with 30 min of aerobic exercise without signs/symptoms of physical distress. Continue with 30 min of aerobic exercise without signs/symptoms of physical distress.      Intensity THRR unchanged THRR unchanged             Progression   Progression Continue to progress workloads  to maintain intensity without signs/symptoms of physical distress. Continue to progress workloads to maintain intensity without signs/symptoms of physical distress.             Resistance Training   Training Prescription Yes Yes      Weight 2 lbs 2 lbs      Reps 10-15 10-15      Time 10 Minutes 10 Minutes             Treadmill   MPH 1.3 1.5      Grade 0 0      Minutes 17 17      METs 2 2.15             Arm Ergometer   Level 1 1      Minutes 22 22      METs 1 2               Exercise Comments:   Exercise Goals and Review:   Exercise Goals     Row Name 02/17/21 1557 03/18/21 1637           Exercise Goals    Increase Physical Activity Yes Yes      Intervention Provide advice, education, support and counseling about physical activity/exercise needs.;Develop an individualized exercise prescription for aerobic and resistive training based on initial evaluation findings, risk stratification, comorbidities and participant's personal goals. Provide advice, education, support and counseling about physical activity/exercise needs.;Develop an individualized exercise prescription for aerobic and resistive training based on initial evaluation findings, risk stratification, comorbidities and participant's personal goals.      Expected Outcomes Short Term: Attend rehab on a regular basis to increase amount of physical activity.;Long Term: Add in home exercise to make exercise part of routine and to increase amount of physical activity.;Long Term: Exercising regularly at least 3-5 days a week. Short Term: Attend rehab on a regular basis to increase amount of physical activity.;Long Term: Add in home exercise to make exercise part of routine and to increase amount of physical activity.;Long Term: Exercising regularly at least 3-5 days a week.      Increase Strength and Stamina Yes Yes      Intervention Provide advice, education, support and counseling about physical activity/exercise needs.;Develop an individualized exercise prescription for aerobic and resistive training based on initial evaluation findings, risk stratification, comorbidities and participant's personal goals. Provide advice, education, support and counseling about physical activity/exercise needs.;Develop an individualized exercise prescription for aerobic and resistive training based on initial evaluation findings, risk stratification, comorbidities and participant's personal goals.      Expected Outcomes Short Term: Increase workloads from initial exercise prescription for resistance, speed, and METs.;Short Term: Perform resistance training exercises routinely  during rehab and add in resistance training at home;Long Term: Improve cardiorespiratory fitness, muscular endurance and strength as measured by increased METs and functional capacity ( ) Short Term: Increase workloads from initial exercise prescription for resistance, speed, and METs.;Short Term: Perform resistance training exercises routinely during rehab and add in resistance training at home;Long Term: Improve cardiorespiratory fitness, muscular endurance and strength as measured by increased METs and functional capacity ( )      Able to understand and use rate of perceived exertion (RPE) scale Yes Yes      Intervention Provide education and explanation on how to use RPE scale Provide education and explanation on how to use RPE scale      Expected Outcomes Short Term: Able to use RPE daily in rehab to express subjective intensity level;Long  Term:  Able to use RPE to guide intensity level when exercising independently Short Term: Able to use RPE daily in rehab to express subjective intensity level;Long Term:  Able to use RPE to guide intensity level when exercising independently      Able to understand and use Dyspnea scale Yes Yes      Intervention Provide education and explanation on how to use Dyspnea scale Provide education and explanation on how to use Dyspnea scale      Expected Outcomes Short Term: Able to use Dyspnea scale daily in rehab to express subjective sense of shortness of breath during exertion;Long Term: Able to use Dyspnea scale to guide intensity level when exercising independently Short Term: Able to use Dyspnea scale daily in rehab to express subjective sense of shortness of breath during exertion;Long Term: Able to use Dyspnea scale to guide intensity level when exercising independently      Knowledge and understanding of Target Heart Rate Range (THRR) Yes Yes      Intervention Provide education and explanation of THRR including how the numbers were predicted and where they  are located for reference Provide education and explanation of THRR including how the numbers were predicted and where they are located for reference      Expected Outcomes Short Term: Able to state/look up THRR;Long Term: Able to use THRR to govern intensity when exercising independently;Short Term: Able to use daily as guideline for intensity in rehab Short Term: Able to state/look up THRR;Long Term: Able to use THRR to govern intensity when exercising independently;Short Term: Able to use daily as guideline for intensity in rehab      Understanding of Exercise Prescription Yes Yes      Intervention Provide education, explanation, and written materials on patient's individual exercise prescription Provide education, explanation, and written materials on patient's individual exercise prescription      Expected Outcomes Short Term: Able to explain program exercise prescription;Long Term: Able to explain home exercise prescription to exercise independently Short Term: Able to explain program exercise prescription;Long Term: Able to explain home exercise prescription to exercise independently               Exercise Goals Re-Evaluation :  Exercise Goals Re-Evaluation     Row Name 02/18/21 1247 03/18/21 1637           Exercise Goal Re-Evaluation   Exercise Goals Review Increase Physical Activity;Increase Strength and Stamina;Able to understand and use rate of perceived exertion (RPE) scale;Knowledge and understanding of Target Heart Rate Range (THRR);Able to check pulse independently;Understanding of Exercise Prescription Increase Physical Activity;Increase Strength and Stamina;Able to understand and use rate of perceived exertion (RPE) scale;Knowledge and understanding of Target Heart Rate Range (THRR);Able to check pulse independently;Understanding of Exercise Prescription      Comments Pt will begin exercise on 02/20/21. We will monitor and progress her as able. Pt has completed 6 exercise  sessions. She gives good effort while here, but has been limited by her knee and back pain. She is currently exercising at 2.0 METs on the arm ergometer.      Expected Outcomes Through exercise at rehab and at home, the patient will meet their stated goals. Through exercise at rehab and at home, the patient will meet their stated goals.               Discharge Exercise Prescription (Final Exercise Prescription Changes):  Exercise Prescription Changes - 03/18/21 1600       Response to Exercise  Blood Pressure (Admit) 100/58    Blood Pressure (Exercise) 120/82    Blood Pressure (Exit) 110/60    Heart Rate (Admit) 60 bpm    Heart Rate (Exercise) 84 bpm    Heart Rate (Exit) 67 bpm    Oxygen Saturation (Admit) 93 %    Oxygen Saturation (Exercise) 90 %    Oxygen Saturation (Exit) 90 %    Rating of Perceived Exertion (Exercise) 11    Perceived Dyspnea (Exercise) 11    Duration Continue with 30 min of aerobic exercise without signs/symptoms of physical distress.    Intensity THRR unchanged      Progression   Progression Continue to progress workloads to maintain intensity without signs/symptoms of physical distress.      Resistance Training   Training Prescription Yes    Weight 2 lbs    Reps 10-15    Time 10 Minutes      Treadmill   MPH 1.5    Grade 0    Minutes 17    METs 2.15      Arm Ergometer   Level 1    Minutes 22    METs 2             Nutrition:  Target Goals: Understanding of nutrition guidelines, daily intake of sodium 1500mg , cholesterol 200mg , calories 30% from fat and 7% or less from saturated fats, daily to have 5 or more servings of fruits and vegetables.  Biometrics:  Pre Biometrics - 02/17/21 1558       Pre Biometrics   Height  (1.676 m)    Weight 66.2 kg    Waist Circumference 36.5 inches    Hip Circumference 40.5 inches    Waist to Hip Ratio 0.9 %    BMI (Calculated) 23.57    Triceps Skinfold 21 mm    % Body Fat 35.5 %    Grip  Strength 24.9 kg    Flexibility 0 in    Single Leg Stand 0 seconds              Nutrition Therapy Plan and Nutrition Goals:   Nutrition Assessments:  Nutrition Assessments - 02/17/21 1259       MEDFICTS Scores   Pre Score 6            MEDIFICTS Score Key: ?70 Need to make dietary changes  40-70 Heart Healthy Diet ? 40 Therapeutic Level Cholesterol Diet   Picture Your Plate Scores: <16 Unhealthy dietary pattern with much room for improvement. 41-50 Dietary pattern unlikely to meet recommendations for good health and room for improvement. 51-60 More healthful dietary pattern, with some room for improvement.  >60 Healthy dietary pattern, although there may be some specific behaviors that could be improved.    Nutrition Goals Re-Evaluation:   Nutrition Goals Discharge (Final Nutrition Goals Re-Evaluation):   Psychosocial: Target Goals: Acknowledge presence or absence of significant depression and/or stress, maximize coping skills, provide positive support system. Participant is able to verbalize types and ability to use techniques and skills needed for reducing stress and depression.  Initial Review & Psychosocial Screening:  Initial Psych Review & Screening - 02/17/21 1255       Initial Review   Current issues with Current Depression;Current Anxiety/Panic;Current Psychotropic Meds;Current Stress Concerns    Source of Stress Concerns Chronic Illness      Family Dynamics   Good Support System? Yes    Comments Her son, Reuel Boom, and her husband are her support system.  Barriers   Psychosocial barriers to participate in program The patient should benefit from training in stress management and relaxation.      Screening Interventions   Interventions Encouraged to exercise;Provide feedback about the scores to participant    Expected Outcomes Short Term goal: Utilizing psychosocial counselor, staff and physician to assist with identification of specific  Stressors or current issues interfering with healing process. Setting desired goal for each stressor or current issue identified.;Long Term Goal: Stressors or current issues are controlled or eliminated.;Short Term goal: Identification and review with participant of any Quality of Life or Depression concerns found by scoring the questionnaire.;Long Term goal: The participant improves quality of Life and PHQ9 Scores as seen by post scores and/or verbalization of changes             Quality of Life Scores:  Quality of Life - 02/17/21 1259       Quality of Life   Select Quality of Life      Quality of Life Scores   Health/Function Pre 18.38 %    Socioeconomic Pre 22.92 %    Psych/Spiritual Pre 20.07 %    Family Pre 26.4 %    GLOBAL Pre 18.38 %            Scores of 19 and below usually indicate a poorer quality of life in these areas.  A difference of  2-3 points is a clinically meaningful difference.  A difference of 2-3 points in the total score of the Quality of Life Index has been associated with significant improvement in overall quality of life, self-image, physical symptoms, and general health in studies assessing change in quality of life.   PHQ-9: Recent Review Flowsheet Data     Depression screen [facility] 2/9 02/17/2021 07/28/2016 06/15/2016 06/15/2016   Decreased Interest 0 Down, Depressed, Hopeless PHQ - 2 Score Altered sleeping 1 0 1 -   Tired, decreased energy -   Change in appetite 3 0 0 -   Feeling bad or failure about yourself  1 2 0 -   Trouble concentrating -   Moving slowly or fidgety/restless -   Suicidal thoughts 0 0 0 -   PHQ-9 Score -   Difficult doing work/chores Somewhat difficult - - -      Interpretation of Total Score  Total Score Depression Severity:  1-4 = Minimal depression, 5-9 = Mild depression, 10-14 = Moderate depression, 15-19 = Moderately severe depression, 20-27 = Severe depression    Psychosocial Evaluation and Intervention:  Psychosocial Evaluation - 02/17/21 1600       Psychosocial Evaluation & Interventions   Interventions Stress management education;Relaxation education;Encouraged to exercise with the program and follow exercise prescription    Comments Patient has no barriers to completing pulmonary rehab. She has several psychosocial issues that include depression, anxiety, bi polar disorder, and schizophrenia. She is prescribed geodon for her bi polar disorder and schizophrenia. She reports that this medication works well for her. She takes Trintellix for depression, but she feels like it is not working. She sees a [facility] manager and a psychiatrist. She scored a 13 on her PHQ-9 and reports that it is so high because she feels like Trintellix is no longer working. She reports that she has a good support system with her son and her husband. Her goals while in  the program are to be able to walk longer distances without becomining short of breath. She is eager to start the program.    Expected Outcomes The patient's psychosocial issues will be controlled.    Continue Psychosocial Services  No Follow up required             Psychosocial Re-Evaluation:  Psychosocial Re-Evaluation     Row Name 03/13/21 1017             Psychosocial Re-Evaluation   Current issues with Current Depression;Current Anxiety/Panic;Current Psychotropic Meds;Current Stress Concerns       Comments Patient is new to the program completing 4 sessions. She continues to have no psychosocial barriers identified. She is being treated for schizophrenia and bipolar disorder with Geodon and is followed by behavorial health routinely. Her anxiety is managed with Hydroxyzine and her depression is managed with Trintellix. She feels her psychosocial issues are controlled and she has a good support system. She seems to enjoy coming to class and demnostrates an interest in improving her health. We will continue  to monitor.       Expected Outcomes Patient will continue to have no psychosocial barriers identifed and her psychosocial issues will continue to be managed with medication and behavioral health monitoring.       Interventions Encouraged to attend Pulmonary Rehabilitation for the exercise;Relaxation education;Stress management education       Continue Psychosocial Services  No Follow up required               Initial Review   Source of Stress Concerns Chronic Illness                Psychosocial Discharge (Final Psychosocial Re-Evaluation):  Psychosocial Re-Evaluation - 03/13/21 1017       Psychosocial Re-Evaluation   Current issues with Current Depression;Current Anxiety/Panic;Current Psychotropic Meds;Current Stress Concerns    Comments Patient is new to the program completing 4 sessions. She continues to have no psychosocial barriers identified. She is being treated for schizophrenia and bipolar disorder with Geodon and is followed by behavorial health routinely. Her anxiety is managed with Hydroxyzine and her depression is managed with Trintellix. She feels her psychosocial issues are controlled and she has a good support system. She seems to enjoy coming to class and demnostrates an interest in improving her health. We will continue to monitor.    Expected Outcomes Patient will continue to have no psychosocial barriers identifed and her psychosocial issues will continue to be managed with medication and behavioral health monitoring.    Interventions Encouraged to attend Pulmonary Rehabilitation for the exercise;Relaxation education;Stress management education    Continue Psychosocial Services  No Follow up required      Initial Review   Source of Stress Concerns Chronic Illness              Education: Education Goals: Education classes will be provided on a weekly basis, covering required topics. Participant will state understanding/return demonstration of topics  presented.  Learning Barriers/Preferences:  Learning Barriers/Preferences - 02/17/21 1301       Learning Barriers/Preferences   Learning Barriers None    Learning Preferences Individual Instruction;Skilled Demonstration             Education Topics: How Lungs Work and Diseases: - Discuss the anatomy of the lungs and diseases that can affect the lungs, such as COPD.   Exercise: -Discuss the importance of exercise, FITT principles of exercise, normal and abnormal responses to exercise, and how to  exercise safely.   Environmental Irritants: -Discuss types of environmental irritants and how to limit exposure to environmental irritants.   Meds/Inhalers and oxygen: - Discuss respiratory medications, definition of an inhaler and oxygen, and the proper way to use an inhaler and oxygen.   Energy Saving Techniques: - Discuss methods to conserve energy and decrease shortness of breath when performing activities of daily living.    Bronchial Hygiene / Breathing Techniques: - Discuss breathing mechanics, pursed-lip breathing technique,  proper posture, effective ways to clear airways, and other functional breathing techniques   Cleaning Equipment: - Provides group verbal and written instruction about the health risks of elevated stress, cause of high stress, and healthy ways to reduce stress.   Nutrition I: Fats: - Discuss the types of cholesterol, what cholesterol does to the body, and how cholesterol levels can be controlled.   Nutrition II: Labels: -Discuss the different components of food labels and how to read food labels. Flowsheet Row PULMONARY REHAB OTHER RESPIRATORY from 03/13/2021 in [facility]  Date 02/27/21  Educator pb  Instruction Review Code 1- Verbalizes Understanding       Respiratory Infections: - Discuss the signs and symptoms of respiratory infections, ways to prevent respiratory infections, and the importance of seeking medical  treatment when having a respiratory infection. Flowsheet Row PULMONARY REHAB OTHER RESPIRATORY from 03/13/2021 in [facility]  Date 03/06/21  Educator DJ  Instruction Review Code 1- Verbalizes Understanding       Stress I: Signs and Symptoms: - Discuss the causes of stress, how stress may lead to anxiety and depression, and ways to limit stress. Flowsheet Row PULMONARY REHAB OTHER RESPIRATORY from 03/13/2021 in [facility]  Date 03/13/21  Educator pb  Instruction Review Code 1- Verbalizes Understanding       Stress II: Relaxation: -Discuss relaxation techniques to limit stress.   Oxygen for Home/Travel: - Discuss how to prepare for travel when on oxygen and proper ways to transport and store oxygen to ensure safety.   Knowledge Questionnaire Score:  Knowledge Questionnaire Score - 02/17/21 1301       Knowledge Questionnaire Score   Pre Score 14/18             Core Components/Risk Factors/Patient Goals at Admission:  Personal Goals and Risk Factors at Admission - 02/17/21 1306       Core Components/Risk Factors/Patient Goals on Admission   Admit Weight 152 lb (68.9 kg)    Goal Weight: Short Term 135 lb (61.2 kg)             Core Components/Risk Factors/Patient Goals Review:   Goals and Risk Factor Review     Row Name 03/13/21 1023             Core Components/Risk Factors/Patient Goals Review   Personal Goals Review Weight Management/Obesity;Develop more efficient breathing techniques such as purse lipped breathing and diaphragmatic breathing and practicing self-pacing with activity.;Increase knowledge of respiratory medications and ability to use respiratory devices properly.;Improve shortness of breath with ADL's       Review Patient was referred to PR with Asthma by Dr. Karna Christmas. She is new to the program completing 4 sessions. She exercises on RA saturating at 64-96% during exercise. Her blood pressure is well  controlled.  Her personal goals for the program are to walk longer distances without SOB. We will continue to monitor her progress as she works towards meeting these goals.       Expected  Outcomes Patient will complete the program meeting both program and personal goals.                Core Components/Risk Factors/Patient Goals at Discharge (Final Review):   Goals and Risk Factor Review - 03/13/21 1023       Core Components/Risk Factors/Patient Goals Review   Personal Goals Review Weight Management/Obesity;Develop more efficient breathing techniques such as purse lipped breathing and diaphragmatic breathing and practicing self-pacing with activity.;Increase knowledge of respiratory medications and ability to use respiratory devices properly.;Improve shortness of breath with ADL's    Review Patient was referred to PR with Asthma by Dr. Karna Christmas. She is new to the program completing 4 sessions. She exercises on RA saturating at 64-96% during exercise. Her blood pressure is well controlled.  Her personal goals for the program are to walk longer distances without SOB. We will continue to monitor her progress as she works towards meeting these goals.    Expected Outcomes Patient will complete the program meeting both program and personal goals.             ITP Comments:   Comments: ITP REVIEW Pt is making expected progress toward pulmonary rehab goals after completing 7 sessions. Recommend continued exercise, life style modification, education, and utilization of breathing techniques to increase stamina and strength and decrease shortness of breath with exertion.

## 2021-03-20 ENCOUNTER — Encounter (HOSPITAL_COMMUNITY)
Admission: RE | Admit: 2021-03-20 | Discharge: 2021-03-20 | Disposition: A | Discharge status: Home / Self Care | Payer: Medicare Other | Source: Ambulatory Visit | Attending: Pulmonary Disease | Admitting: Pulmonary Disease

## 2021-03-20 ENCOUNTER — Other Ambulatory Visit: Payer: Self-pay

## 2021-03-20 DIAGNOSIS — J45909 Unspecified asthma, uncomplicated: Secondary | ICD-10-CM

## 2021-03-20 NOTE — Progress Notes (Signed)
Daily Session Note  Patient Details  Name: EVIAN DERRINGER MRN: [id number] Date of Birth: 02/03/1956 Referring Provider:   Flowsheet Row PULMONARY REHAB OTHER RESP ORIENTATION from 02/17/2021 in [place]  Referring Provider Dr. Lanney Gins       Encounter Date: 03/20/2021  Check In:  Session Check In - 03/20/21 1330       Check-In   Supervising physician immediately available to respond to emergencies [facility] MD immediately available    Physician(s) Dr. Domenic Polite    Location [facility]    Staff Present Geanie Cooley, RN;Dalton Kris Mouton, MS, ACSM-CEP, Exercise Physiologist;Other    Virtual Visit No    Medication changes reported     No    Fall or balance concerns reported    No    Tobacco Cessation No Change    Warm-up and Cool-down Performed as group-led instruction    Resistance Training Performed Yes    VAD Patient? No    PAD/SET Patient? No      Pain Assessment   Currently in Pain? Yes    Pain Score 6     Pain Location Back    Pain Orientation Lower    Pain Descriptors / Indicators Constant    Pain Type Chronic pain    Pain Onset More than a month ago    Pain Frequency Constant    Aggravating Factors  WB    Pain Relieving Factors Percocet    Multiple Pain Sites No             Capillary Blood Glucose: No results found for this or any previous visit (from the past 24 hour(s)).    Social History   Tobacco Use  Smoking Status Former   Packs/day: 4.00   Types: Cigarettes   Quit date: 07/06/2008   Years since quitting: 12.7  Smokeless Tobacco Never    Goals Met:  Independence with exercise equipment Exercise tolerated well No report of concerns or symptoms today Strength training completed today  Goals Unmet:  Not Applicable  Comments: check out 1430   Dr. Kathie Dike is Medical Director for [facility].

## 2021-03-25 ENCOUNTER — Encounter (HOSPITAL_COMMUNITY)
Admission: RE | Admit: 2021-03-25 | Discharge: 2021-03-25 | Disposition: A | Discharge status: Home / Self Care | Payer: Medicare Other | Source: Ambulatory Visit | Attending: Pulmonary Disease | Admitting: Pulmonary Disease

## 2021-03-25 ENCOUNTER — Other Ambulatory Visit: Payer: Self-pay

## 2021-03-25 DIAGNOSIS — J45909 Unspecified asthma, uncomplicated: Secondary | ICD-10-CM | POA: Diagnosis not present

## 2021-03-25 NOTE — Progress Notes (Signed)
I have reviewed a Home Exercise Prescription with Kristen Ryan . [name] is currently exercising at home.  The patient was advised to walk 3 days a week for 30-45 minutes.  Libia and I discussed how to progress their exercise prescription.  The patient stated that their goals were to start back to running again.  The patient stated that they understand the exercise prescription.  We reviewed exercise guidelines, target heart rate during exercise, RPE Scale, weather conditions, NTG use, endpoints for exercise, warmup and cool down.  Patient is encouraged to come to me with any questions. I will continue to follow up with the patient to assist them with progression and safety.

## 2021-03-25 NOTE — Progress Notes (Signed)
Daily Session Note  Patient Details  Name: Kristen Ryan MRN: [id number] Date of Birth: 1955-07-31 Referring Provider:   Flowsheet Row PULMONARY REHAB OTHER RESP ORIENTATION from 02/17/2021 in [place]  Referring Provider Dr. Lanney Gins       Encounter Date: 03/25/2021  Check In:  Session Check In - 03/25/21 1330       Check-In   Supervising physician immediately available to respond to emergencies [facility] MD immediately available    Physician(s) Dr. Domenic Polite    Location [facility]    Staff Present Geanie Cooley, RN;Heather Otho Ket, BS, Exercise Physiologist;Dalton Kris Mouton, MS, ACSM-CEP, Exercise Physiologist    Virtual Visit No    Medication changes reported     No    Fall or balance concerns reported    No    Comments using a cane for balance    Tobacco Cessation No Change    Warm-up and Cool-down Performed as group-led instruction    Resistance Training Performed Yes    VAD Patient? No    PAD/SET Patient? No      Pain Assessment   Currently in Pain? Yes    Pain Score 7     Pain Location Knee    Pain Orientation Left    Pain Descriptors / Indicators Constant    Pain Type Chronic pain    Pain Onset More than a month ago    Pain Frequency Constant    Multiple Pain Sites No             Capillary Blood Glucose: No results found for this or any previous visit (from the past 24 hour(s)).    Social History   Tobacco Use  Smoking Status Former   Packs/day: 4.00   Types: Cigarettes   Quit date: 07/06/2008   Years since quitting: 12.7  Smokeless Tobacco Never    Goals Met:  Proper associated with RPD/PD & O2 Sat Independence with exercise equipment Using PLB without cueing & demonstrates good technique Exercise tolerated well No report of concerns or symptoms today Strength training completed today  Goals Unmet:  Not Applicable  Comments: check out @ 2:30pm   Dr. Kathie Dike is Medical Director  for [facility].

## 2021-03-27 ENCOUNTER — Encounter (HOSPITAL_COMMUNITY)
Admission: RE | Admit: 2021-03-27 | Discharge: 2021-03-27 | Disposition: A | Discharge status: Home / Self Care | Payer: Medicare Other | Source: Ambulatory Visit | Attending: Pulmonary Disease | Admitting: Pulmonary Disease

## 2021-03-27 ENCOUNTER — Ambulatory Visit (HOSPITAL_BASED_OUTPATIENT_CLINIC_OR_DEPARTMENT_OTHER): Payer: Medicare Other | Admitting: Licensed Clinical Social Worker

## 2021-03-27 ENCOUNTER — Other Ambulatory Visit: Payer: Self-pay

## 2021-03-27 DIAGNOSIS — F3175 Bipolar disorder, in partial remission, most recent episode depressed: Secondary | ICD-10-CM

## 2021-03-27 DIAGNOSIS — J45909 Unspecified asthma, uncomplicated: Secondary | ICD-10-CM | POA: Diagnosis not present

## 2021-03-27 NOTE — Plan of Care (Signed)
  Problem: Alleviate depressive/manic symptoms and return to improved levels of effective functioning. Goal: LTG: Stabilize mood and increase goal-directed behavior: Input needed on appropriate metric Outcome: Not Progressing Goal: STG: @PREFFIRSTNAME @ will attend at least 80% of scheduled follow-up medication management appointments Outcome: Not Progressing Intervention: Review PLEASE Skills (Treat Physical Illness, Balance Eating, Avoid Mood-Altering Substances, Balance Sleep and Get Exercise) with @PREFFIRSTNAME @ Intervention: Monitor for signs of escalating behavior Intervention: Assess emotional status and coping mechanisms

## 2021-03-27 NOTE — Progress Notes (Signed)
Daily Session Note  Patient Details  Name: Kristen Ryan MRN: [id number] Date of Birth: [birth year] Referring Provider:   Flowsheet Row PULMONARY REHAB OTHER RESP ORIENTATION from 02/17/2021 in [place]  Referring Provider Dr. Lanney Gins       Encounter Date: 03/27/2021  Check In:  Session Check In - 03/27/21 1330       Check-In   Supervising physician immediately available to respond to emergencies [facility] MD immediately available    Physician(s) Dr. Harl Bowie    Location [facility]    Staff Present Geanie Cooley, RN;Heather Otho Ket, BS, Exercise Physiologist;Jobani Sabado Wynetta Emery, RN, BSN    Virtual Visit No    Medication changes reported     No    Fall or balance concerns reported    Yes    Comments using a cane for balance    Tobacco Cessation No Change    Warm-up and Cool-down Performed as group-led instruction    Resistance Training Performed Yes    PAD/SET Patient? No      Pain Assessment   Currently in Pain? No/denies    Pain Score 6     Pain Location Knee    Pain Orientation Left    Pain Descriptors / Indicators Constant    Pain Type Chronic pain    Pain Onset More than a month ago    Pain Frequency Constant    Aggravating Factors  Weight bearing    Pain Relieving Factors Perocet    Multiple Pain Sites No             Capillary Blood Glucose: No results found for this or any previous visit (from the past 24 hour(s)).    Social History   Tobacco Use  Smoking Status Former   Packs/day: 4.00   Types: Cigarettes   Quit date: 07/06/2008   Years since quitting: 12.7  Smokeless Tobacco Never    Goals Met:  Proper associated with RPD/PD & O2 Sat Independence with exercise equipment Using PLB without cueing & demonstrates good technique Exercise tolerated well No report of concerns or symptoms today Strength training completed today  Goals Unmet:  Not Applicable  Comments: Check out 1430.   Dr. Kathie Dike is Medical Director for [facility].

## 2021-03-27 NOTE — Progress Notes (Signed)
Virtual Visit via Audio Note  I connected with Kristen Ryan on 03/27/21 at  9:00 AM EDT by an audio enabled telemedicine application and verified that I am speaking with the correct person using two identifiers.  Location: Patient: home Provider: remote office [place], [place])   I discussed the limitations of evaluation and management by telemedicine and the availability of in person appointments. The patient expressed understanding and agreed to proceed.   I discussed the assessment and treatment plan with the patient. The patient was provided an opportunity to ask questions and all were answered. The patient agreed with the plan and demonstrated an understanding of the instructions.   The patient was advised to call back or seek an in-person evaluation if the symptoms worsen or if the condition fails to improve as anticipated.  I provided 60 minutes of non-face-to-face time during this encounter.   Kristen Ryan R Kristen Eggert, LCSW   THERAPIST PROGRESS NOTE  Session Time: 9-10a  Participation Level: Active  Behavioral Response: NAAlertDepressed  Type of Therapy: Individual  Treatment Goals addressed: LTG: Stabilize mood and increase goal-directed behavior: Input needed on appropriate  metric  STG: @PREFFIRSTNAME @ will attend at least 80% of scheduled follow-up medication management  appointments  Interventions:   Monitor for signs of escalating  behavior  Assess emotional status and coping  mechanisms  Review using the "teach-back" method (verbalize understanding of emerging concerns and available  supports)  Summary: Kristen Ryan is a 65 y.o. female who presents with continuing symptoms related to depression diagnosis.   Patient reports that overall mood is low, and that she feels that she is experiencing hypersomnia. Patient reports that primary support system is husband and son. Patient reports that she feels that her husband is getting tired of her complaining  of her mood.  Patient reports that she is compliant with her medications. patient reports that she has not had a psychiatric medication management appointment since June.   Encouraged patient to reach out to schedule psychiatric appointment.   Patient reports that she is currently engaging in physical therapy two times a week, and feels that this is a good social outlet. Patient reports that there are other patients that go in around the same time as her for physical therapy, and she often will have conversations with them before her sessions and after her sessions.  Patient reports that she is trying to stay in a solid daily structure, and frequently will get up around 4:00 AM, and will go to bed around 7:30 to 8:00 o'clock PM.  Continued recommendations are as follows: self care behaviors, positive social engagements, focusing on overall work/home/life balance, and focusing on positive physical and emotional wellness. .   Suicidal/Homicidal: No  Therapist Response: Progress fluctuating/intermittent. Refer to psychiatrist for medication management.   Plan: Return again in 4 weeks.  Diagnosis: Axis I: Bipolar, Depressed    Axis II: No diagnosis    Kristen [name], LCSW 03/27/2021

## 2021-04-01 ENCOUNTER — Encounter (HOSPITAL_COMMUNITY): Payer: Medicare Other

## 2021-04-03 ENCOUNTER — Encounter (HOSPITAL_COMMUNITY): Payer: Medicare Other

## 2021-04-08 ENCOUNTER — Encounter (HOSPITAL_COMMUNITY): Payer: Medicare Other

## 2021-04-10 ENCOUNTER — Encounter (HOSPITAL_COMMUNITY): Payer: Medicare Other

## 2021-04-15 ENCOUNTER — Encounter (HOSPITAL_COMMUNITY): Payer: Medicare Other

## 2021-04-15 NOTE — Progress Notes (Signed)
Discharge Progress Report  Patient Details  Name: Kristen Ryan MRN: [id number] Date of Birth: Aug 14, 1955 Referring Provider:   Flowsheet Row PULMONARY REHAB OTHER RESP ORIENTATION from 02/17/2021 in [place]  Referring Provider Dr. Lanney Gins        Number of Visits: 10  Reason for Discharge:  Early Exit:  Personal  Smoking History:  Social History   Tobacco Use  Smoking Status Former   Packs/day: 4.00   Types: Cigarettes   Quit date: 07/06/2008   Years since quitting: 12.7  Smokeless Tobacco Never    Diagnosis:  Uncomplicated asthma, unspecified asthma severity, unspecified whether persistent  ADL UCSD:  Pulmonary Assessment Scores     Row Name 02/17/21 1254         ADL UCSD   ADL Phase Entry     SOB Score total 51           CAT Score   CAT Score 15           mMRC Score   mMRC Score 3              Initial Exercise Prescription:  Initial Exercise Prescription - 02/17/21 1500       Date of Initial Exercise RX and Referring Provider   Date 02/17/21    Referring Provider Dr. Lanney Gins    Expected Discharge Date 06/24/21      Oxygen   Oxygen Continuous    Liters 2      Treadmill   MPH 1    Grade 0    Minutes 17      NuStep   Level 1    SPM 60    Minutes 22      Prescription Details   Frequency (times per week) 2    Duration Progress to 30 minutes of continuous aerobic without signs/symptoms of physical distress      Intensity   THRR 40-80% of Max Heartrate 62-124    Ratings of Perceived Exertion 11-13    Perceived Dyspnea 0-4      Resistance Training   Training Prescription Yes    Weight 2 lbs    Reps 10-15             Discharge Exercise Prescription (Final Exercise Prescription Changes):  Exercise Prescription Changes - 03/27/21 1330       Response to Exercise   Blood Pressure (Admit) 109/58    Blood Pressure (Exercise) 128/68    Blood Pressure (Exit) 104/70    Heart Rate (Admit) 62 bpm     Heart Rate (Exercise) 78 bpm    Heart Rate (Exit) 63 bpm    Oxygen Saturation (Admit) 90 %    Oxygen Saturation (Exercise) 91 %    Oxygen Saturation (Exit) 91 %    Rating of Perceived Exertion (Exercise) 13    Perceived Dyspnea (Exercise) 13    Duration Continue with 30 min of aerobic exercise without signs/symptoms of physical distress.    Intensity THRR unchanged      Progression   Progression Continue to progress workloads to maintain intensity without signs/symptoms of physical distress.      Resistance Training   Training Prescription Yes    Weight 2    Reps 10-15    Time 10 Minutes      Treadmill   MPH 1.6    Grade 0    Minutes 17    METs 2.22      Arm Ergometer   Level 1  Minutes 22    METs 2.4             Functional Capacity:  6 Minute Walk     Row Name 02/17/21 1553         6 Minute Walk   Phase Initial     Distance 800 feet     Walk Time 6 minutes     # of Rest Breaks 1     MPH 1.52     METS 2.52     RPE 12     Perceived Dyspnea  11     VO2 Peak 8.81     Symptoms Yes (comment)     Comments 1 minute standing break due to desaturation and to get O2 placed on via nasual cannula. Back pain 8/10     Resting HR 65 bpm     Resting BP 102/60     Resting Oxygen Saturation  91 %     Exercise Oxygen Saturation  during 6 min walk 81 %     Max Ex. HR 99 bpm     Max Ex. BP 134/70     2 Minute Post BP 110/62           Interval HR   1 Minute HR 75     2 Minute HR 79     3 Minute HR 81     4 Minute HR 89     5 Minute HR 91     6 Minute HR 99     2 Minute Post HR 79     Interval Heart Rate? Yes           Interval Oxygen   Interval Oxygen? Yes     Baseline Oxygen Saturation % 91 %     1 Minute Oxygen Saturation % 88 %     1 Minute Liters of Oxygen 0 L     2 Minute Oxygen Saturation % 81 %     2 Minute Liters of Oxygen 0 L     3 Minute Oxygen Saturation % 95 %     3 Minute Liters of Oxygen 2 L     4 Minute Oxygen Saturation % 98 %     4  Minute Liters of Oxygen 2 L     5 Minute Oxygen Saturation % 100 %     5 Minute Liters of Oxygen 2 L     6 Minute Oxygen Saturation % 96 %     6 Minute Liters of Oxygen 2 L     2 Minute Post Oxygen Saturation % 99 %     2 Minute Post Liters of Oxygen 2 L              Psychological, QOL, Others - Outcomes: PHQ 2/9: Depression screen [facility] 2/9 02/17/2021 07/28/2016 06/15/2016 06/15/2016  Decreased Interest 0 3 3 3   Down, Depressed, Hopeless 3 3 3 3   PHQ - 2 Score 3 6 6 6   Altered sleeping 1 0 1 -  Tired, decreased energy 3 1 3  -  Change in appetite 3 0 0 -  Feeling bad or failure about yourself  1 2 0 -  Trouble concentrating 1 2 1  -  Moving slowly or fidgety/restless 1 3 3  -  Suicidal thoughts 0 0 0 -  PHQ-9 Score 13 14 14  -  Difficult doing work/chores Somewhat difficult - - -  Some encounter information is confidential and restricted. Go to Review Flowsheets activity to  see all data.  Some recent data might be hidden    Quality of Life:  Quality of Life - 02/17/21 1259       Quality of Life   Select Quality of Life      Quality of Life Scores   Health/Function Pre 18.38 %    Socioeconomic Pre 22.92 %    Psych/Spiritual Pre 20.07 %    Family Pre 26.4 %    GLOBAL Pre 18.38 %             Personal Goals: Goals established at orientation with interventions provided to work toward goal.  Personal Goals and Risk Factors at Admission - 02/17/21 1306       Core Components/Risk Factors/Patient Goals on Admission   Admit Weight 152 lb (68.9 kg)    Goal Weight: Short Term 135 lb (61.2 kg)              Personal Goals Discharge:  Goals and Risk Factor Review     Row Name 03/13/21 1023 04/09/21 1251           Core Components/Risk Factors/Patient Goals Review   Personal Goals Review Weight Management/Obesity;Develop more efficient breathing techniques such as purse lipped breathing and diaphragmatic breathing and practicing self-pacing with activity.;Increase  knowledge of respiratory medications and ability to use respiratory devices properly.;Improve shortness of breath with ADL's Weight Management/Obesity;Develop more efficient breathing techniques such as purse lipped breathing and diaphragmatic breathing and practicing self-pacing with activity.;Increase knowledge of respiratory medications and ability to use respiratory devices properly.;Improve shortness of breath with ADL's      Review Patient was referred to [facility] with Asthma by Dr. Lanney Gins. She is new to the program completing 4 sessions. She exercises on RA saturating at 64-96% during exercise. Her blood pressure is well controlled.  Her personal goals for the program are to walk longer distances without SOB. We will continue to monitor her progress as she works towards meeting these goals. Patient has completed 9 sessions. She is doing well in the program with consistent attendance and progressions. She has been out for the past 2 weeks due to a death in her family. She continues to exercise on RA saturating at 90-94% during exercise. Her blood pressure is well controlled.  Her personal goals for the program continue to be to walk longer distances without SOB. We will continue to monitor her progress as she works towards meeting these goals.      Expected Outcomes Patient will complete the program meeting both program and personal goals. Patient will complete the program meeting both program and personal goals.               Exercise Goals and Review:  Exercise Goals     Row Name 02/17/21 1557 03/18/21 1637           Exercise Goals   Increase Physical Activity Yes Yes      Intervention Provide advice, education, support and counseling about physical activity/exercise needs.;Develop an individualized exercise prescription for aerobic and resistive training based on initial evaluation findings, risk stratification, comorbidities and participant's personal goals. Provide advice, education,  support and counseling about physical activity/exercise needs.;Develop an individualized exercise prescription for aerobic and resistive training based on initial evaluation findings, risk stratification, comorbidities and participant's personal goals.      Expected Outcomes Short Term: Attend rehab on a regular basis to increase amount of physical activity.;Long Term: Add in home exercise to make exercise part of routine and  to increase amount of physical activity.;Long Term: Exercising regularly at least 3-5 days a week. Short Term: Attend rehab on a regular basis to increase amount of physical activity.;Long Term: Add in home exercise to make exercise part of routine and to increase amount of physical activity.;Long Term: Exercising regularly at least 3-5 days a week.      Increase Strength and Stamina Yes Yes      Intervention Provide advice, education, support and counseling about physical activity/exercise needs.;Develop an individualized exercise prescription for aerobic and resistive training based on initial evaluation findings, risk stratification, comorbidities and participant's personal goals. Provide advice, education, support and counseling about physical activity/exercise needs.;Develop an individualized exercise prescription for aerobic and resistive training based on initial evaluation findings, risk stratification, comorbidities and participant's personal goals.      Expected Outcomes Short Term: Increase workloads from initial exercise prescription for resistance, speed, and METs.;Short Term: Perform resistance training exercises routinely during rehab and add in resistance training at home;Long Term: Improve cardiorespiratory fitness, muscular endurance and strength as measured by increased METs and functional capacity (6MWT) Short Term: Increase workloads from initial exercise prescription for resistance, speed, and METs.;Short Term: Perform resistance training exercises routinely during  rehab and add in resistance training at home;Long Term: Improve cardiorespiratory fitness, muscular endurance and strength as measured by increased METs and functional capacity (6MWT)      Able to understand and use rate of perceived exertion (RPE) scale Yes Yes      Intervention Provide education and explanation on how to use RPE scale Provide education and explanation on how to use RPE scale      Expected Outcomes Short Term: Able to use RPE daily in rehab to express subjective intensity level;Long Term:  Able to use RPE to guide intensity level when exercising independently Short Term: Able to use RPE daily in rehab to express subjective intensity level;Long Term:  Able to use RPE to guide intensity level when exercising independently      Able to understand and use Dyspnea scale Yes Yes      Intervention Provide education and explanation on how to use Dyspnea scale Provide education and explanation on how to use Dyspnea scale      Expected Outcomes Short Term: Able to use Dyspnea scale daily in rehab to express subjective sense of shortness of breath during exertion;Long Term: Able to use Dyspnea scale to guide intensity level when exercising independently Short Term: Able to use Dyspnea scale daily in rehab to express subjective sense of shortness of breath during exertion;Long Term: Able to use Dyspnea scale to guide intensity level when exercising independently      Knowledge and understanding of Target Heart Rate Range (THRR) Yes Yes      Intervention Provide education and explanation of THRR including how the numbers were predicted and where they are located for reference Provide education and explanation of THRR including how the numbers were predicted and where they are located for reference      Expected Outcomes Short Term: Able to state/look up THRR;Long Term: Able to use THRR to govern intensity when exercising independently;Short Term: Able to use daily as guideline for intensity in rehab  Short Term: Able to state/look up THRR;Long Term: Able to use THRR to govern intensity when exercising independently;Short Term: Able to use daily as guideline for intensity in rehab      Understanding of Exercise Prescription Yes Yes      Intervention Provide education, explanation, and written materials  on patient's individual exercise prescription Provide education, explanation, and written materials on patient's individual exercise prescription      Expected Outcomes Short Term: Able to explain program exercise prescription;Long Term: Able to explain home exercise prescription to exercise independently Short Term: Able to explain program exercise prescription;Long Term: Able to explain home exercise prescription to exercise independently               Exercise Goals Re-Evaluation:  Exercise Goals Re-Evaluation     [place] Name 02/18/21 1247 03/18/21 1637           Exercise Goal Re-Evaluation   Exercise Goals Review Increase Physical Activity;Increase Strength and Stamina;Able to understand and use rate of perceived exertion (RPE) scale;Knowledge and understanding of Target Heart Rate Range (THRR);Able to check pulse independently;Understanding of Exercise Prescription Increase Physical Activity;Increase Strength and Stamina;Able to understand and use rate of perceived exertion (RPE) scale;Knowledge and understanding of Target Heart Rate Range (THRR);Able to check pulse independently;Understanding of Exercise Prescription      Comments Pt will begin exercise on 02/20/21. We will monitor and progress her as able. Pt has completed 6 exercise sessions. She gives good effort while here, but has been limited by her knee and back pain. She is currently exercising at 2.0 METs on the arm ergometer.      Expected Outcomes Through exercise at rehab and at home, the patient will meet their stated goals. Through exercise at rehab and at home, the patient will meet their stated goals.                Nutrition & Weight - Outcomes:  Pre Biometrics - 02/17/21 1558       Pre Biometrics   Height 5' 6"  (1.676 m)    Weight 145 lb 15.1 oz (66.2 kg)    Waist Circumference 36.5 inches    Hip Circumference 40.5 inches    Waist to Hip Ratio 0.9 %    BMI (Calculated) 23.57    Triceps Skinfold 21 mm    % Body Fat 35.5 %    Grip Strength 24.9 kg    Flexibility 0 in    Single Leg Stand 0 seconds              Nutrition:   Nutrition Discharge:  Nutrition Assessments - 02/17/21 1259       MEDFICTS Scores   Pre Score 6             Education Questionnaire Score:  Knowledge Questionnaire Score - 02/17/21 1301       Knowledge Questionnaire Score   Pre Score 14/18            Pt discharged from pulmonary rehab after 10 sessions. She called on 04/15/2021, and stated that she would be unable to continue rehab at this time due to a severe depressive episode. We encouraged her to seek out another referral in the future if she would like to start rehab again. While she was limited by back and knee pain, she gave good effort and was able to progress in the time that she was here. Her MET level at discharge was 2.4.

## 2021-04-17 ENCOUNTER — Encounter (HOSPITAL_COMMUNITY): Payer: Medicare Other

## 2021-04-22 ENCOUNTER — Encounter (HOSPITAL_COMMUNITY): Payer: Medicare Other

## 2021-04-24 ENCOUNTER — Encounter (HOSPITAL_COMMUNITY): Payer: Medicare Other

## 2021-04-29 ENCOUNTER — Encounter (HOSPITAL_COMMUNITY): Payer: Medicare Other

## 2021-04-29 ENCOUNTER — Ambulatory Visit (INDEPENDENT_AMBULATORY_CARE_PROVIDER_SITE_OTHER): Payer: Medicare Other | Admitting: Licensed Clinical Social Worker

## 2021-04-29 ENCOUNTER — Other Ambulatory Visit: Payer: Self-pay

## 2021-04-29 DIAGNOSIS — F3132 Bipolar disorder, current episode depressed, moderate: Secondary | ICD-10-CM | POA: Diagnosis not present

## 2021-04-29 NOTE — Plan of Care (Signed)
  Problem: Alleviate depressive/manic symptoms and return to improved levels of effective functioning. Goal: LTG: Stabilize mood and increase goal-directed behavior: Input needed on appropriate metric Outcome: Not Progressing Note: Pt reporting increase in depression symptoms--PHQ9 score higher than last session Goal: STG: @PREFFIRSTNAME @ will attend at least 80% of scheduled follow-up medication management appointments Outcome: Progressing Note: Pt on time for appt Intervention: Assess emotional status and coping mechanisms Intervention: REVIEW PLEASE SKILLS (TREAT PHYSICAL ILLNESS, BALANCE EATING, AVOID MOOD-ALTERING SUBSTANCES, BALANCE SLEEP AND GET EXERCISE) WITH Acquanetta Intervention: WORK WITH Zohar TO IDENTIFY THE MAJOR COMPONENTS OF A RECENT EPISODE OF DEPRESSION: PHYSICAL SYMPTOMS, MAJOR THOUGHTS AND IMAGES, AND MAJOR BEHAVIORS THEY EXPERIENCED Intervention: Provide grief and bereavement support

## 2021-04-29 NOTE — Progress Notes (Signed)
Virtual Visit via Video Note  I connected with Kristen Ryan on 04/29/21 at  9:00 AM EDT by a video enabled telemedicine application and verified that I am speaking with the correct person using two identifiers.  Location: Patient: home Provider: remote office Kristen Ryan, [place])   I discussed the limitations of evaluation and management by telemedicine and the availability of in person appointments. The patient expressed understanding and agreed to proceed.  I discussed the assessment and treatment plan with the patient. The patient was provided an opportunity to ask questions and all were answered. The patient agreed with the plan and demonstrated an understanding of the instructions.   The patient was advised to call back or seek an in-person evaluation if the symptoms worsen or if the condition fails to improve as anticipated.  I provided 45 minutes of non-face-to-face time during this encounter.   Kristen Ryan R Kristen Keng, LCSW  THERAPIST PROGRESS NOTE  Session Time: 9-  Participation Level: Active  Behavioral Response: Neat and Well GroomedAlertDepressed  Type of Therapy: Individual Therapy  Treatment Goals addressed:  Goal: LTG: Stabilize mood and increase goal-directed behavior: Input needed on appropriate metric Outcome: Not Progressing Note: Pt reporting increase in depression symptoms--PHQ9 score higher than last session  Goal: STG: @PREFFIRSTNAME @ will attend at least 80% of scheduled follow-up medication management appointments Outcome: Progressing Note: Pt on time for appt  Interventions:  rvention: Assess emotional status and coping mechanisms  Intervention: REVIEW PLEASE SKILLS (TREAT PHYSICAL ILLNESS, BALANCE EATING, AVOID MOOD-ALTERING SUBSTANCES, BALANCE SLEEP AND GET EXERCISE) WITH Kristen Ryan  Intervention: WORK WITH Kristen Ryan TO IDENTIFY THE MAJOR COMPONENTS OF A RECENT EPISODE OF DEPRESSION: PHYSICAL SYMPTOMS, MAJOR THOUGHTS AND IMAGES, AND MAJOR BEHAVIORS THEY  EXPERIENCED  Intervention: Provide grief and bereavement support  Summary: Kristen Ryan is a 65 y.o. female who presents with continuing symptoms related to depression diagnosis. Pt reports that she is compliant with medication. Pt reports that she has been intentional about trying to get out of the house more, which pt feels is indicative of progress.   Allowed pt to explore and express thoughts and feelings associated with recent life situations and external stressors. Pt reports that she still misses her father and still has a lot of unanswered questions about his death. Discussed overall health-related concerns and medical interventions that pt has scheduled. Discussed lack of appetite and pts history of anorexia.  Discussed energy levels and pt reports that she is going to try to eat healthy protein choices daily.  Continued recommendations are as follows: self care behaviors, positive social engagements, focusing on overall work/home/life balance, and focusing on positive physical and emotional wellness.   Suicidal/Homicidal: No  Therapist Response: Pt is continuing to apply interventions learned in session into daily life situations. Pt is currently on track to meet goals utilizing interventions mentioned above. Personal growth and progress noted. Treatment to continue as indicated.   Plan: Return again in 4 weeks. Pt currently seeking psychiatrist  Diagnosis: Axis I: Bipolar disorder, depressed episode    Axis II: No diagnosis    76 Kristen Zenz, LCSW 04/29/2021

## 2021-05-01 ENCOUNTER — Encounter (HOSPITAL_COMMUNITY): Payer: Medicare Other

## 2021-05-06 ENCOUNTER — Encounter (HOSPITAL_COMMUNITY): Payer: Medicare Other

## 2021-05-08 ENCOUNTER — Encounter (HOSPITAL_COMMUNITY): Payer: Medicare Other

## 2021-05-13 ENCOUNTER — Encounter (HOSPITAL_COMMUNITY): Payer: Medicare Other

## 2021-05-15 ENCOUNTER — Encounter (HOSPITAL_COMMUNITY): Payer: Medicare Other

## 2021-05-20 ENCOUNTER — Encounter (HOSPITAL_COMMUNITY): Payer: Medicare Other

## 2021-05-22 ENCOUNTER — Encounter (HOSPITAL_COMMUNITY): Payer: Medicare Other

## 2021-05-27 ENCOUNTER — Encounter (HOSPITAL_COMMUNITY): Payer: Medicare Other

## 2021-05-29 ENCOUNTER — Encounter (HOSPITAL_COMMUNITY): Payer: Medicare Other

## 2021-06-03 ENCOUNTER — Other Ambulatory Visit: Payer: Self-pay

## 2021-06-03 ENCOUNTER — Encounter (HOSPITAL_COMMUNITY): Payer: Medicare Other

## 2021-06-03 ENCOUNTER — Ambulatory Visit (INDEPENDENT_AMBULATORY_CARE_PROVIDER_SITE_OTHER): Payer: Medicare Other | Admitting: Licensed Clinical Social Worker

## 2021-06-03 DIAGNOSIS — F3132 Bipolar disorder, current episode depressed, moderate: Secondary | ICD-10-CM | POA: Diagnosis not present

## 2021-06-03 NOTE — Plan of Care (Signed)
  Problem: Alleviate depressive/manic symptoms and return to improved levels of effective functioning. Goal: LTG: Stabilize mood and increase goal-directed behavior: Input needed on appropriate metric Outcome: Not Progressing Note: Pt reports that she doesn't feel she has progressed a lot due to acute illness (pneumonia). Goal: STG: @PREFFIRSTNAME @ will attend at least 80% of scheduled follow-up medication management appointments Outcome: Progressing Note: Pt on time for appointment and engaged well throughout session

## 2021-06-03 NOTE — Progress Notes (Signed)
Virtual Visit via Audio Note  I connected with Kristen Ryan on 06/03/21 at  9:00 AM EST by an audio enabled telemedicine application and verified that I am speaking with the correct person using two identifiers.  Location: Patient: home Provider: remote office [place], [place])   I discussed the limitations of evaluation and management by telemedicine and the availability of in person appointments. The patient expressed understanding and agreed to proceed.  I discussed the assessment and treatment plan with the patient. The patient was provided an opportunity to ask questions and all were answered. The patient agreed with the plan and demonstrated an understanding of the instructions.   The patient was advised to call back or seek an in-person evaluation if the symptoms worsen or if the condition fails to improve as anticipated.  I provided 15 minutes of non-face-to-face time during this encounter.   Saraiya Kozma R London Nonaka, LCSW  THERAPIST PROGRESS NOTE  Session Time: [phone]  Participation Level: Active  Behavioral Response: Neat and Well GroomedAlertDepressed  Type of Therapy: Individual Therapy  Treatment Goals addressed:  Goal: LTG: Stabilize mood and increase goal-directed behavior: Input needed on appropriate metric Outcome: Not Progressing Note: Pt reports that she doesn't feel she has progressed a lot due to acute illness (pneumonia).  Goal: STG: @PREFFIRSTNAME @ will attend at least 80% of scheduled follow-up medication management appointments Outcome: Progressing Note:Pt on time for appointment and engaged well throughout session  Interventions:  rvention: Assess emotional status and coping mechanisms  Intervention: WORK WITH Alondra TO IDENTIFY THE MAJOR COMPONENTS OF A RECENT EPISODE OF DEPRESSION: PHYSICAL SYMPTOMS, MAJOR THOUGHTS AND IMAGES, AND MAJOR BEHAVIORS THEY EXPERIENCED  Summary: Kristen Ryan is a 65 y.o. female who presents with continuing symptoms  related to depression diagnosis. Pt reports that she is compliant with medication.   Pt reports that she has been very ill with pneuomonia recently--pt has not been eating well and has been sleeping a lot. Pt reports that she will call her PCP today because she is still running a fever.  Continued recommendations are as follows: self care behaviors, positive social engagements, focusing on overall work/home/life balance, and focusing on positive physical and emotional wellness.   Suicidal/Homicidal: No  Therapist Response: Pt has not progressed a lot due to recent acute illness. Pt to prioritize recovery then continue focus on emotional wellness. Treatment to continue as indicated.   Plan: Return again in 4 weeks. Encouraged pt to call PCP because she still has a lot of pneumonia symptoms.   Diagnosis: Axis I: Bipolar disorder, depressed episode    Axis II: No diagnosis    76 Othal Kubitz, LCSW 06/03/2021

## 2021-06-05 ENCOUNTER — Encounter (HOSPITAL_COMMUNITY): Payer: Medicare Other

## 2021-06-10 ENCOUNTER — Encounter (HOSPITAL_COMMUNITY): Payer: Medicare Other

## 2021-06-12 ENCOUNTER — Encounter (HOSPITAL_COMMUNITY): Payer: Medicare Other

## 2021-06-17 ENCOUNTER — Encounter (HOSPITAL_COMMUNITY): Payer: Medicare Other

## 2021-06-19 ENCOUNTER — Encounter (HOSPITAL_COMMUNITY): Payer: Medicare Other

## 2021-06-24 ENCOUNTER — Encounter (HOSPITAL_COMMUNITY): Payer: Medicare Other

## 2021-07-06 ENCOUNTER — Other Ambulatory Visit: Payer: Self-pay

## 2021-07-06 ENCOUNTER — Emergency Department (HOSPITAL_COMMUNITY): Payer: Medicare Other

## 2021-07-06 ENCOUNTER — Encounter (HOSPITAL_COMMUNITY): Payer: Self-pay

## 2021-07-06 ENCOUNTER — Inpatient Hospital Stay (HOSPITAL_COMMUNITY)
Admission: EM | Admit: 2021-07-06 | Discharge: 2021-07-08 | DRG: 418 | Disposition: A | Discharge status: Home / Self Care | Payer: Medicare Other | Attending: Family Medicine | Admitting: Family Medicine

## 2021-07-06 DIAGNOSIS — K5900 Constipation, unspecified: Secondary | ICD-10-CM | POA: Diagnosis not present

## 2021-07-06 DIAGNOSIS — F209 Schizophrenia, unspecified: Secondary | ICD-10-CM | POA: Diagnosis present

## 2021-07-06 DIAGNOSIS — Z23 Encounter for immunization: Secondary | ICD-10-CM

## 2021-07-06 DIAGNOSIS — F319 Bipolar disorder, unspecified: Secondary | ICD-10-CM | POA: Diagnosis present

## 2021-07-06 DIAGNOSIS — Z888 Allergy status to other drugs, medicaments and biological substances status: Secondary | ICD-10-CM | POA: Diagnosis not present

## 2021-07-06 DIAGNOSIS — Z79890 Hormone replacement therapy (postmenopausal): Secondary | ICD-10-CM | POA: Diagnosis not present

## 2021-07-06 DIAGNOSIS — Z818 Family history of other mental and behavioral disorders: Secondary | ICD-10-CM | POA: Diagnosis not present

## 2021-07-06 DIAGNOSIS — Z87891 Personal history of nicotine dependence: Secondary | ICD-10-CM | POA: Diagnosis not present

## 2021-07-06 DIAGNOSIS — Z880 Allergy status to penicillin: Secondary | ICD-10-CM

## 2021-07-06 DIAGNOSIS — Z79899 Other long term (current) drug therapy: Secondary | ICD-10-CM | POA: Diagnosis not present

## 2021-07-06 DIAGNOSIS — I10 Essential (primary) hypertension: Secondary | ICD-10-CM | POA: Diagnosis not present

## 2021-07-06 DIAGNOSIS — E039 Hypothyroidism, unspecified: Secondary | ICD-10-CM | POA: Diagnosis not present

## 2021-07-06 DIAGNOSIS — K810 Acute cholecystitis: Principal | ICD-10-CM

## 2021-07-06 DIAGNOSIS — K589 Irritable bowel syndrome without diarrhea: Secondary | ICD-10-CM | POA: Diagnosis present

## 2021-07-06 DIAGNOSIS — G629 Polyneuropathy, unspecified: Secondary | ICD-10-CM | POA: Diagnosis present

## 2021-07-06 DIAGNOSIS — G43909 Migraine, unspecified, not intractable, without status migrainosus: Secondary | ICD-10-CM | POA: Diagnosis present

## 2021-07-06 DIAGNOSIS — Z791 Long term (current) use of non-steroidal anti-inflammatories (NSAID): Secondary | ICD-10-CM | POA: Diagnosis not present

## 2021-07-06 DIAGNOSIS — Z20822 Contact with and (suspected) exposure to covid-19: Secondary | ICD-10-CM | POA: Diagnosis not present

## 2021-07-06 DIAGNOSIS — Z820 Family history of epilepsy and other diseases of the nervous system: Secondary | ICD-10-CM | POA: Diagnosis not present

## 2021-07-06 DIAGNOSIS — J449 Chronic obstructive pulmonary disease, unspecified: Secondary | ICD-10-CM | POA: Diagnosis present

## 2021-07-06 DIAGNOSIS — J4489 Other specified chronic obstructive pulmonary disease: Secondary | ICD-10-CM | POA: Diagnosis present

## 2021-07-06 DIAGNOSIS — Z885 Allergy status to narcotic agent status: Secondary | ICD-10-CM | POA: Diagnosis not present

## 2021-07-06 DIAGNOSIS — J8283 Eosinophilic asthma: Secondary | ICD-10-CM | POA: Diagnosis not present

## 2021-07-06 DIAGNOSIS — Z881 Allergy status to other antibiotic agents status: Secondary | ICD-10-CM | POA: Diagnosis not present

## 2021-07-06 DIAGNOSIS — Z7951 Long term (current) use of inhaled steroids: Secondary | ICD-10-CM

## 2021-07-06 DIAGNOSIS — R1013 Epigastric pain: Secondary | ICD-10-CM | POA: Diagnosis present

## 2021-07-06 HISTORY — DX: Acute cholecystitis: K81.0

## 2021-07-06 HISTORY — DX: Essential (primary) hypertension: I10

## 2021-07-06 LAB — BASIC METABOLIC PANEL
Anion gap: 10 (ref 5–15)
BUN: 15 mg/dL (ref 8–23)
CO2: 30 mmol/L (ref 22–32)
Calcium: 8.9 mg/dL (ref 8.9–10.3)
Chloride: 99 mmol/L (ref 98–111)
Creatinine, Ser: 0.81 mg/dL (ref 0.44–1.00)
GFR, Estimated: >60 mL/min (ref >60)
Glucose, Bld: 121 mg/dL — ABNORMAL HIGH (ref 70–99)
Potassium: 3.5 mmol/L (ref 3.5–5.1)
Sodium: 139 mmol/L (ref 135–145)

## 2021-07-06 LAB — CBC
HCT: 42.2 % (ref 36.0–46.0)
Hemoglobin: 13.7 g/dL (ref 12.0–15.0)
MCH: 30.7 pg (ref 26.0–34.0)
MCHC: 32.5 g/dL (ref 30.0–36.0)
MCV: 94.6 fL (ref 80.0–100.0)
Platelets: 193 10*3/uL (ref 150–400)
RBC: 4.46 MIL/uL (ref 3.87–5.11)
RDW: 14.3 % (ref 11.5–15.5)
WBC: 12.7 10*3/uL — ABNORMAL HIGH (ref 4.0–10.5)
nRBC: 0 % (ref 0.0–0.2)

## 2021-07-06 LAB — TROPONIN I (HIGH SENSITIVITY)
Troponin I (High Sensitivity): 4 ng/L (ref <18)
Troponin I (High Sensitivity): 4 ng/L (ref <18)

## 2021-07-06 LAB — HEPATIC FUNCTION PANEL
ALT: 44 U/L (ref 0–44)
AST: 80 U/L — ABNORMAL HIGH (ref 15–41)
Albumin: 4 g/dL (ref 3.5–5.0)
Alkaline Phosphatase: 76 U/L (ref 38–126)
Bilirubin, Direct: 0.4 mg/dL — ABNORMAL HIGH (ref 0.0–0.2)
Indirect Bilirubin: 0.4 mg/dL (ref 0.3–0.9)
Total Bilirubin: 0.8 mg/dL (ref 0.3–1.2)
Total Protein: 6.7 g/dL (ref 6.5–8.1)

## 2021-07-06 LAB — RESP PANEL BY RT-PCR (FLU A&B, COVID) ARPGX2
Influenza A by PCR: NEGATIVE
Influenza B by PCR: NEGATIVE
SARS Coronavirus 2 by RT PCR: NEGATIVE

## 2021-07-06 LAB — LIPASE, BLOOD: Lipase: 32 U/L (ref 11–51)

## 2021-07-06 LAB — LACTIC ACID, PLASMA: Lactic Acid, Venous: 1.1 mmol/L (ref 0.5–1.9)

## 2021-07-06 MED ORDER — LEVOTHYROXINE SODIUM 50 MCG PO TABS
50.0000 ug | ORAL_TABLET | Freq: Every day | ORAL | Status: DC
  Administered 2021-07-07: 05:00:00 50 ug via ORAL
  Filled 2021-07-06: qty 1

## 2021-07-06 MED ORDER — ZIPRASIDONE HCL 80 MG PO CAPS
80.0000 mg | ORAL_CAPSULE | Freq: Two times a day (BID) | ORAL | Status: DC
  Filled 2021-07-06: qty 1

## 2021-07-06 MED ORDER — ONDANSETRON HCL 4 MG/2ML IJ SOLN
4.0000 mg | INTRAMUSCULAR | Status: AC
  Administered 2021-07-06: 4 mg via INTRAVENOUS
  Filled 2021-07-06: qty 2

## 2021-07-06 MED ORDER — ZIPRASIDONE HCL 20 MG PO CAPS
ORAL_CAPSULE | ORAL | Status: AC
  Filled 2021-07-06: qty 4

## 2021-07-06 MED ORDER — CIPROFLOXACIN IN D5W 400 MG/200ML IV SOLN
400.0000 mg | Freq: Two times a day (BID) | INTRAVENOUS | Status: DC
  Administered 2021-07-07 – 2021-07-08 (×3): 400 mg via INTRAVENOUS
  Filled 2021-07-06 (×4): qty 200

## 2021-07-06 MED ORDER — HEPARIN SODIUM (PORCINE) 5000 UNIT/ML IJ SOLN
5000.0000 [IU] | Freq: Three times a day (TID) | INTRAMUSCULAR | Status: DC
  Administered 2021-07-06 – 2021-07-08 (×5): 5000 [IU] via SUBCUTANEOUS
  Filled 2021-07-06 (×5): qty 1

## 2021-07-06 MED ORDER — GABAPENTIN 300 MG PO CAPS
600.0000 mg | ORAL_CAPSULE | Freq: Two times a day (BID) | ORAL | Status: DC
  Administered 2021-07-06 – 2021-07-08 (×4): 600 mg via ORAL
  Filled 2021-07-06 (×4): qty 2

## 2021-07-06 MED ORDER — FLUTICASONE-UMECLIDIN-VILANT 100-62.5-25 MCG/ACT IN AEPB
1.0000 | INHALATION_SPRAY | Freq: Every day | RESPIRATORY_TRACT | Status: DC
  Filled 2021-07-06 (×4): qty 1

## 2021-07-06 MED ORDER — VORTIOXETINE HBR 20 MG PO TABS
20.0000 mg | ORAL_TABLET | Freq: Every day | ORAL | Status: DC
  Administered 2021-07-07: 12:00:00 20 mg via ORAL
  Filled 2021-07-06 (×5): qty 1

## 2021-07-06 MED ORDER — CIPROFLOXACIN IN D5W 400 MG/200ML IV SOLN
400.0000 mg | Freq: Once | INTRAVENOUS | Status: AC
  Administered 2021-07-06: 400 mg via INTRAVENOUS
  Filled 2021-07-06: qty 200

## 2021-07-06 MED ORDER — GABAPENTIN 600 MG PO TABS
600.0000 mg | ORAL_TABLET | Freq: Two times a day (BID) | ORAL | Status: DC
  Filled 2021-07-06 (×2): qty 1

## 2021-07-06 MED ORDER — HYDROMORPHONE HCL 1 MG/ML IJ SOLN
1.0000 mg | INTRAMUSCULAR | Status: DC | PRN
  Administered 2021-07-06 – 2021-07-08 (×6): 1 mg via INTRAVENOUS
  Filled 2021-07-06 (×7): qty 1

## 2021-07-06 MED ORDER — METRONIDAZOLE 500 MG/100ML IV SOLN
500.0000 mg | Freq: Two times a day (BID) | INTRAVENOUS | Status: DC
  Administered 2021-07-06 – 2021-07-08 (×4): 500 mg via INTRAVENOUS
  Filled 2021-07-06 (×4): qty 100

## 2021-07-06 MED ORDER — METOPROLOL TARTRATE 25 MG PO TABS
25.0000 mg | ORAL_TABLET | Freq: Every day | ORAL | Status: DC
  Administered 2021-07-07: 25 mg via ORAL
  Filled 2021-07-06: qty 1

## 2021-07-06 MED ORDER — PANTOPRAZOLE SODIUM 40 MG PO TBEC
80.0000 mg | DELAYED_RELEASE_TABLET | Freq: Every day | ORAL | Status: DC
  Administered 2021-07-07 – 2021-07-08 (×2): 80 mg via ORAL
  Filled 2021-07-06 (×2): qty 2

## 2021-07-06 MED ORDER — DEXTROSE-NACL 5-0.45 % IV SOLN: INTRAVENOUS | Status: DC

## 2021-07-06 MED ORDER — SODIUM CHLORIDE 0.9 % IV BOLUS
1000.0000 mL | Freq: Once | INTRAVENOUS | Status: AC
Start: 2021-07-06 — End: 2021-07-06
  Administered 2021-07-06: 1000 mL via INTRAVENOUS

## 2021-07-06 MED ORDER — ERENUMAB-AOOE 70 MG/ML ~~LOC~~ SOAJ: 70.0000 mg | Freq: Every day | SUBCUTANEOUS | Status: DC | PRN

## 2021-07-06 MED ORDER — ZIPRASIDONE HCL 40 MG PO CAPS
80.0000 mg | ORAL_CAPSULE | Freq: Two times a day (BID) | ORAL | Status: DC
  Administered 2021-07-06 – 2021-07-07 (×3): 80 mg via ORAL
  Filled 2021-07-06 (×4): qty 1
  Filled 2021-07-06: qty 2
  Filled 2021-07-06 (×2): qty 1

## 2021-07-06 MED ORDER — ALBUTEROL SULFATE (2.5 MG/3ML) 0.083% IN NEBU: 3.0000 mL | INHALATION_SOLUTION | Freq: Four times a day (QID) | RESPIRATORY_TRACT | Status: DC | PRN

## 2021-07-06 MED ORDER — HYOSCYAMINE SULFATE 0.125 MG SL SUBL
0.2500 mg | SUBLINGUAL_TABLET | Freq: Once | SUBLINGUAL | Status: AC
  Administered 2021-07-06: 0.25 mg via SUBLINGUAL
  Filled 2021-07-06: qty 2

## 2021-07-06 MED ORDER — HYDROMORPHONE HCL 1 MG/ML IJ SOLN
1.0000 mg | Freq: Once | INTRAMUSCULAR | Status: AC
  Administered 2021-07-06: 1 mg via INTRAVENOUS
  Filled 2021-07-06: qty 1

## 2021-07-06 MED ORDER — LISINOPRIL 5 MG PO TABS
2.5000 mg | ORAL_TABLET | Freq: Every day | ORAL | Status: DC
  Administered 2021-07-07: 2.5 mg via ORAL
  Filled 2021-07-06: qty 1

## 2021-07-06 MED ORDER — TRAZODONE HCL 50 MG PO TABS
25.0000 mg | ORAL_TABLET | Freq: Every evening | ORAL | Status: DC | PRN
  Filled 2021-07-06: qty 1

## 2021-07-06 MED ORDER — MONTELUKAST SODIUM 10 MG PO TABS
10.0000 mg | ORAL_TABLET | Freq: Every day | ORAL | Status: DC
  Administered 2021-07-06 – 2021-07-07 (×2): 10 mg via ORAL
  Filled 2021-07-06 (×2): qty 1

## 2021-07-06 MED ORDER — IOHEXOL 350 MG/ML SOLN
100.0000 mL | Freq: Once | INTRAVENOUS | Status: AC | PRN
  Administered 2021-07-06: 100 mL via INTRAVENOUS

## 2021-07-06 MED ORDER — FENTANYL CITRATE PF 50 MCG/ML IJ SOSY: 12.5000 ug | PREFILLED_SYRINGE | INTRAMUSCULAR | Status: DC | PRN
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
## 2021-07-06 NOTE — ED Triage Notes (Signed)
Pt brought to ED via [facility] for sudden onset of upper abdominal/lower mid chest pain radiates to back rated 10/10. BP 218/100. Pt also vomited.

## 2021-07-06 NOTE — ED Provider Notes (Signed)
[facility] EMERGENCY DEPARTMENT Provider Note   CSN: [id number] Arrival date & time: 07/06/21  1436     History  Chief Complaint  Patient presents with   Chest Pain    Kristen Ryan is a 66 y.o. female.   Chest Pain  Patient is a 66 year old female, prior history of hypothyroidism, history of hypertension, history of bipolar disorder, asthma, COPD, hypertension, schizophrenia and neuropathy.  On multiple different medications but complaining today of chest pain.  This pain has no prior history of cardiac disease, she also has never had any gastrointestinal illnesses of any concern.  The pain that she is pointing to is in her epigastrium, it started at 1045 exactly and has been rather persistent since that time, it is intense, nonradiating but has been associated with 2 episodes of a large amount of vomiting.  She has never had pancreatitis, never had cholecystitis, she does not have pain with eating until today when she had significant pain when she tried to eat something small.  She denies diarrhea, denies urinary symptoms, denies swelling or rashes, denies fevers or chills, denies coughing or shortness of breath.  Home Medications Prior to Admission medications   Medication Sig Start Date End Date Taking? Authorizing Provider  montelukast (SINGULAIR) 10 MG tablet montelukast 10 mg tablet  Take 1 tablet every day by oral route at bedtime for 30 days. 02/19/21  Yes [provider]  omeprazole (PRILOSEC) 40 MG capsule Take by mouth. 03/14/21  Yes [provider]  AIMOVIG 70 MG/ML SOAJ Inject into the skin. 06/18/21   [provider]  albuterol (PROVENTIL HFA;VENTOLIN HFA) 108 (90 Base) MCG/ACT inhaler Inhale 1-2 puffs into the lungs every 6 (six) hours as needed for wheezing or shortness of breath. 05/07/18   Long, Arlyss Repress, MD  aspirin 325 MG tablet Take 325 mg by mouth daily.    [provider]  budesonide (PULMICORT) 1 MG/2ML nebulizer solution  Inhale into the lungs. 03/23/19 09/03/20  [provider]  calcium-vitamin D (OSCAL WITH D) 500-200 MG-UNIT tablet Take 1 tablet by mouth 2 (two) times daily.    [provider]  diclofenac sodium (VOLTAREN) 1 % GEL Apply 4 g topically daily. 05/15/19   [provider]  dicyclomine (BENTYL) 10 MG capsule Take 1 capsule by mouth 3 (three) times daily. 12/05/18   [provider]  Dupilumab (DUPIXENT) 300 MG/2ML SOPN Inject 300 mg into the skin every 14 (fourteen) days. 10/16/20   [provider]  EPINEPHrine 0.3 mg/0.3 mL IJ SOAJ injection Inject 0.3 mg into the muscle as needed for anaphylaxis. 05/23/19   [provider]  ferrous sulfate 220 (44 Fe) MG/5ML solution Take 220 mg by mouth 2 (two) times a day.    [provider]  ferrous sulfate 325 (65 FE) MG tablet Take 325 mg by mouth 2 (two) times daily before a meal.    [provider]  gabapentin (NEURONTIN) 300 MG capsule Take 300 mg by mouth 2 (two) times daily.    [provider]  gabapentin (NEURONTIN) 600 MG tablet TAKE (1) TABLET BY MOUTH TWICE DAILY. 12/19/20   Zena Amos, MD  hydrochlorothiazide (HYDRODIURIL) 25 MG tablet Take 1 tablet by mouth daily. 05/20/20 05/20/21  [provider]  hydrOXYzine (ATARAX/VISTARIL) 25 MG tablet Take 1 tablet (25 mg total) by mouth 3 (three) times daily as needed for anxiety. 12/19/20   Zena Amos, MD  ipratropium-albuterol (DUONEB) 0.5-2.5 (3) MG/3ML SOLN ipratropium 0.5 mg-albuterol 3  mg (2.5 mg base)/3 mL nebulization soln    [provider]  levofloxacin (LEVAQUIN) 750 MG tablet levofloxacin 750 mg tablet    [provider]  levothyroxine (SYNTHROID) 50 MCG tablet Take 1 tablet by mouth daily. 12/27/18   [provider]  levothyroxine (SYNTHROID) 50 MCG tablet levothyroxine 50 mcg tablet  Take 1 tablet every day by oral route as directed for 30 days.    [provider]  lisinopril  (ZESTRIL) 2.5 MG tablet Take 2.5 mg by mouth daily. 07/01/21   [provider]  loratadine (CLARITIN) 10 MG tablet Take 10 mg by mouth daily. Patient not taking: Reported on 09/03/2020    [provider]  Magnesium 250 MG TABS Take by mouth.    [provider]  meclizine (ANTIVERT) 25 MG tablet Take 25 mg by mouth 2 (two) times daily as needed. 08/28/20   [provider]  meloxicam (MOBIC) 15 MG tablet Take 15 mg by mouth at bedtime.    [provider]  methocarbamol (ROBAXIN) 500 MG tablet Take 1 tablet (500 mg total) by mouth 3 (three) times daily. Patient not taking: Reported on 02/17/2021 12/19/20   Pauline Ausriplett, Tammy, PA-C  metoprolol tartrate (LOPRESSOR) 25 MG tablet Take 25 mg by mouth daily. 12/27/18   [provider]  metroNIDAZOLE (FLAGYL) 500 MG tablet Take 1 tablet (500 mg total) by mouth 3 (three) times daily. 09/07/19   Petrucelli, Samantha R, PA-C  montelukast (SINGULAIR) 10 MG tablet Take 10 mg by mouth at bedtime. 02/15/21   [provider]  Multiple Vitamin (MULTIVITAMIN WITH MINERALS) TABS tablet Take 1 tablet by mouth daily.    [provider]  omeprazole (PRILOSEC) 40 MG capsule Take 40 mg by mouth 2 (two) times daily before a meal. 11/18/20   [provider]  ondansetron (ZOFRAN) 4 MG tablet Take 4 mg by mouth as needed. 12/06/19   [provider]  oxyCODONE-acetaminophen (PERCOCET) 7.5-325 MG tablet Take 7.5 tablets by mouth every 6 (six) hours as needed.    [provider]  oxyCODONE-acetaminophen (PERCOCET/ROXICET) 5-325 MG tablet Take 1-2 tablets by mouth every 6 (six) hours as needed for severe pain. Patient not taking: Reported on 02/17/2021 09/07/19   Petrucelli, Pleas KochSamantha R, PA-C  promethazine (PHENERGAN) 6.25 MG/5ML syrup promethazine 6.25 mg/5 mL oral syrup    [provider]  rosuvastatin (CRESTOR) 20 MG tablet Take 20 mg by mouth at bedtime. Patient not taking: Reported on  02/17/2021 05/07/20   [provider]  sodium chloride (OCEAN) 0.65 % SOLN nasal spray Place 1 spray into both nostrils as needed for congestion.    [provider]  Dwyane LuoRELEGY ELLIPTA 100-62.5-25 MCG/INH AEPB  08/16/18   [provider]  triamcinolone ointment (KENALOG) 0.1 % Apply 1 application topically daily as needed.    [provider]  vitamin E 180 MG (400 UNITS) capsule Take 400 Units by mouth at bedtime.    [provider]  vortioxetine HBr (TRINTELLIX) 20 MG TABS tablet Take 1 tablet (20 mg total) by mouth daily. 12/19/20   Zena AmosKaur, Mandeep, MD  ziprasidone (GEODON) 80 MG capsule Take 1 capsule (80 mg total) by mouth 2 (two) times daily with a meal. 12/19/20   Zena AmosKaur, Mandeep, MD      Allergies    Aminophylline, Sumatriptan, Theophylline, Codeine, Depakote [divalproex sodium], Divalproex sodium, Doxycycline, Lamictal [lamotrigine], Lithium, Magnesium-containing compounds, Nabumetone, Naproxen, Other, Penicillins, Pentazocine lactate, Risperidone, Septra ds [sulfamethoxazole-trimethoprim], Seroquel [quetiapine], Tegretol [carbamazepine], Clonidine derivatives,  Tetracycline, and Zyprexa [olanzapine]    Review of Systems   Review of Systems  Cardiovascular:  Positive for chest pain.  All other systems reviewed and are negative.  Physical Exam Updated Vital Signs BP 135/68    Pulse 66    Temp 99.3 F (37.4 C)    Resp 20    Ht 1.676 m (5\' 6" )    Wt 63.5 kg    SpO2 94%    BMI 22.60 kg/m  Physical Exam Vitals and nursing note reviewed.  Constitutional:      General: She is not in acute distress.    Appearance: She is well-developed.  HENT:     Head: Normocephalic and atraumatic.     Mouth/Throat:     Pharynx: No oropharyngeal exudate.  Eyes:     General: No scleral icterus.       Right eye: No discharge.        Left eye: No discharge.     Conjunctiva/sclera: Conjunctivae normal.     Pupils: Pupils are equal, round, and reactive to light.  Neck:      Thyroid: No thyromegaly.     Vascular: No JVD.  Cardiovascular:     Rate and Rhythm: Normal rate and regular rhythm.     Heart sounds: Normal heart sounds. No murmur heard.   No friction rub. No gallop.  Pulmonary:     Effort: Pulmonary effort is normal. No respiratory distress.     Breath sounds: Normal breath sounds. No wheezing or rales.  Abdominal:     General: Bowel sounds are normal. There is no distension.     Palpations: Abdomen is soft. There is no mass.     Tenderness: There is abdominal tenderness.     Comments: No Murphy sign, no tenderness in the lower abdomen whatsoever, extremely soft, there is some reproducible tenderness in the epigastrium.  Musculoskeletal:        General: No tenderness. Normal range of motion.     Cervical back: Normal range of motion and neck supple.  Lymphadenopathy:     Cervical: No cervical adenopathy.  Skin:    General: Skin is warm and dry.     Findings: No erythema or rash.  Neurological:     Mental Status: She is alert.     Coordination: Coordination normal.  Psychiatric:        Behavior: Behavior normal.    ED Results / Procedures / Treatments   Labs (all labs ordered are listed, but only abnormal results are displayed) Labs Reviewed  BASIC METABOLIC PANEL - Abnormal; Notable for the following components:      Result Value   Glucose, Bld 121 (*)    All other components within normal limits  CBC - Abnormal; Notable for the following components:   WBC 12.7 (*)    All other components within normal limits  HEPATIC FUNCTION PANEL - Abnormal; Notable for the following components:   AST 80 (*)    Bilirubin, Direct 0.4 (*)    All other components within normal limits  RESP PANEL BY RT-PCR (FLU A&B, COVID) ARPGX2  LIPASE, BLOOD  LACTIC ACID, PLASMA  TROPONIN I (HIGH SENSITIVITY)  TROPONIN I (HIGH SENSITIVITY)    EKG EKG performed on July 06, 2021 at 2:57 PM shows sinus rhythm, rate of 59 bpm with a normal axis, normal  intervals, normal ST segments, normal T waves, this is an unremarkable and normal EKG.  No signs of left ventricular hypertrophy  Radiology DG  Chest 2 View  Result Date: 07/06/2021 CLINICAL DATA:  Chest pain. EXAM: CHEST - 2 VIEW COMPARISON:  Chest x-ray 08/23/2015 FINDINGS: The cardiac silhouette, mediastinal and hilar contours are within normal limits and stable. The lungs are clear of an acute process. No pleural effusions or pulmonary lesions. The bony thorax is intact. Stable midthoracic compression deformities. IMPRESSION: No acute cardiopulmonary findings. Electronically Signed   By: Rudie MeyerP.  Gallerani M.D.   On: 07/06/2021 15:47   CT Angio Abd/Pel W and/or Wo Contrast  Result Date: 07/06/2021 CLINICAL DATA:  LUQ abdominal pain concern for vascular occlusion - abd pain. sudden onset of upper abdominal/lower mid chest pain radiates to back rated 10/10. BP 218/100. Pt also vomited. EXAM: CTA ABDOMEN AND PELVIS WITH CONTRAST TECHNIQUE: Multidetector CT imaging of the abdomen and pelvis was performed using the standard protocol during bolus administration of intravenous contrast. Delayed images through the abdomen were also obtained. Multiplanar reconstructed images and MIPs were obtained and reviewed to evaluate the vascular anatomy. CONTRAST:  100mL OMNIPAQUE IOHEXOL 350 MG/ML SOLN COMPARISON:  None. FINDINGS: VASCULAR Aorta: Moderate to severe atherosclerotic plaque. Normal caliber aorta without aneurysm, dissection, vasculitis or significant stenosis. Celiac: Patent without evidence of aneurysm, dissection, vasculitis or significant stenosis. SMA: Patent without evidence of aneurysm, dissection, vasculitis or significant stenosis. Renals: Both renal arteries are patent without evidence of aneurysm, dissection, vasculitis, fibromuscular dysplasia or significant stenosis. IMA: Patent without evidence of aneurysm, dissection, vasculitis or significant stenosis. Inflow: At least moderate atherosclerotic plaque.  Patent without evidence of aneurysm, dissection, vasculitis or significant stenosis. Proximal Outflow: Bilateral common femoral and visualized portions of the superficial and profunda femoral arteries are patent without evidence of aneurysm, dissection, vasculitis or significant stenosis. Veins: No obvious venous abnormality within the limitations of this arterial phase study. Review of the MIP images confirms the above findings. NON-VASCULAR Lower chest: No acute abnormality. Hepatobiliary: Fluid density 1 cm right hepatic lobe lesion likely represents a hepatic cyst. Vague hypodensity along the left hepatic lobe of unclear etiology (4:40). No focal liver abnormality. No gallstones. Hydropic gallbladder. Question gallbladder wall thickening. There is pericholecystic fluid. No biliary dilatation. Pancreas: No focal lesion. Normal pancreatic contour. No surrounding inflammatory changes. No main pancreatic ductal dilatation. Spleen: Normal in size without focal abnormality. Adrenals/Urinary Tract: No adrenal nodule bilaterally. Bilateral kidneys enhance symmetrically. There is a 2 cm left superior pole simple fluid lesion likely represents a simple renal cyst. Subcentimeter hypodensities are too small to characterize. No hydronephrosis. No hydroureter. The urinary bladder is unremarkable. Stomach/Bowel: Stomach is within normal limits. No evidence of bowel wall thickening or dilatation. Stool throughout the colon. Colonic diverticulosis. Appendix appears normal. Lymphatic: No lymphadenopathy. Reproductive: Prostate is unremarkable. Other: No intraperitoneal free fluid. No intraperitoneal free gas. No organized fluid collection. Musculoskeletal: No abdominal wall hernia or abnormality. No suspicious lytic or blastic osseous lesions. No acute displaced fracture. IMPRESSION: VASCULAR 1. No acute vascular abnormality. 2.  Aortic Atherosclerosis (ICD10-I70.0). NON-VASCULAR 1. Hydropic gallbladder with associated  pericholecystic fluid and query gallbladder wall thickening. Recommend right upper quadrant ultrasound for further evaluation. 2. Constipation. 3. Colonic diverticulosis with no acute diverticulitis. Electronically Signed   By: Tish FredericksonMorgane  Naveau M.D.   On: 07/06/2021 17:14    Procedures Procedures    Medications Ordered in ED Medications  ciprofloxacin (CIPRO) IVPB 400 mg (has no administration in time range)  metroNIDAZOLE (FLAGYL) IVPB 500 mg (has no administration in time range)  ondansetron (ZOFRAN) injection 4 mg (4 mg Intravenous Given 07/06/21 1623)  HYDROmorphone (DILAUDID) injection 1 mg (1 mg Intravenous Given 07/06/21 1623)  sodium chloride 0.9 % bolus 1,000 mL (1,000 mLs Intravenous New Bag/Given 07/06/21 1623)  hyoscyamine (LEVSIN SL) SL tablet 0.25 mg (0.25 mg Sublingual Given 07/06/21 1623)  iohexol (OMNIPAQUE) 350 MG/ML injection 100 mL (100 mLs Intravenous Contrast Given 07/06/21 1634)    ED Course/ Medical Decision Making/ A&P                           Medical Decision Making  This patient presents to the ED for concern of abdominal pain in the epigastrium, this involves an extensive number of treatment options, and is a complaint that carries with it a high risk of complications and morbidity.  The differential diagnosis includes pancreatitis, cholecystitis, vascular occlusion, would also consider peptic ulcer disease, less likely to be related to obstruction given no prior abdominal history and no reason to have adhesions   Co morbidities that complicate the patient evaluation  Hypertension, COPD   Additional history obtained:  Additional history obtained from medical record External records from outside source obtained and reviewed including reviewed prior external records from behavioral health, has some pulmonary rehab related to her COPD, no recent admissions to the hospital   Lab Tests:  I Ordered, and personally interpreted labs.  The pertinent results include:  Slight leukocytosis of 12,700, AST is at 80, other testing is unremarkable   Imaging Studies ordered:  I ordered imaging studies including CT scan of the abdomen and pelvis I independently visualized and interpreted imaging which showed hydropic gallbladder with surrounding pericholecystic fluid and a thickened gallbladder wall, this is likely consistent with cholecystitis I agree with the radiologist interpretation   Cardiac Monitoring:  The patient was maintained on a cardiac monitor.  I personally viewed and interpreted the cardiac monitored which showed an underlying rhythm of: Normal sinus rhythm   Medicines ordered and prescription drug management:  I ordered medication including hydromorphone antiemetics and IV fluids as well as antibiotic for acute cholecystitis Reevaluation of the patient after these medicines showed that the patient improved I have reviewed the patients home medicines and have made adjustments as needed   Test Considered:  Considered ultrasound however this test is not available at the facility today   Critical Interventions:  IV fluids antibiotics Identification of a potentially surgical condition Discussion with surgeon and hospitalist and admission   Consultations Obtained:  I requested consultation with the general surgeon Dr. Franky Macho who agrees with seeing the patient in the morning in consultation, request admission to the hospitalist service, discussed with Dr. Debby Bud who was agreeable to admit.  Problem List / ED Course:  Acute cholecystitis, n.p.o., antibiotics, IV fluid   Reevaluation:  After the interventions noted above, I reevaluated the patient and found that they have :improved   Social Determinants of Health:  None   Dispostion:  After consideration of the diagnostic results and the patients response to treatment, I feel that the patent would benefit from admission to the hospital for observation, n.p.o. after  midnight.           Final Clinical Impression(s) / ED Diagnoses Final diagnoses:  Acute cholecystitis    Rx / DC Orders ED Discharge Orders     None         Eber Hong, MD 07/06/21 [phone]

## 2021-07-07 ENCOUNTER — Inpatient Hospital Stay (HOSPITAL_COMMUNITY): Payer: Medicare Other

## 2021-07-07 DIAGNOSIS — K810 Acute cholecystitis: Secondary | ICD-10-CM | POA: Diagnosis not present

## 2021-07-07 LAB — CBC
HCT: 35.4 % — ABNORMAL LOW (ref 36.0–46.0)
Hemoglobin: 11.3 g/dL — ABNORMAL LOW (ref 12.0–15.0)
MCH: 30 pg (ref 26.0–34.0)
MCHC: 31.9 g/dL (ref 30.0–36.0)
MCV: 93.9 fL (ref 80.0–100.0)
Platelets: 163 10*3/uL (ref 150–400)
RBC: 3.77 MIL/uL — ABNORMAL LOW (ref 3.87–5.11)
RDW: 14.4 % (ref 11.5–15.5)
WBC: 7.2 10*3/uL (ref 4.0–10.5)
nRBC: 0 % (ref 0.0–0.2)

## 2021-07-07 LAB — HEPATIC FUNCTION PANEL
ALT: 223 U/L — ABNORMAL HIGH (ref 0–44)
AST: 260 U/L — ABNORMAL HIGH (ref 15–41)
Albumin: 3.1 g/dL — ABNORMAL LOW (ref 3.5–5.0)
Alkaline Phosphatase: 120 U/L (ref 38–126)
Bilirubin, Direct: 0.1 mg/dL (ref 0.0–0.2)
Indirect Bilirubin: 0.5 mg/dL (ref 0.3–0.9)
Total Bilirubin: 0.6 mg/dL (ref 0.3–1.2)
Total Protein: 5.6 g/dL — ABNORMAL LOW (ref 6.5–8.1)

## 2021-07-07 LAB — BASIC METABOLIC PANEL
Anion gap: 8 (ref 5–15)
BUN: 12 mg/dL (ref 8–23)
CO2: 28 mmol/L (ref 22–32)
Calcium: 8.5 mg/dL — ABNORMAL LOW (ref 8.9–10.3)
Chloride: 103 mmol/L (ref 98–111)
Creatinine, Ser: 0.77 mg/dL (ref 0.44–1.00)
GFR, Estimated: >60 mL/min (ref >60)
Glucose, Bld: 80 mg/dL (ref 70–99)
Potassium: 3.3 mmol/L — ABNORMAL LOW (ref 3.5–5.1)
Sodium: 139 mmol/L (ref 135–145)

## 2021-07-07 LAB — GLUCOSE, CAPILLARY
Glucose-Capillary: 101 mg/dL — ABNORMAL HIGH (ref 70–99)
Glucose-Capillary: 129 mg/dL — ABNORMAL HIGH (ref 70–99)
Glucose-Capillary: 96 mg/dL (ref 70–99)

## 2021-07-07 LAB — HIV ANTIBODY (ROUTINE TESTING W REFLEX): HIV Screen 4th Generation wRfx: NONREACTIVE

## 2021-07-07 MED ORDER — UMECLIDINIUM BROMIDE 62.5 MCG/ACT IN AEPB
1.0000 | INHALATION_SPRAY | Freq: Every day | RESPIRATORY_TRACT | Status: DC
  Administered 2021-07-07: 13:00:00 1 via RESPIRATORY_TRACT
  Filled 2021-07-07: qty 7

## 2021-07-07 MED ORDER — LACTULOSE 10 GM/15ML PO SOLN
30.0000 g | Freq: Once | ORAL | Status: AC
  Administered 2021-07-07: 30 g via ORAL
  Filled 2021-07-07: qty 60

## 2021-07-07 MED ORDER — POTASSIUM CHLORIDE CRYS ER 20 MEQ PO TBCR
40.0000 meq | EXTENDED_RELEASE_TABLET | ORAL | Status: AC
  Administered 2021-07-07 (×2): 40 meq via ORAL
  Filled 2021-07-07 (×2): qty 2

## 2021-07-07 MED ORDER — POLYETHYLENE GLYCOL 3350 17 G PO PACK
17.0000 g | PACK | Freq: Two times a day (BID) | ORAL | Status: DC
  Administered 2021-07-07 – 2021-07-08 (×2): 17 g via ORAL
  Filled 2021-07-07 (×3): qty 1

## 2021-07-07 MED ORDER — ONDANSETRON HCL 4 MG/2ML IJ SOLN: 4.0000 mg | Freq: Four times a day (QID) | INTRAMUSCULAR | Status: DC | PRN

## 2021-07-07 MED ORDER — FLUTICASONE FUROATE-VILANTEROL 100-25 MCG/ACT IN AEPB
1.0000 | INHALATION_SPRAY | Freq: Every day | RESPIRATORY_TRACT | Status: DC
  Administered 2021-07-07: 13:00:00 1 via RESPIRATORY_TRACT
  Filled 2021-07-07: qty 28

## 2021-07-07 MED ORDER — PNEUMOCOCCAL VAC POLYVALENT 25 MCG/0.5ML IJ INJ
0.5000 mL | INJECTION | INTRAMUSCULAR | Status: AC
  Administered 2021-07-08: 0.5 mL via INTRAMUSCULAR
  Filled 2021-07-07: qty 0.5

## 2021-07-07 NOTE — H&P (View-Only) (Signed)
[facility] Consult  Reason for Consult: Acute cholecystitis Referring Physician: Roxan Hockey, MD  Chief Complaint   Chest Pain     HPI: Kristen Ryan is a 66 y.o. female with a PMH significant for HTN, COPD, asthma, bipolar disorder, and schizophrenia who presents with a 1 day history of nausea, vomiting, and epigastric/right upper quadrant abdominal pain.  She states that she has had increasing issues with her stomach for the last 2 weeks, but that this pain acutely occurred yesterday with the associated nausea and vomiting.  Her pain is in the right upper quadrant/epigastrium, and radiates to the right flank.  Her episode of emesis was bilious and after eating a piece of bread and drinking some Dr. Malachi Bonds. She denies any history of gallbladder attacks in the past.  She states that the pain is slightly improved since being in the hospital.  She is currently denying nausea and vomiting.  She states that she is hungry.  Her last bowel movement was yesterday, and it was normal for her.  She has been passing flatus.  She confirms taking aspirin 325 mg daily with her last dose on 07/06/2021.  She denies any history of abdominal surgeries.  Past Medical History:  Diagnosis Date   Anemia    Asthma    Bipolar 1 disorder (HCC)    Bronchitis    COPD (chronic obstructive pulmonary disease) (HCC)    Diverticulitis    Hypertension    IBS (irritable bowel syndrome)    Migraine    Neuropathy    Schizophrenia ([place])    Thyroid disease     Past Surgical History:  Procedure Laterality Date   ABDOMINAL HYSTERECTOMY     broke left arm     CESAREAN SECTION     COLONOSCOPY N/A 08/30/2014   Procedure: COLONOSCOPY;  Surgeon: Rogene Houston, MD;  Location: [facility];  Service: Endoscopy;  Laterality: N/A;  1200   ELBOW SURGERY     FOOT SURGERY     KNEE SURGERY     TONSILLECTOMY      Family History  Problem Relation Age of Onset   Dementia Mother    Bipolar disorder  Mother    Dementia Father    Other Son        MVA accident    Social History   Tobacco Use   Smoking status: Former    Packs/day: 4.00    Types: Cigarettes    Quit date: 07/06/2008    Years since quitting: 13.0   Smokeless tobacco: Never  Vaping Use   Vaping Use: Never used  Substance Use Topics   Alcohol use: No   Drug use: No    Medications: I have reviewed the patient's current medications.  Allergies  Allergen Reactions   Aminophylline Anaphylaxis   Sumatriptan Anaphylaxis and Other (See Comments)    Causes fainting   Theophylline Anaphylaxis   Codeine Itching and Other (See Comments)    Too strong for patient    Depakote [Divalproex Sodium] Nausea Only   Divalproex Sodium Other (See Comments)    Causes hair to fall out   Doxycycline Other (See Comments)    headache   Lamictal [Lamotrigine]     Destroys thyroid   Lithium Other (See Comments)    Adverse reaction to Thyroid    Magnesium-Containing Compounds Hives   Nabumetone Swelling   Naproxen Swelling   Other     Hair dye   Penicillins Hives and Nausea And Vomiting  Has patient had a PCN reaction causing immediate rash, facial/tongue/throat swelling, SOB or lightheadedness with hypotension: Yes Has patient had a PCN reaction causing severe rash involving mucus membranes or skin necrosis: No Has patient had a PCN reaction that required hospitalization Yes Has patient had a PCN reaction occurring within the last 10 years: No If all of the above answers are "NO", then may proceed with Cephalosporin use.    Pentazocine Lactate Other (See Comments)    Unknown reaction   Risperidone Other (See Comments)    Insomnia    Septra Ds [Sulfamethoxazole-Trimethoprim]     Pt unsure, long time ago   Seroquel [Quetiapine] Swelling   Tegretol [Carbamazepine] Nausea And Vomiting   Clonidine Derivatives     Dizziness at 0.1 mg   Tetracycline Rash    Headaches   Zyprexa [Olanzapine] Rash and Other (See Comments)     Insomnia     ROS:  Constitutional: negative for anorexia, fevers, malaise, and night sweats Respiratory: negative for cough and dyspnea on exertion Cardiovascular: negative for chest pain and dyspnea Gastrointestinal: positive for abdominal pain, negative for constipation, diarrhea, nausea, and vomiting  Blood pressure 120/62, pulse 72, temperature 98.9 F (37.2 C), temperature source Oral, resp. rate 18, height 5\' 6"  (1.676 m), weight 60.2 kg, SpO2 95 %. Physical Exam Vitals reviewed.  Constitutional:      General: She is not in acute distress.    Appearance: She is well-developed. She is not toxic-appearing.  HENT:     Head: Normocephalic and atraumatic.  Eyes:     Extraocular Movements: Extraocular movements intact.     Pupils: Pupils are equal, round, and reactive to light.  Cardiovascular:     Rate and Rhythm: Normal rate and regular rhythm.  Pulmonary:     Effort: Pulmonary effort is normal. No tachypnea or respiratory distress.     Breath sounds: Normal breath sounds.  Abdominal:     Comments: Abdomen soft, no tenderness to percussion, mild tenderness to palpation in the epigastrium and periumbilical area, negative Murphy's sign; no rigidity, guarding, rebound tenderness  Skin:    General: Skin is warm and dry.  Neurological:     General: No focal deficit present.     Mental Status: She is alert and oriented to person, place, and time.  Psychiatric:        Mood and Affect: Mood normal.        Behavior: Behavior normal.    Results: Results for orders placed or performed during the hospital encounter of 07/06/21 (from the past 48 hour(s))  Basic metabolic panel     Status: Abnormal   Collection Time: 07/06/21  3:04 PM  Result Value Ref Range   Sodium 139 135 - 145 mmol/L   Potassium 3.5 3.5 - 5.1 mmol/L   Chloride 99 98 - 111 mmol/L   CO2 30 22 - 32 mmol/L   Glucose, Bld 121 (H) 70 - 99 mg/dL    Comment: Glucose reference range applies only to samples taken after  fasting for at least 8 hours.   BUN 15 8 - 23 mg/dL   Creatinine, Ser 0.81 0.44 - 1.00 mg/dL   Calcium 8.9 8.9 - 10.3 mg/dL   GFR, Estimated >60 >60 mL/min    Comment: (NOTE) Calculated using the CKD-EPI Creatinine Equation (2021)    Anion gap 10 5 - 15    Comment: Performed at [facility], [address] [place], [place] [address]  CBC     Status:  Abnormal   Collection Time: 07/06/21  3:04 PM  Result Value Ref Range   WBC 12.7 (H) 4.0 - 10.5 K/uL   RBC 4.46 3.87 - 5.11 MIL/uL   Hemoglobin 13.7 12.0 - 15.0 g/dL   HCT 42.2 36.0 - 46.0 %   MCV 94.6 80.0 - 100.0 fL   MCH 30.7 26.0 - 34.0 pg   MCHC 32.5 30.0 - 36.0 g/dL   RDW 14.3 11.5 - 15.5 %   Platelets 193 150 - 400 K/uL   nRBC 0.0 0.0 - 0.2 %    Comment: Performed at [facility], [address], [place], [place] [address]  Troponin I (High Sensitivity)     Status: None   Collection Time: 07/06/21  3:04 PM  Result Value Ref Range   Troponin I (High Sensitivity) 4 <18 ng/L    Comment: (NOTE) Elevated high sensitivity troponin I (hsTnI) values and significant  changes across serial measurements may suggest ACS but many other  chronic and acute conditions are known to elevate hsTnI results.  Refer to the "Links" section for chest pain algorithms and additional  guidance. Performed at [facility], [address], [place], [place] [address]   Lipase, blood     Status: None   Collection Time: 07/06/21  4:16 PM  Result Value Ref Range   Lipase 32 11 - 51 U/L    Comment: Performed at [facility], [address], [place], [place] [address]  Hepatic function panel     Status: Abnormal   Collection Time: 07/06/21  4:16 PM  Result Value Ref Range   Total Protein 6.7 6.5 - 8.1 g/dL   Albumin 4.0 3.5 - 5.0 g/dL   AST 80 (H) 15 - 41 U/L   ALT 44 0 - 44 U/L   Alkaline Phosphatase 76 38 - 126 U/L   Total Bilirubin 0.8 0.3 - 1.2 mg/dL   Bilirubin, Direct 0.4 (H) 0.0 - 0.2 mg/dL   Indirect Bilirubin 0.4 0.3 - 0.9 mg/dL    Comment:  Performed at [facility], [address], [place], [place] [address]  Lactic acid, plasma     Status: None   Collection Time: 07/06/21  4:16 PM  Result Value Ref Range   Lactic Acid, Venous 1.1 0.5 - 1.9 mmol/L    Comment: Performed at [facility], [address], [place], [place] [address]  Troponin I (High Sensitivity)     Status: None   Collection Time: 07/06/21  5:15 PM  Result Value Ref Range   Troponin I (High Sensitivity) 4 <18 ng/L    Comment: (NOTE) Elevated high sensitivity troponin I (hsTnI) values and significant  changes across serial measurements may suggest ACS but many other  chronic and acute conditions are known to elevate hsTnI results.  Refer to the "Links" section for chest pain algorithms and additional  guidance. Performed at [facility], [address] [place],  [address]   Resp Panel by RT-PCR (Flu A&B, Covid) Nasopharyngeal Swab     Status: None   Collection Time: 07/06/21  6:10 PM   Specimen: Nasopharyngeal Swab; Nasopharyngeal(NP) swabs in vial transport medium  Result Value Ref Range   SARS Coronavirus 2 by RT PCR NEGATIVE NEGATIVE    Comment: (NOTE) SARS-CoV-2 target nucleic acids are NOT DETECTED.  The SARS-CoV-2 RNA is generally detectable in upper respiratory specimens during the acute phase of infection. The lowest concentration of SARS-CoV-2 viral copies this assay can detect is 138 copies/mL. A negative result does  not preclude SARS-Cov-2 infection and should not be used as the sole basis for treatment or other patient management decisions. A negative result may occur with  improper specimen collection/handling, submission of specimen other than nasopharyngeal swab, presence of viral mutation(s) within the areas targeted by this assay, and inadequate number of viral copies(<138 copies/mL). A negative result must be combined with clinical observations, patient history, and epidemiological information. The expected result is  Negative.  Fact Sheet for Patients:  EntrepreneurPulse.com.au  Fact Sheet for Healthcare Providers:  IncredibleEmployment.be  This test is no t yet approved or cleared by the [place] FDA and  has been authorized for detection and/or diagnosis of SARS-CoV-2 by FDA under an Emergency Use Authorization (EUA). This EUA will remain  in effect (meaning this test can be used) for the duration of the COVID-19 declaration under Section 564(b)(1) of the Act, 21 U.S.C.section 360bbb-3(b)(1), unless the authorization is terminated  or revoked sooner.       Influenza A by PCR NEGATIVE NEGATIVE   Influenza B by PCR NEGATIVE NEGATIVE    Comment: (NOTE) The Xpert Xpress SARS-CoV-2/FLU/RSV plus assay is intended as an aid in the diagnosis of influenza from Nasopharyngeal swab specimens and should not be used as a sole basis for treatment. Nasal washings and aspirates are unacceptable for Xpert Xpress SARS-CoV-2/FLU/RSV testing.  Fact Sheet for Patients: EntrepreneurPulse.com.au  Fact Sheet for Healthcare Providers: IncredibleEmployment.be  This test is not yet approved or cleared by the [place] FDA and has been authorized for detection and/or diagnosis of SARS-CoV-2 by FDA under an Emergency Use Authorization (EUA). This EUA will remain in effect (meaning this test can be used) for the duration of the COVID-19 declaration under Section 564(b)(1) of the Act, 21 U.S.C. section 360bbb-3(b)(1), unless the authorization is terminated or revoked.  Performed at [facility], [address] [place], [place] [address]   CBC     Status: Abnormal   Collection Time: 07/07/21  4:52 AM  Result Value Ref Range   WBC 7.2 4.0 - 10.5 K/uL   RBC 3.77 (L) 3.87 - 5.11 MIL/uL   Hemoglobin 11.3 (L) 12.0 - 15.0 g/dL   HCT 35.4 (L) 36.0 - 46.0 %   MCV 93.9 80.0 - 100.0 fL   MCH 30.0 26.0 - 34.0 pg   MCHC 31.9 30.0 - 36.0 g/dL    RDW 14.4 11.5 - 15.5 %   Platelets 163 150 - 400 K/uL   nRBC 0.0 0.0 - 0.2 %    Comment: Performed at [facility], [address] [place], [place] [id number]  Basic metabolic panel     Status: Abnormal   Collection Time: 07/07/21  4:52 AM  Result Value Ref Range   Sodium 139 135 - 145 mmol/L   Potassium 3.3 (L) 3.5 - 5.1 mmol/L   Chloride 103 98 - 111 mmol/L   CO2 28 22 - 32 mmol/L   Glucose, Bld 80 70 - 99 mg/dL    Comment: Glucose reference range applies only to samples taken after fasting for at least 8 hours.   BUN 12 8 - 23 mg/dL   Creatinine, Ser 0.77 0.44 - 1.00 mg/dL   Calcium 8.5 (L) 8.9 - 10.3 mg/dL   GFR, Estimated >60 >60 mL/min    Comment: (NOTE) Calculated using the CKD-EPI Creatinine Equation (2021)    Anion gap 8 5 - 15    Comment: Performed at [facility], [address], [place],  [address]  Hepatic function panel  Status: Abnormal   Collection Time: 07/07/21  4:52 AM  Result Value Ref Range   Total Protein 5.6 (L) 6.5 - 8.1 g/dL   Albumin 3.1 (L) 3.5 - 5.0 g/dL   AST 260 (H) 15 - 41 U/L   ALT 223 (H) 0 - 44 U/L   Alkaline Phosphatase 120 38 - 126 U/L   Total Bilirubin 0.6 0.3 - 1.2 mg/dL   Bilirubin, Direct 0.1 0.0 - 0.2 mg/dL   Indirect Bilirubin 0.5 0.3 - 0.9 mg/dL    Comment: Performed at [facility], [address] [place], [place] [address]    DG Chest 2 View  Result Date: 07/06/2021 CLINICAL DATA:  Chest pain. EXAM: CHEST - 2 VIEW COMPARISON:  Chest x-ray 08/23/2015 FINDINGS: The cardiac silhouette, mediastinal and hilar contours are within normal limits and stable. The lungs are clear of an acute process. No pleural effusions or pulmonary lesions. The bony thorax is intact. Stable midthoracic compression deformities. IMPRESSION: No acute cardiopulmonary findings. Electronically Signed   By: Marijo Sanes M.D.   On: 07/06/2021 15:47   CT Angio Abd/Pel W and/or Wo Contrast  Result Date: 07/06/2021 CLINICAL DATA:  LUQ abdominal pain concern  for vascular occlusion - abd pain. sudden onset of upper abdominal/lower mid chest pain radiates to back rated 10/10. BP 218/100. Pt also vomited. EXAM: CTA ABDOMEN AND PELVIS WITH CONTRAST TECHNIQUE: Multidetector CT imaging of the abdomen and pelvis was performed using the standard protocol during bolus administration of intravenous contrast. Delayed images through the abdomen were also obtained. Multiplanar reconstructed images and MIPs were obtained and reviewed to evaluate the vascular anatomy. CONTRAST:  176mL OMNIPAQUE IOHEXOL 350 MG/ML SOLN COMPARISON:  None. FINDINGS: VASCULAR Aorta: Moderate to severe atherosclerotic plaque. Normal caliber aorta without aneurysm, dissection, vasculitis or significant stenosis. Celiac: Patent without evidence of aneurysm, dissection, vasculitis or significant stenosis. SMA: Patent without evidence of aneurysm, dissection, vasculitis or significant stenosis. Renals: Both renal arteries are patent without evidence of aneurysm, dissection, vasculitis, fibromuscular dysplasia or significant stenosis. IMA: Patent without evidence of aneurysm, dissection, vasculitis or significant stenosis. Inflow: At least moderate atherosclerotic plaque. Patent without evidence of aneurysm, dissection, vasculitis or significant stenosis. Proximal Outflow: Bilateral common femoral and visualized portions of the superficial and profunda femoral arteries are patent without evidence of aneurysm, dissection, vasculitis or significant stenosis. Veins: No obvious venous abnormality within the limitations of this arterial phase study. Review of the MIP images confirms the above findings. NON-VASCULAR Lower chest: No acute abnormality. Hepatobiliary: Fluid density 1 cm right hepatic lobe lesion likely represents a hepatic cyst. Vague hypodensity along the left hepatic lobe of unclear etiology (4:40). No focal liver abnormality. No gallstones. Hydropic gallbladder. Question gallbladder wall thickening.  There is pericholecystic fluid. No biliary dilatation. Pancreas: No focal lesion. Normal pancreatic contour. No surrounding inflammatory changes. No main pancreatic ductal dilatation. Spleen: Normal in size without focal abnormality. Adrenals/Urinary Tract: No adrenal nodule bilaterally. Bilateral kidneys enhance symmetrically. There is a 2 cm left superior pole simple fluid lesion likely represents a simple renal cyst. Subcentimeter hypodensities are too small to characterize. No hydronephrosis. No hydroureter. The urinary bladder is unremarkable. Stomach/Bowel: Stomach is within normal limits. No evidence of bowel wall thickening or dilatation. Stool throughout the colon. Colonic diverticulosis. Appendix appears normal. Lymphatic: No lymphadenopathy. Reproductive: Prostate is unremarkable. Other: No intraperitoneal free fluid. No intraperitoneal free gas. No organized fluid collection. Musculoskeletal: No abdominal wall hernia or abnormality. No suspicious lytic or blastic osseous lesions. No acute displaced  fracture. IMPRESSION: VASCULAR 1. No acute vascular abnormality. 2.  Aortic Atherosclerosis (ICD10-I70.0). NON-VASCULAR 1. Hydropic gallbladder with associated pericholecystic fluid and query gallbladder wall thickening. Recommend right upper quadrant ultrasound for further evaluation. 2. Constipation. 3. Colonic diverticulosis with no acute diverticulitis. Electronically Signed   By: Iven Finn M.D.   On: 07/06/2021 17:14   US Abdomen Limited RUQ (LIVER/GB)  Result Date: 07/07/2021 CLINICAL DATA:  Patient presents for left upper quadrant abdominal pain on 07/06/2021 and underwent CT imaging. Gallbladder distension and wall thickening was noted. Follow-up exam. EXAM: ULTRASOUND ABDOMEN LIMITED RIGHT UPPER QUADRANT COMPARISON:  None. FINDINGS: Gallbladder: Distended. Focal wall thickening adjacent to the liver, up to 8 mm. Trace pericholecystic fluid. No stones, however. Patient reportedly tender to  transducer pressure over the gallbladder. Common bile duct: Diameter: 6 mm.  No visualized duct stone. Liver: Borderline to mildly increased parenchymal echogenicity. No mass. Within normal limits in parenchymal echogenicity. Portal vein is patent on color Doppler imaging with normal direction of blood flow towards the liver. Other: None. IMPRESSION: 1. Distended gallbladder with focal wall thickening adjacent to the liver. No gallstones. Positive sonographic Murphy's sign. Trace pericholecystic fluid. Findings are equivocal for acute cholecystitis, particularly without the presence of gallstones. Focal wall thickening gallbladder could be due to adjacent liver inflammation. 2. Top-normal size of the common bile duct. No evidence of a duct stone. Electronically Signed   By: Lajean Manes M.D.   On: 07/07/2021 11:01     Assessment & Plan:  MONETTE CHAMU is a 66 y.o. female with who presents with concerns for acute cholecystitis.  Ultrasound demonstrated a distended gallbladder with focal wall thickening adjacent to the liver, no gallstones, trace pericholecystic fluid and positive sonographic Murphy sign.  WBC improved to 7.2 from 12.7.  AST increased to 260, ALT increased to 223  -While patient does not have demonstrated gallstones on ultrasound or CT, her gallbladder distention with wall thickening is still concerning for an acute cholecystitis type picture.  Patient also received pain medications prior to my evaluation, so this may have affected how she examined for me. -Plan to repeat hepatic function panel in the morning -Will give patient clear liquid diet today -NPO at midnight -Okay to continue antibiotics per primary team -Plan for laparoscopic cholecystectomy tomorrow, 07/08/2021 -I counseled the patient about the indication, risks and benefits of laparoscopic cholecystectomy.  She understands there is a very small chance for bleeding, infection, injury to normal structures (including common  bile duct), conversion to open surgery, persistent symptoms, evolution of postcholecystectomy diarrhea, need for secondary interventions, anesthesia reaction, cardiopulmonary issues and other risks not specifically detailed here. I described the expected recovery, the plan for follow-up and the restrictions during the recovery phase.  All questions were answered to the satisfaction of the patient.   -- Graciella Freer, DO [facility] [address] [place], [place] [address] [phone] (office)

## 2021-07-07 NOTE — Progress Notes (Signed)
Patients blood pressure 93/52, pulse 53. Patient sitting up in bed on phone. MD Courage made aware. New orders placed.

## 2021-07-07 NOTE — Progress Notes (Addendum)
PROGRESS NOTE     Kristen Ryan, is a 66 y.o. female, DOB - 29-Oct-1955, CH:[id number]  Admit date - 07/06/2021   Admitting Physician Kristen Rhymes, MD  Outpatient Primary MD for the patient is Kristen Nose, FNP  LOS - 1  Chief Complaint  Patient presents with   Chest Pain        Brief Narrative:   66 y.o. female with medical history significant of hypothyroidism, history of hypertension, history of bipolar disorder, asthma, COPD, hypertension, schizophrenia and neuropathy admitted on 07/06/2021 with acute cholecystitis  Assessment & Plan:   Principal Problem:   Acute acaculous cholecystitis Active Problems:   Bipolar 1 disorder ([place])   Eosinophilic asthma   COPD with asthma ([place])   1)-Acute acalculous cholecystitis-history, clinical exam, CT abdomen pelvis and right upper quadrant ultrasound are all consistent with a calculus cholecystitis -General surgery consult appreciated -Clear liquid diet today plans for possible lap chole in a.m. we will keep n.p.o. after midnight -Okay to continue Cipro and Flagyl IV -IV Dilaudid and IV Zofran as needed -Continue IV fluids  2)Elevated LFTs--- secondary to # 1 above monitor closely  3)HTN--BP has been soft, hold HCTZ, lisinopril, and metoprolol  4) COPD/asthma--no acute flareup, continue bronchodilators and Singulair  5) constipation--- lactulose and MiraLAX as prescribed  6) bipolar disorder-continue Geodon, Trintellix, trazodone nightly as needed  7)Peripheral neuropathy--continue gabapentin-  8)--- continue levothyroxine  Disposition/Need for in-Hospital Stay- patient unable to be discharged at this time due to -acute cholecystitis requiring IV fluids IV pain medications IV antibiotics pending surgical intervention with lap chole on 08/02/2021  Status is: Inpatient  Remains inpatient appropriate because:   Disposition: The patient is from: Home              Anticipated d/c is to: Home              Anticipated  d/c date is: 2 days              Patient currently is not medically stable to d/c. Barriers: Not Clinically Stable-   Code Status :  -  Code Status: Full Code   Family Communication:    NA (patient is alert, awake and coherent)   Consults  :  gen Surgery  DVT Prophylaxis  :   - SCDs   heparin injection 5,000 Units Start: 07/06/21 2200  Lab Results  Component Value Date   PLT 163 07/07/2021    Inpatient Medications  Scheduled Meds:  fluticasone furoate-vilanterol  1 puff Inhalation Daily   And   umeclidinium bromide  1 puff Inhalation Daily   gabapentin  600 mg Oral BID   heparin  5,000 Units Subcutaneous Q8H   levothyroxine  50 mcg Oral Daily   lisinopril  2.5 mg Oral Daily   metoprolol tartrate  25 mg Oral Daily   montelukast  10 mg Oral QHS   pantoprazole  80 mg Oral Daily   [START ON 07/08/2021] pneumococcal 23 valent vaccine  0.5 mL Intramuscular Tomorrow-1000   polyethylene glycol  17 g Oral BID   vortioxetine HBr  20 mg Oral Daily   ziprasidone  80 mg Oral BID WC   Continuous Infusions:  ciprofloxacin Stopped (07/07/21 0803)   dextrose 5 % and 0.45% NaCl 75 mL/hr at 07/06/21 2253   metronidazole Stopped (07/07/21 0629)   PRN Meds:.albuterol, HYDROmorphone (DILAUDID) injection, traZODone   Anti-infectives (From admission, onward)    Start     Dose/Rate Route Frequency Ordered Stop  07/07/21 0600  ciprofloxacin (CIPRO) IVPB 400 mg        400 mg 200 mL/hr over 60 Minutes Intravenous Every 12 hours 07/06/21 2012     07/06/21 1800  ciprofloxacin (CIPRO) IVPB 400 mg        400 mg 200 mL/hr over 60 Minutes Intravenous  Once 07/06/21 1752 07/06/21 1913   07/06/21 1800  metroNIDAZOLE (FLAGYL) IVPB 500 mg        500 mg 100 mL/hr over 60 Minutes Intravenous Every 12 hours 07/06/21 1752          Subjective: Kristen Ryan today has no fevers,  No chest pain,   No fever  Or chills   -Nausea but no emesis -Concerns about constipation and abdominal  pain  Objective: Vitals:   07/07/21 0617 07/07/21 1002 07/07/21 1235 07/07/21 1352  BP: 125/76 120/62  (!) 92/53  Pulse: 75 72  (!) 59  Resp: 18 18  18   Temp: 98.4 F (36.9 C) 98.9 F (37.2 C)  98.4 F (36.9 C)  TempSrc:  Oral    SpO2: 94% 95% 96% 95%  Weight:      Height:        Intake/Output Summary (Last 24 hours) at 07/07/2021 1712 Last data filed at 07/07/2021 1502 Gross per 24 hour  Intake 2421.46 ml  Output --  Net 2421.46 ml   Filed Weights   07/06/21 1452 07/06/21 2108  Weight: 63.5 kg 60.2 kg    Physical Exam  Gen:- Awake Alert,  in no apparent distress  HEENT:- [place].AT, No sclera icterus Neck-Supple Neck,No JVD,.  Lungs-  CTAB , fair symmetrical air movement CV- S1, S2 normal, regular  Abd-  +ve B.Sounds, Abd Soft, right upper quadrant tenderness Extremity/Skin:- No  edema, pedal pulses present  Psych-affect is appropriate, oriented x3 Neuro-no new focal deficits, no tremors  Data Reviewed: I have personally reviewed following labs and imaging studies  CBC: Recent Labs  Lab 07/06/21 1504 07/07/21 0452  WBC 12.7* 7.2  HGB 13.7 11.3*  HCT 42.2 35.4*  MCV 94.6 93.9  PLT 193 [id number]   Basic Metabolic Panel: Recent Labs  Lab 07/06/21 1504 07/07/21 0452  NA 139 139  K 3.5 3.3*  CL 99 103  CO2 30 28  GLUCOSE 121* 80  BUN 15 12  CREATININE 0.81 0.77  CALCIUM 8.9 8.5*   GFR: Estimated Creatinine Clearance: 65.6 mL/min (by C-G formula based on SCr of 0.77 mg/dL). Liver Function Tests: Recent Labs  Lab 07/06/21 1616 07/07/21 0452  AST 80* 260*  ALT 44 223*  ALKPHOS 76 120  BILITOT 0.8 0.6  PROT 6.7 5.6*  ALBUMIN 4.0 3.1*   Recent Labs  Lab 07/06/21 1616  LIPASE 32   No results for input(s): AMMONIA in the last 168 hours. Coagulation Profile: No results for input(s): INR, PROTIME in the last 168 hours. Cardiac Enzymes: No results for input(s): CKTOTAL, CKMB, CKMBINDEX, TROPONINI in the last 168 hours. BNP (last 3 results) No results  for input(s): PROBNP in the last 8760 hours. HbA1C: No results for input(s): HGBA1C in the last 72 hours. CBG: Recent Labs  Lab 07/07/21 1141  GLUCAP 96   Lipid Profile: No results for input(s): CHOL, HDL, LDLCALC, TRIG, CHOLHDL, LDLDIRECT in the last 72 hours. Thyroid Function Tests: No results for input(s): TSH, T4TOTAL, FREET4, T3FREE, THYROIDAB in the last 72 hours. Anemia Panel: No results for input(s): VITAMINB12, FOLATE, FERRITIN, TIBC, IRON, RETICCTPCT in the last 72 hours. Urine analysis:  Component Value Date/Time   COLORURINE YELLOW 09/07/2019 1406   APPEARANCEUR HAZY (A) 09/07/2019 1406   LABSPEC 1.024 09/07/2019 1406   PHURINE 5.0 09/07/2019 1406   GLUCOSEU NEGATIVE 09/07/2019 1406   HGBUR NEGATIVE 09/07/2019 1406   BILIRUBINUR NEGATIVE 09/07/2019 1406   KETONESUR NEGATIVE 09/07/2019 1406   PROTEINUR NEGATIVE 09/07/2019 1406   UROBILINOGEN 0.2 06/22/2013 0020   NITRITE POSITIVE (A) 09/07/2019 1406   LEUKOCYTESUR TRACE (A) 09/07/2019 1406   Sepsis Labs: @LABRCNTIP (procalcitonin:4,lacticidven:4)  ) Recent Results (from the past 240 hour(s))  Resp Panel by RT-PCR (Flu A&B, Covid) Nasopharyngeal Swab     Status: None   Collection Time: 07/06/21  6:10 PM   Specimen: Nasopharyngeal Swab; Nasopharyngeal(NP) swabs in vial transport medium  Result Value Ref Range Status   SARS Coronavirus 2 by RT PCR NEGATIVE NEGATIVE Final    Comment: (NOTE) SARS-CoV-2 target nucleic acids are NOT DETECTED.  The SARS-CoV-2 RNA is generally detectable in upper respiratory specimens during the acute phase of infection. The lowest concentration of SARS-CoV-2 viral copies this assay can detect is 138 copies/mL. A negative result does not preclude SARS-Cov-2 infection and should not be used as the sole basis for treatment or other patient management decisions. A negative result may occur with  improper specimen collection/handling, submission of specimen other than nasopharyngeal  swab, presence of viral mutation(s) within the areas targeted by this assay, and inadequate number of viral copies(<138 copies/mL). A negative result must be combined with clinical observations, patient history, and epidemiological information. The expected result is Negative.  Fact Sheet for Patients:  EntrepreneurPulse.com.au  Fact Sheet for Healthcare Providers:  IncredibleEmployment.be  This test is no t yet approved or cleared by the [place] FDA and  has been authorized for detection and/or diagnosis of SARS-CoV-2 by FDA under an Emergency Use Authorization (EUA). This EUA will remain  in effect (meaning this test can be used) for the duration of the COVID-19 declaration under Section 564(b)(1) of the Act, 21 U.S.C.section 360bbb-3(b)(1), unless the authorization is terminated  or revoked sooner.       Influenza A by PCR NEGATIVE NEGATIVE Final   Influenza B by PCR NEGATIVE NEGATIVE Final    Comment: (NOTE) The Xpert Xpress SARS-CoV-2/FLU/RSV plus assay is intended as an aid in the diagnosis of influenza from Nasopharyngeal swab specimens and should not be used as a sole basis for treatment. Nasal washings and aspirates are unacceptable for Xpert Xpress SARS-CoV-2/FLU/RSV testing.  Fact Sheet for Patients: EntrepreneurPulse.com.au  Fact Sheet for Healthcare Providers: IncredibleEmployment.be  This test is not yet approved or cleared by the [place] FDA and has been authorized for detection and/or diagnosis of SARS-CoV-2 by FDA under an Emergency Use Authorization (EUA). This EUA will remain in effect (meaning this test can be used) for the duration of the COVID-19 declaration under Section 564(b)(1) of the Act, 21 U.S.C. section 360bbb-3(b)(1), unless the authorization is terminated or revoked.  Performed at [facility], [address] [place], [place] [address]       Radiology  Studies: DG Chest 2 View  Result Date: 07/06/2021 CLINICAL DATA:  Chest pain. EXAM: CHEST - 2 VIEW COMPARISON:  Chest x-ray 08/23/2015 FINDINGS: The cardiac silhouette, mediastinal and hilar contours are within normal limits and stable. The lungs are clear of an acute process. No pleural effusions or pulmonary lesions. The bony thorax is intact. Stable midthoracic compression deformities. IMPRESSION: No acute cardiopulmonary findings. Electronically Signed   By: Ricky Stabs.D.  On: 07/06/2021 15:47   CT Angio Abd/Pel W and/or Wo Contrast  Result Date: 07/06/2021 CLINICAL DATA:  LUQ abdominal pain concern for vascular occlusion - abd pain. sudden onset of upper abdominal/lower mid chest pain radiates to back rated 10/10. BP 218/100. Pt also vomited. EXAM: CTA ABDOMEN AND PELVIS WITH CONTRAST TECHNIQUE: Multidetector CT imaging of the abdomen and pelvis was performed using the standard protocol during bolus administration of intravenous contrast. Delayed images through the abdomen were also obtained. Multiplanar reconstructed images and MIPs were obtained and reviewed to evaluate the vascular anatomy. CONTRAST:  118mL OMNIPAQUE IOHEXOL 350 MG/ML SOLN COMPARISON:  None. FINDINGS: VASCULAR Aorta: Moderate to severe atherosclerotic plaque. Normal caliber aorta without aneurysm, dissection, vasculitis or significant stenosis. Celiac: Patent without evidence of aneurysm, dissection, vasculitis or significant stenosis. SMA: Patent without evidence of aneurysm, dissection, vasculitis or significant stenosis. Renals: Both renal arteries are patent without evidence of aneurysm, dissection, vasculitis, fibromuscular dysplasia or significant stenosis. IMA: Patent without evidence of aneurysm, dissection, vasculitis or significant stenosis. Inflow: At least moderate atherosclerotic plaque. Patent without evidence of aneurysm, dissection, vasculitis or significant stenosis. Proximal Outflow: Bilateral common femoral and  visualized portions of the superficial and profunda femoral arteries are patent without evidence of aneurysm, dissection, vasculitis or significant stenosis. Veins: No obvious venous abnormality within the limitations of this arterial phase study. Review of the MIP images confirms the above findings. NON-VASCULAR Lower chest: No acute abnormality. Hepatobiliary: Fluid density 1 cm right hepatic lobe lesion likely represents a hepatic cyst. Vague hypodensity along the left hepatic lobe of unclear etiology (4:40). No focal liver abnormality. No gallstones. Hydropic gallbladder. Question gallbladder wall thickening. There is pericholecystic fluid. No biliary dilatation. Pancreas: No focal lesion. Normal pancreatic contour. No surrounding inflammatory changes. No main pancreatic ductal dilatation. Spleen: Normal in size without focal abnormality. Adrenals/Urinary Tract: No adrenal nodule bilaterally. Bilateral kidneys enhance symmetrically. There is a 2 cm left superior pole simple fluid lesion likely represents a simple renal cyst. Subcentimeter hypodensities are too small to characterize. No hydronephrosis. No hydroureter. The urinary bladder is unremarkable. Stomach/Bowel: Stomach is within normal limits. No evidence of bowel wall thickening or dilatation. Stool throughout the colon. Colonic diverticulosis. Appendix appears normal. Lymphatic: No lymphadenopathy. Reproductive: Prostate is unremarkable. Other: No intraperitoneal free fluid. No intraperitoneal free gas. No organized fluid collection. Musculoskeletal: No abdominal wall hernia or abnormality. No suspicious lytic or blastic osseous lesions. No acute displaced fracture. IMPRESSION: VASCULAR 1. No acute vascular abnormality. 2.  Aortic Atherosclerosis (ICD10-I70.0). NON-VASCULAR 1. Hydropic gallbladder with associated pericholecystic fluid and query gallbladder wall thickening. Recommend right upper quadrant ultrasound for further evaluation. 2.  Constipation. 3. Colonic diverticulosis with no acute diverticulitis. Electronically Signed   By: Iven Finn M.D.   On: 07/06/2021 17:14   US Abdomen Limited RUQ (LIVER/GB)  Result Date: 07/07/2021 CLINICAL DATA:  Patient presents for left upper quadrant abdominal pain on 07/06/2021 and underwent CT imaging. Gallbladder distension and wall thickening was noted. Follow-up exam. EXAM: ULTRASOUND ABDOMEN LIMITED RIGHT UPPER QUADRANT COMPARISON:  None. FINDINGS: Gallbladder: Distended. Focal wall thickening adjacent to the liver, up to 8 mm. Trace pericholecystic fluid. No stones, however. Patient reportedly tender to transducer pressure over the gallbladder. Common bile duct: Diameter: 6 mm.  No visualized duct stone. Liver: Borderline to mildly increased parenchymal echogenicity. No mass. Within normal limits in parenchymal echogenicity. Portal vein is patent on color Doppler imaging with normal direction of blood flow towards the liver. Other: None. IMPRESSION: 1. Distended gallbladder  with focal wall thickening adjacent to the liver. No gallstones. Positive sonographic Murphy's sign. Trace pericholecystic fluid. Findings are equivocal for acute cholecystitis, particularly without the presence of gallstones. Focal wall thickening gallbladder could be due to adjacent liver inflammation. 2. Top-normal size of the common bile duct. No evidence of a duct stone. Electronically Signed   By: Lajean Manes M.D.   On: 07/07/2021 11:01     Scheduled Meds:  fluticasone furoate-vilanterol  1 puff Inhalation Daily   And   umeclidinium bromide  1 puff Inhalation Daily   gabapentin  600 mg Oral BID   heparin  5,000 Units Subcutaneous Q8H   levothyroxine  50 mcg Oral Daily   lisinopril  2.5 mg Oral Daily   metoprolol tartrate  25 mg Oral Daily   montelukast  10 mg Oral QHS   pantoprazole  80 mg Oral Daily   [START ON 07/08/2021] pneumococcal 23 valent vaccine  0.5 mL Intramuscular Tomorrow-1000   polyethylene  glycol  17 g Oral BID   vortioxetine HBr  20 mg Oral Daily   ziprasidone  80 mg Oral BID WC   Continuous Infusions:  ciprofloxacin Stopped (07/07/21 0803)   dextrose 5 % and 0.45% NaCl 75 mL/hr at 07/06/21 2253   metronidazole Stopped (07/07/21 0629)     LOS: 1 day    Roxan Hockey M.D on 07/07/2021 at 5:12 PM  Go to [email or web address] - for contact info  [facility] - Office  [phone]  If 7PM-7AM, please contact night-coverage [email or web address] Password [facility] 07/07/2021, 5:12 PM

## 2021-07-07 NOTE — Consult Note (Signed)
[facility] Consult ° °Reason for Consult: Acute cholecystitis °Referring Physician: Courage Emokpae, MD ° °Chief Complaint   °Chest Pain °  ° ° °HPI: Kristen Ryan is a 66 y.o. female with a PMH significant for HTN, COPD, asthma, bipolar disorder, and schizophrenia who presents with a 1 day history of nausea, vomiting, and epigastric/right upper quadrant abdominal pain.  She states that she has had increasing issues with her stomach for the last 2 weeks, but that this pain acutely occurred yesterday with the associated nausea and vomiting.  Her pain is in the right upper quadrant/epigastrium, and radiates to the right flank.  Her episode of emesis was bilious and after eating a piece of bread and drinking some Dr. Pepper. She denies any history of gallbladder attacks in the past.  She states that the pain is slightly improved since being in the hospital.  She is currently denying nausea and vomiting.  She states that she is hungry.  Her last bowel movement was yesterday, and it was normal for her.  She has been passing flatus.  She confirms taking aspirin 325 mg daily with her last dose on 07/06/2021.  She denies any history of abdominal surgeries. ° °Past Medical History:  °Diagnosis Date  ° Anemia   ° Asthma   ° Bipolar 1 disorder (HCC)   ° Bronchitis   ° COPD (chronic obstructive pulmonary disease) (HCC)   ° Diverticulitis   ° Hypertension   ° IBS (irritable bowel syndrome)   ° Migraine   ° Neuropathy   ° Schizophrenia (HCC)   ° Thyroid disease   ° ° °Past Surgical History:  °Procedure Laterality Date  ° ABDOMINAL HYSTERECTOMY    ° broke left arm    ° CESAREAN SECTION    ° COLONOSCOPY N/A 08/30/2014  ° Procedure: COLONOSCOPY;  Surgeon: Najeeb U Rehman, MD;  Location: [facility];  Service: Endoscopy;  Laterality: N/A;  1200  ° ELBOW SURGERY    ° FOOT SURGERY    ° KNEE SURGERY    ° TONSILLECTOMY    ° ° °Family History  °Problem Relation Age of Onset  ° Dementia Mother   ° Bipolar disorder  Mother   ° Dementia Father   ° Other Son   °     MVA accident  ° ° °Social History  ° °Tobacco Use  ° Smoking status: Former  °  Packs/day: 4.00  °  Types: Cigarettes  °  Quit date: 07/06/2008  °  Years since quitting: 13.0  ° Smokeless tobacco: Never  °Vaping Use  ° Vaping Use: Never used  °Substance Use Topics  ° Alcohol use: No  ° Drug use: No  ° ° °Medications: I have reviewed the patient's current medications. ° °Allergies  °Allergen Reactions  ° Aminophylline Anaphylaxis  ° Sumatriptan Anaphylaxis and Other (See Comments)  °  Causes fainting  ° Theophylline Anaphylaxis  ° Codeine Itching and Other (See Comments)  °  Too strong for patient °  ° Depakote [Divalproex Sodium] Nausea Only  ° Divalproex Sodium Other (See Comments)  °  Causes hair to fall out  ° Doxycycline Other (See Comments)  °  headache  ° Lamictal [Lamotrigine]   °  Destroys thyroid  ° Lithium Other (See Comments)  °  Adverse reaction to Thyroid   ° Magnesium-Containing Compounds Hives  ° Nabumetone Swelling  ° Naproxen Swelling  ° Other   °  Hair dye  ° Penicillins Hives and Nausea And Vomiting  °    Has patient had a PCN reaction causing immediate rash, facial/tongue/throat swelling, SOB or lightheadedness with hypotension: Yes °Has patient had a PCN reaction causing severe rash involving mucus membranes or skin necrosis: No °Has patient had a PCN reaction that required hospitalization Yes °Has patient had a PCN reaction occurring within the last 10 years: No °If all of the above answers are "NO", then may proceed with Cephalosporin use. °  ° Pentazocine Lactate Other (See Comments)  °  Unknown reaction  ° Risperidone Other (See Comments)  °  Insomnia   ° Septra Ds [Sulfamethoxazole-Trimethoprim]   °  Pt unsure, long time ago  ° Seroquel [Quetiapine] Swelling  ° Tegretol [Carbamazepine] Nausea And Vomiting  ° Clonidine Derivatives   °  Dizziness at 0.1 mg  ° Tetracycline Rash  °  Headaches  ° Zyprexa [Olanzapine] Rash and Other (See Comments)  °   Insomnia  ° ° ° °ROS:  °Constitutional: negative for anorexia, fevers, malaise, and night sweats °Respiratory: negative for cough and dyspnea on exertion °Cardiovascular: negative for chest pain and dyspnea °Gastrointestinal: positive for abdominal pain, negative for constipation, diarrhea, nausea, and vomiting ° °Blood pressure 120/62, pulse 72, temperature 98.9 °F (37.2 °C), temperature source Oral, resp. rate 18, height 5' 6" (1.676 m), weight 60.2 kg, SpO2 95 %. °Physical Exam °Vitals reviewed.  °Constitutional:   °   General: She is not in acute distress. °   Appearance: She is well-developed. She is not toxic-appearing.  °HENT:  °   Head: Normocephalic and atraumatic.  °Eyes:  °   Extraocular Movements: Extraocular movements intact.  °   Pupils: Pupils are equal, round, and reactive to light.  °Cardiovascular:  °   Rate and Rhythm: Normal rate and regular rhythm.  °Pulmonary:  °   Effort: Pulmonary effort is normal. No tachypnea or respiratory distress.  °   Breath sounds: Normal breath sounds.  °Abdominal:  °   Comments: Abdomen soft, no tenderness to percussion, mild tenderness to palpation in the epigastrium and periumbilical area, negative Murphy's sign; no rigidity, guarding, rebound tenderness  °Skin: °   General: Skin is warm and dry.  °Neurological:  °   General: No focal deficit present.  °   Mental Status: She is alert and oriented to person, place, and time.  °Psychiatric:     °   Mood and Affect: Mood normal.     °   Behavior: Behavior normal.  ° ° °Results: °Results for orders placed or performed during the hospital encounter of 07/06/21 (from the past 48 hour(s))  °Basic metabolic panel     Status: Abnormal  ° Collection Time: 07/06/21  3:04 PM  °Result Value Ref Range  ° Sodium 139 135 - 145 mmol/L  ° Potassium 3.5 3.5 - 5.1 mmol/L  ° Chloride 99 98 - 111 mmol/L  ° CO2 30 22 - 32 mmol/L  ° Glucose, Bld 121 (H) 70 - 99 mg/dL  °  Comment: Glucose reference range applies only to samples taken after  fasting for at least 8 hours.  ° BUN 15 8 - 23 mg/dL  ° Creatinine, Ser 0.81 0.44 - 1.00 mg/dL  ° Calcium 8.9 8.9 - 10.3 mg/dL  ° GFR, Estimated >60 >60 mL/min  °  Comment: (NOTE) °Calculated using the CKD-EPI Creatinine Equation (2021) °  ° Anion gap 10 5 - 15  °  Comment: Performed at [facility], [address] [place], [place] [address]  °CBC     Status:   Abnormal  ° Collection Time: 07/06/21  3:04 PM  °Result Value Ref Range  ° WBC 12.7 (H) 4.0 - 10.5 K/uL  ° RBC 4.46 3.87 - 5.11 MIL/uL  ° Hemoglobin 13.7 12.0 - 15.0 g/dL  ° HCT 42.2 36.0 - 46.0 %  ° MCV 94.6 80.0 - 100.0 fL  ° MCH 30.7 26.0 - 34.0 pg  ° MCHC 32.5 30.0 - 36.0 g/dL  ° RDW 14.3 11.5 - 15.5 %  ° Platelets 193 150 - 400 K/uL  ° nRBC 0.0 0.0 - 0.2 %  °  Comment: Performed at [facility], [address] [place], [place] [address]  °Troponin I (High Sensitivity)     Status: None  ° Collection Time: 07/06/21  3:04 PM  °Result Value Ref Range  ° Troponin I (High Sensitivity) 4 <18 ng/L  °  Comment: (NOTE) °Elevated high sensitivity troponin I (hsTnI) values and significant  °changes across serial measurements may suggest ACS but many other  °chronic and acute conditions are known to elevate hsTnI results.  °Refer to the "Links" section for chest pain algorithms and additional  °guidance. °Performed at [facility], [address] [place], [place] [address] °  °Lipase, blood     Status: None  ° Collection Time: 07/06/21  4:16 PM  °Result Value Ref Range  ° Lipase 32 11 - 51 U/L  °  Comment: Performed at [facility], [address] [place], [place] [address]  °Hepatic function panel     Status: Abnormal  ° Collection Time: 07/06/21  4:16 PM  °Result Value Ref Range  ° Total Protein 6.7 6.5 - 8.1 g/dL  ° Albumin 4.0 3.5 - 5.0 g/dL  ° AST 80 (H) 15 - 41 U/L  ° ALT 44 0 - 44 U/L  ° Alkaline Phosphatase 76 38 - 126 U/L  ° Total Bilirubin 0.8 0.3 - 1.2 mg/dL  ° Bilirubin, Direct 0.4 (H) 0.0 - 0.2 mg/dL  ° Indirect Bilirubin 0.4 0.3 - 0.9 mg/dL  °  Comment:  Performed at [facility], [address] [place], [place] [address]  °Lactic acid, plasma     Status: None  ° Collection Time: 07/06/21  4:16 PM  °Result Value Ref Range  ° Lactic Acid, Venous 1.1 0.5 - 1.9 mmol/L  °  Comment: Performed at [facility], [address] [place], [place] [address]  °Troponin I (High Sensitivity)     Status: None  ° Collection Time: 07/06/21  5:15 PM  °Result Value Ref Range  ° Troponin I (High Sensitivity) 4 <18 ng/L  °  Comment: (NOTE) °Elevated high sensitivity troponin I (hsTnI) values and significant  °changes across serial measurements may suggest ACS but many other  °chronic and acute conditions are known to elevate hsTnI results.  °Refer to the "Links" section for chest pain algorithms and additional  °guidance. °Performed at [facility], [address] [place], [place] [address] °  °Resp Panel by RT-PCR (Flu A&B, Covid) Nasopharyngeal Swab     Status: None  ° Collection Time: 07/06/21  6:10 PM  ° Specimen: Nasopharyngeal Swab; Nasopharyngeal(NP) swabs in vial transport medium  °Result Value Ref Range  ° SARS Coronavirus 2 by RT PCR NEGATIVE NEGATIVE  °  Comment: (NOTE) °SARS-CoV-2 target nucleic acids are NOT DETECTED. ° °The SARS-CoV-2 RNA is generally detectable in upper respiratory °specimens during the acute phase of infection. The lowest °concentration of SARS-CoV-2 viral copies this assay can detect is °138 copies/mL. A negative result does   not preclude SARS-Cov-2 °infection and should not be used as the sole basis for treatment or °other patient management decisions. A negative result may occur with  °improper specimen collection/handling, submission of specimen other °than nasopharyngeal swab, presence of viral mutation(s) within the °areas targeted by this assay, and inadequate number of viral °copies(<138 copies/mL). A negative result must be combined with °clinical observations, patient history, and epidemiological °information. The expected result is  Negative. ° °Fact Sheet for Patients:  °https://www.fda.gov/media/152166/download ° °Fact Sheet for Healthcare Providers:  °https://www.fda.gov/media/152162/download ° °This test is no t yet approved or cleared by the [place] FDA and  °has been authorized for detection and/or diagnosis of SARS-CoV-2 by °FDA under an Emergency Use Authorization (EUA). This EUA will remain  °in effect (meaning this test can be used) for the duration of the °COVID-19 declaration under Section 564(b)(1) of the Act, 21 °U.S.C.section 360bbb-3(b)(1), unless the authorization is terminated  °or revoked sooner.  ° ° °  ° Influenza A by PCR NEGATIVE NEGATIVE  ° Influenza B by PCR NEGATIVE NEGATIVE  °  Comment: (NOTE) °The Xpert Xpress SARS-CoV-2/FLU/RSV plus assay is intended as an aid °in the diagnosis of influenza from Nasopharyngeal swab specimens and °should not be used as a sole basis for treatment. Nasal washings and °aspirates are unacceptable for Xpert Xpress SARS-CoV-2/FLU/RSV °testing. ° °Fact Sheet for Patients: °https://www.fda.gov/media/152166/download ° °Fact Sheet for Healthcare Providers: °https://www.fda.gov/media/152162/download ° °This test is not yet approved or cleared by the [place] FDA and °has been authorized for detection and/or diagnosis of SARS-CoV-2 by °FDA under an Emergency Use Authorization (EUA). This EUA will remain °in effect (meaning this test can be used) for the duration of the °COVID-19 declaration under Section 564(b)(1) of the Act, 21 U.S.C. °section 360bbb-3(b)(1), unless the authorization is terminated or °revoked. ° °Performed at [facility], [address] [place], [place] [address] °  °CBC     Status: Abnormal  ° Collection Time: 07/07/21  4:52 AM  °Result Value Ref Range  ° WBC 7.2 4.0 - 10.5 K/uL  ° RBC 3.77 (L) 3.87 - 5.11 MIL/uL  ° Hemoglobin 11.3 (L) 12.0 - 15.0 g/dL  ° HCT 35.4 (L) 36.0 - 46.0 %  ° MCV 93.9 80.0 - 100.0 fL  ° MCH 30.0 26.0 - 34.0 pg  ° MCHC 31.9 30.0 - 36.0 g/dL   ° RDW 14.4 11.5 - 15.5 %  ° Platelets 163 150 - 400 K/uL  ° nRBC 0.0 0.0 - 0.2 %  °  Comment: Performed at [facility], [address] [place], [place] [address]  °Basic metabolic panel     Status: Abnormal  ° Collection Time: 07/07/21  4:52 AM  °Result Value Ref Range  ° Sodium 139 135 - 145 mmol/L  ° Potassium 3.3 (L) 3.5 - 5.1 mmol/L  ° Chloride 103 98 - 111 mmol/L  ° CO2 28 22 - 32 mmol/L  ° Glucose, Bld 80 70 - 99 mg/dL  °  Comment: Glucose reference range applies only to samples taken after fasting for at least 8 hours.  ° BUN 12 8 - 23 mg/dL  ° Creatinine, Ser 0.77 0.44 - 1.00 mg/dL  ° Calcium 8.5 (L) 8.9 - 10.3 mg/dL  ° GFR, Estimated >60 >60 mL/min  °  Comment: (NOTE) °Calculated using the CKD-EPI Creatinine Equation (2021) °  ° Anion gap 8 5 - 15  °  Comment: Performed at  Hospital, [address] [place], [place] [address]  °Hepatic function panel       Status: Abnormal  ° Collection Time: 07/07/21  4:52 AM  °Result Value Ref Range  ° Total Protein 5.6 (L) 6.5 - 8.1 g/dL  ° Albumin 3.1 (L) 3.5 - 5.0 g/dL  ° AST 260 (H) 15 - 41 U/L  ° ALT 223 (H) 0 - 44 U/L  ° Alkaline Phosphatase 120 38 - 126 U/L  ° Total Bilirubin 0.6 0.3 - 1.2 mg/dL  ° Bilirubin, Direct 0.1 0.0 - 0.2 mg/dL  ° Indirect Bilirubin 0.5 0.3 - 0.9 mg/dL  °  Comment: Performed at [facility], [address] [place], [place] [address]  ° ° °DG Chest 2 View ° °Result Date: 07/06/2021 °CLINICAL DATA:  Chest pain. EXAM: CHEST - 2 VIEW COMPARISON:  Chest x-ray 08/23/2015 FINDINGS: The cardiac silhouette, mediastinal and hilar contours are within normal limits and stable. The lungs are clear of an acute process. No pleural effusions or pulmonary lesions. The bony thorax is intact. Stable midthoracic compression deformities. IMPRESSION: No acute cardiopulmonary findings. Electronically Signed   By: P.  Gallerani M.D.   On: 07/06/2021 15:47  ° °CT Angio Abd/Pel W and/or Wo Contrast ° °Result Date: 07/06/2021 °CLINICAL DATA:  LUQ abdominal pain concern  for vascular occlusion - abd pain. sudden onset of upper abdominal/lower mid chest pain radiates to back rated 10/10. BP 218/100. Pt also vomited. EXAM: CTA ABDOMEN AND PELVIS WITH CONTRAST TECHNIQUE: Multidetector CT imaging of the abdomen and pelvis was performed using the standard protocol during bolus administration of intravenous contrast. Delayed images through the abdomen were also obtained. Multiplanar reconstructed images and MIPs were obtained and reviewed to evaluate the vascular anatomy. CONTRAST:  100mL OMNIPAQUE IOHEXOL 350 MG/ML SOLN COMPARISON:  None. FINDINGS: VASCULAR Aorta: Moderate to severe atherosclerotic plaque. Normal caliber aorta without aneurysm, dissection, vasculitis or significant stenosis. Celiac: Patent without evidence of aneurysm, dissection, vasculitis or significant stenosis. SMA: Patent without evidence of aneurysm, dissection, vasculitis or significant stenosis. Renals: Both renal arteries are patent without evidence of aneurysm, dissection, vasculitis, fibromuscular dysplasia or significant stenosis. IMA: Patent without evidence of aneurysm, dissection, vasculitis or significant stenosis. Inflow: At least moderate atherosclerotic plaque. Patent without evidence of aneurysm, dissection, vasculitis or significant stenosis. Proximal Outflow: Bilateral common femoral and visualized portions of the superficial and profunda femoral arteries are patent without evidence of aneurysm, dissection, vasculitis or significant stenosis. Veins: No obvious venous abnormality within the limitations of this arterial phase study. Review of the MIP images confirms the above findings. NON-VASCULAR Lower chest: No acute abnormality. Hepatobiliary: Fluid density 1 cm right hepatic lobe lesion likely represents a hepatic cyst. Vague hypodensity along the left hepatic lobe of unclear etiology (4:40). No focal liver abnormality. No gallstones. Hydropic gallbladder. Question gallbladder wall thickening.  There is pericholecystic fluid. No biliary dilatation. Pancreas: No focal lesion. Normal pancreatic contour. No surrounding inflammatory changes. No main pancreatic ductal dilatation. Spleen: Normal in size without focal abnormality. Adrenals/Urinary Tract: No adrenal nodule bilaterally. Bilateral kidneys enhance symmetrically. There is a 2 cm left superior pole simple fluid lesion likely represents a simple renal cyst. Subcentimeter hypodensities are too small to characterize. No hydronephrosis. No hydroureter. The urinary bladder is unremarkable. Stomach/Bowel: Stomach is within normal limits. No evidence of bowel wall thickening or dilatation. Stool throughout the colon. Colonic diverticulosis. Appendix appears normal. Lymphatic: No lymphadenopathy. Reproductive: Prostate is unremarkable. Other: No intraperitoneal free fluid. No intraperitoneal free gas. No organized fluid collection. Musculoskeletal: No abdominal wall hernia or abnormality. No suspicious lytic or blastic osseous lesions. No acute displaced   fracture. IMPRESSION: VASCULAR 1. No acute vascular abnormality. 2.  Aortic Atherosclerosis (ICD10-I70.0). NON-VASCULAR 1. Hydropic gallbladder with associated pericholecystic fluid and query gallbladder wall thickening. Recommend right upper quadrant ultrasound for further evaluation. 2. Constipation. 3. Colonic diverticulosis with no acute diverticulitis. Electronically Signed   By: Morgane  Naveau M.D.   On: 07/06/2021 17:14  ° °US Abdomen Limited RUQ (LIVER/GB) ° °Result Date: 07/07/2021 °CLINICAL DATA:  Patient presents for left upper quadrant abdominal pain on 07/06/2021 and underwent CT imaging. Gallbladder distension and wall thickening was noted. Follow-up exam. EXAM: ULTRASOUND ABDOMEN LIMITED RIGHT UPPER QUADRANT COMPARISON:  None. FINDINGS: Gallbladder: Distended. Focal wall thickening adjacent to the liver, up to 8 mm. Trace pericholecystic fluid. No stones, however. Patient reportedly tender to  transducer pressure over the gallbladder. Common bile duct: Diameter: 6 mm.  No visualized duct stone. Liver: Borderline to mildly increased parenchymal echogenicity. No mass. Within normal limits in parenchymal echogenicity. Portal vein is patent on color Doppler imaging with normal direction of blood flow towards the liver. Other: None. IMPRESSION: 1. Distended gallbladder with focal wall thickening adjacent to the liver. No gallstones. Positive sonographic Murphy's sign. Trace pericholecystic fluid. Findings are equivocal for acute cholecystitis, particularly without the presence of gallstones. Focal wall thickening gallbladder could be due to adjacent liver inflammation. 2. Top-normal size of the common bile duct. No evidence of a duct stone. Electronically Signed   By: David  Ormond M.D.   On: 07/07/2021 11:01   ° ° °Assessment & Plan:  °Kristen Ryan is a 65 y.o. female with who presents with concerns for acute cholecystitis.  Ultrasound demonstrated a distended gallbladder with focal wall thickening adjacent to the liver, no gallstones, trace pericholecystic fluid and positive sonographic Murphy sign.  WBC improved to 7.2 from 12.7.  AST increased to 260, ALT increased to 223 ° °-While patient does not have demonstrated gallstones on ultrasound or CT, her gallbladder distention with wall thickening is still concerning for an acute cholecystitis type picture.  Patient also received pain medications prior to my evaluation, so this may have affected how she examined for me. °-Plan to repeat hepatic function panel in the morning °-Will give patient clear liquid diet today °-NPO at midnight °-Okay to continue antibiotics per primary team °-Plan for laparoscopic cholecystectomy tomorrow, 07/08/2021 °-I counseled the patient about the indication, risks and benefits of laparoscopic cholecystectomy.  She understands there is a very small chance for bleeding, infection, injury to normal structures (including common  bile duct), conversion to open surgery, persistent symptoms, evolution of postcholecystectomy diarrhea, need for secondary interventions, anesthesia reaction, cardiopulmonary issues and other risks not specifically detailed here. I described the expected recovery, the plan for follow-up and the restrictions during the recovery phase.  All questions were answered to the satisfaction of the patient. ° ° °-- °Holten Spano, DO °[facility] °[address] °[place], [place] [address] °[phone] (office) ° ° ° ° °

## 2021-07-08 ENCOUNTER — Other Ambulatory Visit: Payer: Self-pay

## 2021-07-08 ENCOUNTER — Encounter (HOSPITAL_COMMUNITY): Payer: Self-pay | Admitting: Internal Medicine

## 2021-07-08 ENCOUNTER — Inpatient Hospital Stay (HOSPITAL_COMMUNITY): Payer: Medicare Other | Admitting: Certified Registered Nurse Anesthetist

## 2021-07-08 ENCOUNTER — Encounter (HOSPITAL_COMMUNITY)
Admission: EM | Disposition: A | Discharge status: Home / Self Care | Payer: Self-pay | Source: Home / Self Care | Attending: Family Medicine

## 2021-07-08 DIAGNOSIS — K810 Acute cholecystitis: Secondary | ICD-10-CM | POA: Diagnosis not present

## 2021-07-08 HISTORY — PX: CHOLECYSTECTOMY: SHX55

## 2021-07-08 LAB — COMPREHENSIVE METABOLIC PANEL
ALT: 151 U/L — ABNORMAL HIGH (ref 0–44)
AST: 95 U/L — ABNORMAL HIGH (ref 15–41)
Albumin: 3.3 g/dL — ABNORMAL LOW (ref 3.5–5.0)
Alkaline Phosphatase: 113 U/L (ref 38–126)
Anion gap: 8 (ref 5–15)
BUN: 6 mg/dL — ABNORMAL LOW (ref 8–23)
CO2: 28 mmol/L (ref 22–32)
Calcium: 9 mg/dL (ref 8.9–10.3)
Chloride: 107 mmol/L (ref 98–111)
Creatinine, Ser: 0.73 mg/dL (ref 0.44–1.00)
GFR, Estimated: >60 mL/min (ref >60)
Glucose, Bld: 100 mg/dL — ABNORMAL HIGH (ref 70–99)
Potassium: 4.3 mmol/L (ref 3.5–5.1)
Sodium: 143 mmol/L (ref 135–145)
Total Bilirubin: 0.6 mg/dL (ref 0.3–1.2)
Total Protein: 5.9 g/dL — ABNORMAL LOW (ref 6.5–8.1)

## 2021-07-08 LAB — GLUCOSE, CAPILLARY
Glucose-Capillary: 96 mg/dL (ref 70–99)
Glucose-Capillary: 104 mg/dL — ABNORMAL HIGH (ref 70–99)

## 2021-07-08 LAB — CBC
HCT: 37.3 % (ref 36.0–46.0)
Hemoglobin: 11.8 g/dL — ABNORMAL LOW (ref 12.0–15.0)
MCH: 30.7 pg (ref 26.0–34.0)
MCHC: 31.6 g/dL (ref 30.0–36.0)
MCV: 97.1 fL (ref 80.0–100.0)
Platelets: 160 10*3/uL (ref 150–400)
RBC: 3.84 MIL/uL — ABNORMAL LOW (ref 3.87–5.11)
RDW: 14.9 % (ref 11.5–15.5)
WBC: 4.5 10*3/uL (ref 4.0–10.5)
nRBC: 0 % (ref 0.0–0.2)

## 2021-07-08 LAB — SURGICAL PCR SCREEN
MRSA, PCR: NEGATIVE
Staphylococcus aureus: NEGATIVE

## 2021-07-08 SURGERY — LAPAROSCOPIC CHOLECYSTECTOMY
Anesthesia: General | Site: Abdomen

## 2021-07-08 MED ORDER — FENTANYL CITRATE (PF) 250 MCG/5ML IJ SOLN
INTRAMUSCULAR | Status: DC | PRN
  Administered 2021-07-08: 25 ug via INTRAVENOUS
  Administered 2021-07-08: 50 ug via INTRAVENOUS
  Administered 2021-07-08: 100 ug via INTRAVENOUS
  Administered 2021-07-08: 50 ug via INTRAVENOUS
  Administered 2021-07-08: 25 ug via INTRAVENOUS

## 2021-07-08 MED ORDER — ALBUTEROL SULFATE HFA 108 (90 BASE) MCG/ACT IN AERS
1.0000 | INHALATION_SPRAY | Freq: Four times a day (QID) | RESPIRATORY_TRACT | 3 refills | Status: DC | PRN
Start: 2021-07-08 — End: 2024-02-13

## 2021-07-08 MED ORDER — BUPIVACAINE HCL (PF) 0.5 % IJ SOLN
INTRAMUSCULAR | Status: DC | PRN
  Administered 2021-07-08: 10 mL

## 2021-07-08 MED ORDER — DEXAMETHASONE SODIUM PHOSPHATE 10 MG/ML IJ SOLN
INTRAMUSCULAR | Status: DC | PRN
  Administered 2021-07-08: 10 mg via INTRAVENOUS

## 2021-07-08 MED ORDER — FENTANYL CITRATE (PF) 250 MCG/5ML IJ SOLN
INTRAMUSCULAR | Status: AC
  Filled 2021-07-08: qty 5

## 2021-07-08 MED ORDER — LIDOCAINE 2% (20 MG/ML) 5 ML SYRINGE
INTRAMUSCULAR | Status: DC | PRN
  Administered 2021-07-08: 60 mg via INTRAVENOUS

## 2021-07-08 MED ORDER — PROPOFOL 10 MG/ML IV BOLUS
INTRAVENOUS | Status: AC
  Filled 2021-07-08: qty 20

## 2021-07-08 MED ORDER — ETOMIDATE 2 MG/ML IV SOLN
INTRAVENOUS | Status: AC
  Filled 2021-07-08: qty 10

## 2021-07-08 MED ORDER — ROCURONIUM BROMIDE 10 MG/ML (PF) SYRINGE
PREFILLED_SYRINGE | INTRAVENOUS | Status: AC
  Filled 2021-07-08: qty 10

## 2021-07-08 MED ORDER — ONDANSETRON HCL 4 MG PO TABS: 4.0000 mg | ORAL_TABLET | Freq: Three times a day (TID) | ORAL | 0 refills | Status: DC | PRN

## 2021-07-08 MED ORDER — ACETAMINOPHEN 500 MG PO TABS
1000.0000 mg | ORAL_TABLET | Freq: Four times a day (QID) | ORAL | Status: DC
  Administered 2021-07-08: 1000 mg via ORAL
  Filled 2021-07-08: qty 2

## 2021-07-08 MED ORDER — HYDROMORPHONE HCL 1 MG/ML IJ SOLN
0.5000 mg | INTRAMUSCULAR | Status: DC | PRN
  Administered 2021-07-08: 0.5 mg via INTRAVENOUS
  Filled 2021-07-08: qty 0.5

## 2021-07-08 MED ORDER — HEMOSTATIC AGENTS (NO CHARGE) OPTIME
TOPICAL | Status: DC | PRN
  Administered 2021-07-08 (×2): 1 via TOPICAL

## 2021-07-08 MED ORDER — ALBUTEROL SULFATE HFA 108 (90 BASE) MCG/ACT IN AERS
INHALATION_SPRAY | RESPIRATORY_TRACT | Status: AC
  Filled 2021-07-08: qty 13.4

## 2021-07-08 MED ORDER — FENTANYL CITRATE PF 50 MCG/ML IJ SOSY
25.0000 ug | PREFILLED_SYRINGE | INTRAMUSCULAR | Status: DC | PRN
  Administered 2021-07-08 (×3): 50 ug via INTRAVENOUS
  Filled 2021-07-08 (×3): qty 1

## 2021-07-08 MED ORDER — LIDOCAINE HCL (PF) 2 % IJ SOLN
INTRAMUSCULAR | Status: AC
  Filled 2021-07-08: qty 5

## 2021-07-08 MED ORDER — DIPHENHYDRAMINE HCL 50 MG/ML IJ SOLN
INTRAMUSCULAR | Status: DC | PRN
  Administered 2021-07-08: 25 mg via INTRAVENOUS

## 2021-07-08 MED ORDER — LACTATED RINGERS IV SOLN: INTRAVENOUS | Status: DC

## 2021-07-08 MED ORDER — FLUTICASONE-UMECLIDIN-VILANT 100-62.5-25 MCG/ACT IN AEPB: 1.0000 | INHALATION_SPRAY | Freq: Every day | RESPIRATORY_TRACT | 3 refills | Status: DC

## 2021-07-08 MED ORDER — CIPROFLOXACIN IN D5W 400 MG/200ML IV SOLN
INTRAVENOUS | Status: DC | PRN
  Administered 2021-07-08: 400 mg via INTRAVENOUS

## 2021-07-08 MED ORDER — ONDANSETRON HCL 4 MG/2ML IJ SOLN
INTRAMUSCULAR | Status: DC | PRN
  Administered 2021-07-08: 4 mg via INTRAVENOUS

## 2021-07-08 MED ORDER — ROCURONIUM BROMIDE 10 MG/ML (PF) SYRINGE
PREFILLED_SYRINGE | INTRAVENOUS | Status: DC | PRN
  Administered 2021-07-08: 50 mg via INTRAVENOUS
  Administered 2021-07-08: 10 mg via INTRAVENOUS

## 2021-07-08 MED ORDER — BISOPROLOL-HYDROCHLOROTHIAZIDE 5-6.25 MG PO TABS: 1.0000 | ORAL_TABLET | Freq: Every day | ORAL | 2 refills | Status: DC

## 2021-07-08 MED ORDER — DEXAMETHASONE SODIUM PHOSPHATE 10 MG/ML IJ SOLN
INTRAMUSCULAR | Status: AC
  Filled 2021-07-08: qty 1

## 2021-07-08 MED ORDER — ASPIRIN 325 MG PO TABS: 325.0000 mg | ORAL_TABLET | Freq: Every day | ORAL | 2 refills | Status: DC

## 2021-07-08 MED ORDER — HYDROMORPHONE HCL 1 MG/ML IJ SOLN
1.0000 mg | INTRAMUSCULAR | Status: DC | PRN
  Administered 2021-07-08: 1 mg via INTRAVENOUS
  Filled 2021-07-08: qty 1

## 2021-07-08 MED ORDER — SUGAMMADEX SODIUM 200 MG/2ML IV SOLN
INTRAVENOUS | Status: DC | PRN
Start: 2021-07-08 — End: 2021-07-08
  Administered 2021-07-08: 120 mg via INTRAVENOUS

## 2021-07-08 MED ORDER — MELOXICAM 15 MG PO TABS: 15.0000 mg | ORAL_TABLET | Freq: Every day | ORAL | 1 refills | Status: DC

## 2021-07-08 MED ORDER — ETOMIDATE 2 MG/ML IV SOLN
INTRAVENOUS | Status: DC | PRN
  Administered 2021-07-08: 18 mg via INTRAVENOUS

## 2021-07-08 MED ORDER — SODIUM CHLORIDE 0.9 % IR SOLN
Status: DC | PRN
  Administered 2021-07-08: 1000 mL

## 2021-07-08 MED ORDER — OXYCODONE HCL 5 MG PO TABS
5.0000 mg | ORAL_TABLET | ORAL | Status: DC | PRN
  Filled 2021-07-08: qty 1

## 2021-07-08 MED ORDER — SCOPOLAMINE 1 MG/3DAYS TD PT72
MEDICATED_PATCH | TRANSDERMAL | Status: AC
  Filled 2021-07-08: qty 1

## 2021-07-08 MED ORDER — SCOPOLAMINE 1 MG/3DAYS TD PT72
1.0000 | MEDICATED_PATCH | Freq: Once | TRANSDERMAL | Status: DC
  Administered 2021-07-08: 1.5 mg via TRANSDERMAL

## 2021-07-08 MED ORDER — MIDAZOLAM HCL 2 MG/2ML IJ SOLN
INTRAMUSCULAR | Status: AC
  Filled 2021-07-08: qty 2

## 2021-07-08 MED ORDER — SODIUM CHLORIDE 0.9 % IV SOLN: INTRAVENOUS | Status: DC

## 2021-07-08 MED ORDER — BUPIVACAINE HCL (PF) 0.5 % IJ SOLN
INTRAMUSCULAR | Status: AC
  Filled 2021-07-08: qty 30

## 2021-07-08 MED ORDER — DIPHENHYDRAMINE HCL 50 MG/ML IJ SOLN
INTRAMUSCULAR | Status: AC
  Filled 2021-07-08: qty 1

## 2021-07-08 MED ORDER — ONDANSETRON HCL 4 MG/2ML IJ SOLN
4.0000 mg | Freq: Once | INTRAMUSCULAR | Status: AC | PRN
  Administered 2021-07-08: 4 mg via INTRAVENOUS
  Filled 2021-07-08: qty 2

## 2021-07-08 MED ORDER — ONDANSETRON HCL 4 MG/2ML IJ SOLN
INTRAMUSCULAR | Status: AC
  Filled 2021-07-08: qty 2

## 2021-07-08 SURGICAL SUPPLY — 39 items
APPLICATOR ARISTA FLEXITIP XL (MISCELLANEOUS) ×1 IMPLANT
APPLIER CLIP ROT 10 11.4 M/L (STAPLE) ×2
BAG RETRIEVAL 10 (BASKET) ×1
BLADE SURG 15 STRL LF DISP TIS (BLADE) ×1 IMPLANT
BLADE SURG 15 STRL SS (BLADE) ×2
CHLORAPREP W/TINT 26 (MISCELLANEOUS) ×2 IMPLANT
CLIP APPLIE ROT 10 11.4 M/L (STAPLE) ×1 IMPLANT
CLOTH BEACON ORANGE TIMEOUT ST (SAFETY) ×2 IMPLANT
COVER LIGHT HANDLE STERIS (MISCELLANEOUS) ×4 IMPLANT
DECANTER SPIKE VIAL GLASS SM (MISCELLANEOUS) ×2 IMPLANT
DERMABOND ADVANCED (GAUZE/BANDAGES/DRESSINGS) ×1
DERMABOND ADVANCED .7 DNX12 (GAUZE/BANDAGES/DRESSINGS) ×1 IMPLANT
ELECT REM PT RETURN 9FT ADLT (ELECTROSURGICAL) ×2
ELECTRODE REM PT RTRN 9FT ADLT (ELECTROSURGICAL) ×1 IMPLANT
GAUZE 4X4 16PLY ~~LOC~~+RFID DBL (SPONGE) ×2 IMPLANT
GLOVE SURG ENC MOIS LTX SZ6.5 (GLOVE) ×2 IMPLANT
GLOVE SURG UNDER POLY LF SZ7 (GLOVE) ×8 IMPLANT
GOWN STRL REUS W/TWL LRG LVL3 (GOWN DISPOSABLE) ×6 IMPLANT
HEMOSTAT SNOW SURGICEL 2X4 (HEMOSTASIS) ×2 IMPLANT
INST SET LAPROSCOPIC AP (KITS) ×2 IMPLANT
KIT TURNOVER KIT A (KITS) ×2 IMPLANT
MANIFOLD NEPTUNE II (INSTRUMENTS) ×2 IMPLANT
NDL INSUFFLATION 14GA 120MM (NEEDLE) ×1 IMPLANT
NEEDLE INSUFFLATION 14GA 120MM (NEEDLE) ×2 IMPLANT
NS IRRIG 1000ML POUR BTL (IV SOLUTION) ×2 IMPLANT
PACK LAP CHOLE LZT030E (CUSTOM PROCEDURE TRAY) ×2 IMPLANT
PAD ARMBOARD 7.5X6 YLW CONV (MISCELLANEOUS) ×2 IMPLANT
SET BASIN LINEN APH (SET/KITS/TRAYS/PACK) ×2 IMPLANT
SET TUBE SMOKE EVAC HIGH FLOW (TUBING) ×2 IMPLANT
SLEEVE ENDOPATH XCEL 5M (ENDOMECHANICALS) ×2 IMPLANT
SUT MNCRL AB 4-0 PS2 18 (SUTURE) ×4 IMPLANT
SUT VICRYL 0 UR6 27IN ABS (SUTURE) ×2 IMPLANT
SYS BAG RETRIEVAL 10MM (BASKET) ×1
SYSTEM BAG RETRIEVAL 10MM (BASKET) ×1 IMPLANT
TROCAR ENDO BLADELESS 11MM (ENDOMECHANICALS) ×2 IMPLANT
TROCAR XCEL NON-BLD 5MMX100MML (ENDOMECHANICALS) ×2 IMPLANT
TROCAR XCEL UNIV SLVE 11M 100M (ENDOMECHANICALS) ×2 IMPLANT
TUBE CONNECTING 12X1/4 (SUCTIONS) ×2 IMPLANT
WARMER LAPAROSCOPE (MISCELLANEOUS) ×2 IMPLANT

## 2021-07-08 NOTE — Anesthesia Postprocedure Evaluation (Signed)
Anesthesia Post Note  Patient: Kristen Ryan  Procedure(s) Performed: LAPAROSCOPIC CHOLECYSTECTOMY (Abdomen)  Patient location during evaluation: Phase II Anesthesia Type: General Level of consciousness: awake Pain management: pain level controlled Vital Signs Assessment: post-procedure vital signs reviewed and stable Respiratory status: spontaneous breathing and respiratory function stable Cardiovascular status: blood pressure returned to baseline and stable Postop Assessment: no headache and no apparent nausea or vomiting Anesthetic complications: no Comments: Late entry   No notable events documented.   Last Vitals:  Vitals:   07/08/21 1045 07/08/21 1115  BP: (!) 154/64 (!) 143/62  Pulse: 64 64  Resp: 13 16  Temp:  36.9 C  SpO2: 95% 94%    Last Pain:  Vitals:   07/08/21 1115  TempSrc: Oral  PainSc:                  Louann Sjogren

## 2021-07-08 NOTE — Progress Notes (Addendum)
Urinary incontinence in bed.  Stood at sink with standby for bath and then linen change.  No nausea or vomiting.  Has been npo since midnight.  Holding 0600 heparin dose and synthroid for surgery.

## 2021-07-08 NOTE — Anesthesia Procedure Notes (Signed)
Procedure Name: Intubation Date/Time: 07/08/2021 8:30 AM Performed by: Cy Blamer, CRNA Pre-anesthesia Checklist: Patient identified, Emergency Drugs available, Suction available and Patient being monitored Patient Re-evaluated:Patient Re-evaluated prior to induction Oxygen Delivery Method: Circle system utilized Preoxygenation: Pre-oxygenation with 100% oxygen Induction Type: IV induction Ventilation: Mask ventilation without difficulty Laryngoscope Size: Miller and 2 Grade View: Grade I Tube type: Oral Tube size: 7.0 mm Number of attempts: 1 Airway Equipment and Method: Stylet and Bite block Placement Confirmation: ETT inserted through vocal cords under direct vision, positive ETCO2 and breath sounds checked- equal and bilateral Tube secured with: Tape Dental Injury: Teeth and Oropharynx as per pre-operative assessment

## 2021-07-08 NOTE — Progress Notes (Signed)
Discharge teaching given and no further questions at this time. Pt wheeled down to main lobby by staff via w/c to vehicle accompanied by husband.

## 2021-07-08 NOTE — Discharge Summary (Signed)
Kristen Ryan, is a 66 y.o. female  DOB [birth year]  MRN [id number].  Admission date:  07/06/2021  Admitting Physician  Neena Rhymes, MD  Discharge Date:  07/08/2021   Primary MD  Valentino Nose, FNP  Recommendations for primary care physician for things to follow:   1) low-fat diet advised 2) please take bisoprolol HCTZ in place of metoprolol 3) please follow-up with general surgeon in a couple of weeks for wound recheck 4) please follow-up with primary care physician for refills on your pain medications   Admission Diagnosis  Acute cholecystitis [K81.0] Cholecystitis, acute [K81.0]   Discharge Diagnosis  Acute cholecystitis [K81.0] Cholecystitis, acute [K81.0]    Principal Problem:   Acute acalculous cholecystitis Active Problems:   Bipolar 1 disorder ([place])   Eosinophilic asthma   COPD with asthma ([place])      Past Medical History:  Diagnosis Date   Anemia    Asthma    Bipolar 1 disorder ([place])    Bronchitis    COPD (chronic obstructive pulmonary disease) ([place])    Diverticulitis    Hypertension    IBS (irritable bowel syndrome)    Migraine    Neuropathy    Schizophrenia ([place])    Thyroid disease     Past Surgical History:  Procedure Laterality Date   ABDOMINAL HYSTERECTOMY     broke left arm     CESAREAN SECTION     COLONOSCOPY N/A 08/30/2014   Procedure: COLONOSCOPY;  Surgeon: Rogene Houston, MD;  Location: [facility];  Service: Endoscopy;  Laterality: N/A;  1200   ELBOW SURGERY     FOOT SURGERY     KNEE SURGERY     TONSILLECTOMY       HPI  from the history and physical done on the day of admission:    Chief Complaint: epigastric pain   HPI: Kristen Ryan is a 66 y.o. female with medical history significant of hypothyroidism, history of hypertension, history of bipolar disorder, asthma, COPD, hypertension, schizophrenia and neuropathy. Starting today she had  "chest pain" located in the epigastrum., is non-radiating. She has had several episodes of N/V. Eating exacerbates her pain. She has no prior h/o cardiac disease, pancreatitis or cholelithiasis. She present to [facility] for evaluation.    ED Course: T 99.3  166/83  HR 79 RR 16  By EDP exam - abdominal tenderness. Lab: ABG 7.35/45/112, Cmet - glucose 121, AST 80, Troponin 4, Lactic acid 1.1, WBC 12.7 w/ 58/22/13. CT abdomen/pelvis with "hydropic" GB with pericholecystic fluid, thickening of GB wall, no gallstones, no dilation CBD. EDP consulted Suella Broad who recommended medical admission, Abx, GB u/s. GS will consult in AM. [facility] called to admit patient.   Review of Systems: As per HPI otherwise 10 point review of systems negative.      Hospital Course:     Brief Narrative:   66 y.o. female with medical history significant of hypothyroidism, history of hypertension, history of bipolar disorder, asthma, COPD, hypertension, schizophrenia and neuropathy admitted  on 07/06/2021 with acute cholecystitis  A/p 1)-Acute acalculous cholecystitis-history, clinical exam,  -CT abdomen pelvis and right upper quadrant ultrasound are all consistent with a calculus cholecystitis -General surgery consult appreciated -Treated with Cipro and Flagyl IV -Patient was also treated with IV Dilaudid and IV Zofran as needed -Status post uneventful lap chole -Tolerating diet okay, discharged home with outpatient follow-up with general surgery    2)Elevated LFTs--- secondary to # 1 above monitor closely -LFTs trending down  Hepatic Function Latest Ref Rng & Units 07/08/2021 07/07/2021 07/06/2021  Total Protein 6.5 - 8.1 g/dL 5.9(L) 5.6(L) 6.7  Albumin 3.5 - 5.0 g/dL 3.3(L) 3.1(L) 4.0  AST 15 - 41 U/L 95(H) 260(H) 80(H)  ALT 0 - 44 U/L 151(H) 223(H) 44  Alk Phosphatase 38 - 126 U/L 113 120 76  Total Bilirubin 0.3 - 1.2 mg/dL 0.6 0.6 0.8  Bilirubin, Direct 0.0 - 0.2 mg/dL - 0.1 0.4(H)     3)HTN--c/n Bisoprolol-HCTZ,  lisinopril,    4) COPD/asthma--no acute flareup, continue bronchodilators and Singulair   5)Constipation--- patient received lactulose and MiraLAX , may continue MiraLAX as advised   6) bipolar disorder-stable, continue Geodon, Trintellix, trazodone nightly as needed   7)Peripheral neuropathy--continue gabapentin-   8) hypothyroidism --- continue levothyroxine   Disposition--Home with husband   Disposition: The patient is from: Home              Anticipated d/c is to: Home                Code Status :  -  Code Status: Full Code    Family Communication:    NA (patient is alert, awake and coherent)    Consults  :  gen Surgery    Discharge Condition: Stable  Follow UP   Follow-up Information     Pappayliou, Catherine A, DO. Call in 2 week(s).   Specialty: General Surgery Why: Call office to make an appointment for follow-up in 2 weeks Contact information: [id number] [place] [facility] [address] [phone]                 Diet and Activity recommendation:  As advised  Discharge Instructions   Discharge Instructions     Call MD for:  difficulty breathing, headache or visual disturbances   Complete by: As directed    Call MD for:  persistant dizziness or light-headedness   Complete by: As directed    Call MD for:  persistant nausea and vomiting   Complete by: As directed    Call MD for:  redness, tenderness, or signs of infection (pain, swelling, redness, odor or green/yellow discharge around incision site)   Complete by: As directed    Call MD for:  severe uncontrolled pain   Complete by: As directed    Call MD for:  temperature >100.4   Complete by: As directed    Diet - low sodium heart healthy   Complete by: As directed    Discharge instructions   Complete by: As directed    1) low-fat diet advised 2) please take bisoprolol HCTZ in place of metoprolol 3) please follow-up with general surgeon in a couple of weeks for wound recheck 4) please  follow-up with primary care physician for refills on your pain medications   Discharge wound care:   Complete by: As directed    Keep postoperative wound clean and dry   Increase activity slowly   Complete by: As directed  Discharge Medications     Allergies as of 07/08/2021       Reactions   Aminophylline Anaphylaxis   Sumatriptan Anaphylaxis, Other (See Comments)   Causes fainting   Theophylline Anaphylaxis   Codeine Itching, Other (See Comments)   Too strong for patient   Depakote [divalproex Sodium] Nausea Only   Divalproex Sodium Other (See Comments)   Causes hair to fall out   Doxycycline Other (See Comments)   headache   Lamictal [lamotrigine]    Destroys thyroid   Lithium Other (See Comments)   Adverse reaction to Thyroid    Magnesium-containing Compounds Hives   Nabumetone Swelling   Naproxen Swelling   Other    Hair dye   Penicillins Hives, Nausea And Vomiting   Has patient had a PCN reaction causing immediate rash, facial/tongue/throat swelling, SOB or lightheadedness with hypotension: Yes Has patient had a PCN reaction causing severe rash involving mucus membranes or skin necrosis: No Has patient had a PCN reaction that required hospitalization Yes Has patient had a PCN reaction occurring within the last 10 years: No If all of the above answers are "NO", then may proceed with Cephalosporin use.   Pentazocine Lactate Other (See Comments)   Unknown reaction   Risperidone Other (See Comments)   Insomnia    Septra Ds [sulfamethoxazole-trimethoprim]    Pt unsure, long time ago   Seroquel [quetiapine] Swelling   Tegretol [carbamazepine] Nausea And Vomiting   Clonidine Derivatives    Dizziness at 0.1 mg   Tetracycline Rash   Headaches   Zyprexa [olanzapine] Rash, Other (See Comments)   Insomnia        Medication List     STOP taking these medications    hydrochlorothiazide 25 MG tablet Commonly known as: HYDRODIURIL   hydrOXYzine 25 MG  tablet Commonly known as: ATARAX   ipratropium-albuterol 0.5-2.5 (3) MG/3ML Soln Commonly known as: DUONEB   levofloxacin 750 MG tablet Commonly known as: LEVAQUIN   loratadine 10 MG tablet Commonly known as: CLARITIN   methocarbamol 500 MG tablet Commonly known as: ROBAXIN   metoprolol tartrate 25 MG tablet Commonly known as: LOPRESSOR   metroNIDAZOLE 500 MG tablet Commonly known as: FLAGYL   promethazine 6.25 MG/5ML syrup Commonly known as: PHENERGAN   sodium chloride 0.65 % Soln nasal spray Commonly known as: OCEAN   triamcinolone ointment 0.1 % Commonly known as: KENALOG       TAKE these medications    acetaminophen 500 MG tablet Commonly known as: TYLENOL Take 1,000 mg by mouth every 6 (six) hours as needed.   Aimovig 70 MG/ML Soaj Generic drug: Erenumab-aooe Inject 70 mg into the skin daily as needed (migraine).   albuterol 108 (90 Base) MCG/ACT inhaler Commonly known as: VENTOLIN HFA Inhale 1-2 puffs into the lungs every 6 (six) hours as needed for wheezing or shortness of breath.   aspirin 325 MG tablet Take 1 tablet (325 mg total) by mouth daily with breakfast. What changed: when to take this   bisoprolol-hydrochlorothiazide 5-6.25 MG tablet Commonly known as: ZIAC Take 1 tablet by mouth daily. -Take in place of metoprolol   calcium-vitamin D 500-200 MG-UNIT tablet Commonly known as: OSCAL WITH D Take 1 tablet by mouth 2 (two) times daily.   diclofenac sodium 1 % Gel Commonly known as: VOLTAREN Apply 4 g topically daily.   DUPIXENT [place] Inject into the skin.   Dupixent 300 MG/2ML Sopn Generic drug: Dupilumab Inject 300 mg into the skin every 14 (fourteen) days.  EPINEPHrine 0.3 mg/0.3 mL Soaj injection Commonly known as: EPI-PEN Inject 0.3 mg into the muscle as needed for anaphylaxis.   ferrous sulfate 325 (65 FE) MG tablet Take 325 mg by mouth 2 (two) times daily before a meal. What changed: Another medication with the same name was  removed. Continue taking this medication, and follow the directions you see here.   Fluticasone-Umeclidin-Vilant 100-62.5-25 MCG/ACT Aepb Commonly known as: Trelegy Ellipta Take 1 spray by mouth daily. What changed: medication strength   gabapentin 300 MG capsule Commonly known as: NEURONTIN Take 300 mg by mouth 2 (two) times daily.   gabapentin 600 MG tablet Commonly known as: NEURONTIN TAKE (1) TABLET BY MOUTH TWICE DAILY.   levothyroxine 50 MCG tablet Commonly known as: SYNTHROID Take 1 tablet by mouth daily. What changed: Another medication with the same name was removed. Continue taking this medication, and follow the directions you see here.   lisinopril 2.5 MG tablet Commonly known as: ZESTRIL Take 2.5 mg by mouth daily.   Magnesium 250 MG Tabs Take 400 mg by mouth every evening.   meclizine 25 MG tablet Commonly known as: ANTIVERT Take 25 mg by mouth 2 (two) times daily as needed.   meloxicam 15 MG tablet Commonly known as: MOBIC Take 1 tablet (15 mg total) by mouth daily with breakfast. What changed: when to take this   montelukast 10 MG tablet Commonly known as: SINGULAIR Take 10 mg by mouth at bedtime. What changed: Another medication with the same name was removed. Continue taking this medication, and follow the directions you see here.   multivitamin with minerals Tabs tablet Take 1 tablet by mouth daily.   omeprazole 40 MG capsule Commonly known as: PRILOSEC Take 40 mg by mouth 2 (two) times daily before a meal. What changed: Another medication with the same name was removed. Continue taking this medication, and follow the directions you see here.   ondansetron 4 MG tablet Commonly known as: ZOFRAN Take 1 tablet (4 mg total) by mouth every 8 (eight) hours as needed. What changed: when to take this   oxyCODONE-acetaminophen 7.5-325 MG tablet Commonly known as: PERCOCET Take 7.5 tablets by mouth every 6 (six) hours as needed. What changed: Another  medication with the same name was removed. Continue taking this medication, and follow the directions you see here.   rosuvastatin 20 MG tablet Commonly known as: CRESTOR Take 20 mg by mouth at bedtime.   vitamin E 180 MG (400 UNITS) capsule Take 400 Units by mouth at bedtime.   vortioxetine HBr 20 MG Tabs tablet Commonly known as: Trintellix Take 1 tablet (20 mg total) by mouth daily.   ziprasidone 80 MG capsule Commonly known as: Geodon Take 1 capsule (80 mg total) by mouth 2 (two) times daily with a meal.               Discharge Care Instructions  (From admission, onward)           Start     Ordered   07/08/21 0000  Discharge wound care:       Comments: Keep postoperative wound clean and dry   07/08/21 1613            Major procedures and Radiology Reports - PLEASE review detailed and final reports for all details, in brief -   DG Chest 2 View  Result Date: 07/06/2021 CLINICAL DATA:  Chest pain. EXAM: CHEST - 2 VIEW COMPARISON:  Chest x-ray 08/23/2015 FINDINGS: The cardiac silhouette,  mediastinal and hilar contours are within normal limits and stable. The lungs are clear of an acute process. No pleural effusions or pulmonary lesions. The bony thorax is intact. Stable midthoracic compression deformities. IMPRESSION: No acute cardiopulmonary findings. Electronically Signed   By: Marijo Sanes M.D.   On: 07/06/2021 15:47   CT Angio Abd/Pel W and/or Wo Contrast  Result Date: 07/06/2021 CLINICAL DATA:  LUQ abdominal pain concern for vascular occlusion - abd pain. sudden onset of upper abdominal/lower mid chest pain radiates to back rated 10/10. BP 218/100. Pt also vomited. EXAM: CTA ABDOMEN AND PELVIS WITH CONTRAST TECHNIQUE: Multidetector CT imaging of the abdomen and pelvis was performed using the standard protocol during bolus administration of intravenous contrast. Delayed images through the abdomen were also obtained. Multiplanar reconstructed images and MIPs were  obtained and reviewed to evaluate the vascular anatomy. CONTRAST:  178m OMNIPAQUE IOHEXOL 350 MG/ML SOLN COMPARISON:  None. FINDINGS: VASCULAR Aorta: Moderate to severe atherosclerotic plaque. Normal caliber aorta without aneurysm, dissection, vasculitis or significant stenosis. Celiac: Patent without evidence of aneurysm, dissection, vasculitis or significant stenosis. SMA: Patent without evidence of aneurysm, dissection, vasculitis or significant stenosis. Renals: Both renal arteries are patent without evidence of aneurysm, dissection, vasculitis, fibromuscular dysplasia or significant stenosis. IMA: Patent without evidence of aneurysm, dissection, vasculitis or significant stenosis. Inflow: At least moderate atherosclerotic plaque. Patent without evidence of aneurysm, dissection, vasculitis or significant stenosis. Proximal Outflow: Bilateral common femoral and visualized portions of the superficial and profunda femoral arteries are patent without evidence of aneurysm, dissection, vasculitis or significant stenosis. Veins: No obvious venous abnormality within the limitations of this arterial phase study. Review of the MIP images confirms the above findings. NON-VASCULAR Lower chest: No acute abnormality. Hepatobiliary: Fluid density 1 cm right hepatic lobe lesion likely represents a hepatic cyst. Vague hypodensity along the left hepatic lobe of unclear etiology (4:40). No focal liver abnormality. No gallstones. Hydropic gallbladder. Question gallbladder wall thickening. There is pericholecystic fluid. No biliary dilatation. Pancreas: No focal lesion. Normal pancreatic contour. No surrounding inflammatory changes. No main pancreatic ductal dilatation. Spleen: Normal in size without focal abnormality. Adrenals/Urinary Tract: No adrenal nodule bilaterally. Bilateral kidneys enhance symmetrically. There is a 2 cm left superior pole simple fluid lesion likely represents a simple renal cyst. Subcentimeter  hypodensities are too small to characterize. No hydronephrosis. No hydroureter. The urinary bladder is unremarkable. Stomach/Bowel: Stomach is within normal limits. No evidence of bowel wall thickening or dilatation. Stool throughout the colon. Colonic diverticulosis. Appendix appears normal. Lymphatic: No lymphadenopathy. Reproductive: Prostate is unremarkable. Other: No intraperitoneal free fluid. No intraperitoneal free gas. No organized fluid collection. Musculoskeletal: No abdominal wall hernia or abnormality. No suspicious lytic or blastic osseous lesions. No acute displaced fracture. IMPRESSION: VASCULAR 1. No acute vascular abnormality. 2.  Aortic Atherosclerosis (ICD10-I70.0). NON-VASCULAR 1. Hydropic gallbladder with associated pericholecystic fluid and query gallbladder wall thickening. Recommend right upper quadrant ultrasound for further evaluation. 2. Constipation. 3. Colonic diverticulosis with no acute diverticulitis. Electronically Signed   By: MIven FinnM.D.   On: 07/06/2021 17:14   UKoreaAbdomen Limited RUQ (LIVER/GB)  Result Date: 07/07/2021 CLINICAL DATA:  Patient presents for left upper quadrant abdominal pain on 07/06/2021 and underwent CT imaging. Gallbladder distension and wall thickening was noted. Follow-up exam. EXAM: ULTRASOUND ABDOMEN LIMITED RIGHT UPPER QUADRANT COMPARISON:  None. FINDINGS: Gallbladder: Distended. Focal wall thickening adjacent to the liver, up to 8 mm. Trace pericholecystic fluid. No stones, however. Patient reportedly tender to transducer pressure over the gallbladder.  Common bile duct: Diameter: 6 mm.  No visualized duct stone. Liver: Borderline to mildly increased parenchymal echogenicity. No mass. Within normal limits in parenchymal echogenicity. Portal vein is patent on color Doppler imaging with normal direction of blood flow towards the liver. Other: None. IMPRESSION: 1. Distended gallbladder with focal wall thickening adjacent to the liver. No  gallstones. Positive sonographic Murphy's sign. Trace pericholecystic fluid. Findings are equivocal for acute cholecystitis, particularly without the presence of gallstones. Focal wall thickening gallbladder could be due to adjacent liver inflammation. 2. Top-normal size of the common bile duct. No evidence of a duct stone. Electronically Signed   By: Lajean Manes M.D.   On: 07/07/2021 11:01    Micro Results   Recent Results (from the past 240 hour(s))  Resp Panel by RT-PCR (Flu A&B, Covid) Nasopharyngeal Swab     Status: None   Collection Time: 07/06/21  6:10 PM   Specimen: Nasopharyngeal Swab; Nasopharyngeal(NP) swabs in vial transport medium  Result Value Ref Range Status   SARS Coronavirus 2 by RT PCR NEGATIVE NEGATIVE Final    Comment: (NOTE) SARS-CoV-2 target nucleic acids are NOT DETECTED.  The SARS-CoV-2 RNA is generally detectable in upper respiratory specimens during the acute phase of infection. The lowest concentration of SARS-CoV-2 viral copies this assay can detect is 138 copies/mL. A negative result does not preclude SARS-Cov-2 infection and should not be used as the sole basis for treatment or other patient management decisions. A negative result may occur with  improper specimen collection/handling, submission of specimen other than nasopharyngeal swab, presence of viral mutation(s) within the areas targeted by this assay, and inadequate number of viral copies(<138 copies/mL). A negative result must be combined with clinical observations, patient history, and epidemiological information. The expected result is Negative.  Fact Sheet for Patients:  EntrepreneurPulse.com.au  Fact Sheet for Healthcare Providers:  IncredibleEmployment.be  This test is no t yet approved or cleared by the [place] FDA and  has been authorized for detection and/or diagnosis of SARS-CoV-2 by FDA under an Emergency Use Authorization (EUA). This EUA  will remain  in effect (meaning this test can be used) for the duration of the COVID-19 declaration under Section 564(b)(1) of the Act, 21 U.S.C.section 360bbb-3(b)(1), unless the authorization is terminated  or revoked sooner.       Influenza A by PCR NEGATIVE NEGATIVE Final   Influenza B by PCR NEGATIVE NEGATIVE Final    Comment: (NOTE) The Xpert Xpress SARS-CoV-2/FLU/RSV plus assay is intended as an aid in the diagnosis of influenza from Nasopharyngeal swab specimens and should not be used as a sole basis for treatment. Nasal washings and aspirates are unacceptable for Xpert Xpress SARS-CoV-2/FLU/RSV testing.  Fact Sheet for Patients: EntrepreneurPulse.com.au  Fact Sheet for Healthcare Providers: IncredibleEmployment.be  This test is not yet approved or cleared by the [place] FDA and has been authorized for detection and/or diagnosis of SARS-CoV-2 by FDA under an Emergency Use Authorization (EUA). This EUA will remain in effect (meaning this test can be used) for the duration of the COVID-19 declaration under Section 564(b)(1) of the Act, 21 U.S.C. section 360bbb-3(b)(1), unless the authorization is terminated or revoked.  Performed at [facility], [address], [place],  [address]   Surgical pcr screen     Status: None   Collection Time: 07/07/21 12:04 AM   Specimen: Nasal Mucosa; Nasal Swab  Result Value Ref Range Status   MRSA, PCR NEGATIVE NEGATIVE Final   Staphylococcus aureus NEGATIVE NEGATIVE Final  Comment: (NOTE) The Xpert SA Assay (FDA approved for NASAL specimens in patients 18 years of age and older), is one component of a comprehensive surveillance program. It is not intended to diagnose infection nor to guide or monitor treatment. Performed at [facility], [address], [place], [place] [address]    Today   Subjective    Kristen Ryan today has no new concerns -Husband at bedside, -Tolerating  oral intake well post-op -Pain control  adequate        No fever  Or chills    Patient has been seen and examined prior to discharge   Objective   Blood pressure (!) 141/80, pulse 68, temperature 98.3 F (36.8 C), temperature source Oral, resp. rate 20, height _0  (1.676 m), weight 60.2 kg, SpO2 98 %.   Intake/Output Summary (Last 24 hours) at 07/08/2021 1614 Last data filed at 07/08/2021 0919 Gross per 24 hour  Intake 1060 ml  Output 10 ml  Net 1050 ml    Exam Gen:- Awake Alert, no acute distress  HEENT:- [place].AT,   Neck-Supple Neck,No JVD,.  Lungs-  CTAB , good air movement bilaterally  CV- S1, S2 normal, regular Abd-  +ve B.Sounds, Abd Soft, appropriate postop tenderness    Extremity/Skin:- No  edema,   good pulses Psych-affect is appropriate, oriented x3 Neuro-no new focal deficits, no tremors    Data Review   CBC w Diff:  Lab Results  Component Value Date   WBC 4.5 07/08/2021   HGB 11.8 (L) 07/08/2021   HCT 37.3 07/08/2021   PLT 160 07/08/2021   LYMPHOPCT 22 12/31/2018   MONOPCT 13 12/31/2018   EOSPCT 5 12/31/2018   BASOPCT 1 12/31/2018    CMP:  Lab Results  Component Value Date   NA 143 07/08/2021   K 4.3 07/08/2021   CL 107 07/08/2021   CO2 28 07/08/2021   BUN 6 (L) 07/08/2021   CREATININE 0.73 07/08/2021   CREATININE 0.76 06/13/2014   PROT 5.9 (L) 07/08/2021   ALBUMIN 3.3 (L) 07/08/2021   BILITOT 0.6 07/08/2021   ALKPHOS 113 07/08/2021   AST 95 (H) 07/08/2021   ALT 151 (H) 07/08/2021  .   Total Discharge time is about 33 minutes  Roxan Hockey M.D on 07/08/2021 at 4:14 PM  Go to [email or web address] -  for contact info  [facility] - Office  [phone]

## 2021-07-08 NOTE — Interval H&P Note (Signed)
History and Physical Interval Note:  07/08/2021 7:58 AM  Kristen Ryan  has presented today for surgery, with the diagnosis of acute cholecystitis.  The various methods of treatment have been discussed with the patient. After consideration of risks, benefits and other options for treatment, the patient has consented to  Procedure(s): LAPAROSCOPIC CHOLECYSTECTOMY, POSSIBLE OPEN (N/A) as a surgical intervention.  The patient's history has been reviewed, patient examined, no change in status, stable for surgery.  I have reviewed the patient's chart and labs.  Questions were answered to the patient's satisfaction.     [place]

## 2021-07-08 NOTE — Transfer of Care (Signed)
Immediate Anesthesia Transfer of Care Note  Patient: Kristen Ryan  Procedure(s) Performed: LAPAROSCOPIC CHOLECYSTECTOMY (Abdomen)  Patient Location: PACU  Anesthesia Type:General  Level of Consciousness: awake, drowsy and patient cooperative  Airway & Oxygen Therapy: Patient Spontanous Breathing and Patient connected to face mask oxygen  Post-op Assessment: Report given to RN, Post -op Vital signs reviewed and stable, Patient moving all extremities X 4 and Patient able to stick tongue midline  Post vital signs: Reviewed  Last Vitals:  Vitals Value Taken Time  BP 166/74 07/08/21 0932  Temp 97.2   Pulse 70 07/08/21 0934  Resp 18 07/08/21 0934  SpO2 96 % 07/08/21 0934  Vitals shown include unvalidated device data.  Last Pain:  Vitals:   07/08/21 0817  TempSrc: Oral  PainSc: 5       Patients Stated Pain Goal: 5 (07/08/21 0817)  Complications: No notable events documented.

## 2021-07-08 NOTE — Anesthesia Preprocedure Evaluation (Signed)
Anesthesia Evaluation  Patient identified by MRN, date of birth, ID band Patient awake    Reviewed: Allergy & Precautions, H&P , NPO status , Patient's Chart, lab work & pertinent test results, reviewed documented beta blocker date and time   Airway Mallampati: II  TM Distance: >3 FB Neck ROM: full    Dental no notable dental hx.    Pulmonary asthma , COPD, former smoker,    Pulmonary exam normal breath sounds clear to auscultation       Cardiovascular Exercise Tolerance: Good hypertension, negative cardio ROS   Rhythm:regular Rate:Normal     Neuro/Psych  Headaches, PSYCHIATRIC DISORDERS Anxiety Bipolar Disorder Schizophrenia    GI/Hepatic negative GI ROS, Neg liver ROS,   Endo/Other  Hypothyroidism   Renal/GU negative Renal ROS  negative genitourinary   Musculoskeletal   Abdominal   Peds  Hematology  (+) Blood dyscrasia, anemia ,   Anesthesia Other Findings   Reproductive/Obstetrics negative OB ROS                             Anesthesia Physical Anesthesia Plan  ASA: 3 and emergent  Anesthesia Plan: General and General ETT   Post-op Pain Management:    Induction:   PONV Risk Score and Plan: Ondansetron and Scopolamine patch - Pre-op  Airway Management Planned:   Additional Equipment:   Intra-op Plan:   Post-operative Plan:   Informed Consent: I have reviewed the patients History and Physical, chart, labs and discussed the procedure including the risks, benefits and alternatives for the proposed anesthesia with the patient or authorized representative who has indicated his/her understanding and acceptance.     Dental Advisory Given  Plan Discussed with: CRNA  Anesthesia Plan Comments:         Anesthesia Quick Evaluation

## 2021-07-08 NOTE — Plan of Care (Signed)

## 2021-07-08 NOTE — Progress Notes (Signed)
Update Note:  Patient's husband, Kristen Ryan, was called and updated on the procedure.  It was explained that his wife did well, and her gallbladder was able to be removed without issue.  All questions were answered.  Kristen Ryan was also updated in PACU, and all questions were answered.  - Patient to return to the floor - Clear liquid diet ordered - May advance to regular diet as tolerated - Pain control PRN - If patient's pain is adequately controlled with PO pain medications and she is able to tolerate a regular diet, she may be discharged home later today vs tomorrow morning - Patient to follow up with me in 2 weeks   Theophilus Kinds, DO [facility] [address] [place]ella Raring [place], [place] [address] [phone] (office)

## 2021-07-08 NOTE — Discharge Instructions (Addendum)
- °  1) low-fat diet advised 2) please take bisoprolol HCTZ in place of metoprolol 3) please follow-up with general surgeon in a couple of weeks for wound recheck 4) please follow-up with primary care physician for refills on your pain medications  - Procedure(s) / Surgeries during current admission: Laparoscopic Cholecystectomy     Discharge to: Home  General Instructions -Do not drive or operate heavy machinery for 24 hours.  -Do not consume alcohol, tranquilizers, sleeping medications, or any non-prescribed medications for 24 hours. -Do not make important decisions or sign any important papers in the next 24 hours. -You should have someone with you tonight at home.  Activity 1. You are advised to go directly home from the hospital.   2. Restrict your activities and rest for a day.   3. Resume light to normal activity tomorrow.  4. You may walk up and down stairs. Walk carefully and use the handrail.  5. No heavy lifting, pushing or pulling greater than 10 pounds for 4 weeks.  Fluids and Diet -Regular, heart healthy diet  Medications - You may resume your daily prescription medication schedule. - DO NOT restart your Aspirin until 07/10/2021 - Constipation may develop as a consequence of narcotic pain medications, anesthesia and/or surgery. Drink plenty of liquids (fruit juice, prune juice, water, etc.). Use may also use over-the-counter stool softeners (colace) or Miralax.    Operative Site 1. Keep dressing clean and dry. 2. There are dissolvable sutures under the skin with surgical glue over top.  The glue will flake off in about 10 days or so 3. You may shower beginning tomorrow.  4. Do not allow incision to immerse in the bathtub, hot tub, or swimming pool for 2 weeks.  5. You may apply ice to surgical site (up to 20 minutes per hour while awake) to decrease swelling.  SPECIFIC COMPLICATIONS TO WATCH FOR: Fever over 101? F by mouth Pain not relieved by medication  ordered Swelling around the operative site (some swelling is normal).  Increased redness, warmth, hardness, around operative area Numb, tingling, or cold fingers or toes Blood -soaked dressing, (small amounts of oozing may be normal) Increasing and progressive drainage from surgical area or exam site Inability to urinate  A follow up call will be attempted by a recovery room nurse in 24 - 48 hours to check on your progress.  If you have any questions call your doctor.  Special Medication Instructions:  - DO NOT restart your Aspirin until 07/10/2021  Contact Information: If you have questions or concerns, please call our office, [phone], Monday- Thursday 8AM-5PM and Friday 8AM-12Noon.  If it is after hours or on the weekend, please call [facility] Main [facility], [phone], and ask to speak to the surgeon on call for Dr. Okey Dupre at [facility].

## 2021-07-08 NOTE — Procedures (Signed)
Operative Note   Preoperative Diagnosis: Acute cholecystitis   Postoperative Diagnosis: Same   Procedure(s) Performed: Laparoscopic cholecystectomy   Surgeon: Theophilus Kinds, DO    Assistants: Franky Macho, MD - assisting secondary to complexity of case   Anesthesia: General endotracheal   Specimens: Gallbladder    Estimated Blood Loss: 10 cc   Blood Replacement: None    Complications: None    Operative Findings: Acute, edematous cholecystitis  Indications: Patient is a 66 year old female who was admitted with concern for acute cholecystitis.  She underwent CT and abdominal US which both demonstrated gallbladder distention and wall thickening, no stones were noted on imaging.  Given her continued RUQ abdominal pain and imaging findings, decision was made to take the patient to the operating room for laparoscopic cholecystectomy.  Informed consent was obtained from the patient and all questions were answered.   Procedure: The patient was taken to the operating room and placed supine. General endotracheal anesthesia was induced. Patient was receiving IV antibiotics on the floor and received her morning dose preoperatively. An orogastric tube positioned to decompress the stomach. The abdomen was prepared and draped in the usual sterile fashion.    A supraumbilical  incision was made and a Veress technique was utilized to achieve pneumoperitoneum to 15 mmHg with carbon dioxide. A 11 mm optiview port was placed through the supraumbilical region, and a 10 mm 0-degree operative laparoscope was introduced. The area underlying the trocar and Veress needle were inspected and without evidence of injury.  Remaining trocars were placed under direct vision. Two 5 mm ports were placed in the right abdomen, between the anterior axillary and midclavicular line.  A final 11 mm port was placed through the mid-epigastrium, near the falciform ligament.    The gallbladder fundus was elevated cephalad  and the infundibulum was retracted to the patient's right. The gallbladder/cystic duct junction was skeletonized. The cystic artery noted in the triangle of Calot and was also skeletonized.  We then continued liberal medial and lateral dissection until the critical view of safety was achieved.    The cystic duct and cystic artery were doubly clipped and divided. The gallbladder was then dissected from the liver bed with electrocautery. The specimen was placed in an Endopouch and was retrieved through the epigastric site.   Final inspection revealed acceptable hemostasis. SURGICEL powder and Surgical SNOW were placed in the gallbladder bed.  Trocars were removed under direct visualization, and pneumoperitoneum was released.  0 Vicryl fascial sutures were used to close the epigastric and umbilical port sites. Skin incisions were closed with 4-0 Monocryl subcuticular sutures and Dermabond. The patient was awakened from anesthesia and extubated without complication. Dr. Lovell Sheehan assisted with the case secondary to complexity.    Theophilus Kinds, DO  [facility] [address] [place] Raring [place], [place] [address] [phone] (office)

## 2021-07-09 ENCOUNTER — Encounter (HOSPITAL_COMMUNITY): Payer: Self-pay | Admitting: Surgery

## 2021-07-09 LAB — SURGICAL PATHOLOGY

## 2021-07-15 ENCOUNTER — Other Ambulatory Visit: Payer: Self-pay

## 2021-07-15 ENCOUNTER — Ambulatory Visit (INDEPENDENT_AMBULATORY_CARE_PROVIDER_SITE_OTHER): Payer: Medicare Other | Admitting: Licensed Clinical Social Worker

## 2021-07-15 DIAGNOSIS — Z634 Disappearance and death of family member: Secondary | ICD-10-CM

## 2021-07-15 DIAGNOSIS — F3132 Bipolar disorder, current episode depressed, moderate: Secondary | ICD-10-CM

## 2021-07-15 NOTE — Progress Notes (Signed)
Virtual Visit via Audio Note  I connected with Kristen Ryan on 07/15/21 at  9:00 AM EST by an audio enabled telemedicine application and verified that I am speaking with the correct person using two identifiers.  Location: Patient: home Provider: remote office [place], [place])   I discussed the limitations of evaluation and management by telemedicine and the availability of in person appointments. The patient expressed understanding and agreed to proceed.  I discussed the assessment and treatment plan with the patient. The patient was provided an opportunity to ask questions and all were answered. The patient agreed with the plan and demonstrated an understanding of the instructions.   The patient was advised to call back or seek an in-person evaluation if the symptoms worsen or if the condition fails to improve as anticipated.  I provided 60 minutes of non-face-to-face time during this encounter.   Kristen Ryan R Kirsten Spearing, LCSW  THERAPIST PROGRESS NOTE  Session Time: 9-10a  Participation Level: Active  Behavioral Response: Neat and Well GroomedAlertDepressed  Type of Therapy: Individual Therapy  Treatment Goals addressed:  Goal: LTG: Stabilize mood and increase goal-directed behavior: Input needed on appropriate metric Outcome: Not Progressing Note: Pt reports that she doesn't feel she has progressed a lot due to acute illness (ruptured gall bladder).  Goal: STG: @PREFFIRSTNAME @ will attend at least 80% of scheduled follow-up medication management appointments Outcome: Progressing Note:Pt on time for appointment and engaged well throughout session  Interventions:  Intervention: Provide grief and bereavement support  Intervention: Encourage family support  Intervention: Encourage self-care activities  Intervention: Encourage compliance with prescribed medication regimen  Summary: Kristen Ryan is a 66 y.o. female who presents with continuing symptoms related to  depression diagnosis. Pt reports that she is compliant with medication. Pt reports that overall mood has been fluctuating due to acute stressor--recent gall bladder rupture. Pt reports that now that she is recovering from surgery, she is getting good quality and quantity of sleep.   Discussed death of father and allowed pt safe space to explore thoughts/feelings about his death and how its hard for pt to find closure.  Reviewed coping skills for managing stress/depression/anxiety.   Continued recommendations are as follows: self care behaviors, positive social engagements, focusing on overall work/home/life balance, and focusing on positive physical and emotional wellness.   Suicidal/Homicidal: No  Therapist Response: Pt has not progressed a lot due to recent acute illness. Pt to prioritize recovery then continue focus on emotional wellness. Treatment to continue as indicated.   Plan: Return again in 4 weeks. Encouraged pt to call PCP because she still has a lot of pneumonia symptoms.   Diagnosis: Axis I: Bipolar disorder, depressed episode; bereavement    Axis II: No diagnosis    76 Kristen Froelich, LCSW 07/15/2021

## 2021-07-15 NOTE — Plan of Care (Signed)
°  Problem: Alleviate depressive/manic symptoms and return to improved levels of effective functioning. Goal: LTG: Stabilize mood and increase goal-directed behavior: Input needed on appropriate metric Outcome: Not Progressing Note: Pt reports that she doesn't feel she has progressed a lot due to acute illness (ruptured gall bladder). Goal: STG: @PREFFIRSTNAME @ will attend at least 80% of scheduled follow-up medication management appointments Outcome: Progressing Intervention: Provide grief and bereavement support Intervention: Encourage family support Intervention: Encourage self-care activities Intervention: Encourage compliance with prescribed medication regimen

## 2021-07-22 ENCOUNTER — Ambulatory Visit (INDEPENDENT_AMBULATORY_CARE_PROVIDER_SITE_OTHER): Payer: Medicare Other | Admitting: Surgery

## 2021-07-22 DIAGNOSIS — Z09 Encounter for follow-up examination after completed treatment for conditions other than malignant neoplasm: Secondary | ICD-10-CM

## 2021-07-22 NOTE — Progress Notes (Signed)
[facility]  I am calling the patient for post operative evaluation. This is not a billable encounter as it is under the [place] charges for the surgery.  The patient had a laparoscopic cholecystectomy on 07/08/2021. The patient reports that she is doing well.  Her only complaint is some pain associated with her umbilical incision, but this is controlled with tylenol. She is tolerating a diet without issues, having good pain control, and having regular bowel movements.  The incisions are healing well.  The skin glue has peeled off without issue. She has no other concerns at this time.  All of her questions were answered to her satisfaction.  Pathology: FINAL MICROSCOPIC DIAGNOSIS:   A. GALLBLADDER, CHOLECYSTECTOMY:  - Chronic cholecystitis  Will see the patient PRN.   Graciella Freer, DO [facility] [address] [address] [place], [place] [address] [phone] (office)

## 2021-08-08 ENCOUNTER — Other Ambulatory Visit (HOSPITAL_COMMUNITY): Payer: Self-pay | Admitting: Family Medicine

## 2021-08-08 ENCOUNTER — Other Ambulatory Visit: Payer: Self-pay

## 2021-08-08 ENCOUNTER — Ambulatory Visit (HOSPITAL_COMMUNITY)
Admission: RE | Admit: 2021-08-08 | Discharge: 2021-08-08 | Disposition: A | Discharge status: Home / Self Care | Payer: Medicare Other | Source: Ambulatory Visit | Attending: Family Medicine | Admitting: Family Medicine

## 2021-08-08 DIAGNOSIS — R079 Chest pain, unspecified: Secondary | ICD-10-CM | POA: Insufficient documentation

## 2021-08-08 DIAGNOSIS — M4316 Spondylolisthesis, lumbar region: Secondary | ICD-10-CM | POA: Insufficient documentation

## 2021-08-08 DIAGNOSIS — M545 Low back pain, unspecified: Secondary | ICD-10-CM | POA: Diagnosis present

## 2021-08-08 DIAGNOSIS — M47814 Spondylosis without myelopathy or radiculopathy, thoracic region: Secondary | ICD-10-CM | POA: Insufficient documentation

## 2021-08-08 DIAGNOSIS — M47816 Spondylosis without myelopathy or radiculopathy, lumbar region: Secondary | ICD-10-CM | POA: Insufficient documentation

## 2021-08-08 DIAGNOSIS — M546 Pain in thoracic spine: Secondary | ICD-10-CM | POA: Diagnosis not present

## 2021-08-08 DIAGNOSIS — M438X4 Other specified deforming dorsopathies, thoracic region: Secondary | ICD-10-CM | POA: Insufficient documentation

## 2021-08-08 DIAGNOSIS — W19XXXA Unspecified fall, initial encounter: Secondary | ICD-10-CM

## 2021-08-19 ENCOUNTER — Ambulatory Visit (INDEPENDENT_AMBULATORY_CARE_PROVIDER_SITE_OTHER): Payer: Self-pay | Admitting: Licensed Clinical Social Worker

## 2021-08-19 ENCOUNTER — Other Ambulatory Visit: Payer: Self-pay

## 2021-08-19 ENCOUNTER — Telehealth: Payer: Self-pay | Admitting: Licensed Clinical Social Worker

## 2021-08-19 DIAGNOSIS — Z91199 Patient's noncompliance with other medical treatment and regimen due to unspecified reason: Secondary | ICD-10-CM

## 2021-08-19 NOTE — Telephone Encounter (Signed)
LCSW counselor tried to connect with patient for scheduled appointment via MyChart phone X 2 without success. LCSW counselor left message for patient to call office number to reschedule OPT appointment.    Attempt 1: 4:06PM   Attempt 2: 4:15 PM

## 2021-08-19 NOTE — Progress Notes (Signed)
LCSW counselor tried to connect with patient for scheduled appointment via [place] phone X 2 without success. LCSW counselor left message for patient to call office number to reschedule OPT appointment.   Attempt 1: 4:06PM  Attempt 2: 4:15 PM   Appointment will be coded as a no show

## 2021-08-20 ENCOUNTER — Encounter: Payer: Self-pay | Admitting: Licensed Clinical Social Worker

## 2021-09-01 ENCOUNTER — Telehealth: Payer: Self-pay

## 2021-09-01 NOTE — Telephone Encounter (Signed)
Returned pt phone call with no answer. Left message containing local crisis resources--encouraged pt to walk in at [facility] in [facility] during their hours or to go to [facility] in [place] and that they are open 24/7.  Informed that pt would be put on call list if cancellation/no show she will be contacted.  Also encouraged pt to reach out to psychiatric prescriber if she is having an escalation in depression symptoms. Left office phone number in case pt has questions.

## 2021-09-01 NOTE — Telephone Encounter (Signed)
pt called states she needed to speak with you that she very depressed. i made her an appt for next available but it is in april

## 2021-10-10 ENCOUNTER — Ambulatory Visit (INDEPENDENT_AMBULATORY_CARE_PROVIDER_SITE_OTHER): Payer: Medicare Other | Admitting: Licensed Clinical Social Worker

## 2021-10-10 DIAGNOSIS — F411 Generalized anxiety disorder: Secondary | ICD-10-CM

## 2021-10-10 DIAGNOSIS — F3132 Bipolar disorder, current episode depressed, moderate: Secondary | ICD-10-CM

## 2021-10-10 NOTE — Plan of Care (Signed)
?  Problem: Alleviate depressive/manic symptoms and return to improved levels of effective functioning. 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  ?Intervention: REVIEW PLEASE SKILLS (TREAT PHYSICAL ILLNESS, BALANCE EATING, AVOID MOOD-ALTERING SUBSTANCES, BALANCE SLEEP AND GET EXERCISE) WITH [place] ?  ?

## 2021-10-10 NOTE — Progress Notes (Signed)
Virtual Visit via Video  Note ? ?I connected with Kristen Ryan on 10/10/21 at  9:00 AM EDT by a video  enabled telemedicine application and verified that I am speaking with the correct person using two identifiers. ? ?Location: ?Patient: home ?Provider: remote office [place], [place]) ?  ?I discussed the limitations of evaluation and management by telemedicine and the availability of in person appointments. The patient expressed understanding and agreed to proceed. ?  ?I discussed the assessment and treatment plan with the patient. The patient was provided an opportunity to ask questions and all were answered. The patient agreed with the plan and demonstrated an understanding of the instructions. ?  ?The patient was advised to call back or seek an in-person evaluation if the symptoms worsen or if the condition fails to improve as anticipated. ? ?I provided 45 minutes of non-face-to-face time during this encounter. ? ? ?Kristen Avellino R Virginio Isidore, Kristen Ryan ? ? ?THERAPIST PROGRESS NOTE ? ?Session Time: 7-371G ? ?Participation Level: Active ? ?Behavioral Response: NAAlertAnxious and Dysphoric ? ?Type of Therapy: Individual Therapy ? ?Treatment Goals addressed: Problem: Alleviate depressive/manic symptoms and return to improved levels of effective functioning. 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  ?Intervention: REVIEW PLEASE SKILLS (TREAT PHYSICAL ILLNESS, BALANCE EATING, AVOID MOOD-ALTERING SUBSTANCES, BALANCE SLEEP AND GET EXERCISE) WITH [place] ?  ? ?ProgressTowards Goals:  Met ? ?Interventions: CBT ? ?Summary: Kristen Ryan is a 66 y.o. female who presents with  continuing symptoms related to bipolar disorder.   ? ?Patient reports that she is continuing to struggle with her overall depression symptoms. Patient reports that she often stays in her bedroom which is dark most hours of the day. Encourage patient to get up, get dressed, and get out of her bedroom on a daily basis. Encouraged as much activity as physically possible. Encourage patient to open blinds in her home to let the daylight in. Patient disclosed specific phobia related to getting into the shower. Used CBD based ERP to develop systematic desensitization. Discussed patient bringing her dogs into the bathroom with her to give her a sense of safety, and allowing the shower to run in the background while patient gets dressed and prepared as usual. Instructed patient to do this for two weeks, then take a step towards getting into the shower and staying into the shower for increasingly longer periods of time. Patient reports that typically she has to have another person in her presence to be able to take a shower. Patient's goal in this situation is to be able to shower independently  ? ?Pt reports that she is hearing voices again--her prescriber is giving her Vraylar. PCP prescriber. Have appt on the 10th to see a psychiatrist at [facility] in [place] on the 10th.  ? ?Continued recommendations are as follows: self care behaviors, positive social engagements, focusing on overall work/home/life balance, and focusing on positive physical and emotional wellness.  ? ?Suicidal/Homicidal: No ? ?Therapist Response: Pt is continuing to apply interventions learned in session into daily life situations. Pt is currently on track to meet goals utilizing interventions mentioned above. Personal growth and progress noted. Treatment to continue as indicated.  ?  ?Pt presents with very dysphoric, anxious affect  throughout  session. ? ?Plan: Return again in 4 weeks. ? ?Diagnosis: Bipolar 1 disorder, depressed, moderate ([place]) ? ?Generalized anxiety disorder ? ?Collaboration of Care: Other pt reports PCP managing medications--encouraged pt to follow up with prescribers ? ?Patient/Guardian was advised Release of Information must be obtained prior to any record release in order to collaborate their care with an outside provider. Patient/Guardian was advised if they have not already done so to contact the registration department to sign all necessary forms in order for [place] to release information regarding their care.  ? ?Consent: Patient/Guardian gives verbal consent for treatment and assignment of benefits for services provided during this visit. Patient/Guardian expressed understanding and agreed to proceed.  ? ?[place] Lewis, Kristen Ryan ?10/10/2021 ? ?

## 2021-10-16 DIAGNOSIS — J449 Chronic obstructive pulmonary disease, unspecified: Secondary | ICD-10-CM | POA: Diagnosis not present

## 2021-10-21 DIAGNOSIS — M542 Cervicalgia: Secondary | ICD-10-CM | POA: Diagnosis not present

## 2021-11-05 DIAGNOSIS — Z681 Body mass index (BMI) 19 or less, adult: Secondary | ICD-10-CM | POA: Diagnosis not present

## 2021-11-05 DIAGNOSIS — M542 Cervicalgia: Secondary | ICD-10-CM | POA: Diagnosis not present

## 2021-11-15 DIAGNOSIS — J449 Chronic obstructive pulmonary disease, unspecified: Secondary | ICD-10-CM | POA: Diagnosis not present

## 2021-11-18 ENCOUNTER — Other Ambulatory Visit (HOSPITAL_COMMUNITY): Payer: Self-pay | Admitting: Family Medicine

## 2021-11-18 DIAGNOSIS — R509 Fever, unspecified: Secondary | ICD-10-CM | POA: Diagnosis not present

## 2021-11-18 DIAGNOSIS — G4710 Hypersomnia, unspecified: Secondary | ICD-10-CM | POA: Diagnosis not present

## 2021-11-18 DIAGNOSIS — J069 Acute upper respiratory infection, unspecified: Secondary | ICD-10-CM

## 2021-11-18 DIAGNOSIS — T148XXA Other injury of unspecified body region, initial encounter: Secondary | ICD-10-CM | POA: Diagnosis not present

## 2021-11-25 ENCOUNTER — Ambulatory Visit (INDEPENDENT_AMBULATORY_CARE_PROVIDER_SITE_OTHER): Payer: Medicare Other | Admitting: Licensed Clinical Social Worker

## 2021-11-25 DIAGNOSIS — F3132 Bipolar disorder, current episode depressed, moderate: Secondary | ICD-10-CM | POA: Diagnosis not present

## 2021-11-25 DIAGNOSIS — F411 Generalized anxiety disorder: Secondary | ICD-10-CM

## 2021-11-25 NOTE — Progress Notes (Signed)
Virtual Visit via Audio Note  I connected with Kristen Ryan on 11/25/21 at  9:00 AM EDT by an audio enabled telemedicine application and verified that I am speaking with the correct person using two identifiers.  Location: Patient: home Provider: remote office [place], [place])   I discussed the limitations of evaluation and management by telemedicine and the availability of in person appointments. The patient expressed understanding and agreed to proceed.   I discussed the assessment and treatment plan with the patient. The patient was provided an opportunity to ask questions and all were answered. The patient agreed with the plan and demonstrated an understanding of the instructions.   The patient was advised to call back or seek an in-person evaluation if the symptoms worsen or if the condition fails to improve as anticipated.  I provided 17 minutes of non-face-to-face time during this encounter.   Kristen Ryan R Kristen Bergland, LCSW   THERAPIST PROGRESS NOTE  Session Time: [phone]  Participation Level: Minimal--pt sick w/ pneumonia and engaged as best as she could   Behavioral Response: NAAlertAnxious and Dysphoric  Type of Therapy: Individual Therapy  Treatment Goals addressed: Problem: Alleviate depressive/manic symptoms and return to improved levels of effective functioning. Goal: LTG: Stabilize mood and increase goal-directed behavior: Input needed on appropriate metric Outcome: Not Applicable Note: Pt sick--difficult to assess mood Goal: STG: @PREFFIRSTNAME @ will attend at least 80% of scheduled follow-up medication management appointments Outcome: Progressing Note: Pt did show for appt--late, but pt made effort to show  ProgressTowards Goals: Progressing  Interventions: Supportive  Summary: Kristen Ryan is a 66 y.o. female who presents with  continuing symptoms related to bipolar disorder.    Pt reports that she currently has pneumonia and has been running a fever  for 3 weeks. Pt reports that her husband 71 has covid and has been living in another part of the house from her since they have both been sick. Pt reports that her son has been caregiver to both and has been doing a great job.   Reviewed strategies to manage depression and anxiety symptoms.   Continued recommendations are as follows: self care behaviors, positive social engagements, focusing on overall work/home/life balance, and focusing on positive physical and emotional wellness.   Suicidal/Homicidal: No  Therapist Response: Limited engagement due to acute illness. Supported pt at her current energy level. Pt aware of which interventions to employ if she starts to feel depressed after being homebound for extended periods of time.  Plan: Return again in 4 weeks.  Diagnosis: Bipolar 1 disorder, depressed, moderate (HCC)  Generalized anxiety disorder  Collaboration of Care: Other pt reports PCP managing medications--Pt states that she has found new psychiatrist in [place], Dr. May  Patient/Guardian was advised Release of Information must be obtained prior to any record release in order to collaborate their care with an outside provider. Patient/Guardian was advised if they have not already done so to contact the registration department to sign all necessary forms in order for [place] to release information regarding their care.   Consent: Patient/Guardian gives verbal consent for treatment and assignment of benefits for services provided during this visit. Patient/Guardian expressed understanding and agreed to proceed.   Kristen Alphus Zeck, LCSW 11/25/2021

## 2021-11-25 NOTE — Plan of Care (Signed)
  Problem: Alleviate depressive/manic symptoms and return to improved levels of effective functioning. Goal: LTG: Stabilize mood and increase goal-directed behavior: Input needed on appropriate metric Outcome: Not Applicable Note: Pt sick--difficult to assess mood Goal: STG: @PREFFIRSTNAME @ will attend at least 80% of scheduled follow-up medication management appointments Outcome: Progressing Note: Pt did show for appt--late, but pt made effort to show

## 2021-12-16 DIAGNOSIS — J449 Chronic obstructive pulmonary disease, unspecified: Secondary | ICD-10-CM | POA: Diagnosis not present

## 2021-12-29 DIAGNOSIS — M542 Cervicalgia: Secondary | ICD-10-CM | POA: Diagnosis not present

## 2022-01-08 ENCOUNTER — Telehealth (HOSPITAL_COMMUNITY): Payer: Self-pay | Admitting: Licensed Clinical Social Worker

## 2022-01-08 ENCOUNTER — Ambulatory Visit (HOSPITAL_COMMUNITY): Payer: Medicare Other | Admitting: Licensed Clinical Social Worker

## 2022-01-08 NOTE — Telephone Encounter (Signed)
LCSW counselor tried to connect with patient for scheduled appointment via MyChart video text request x 2 and email request; also tried to connect via phone without success. LCSW counselor left message for patient to call office number to reschedule OPT appointment.   Epic outage at the same time as pt scheduled appointment--unsure whether this is a cause of communication issue or not.

## 2022-01-08 NOTE — Progress Notes (Signed)
LCSW counselor tried to connect with patient for scheduled appointment via MyChart video text request x 2 and email request; also tried to connect via phone without success. LCSW counselor left message for patient to call office number to reschedule OPT appointment.   Epic outage at the same time as pt scheduled appointment--unsure whether this is a cause of communication issue or not. 

## 2022-01-15 DIAGNOSIS — J449 Chronic obstructive pulmonary disease, unspecified: Secondary | ICD-10-CM | POA: Diagnosis not present

## 2022-01-20 DIAGNOSIS — E039 Hypothyroidism, unspecified: Secondary | ICD-10-CM | POA: Diagnosis not present

## 2022-01-20 DIAGNOSIS — I10 Essential (primary) hypertension: Secondary | ICD-10-CM | POA: Diagnosis not present

## 2022-01-23 ENCOUNTER — Institutional Professional Consult (permissible substitution): Payer: Medicare Other | Admitting: Internal Medicine

## 2022-01-27 ENCOUNTER — Other Ambulatory Visit (HOSPITAL_COMMUNITY): Payer: Self-pay | Admitting: Family Medicine

## 2022-01-27 DIAGNOSIS — Z1231 Encounter for screening mammogram for malignant neoplasm of breast: Secondary | ICD-10-CM

## 2022-01-27 DIAGNOSIS — G894 Chronic pain syndrome: Secondary | ICD-10-CM | POA: Diagnosis not present

## 2022-01-27 DIAGNOSIS — Z0001 Encounter for general adult medical examination with abnormal findings: Secondary | ICD-10-CM | POA: Diagnosis not present

## 2022-01-27 DIAGNOSIS — G43909 Migraine, unspecified, not intractable, without status migrainosus: Secondary | ICD-10-CM | POA: Diagnosis not present

## 2022-01-27 DIAGNOSIS — F5000 Anorexia nervosa, unspecified: Secondary | ICD-10-CM | POA: Insufficient documentation

## 2022-01-27 DIAGNOSIS — M542 Cervicalgia: Secondary | ICD-10-CM | POA: Diagnosis not present

## 2022-01-27 DIAGNOSIS — R110 Nausea: Secondary | ICD-10-CM | POA: Diagnosis not present

## 2022-01-27 DIAGNOSIS — R634 Abnormal weight loss: Secondary | ICD-10-CM

## 2022-01-27 DIAGNOSIS — E782 Mixed hyperlipidemia: Secondary | ICD-10-CM | POA: Diagnosis not present

## 2022-01-27 DIAGNOSIS — E039 Hypothyroidism, unspecified: Secondary | ICD-10-CM | POA: Diagnosis not present

## 2022-02-15 DIAGNOSIS — J449 Chronic obstructive pulmonary disease, unspecified: Secondary | ICD-10-CM | POA: Diagnosis not present

## 2022-02-17 ENCOUNTER — Ambulatory Visit (INDEPENDENT_AMBULATORY_CARE_PROVIDER_SITE_OTHER): Payer: Medicare Other | Admitting: Licensed Clinical Social Worker

## 2022-02-17 DIAGNOSIS — F3132 Bipolar disorder, current episode depressed, moderate: Secondary | ICD-10-CM

## 2022-02-17 DIAGNOSIS — F411 Generalized anxiety disorder: Secondary | ICD-10-CM | POA: Diagnosis not present

## 2022-02-17 DIAGNOSIS — Z634 Disappearance and death of family member: Secondary | ICD-10-CM

## 2022-02-17 NOTE — Progress Notes (Unsigned)
LVirtual Visit via Audio Note  I connected with Kristen Ryan on 02/17/22 at  8:00 AM EDT by an audio enabled telemedicine application and verified that I am speaking with the correct person using two identifiers.  Location: Patient: home Provider: remote office [place], [place])   I discussed the limitations of evaluation and management by telemedicine and the availability of in person appointments. The patient expressed understanding and agreed to proceed.   I discussed the assessment and treatment plan with the patient. The patient was provided an opportunity to ask questions and all were answered. The patient agreed with the plan and demonstrated an understanding of the instructions.   The patient was advised to call back or seek an in-person evaluation if the symptoms worsen or if the condition fails to improve as anticipated.  I provided 40 minutes of non-face-to-face time during this encounter.   Mirren Gest R Linken Mcglothen, LCSW   THERAPIST PROGRESS NOTE  Session Time: 8:15-855a  Participation Level: Active-  Behavioral Response: NAAlertAnxious and Dysphoric  Type of Therapy: Individual Therapy  Treatment Goals addressed:  Problem: Depression  Goal: Decrease depressive symptoms and improve levels of effective functioning-pt reports a decrease in overall depression symptoms 3 out of 5 sessions documented.  Outcome: Not Progressing Note: Pt feels more depressed after recent losses of two pets Goal: Develop healthy thinking patterns and beliefs about self, others, and the world that lead to the alleviation and help prevent the relapse of depression per self report 3 out of 5 sessions documented.   Outcome: Progressing Goal: Reduce overall frequency, intensity, and duration of the anxiety so that daily functioning is not impaired per pt self report 3 out of 5 sessions documented.   Outcome: Not Progressing Intervention: Encourage self-care activities Note: Reviewed   Intervention: Encourage verbalization of feelings, perceptions, fears, stressors, loss of loved one Note: Allowed pt safe space to explore thoughts/feelings Intervention: Discuss self-management skills Note: Discussed: increasing physical activity, being mindful of overall wellness behaviors, setting boundaries with others Intervention: REVIEW PLEASE SKILLS (TREAT PHYSICAL ILLNESS, BALANCE EATING, AVOID MOOD-ALTERING SUBSTANCES, BALANCE SLEEP AND GET EXERCISE) WITH Kristen Ryan Note: Reviewed     ProgressTowards Goals: Progressing  Interventions: Supportive  Summary: Kristen Ryan is a 66 y.o. female who presents with  continuing symptoms related to bipolar disorder.    Allowed pt to explore and express thoughts and feelings associated with recent life situations and external stressors. Discussed pts recent loss of two pets: cat and a dog. Discussed overall psychological impact of the losses. Triggered continued feelings associated with loss of father.   Explored pts thoughts/feelings associated with her marriage. Pt feels her husband is not understandable and does not make choices that include spending quality time with her "a lot of the time". Pt reflected several incidents that have occurred over time that was triggering to her.   Explored relationships--several friendships that have left pt feeling "ghosted" by her friends.   Continued recommendations are as follows: self care behaviors, positive social engagements, focusing on overall work/home/life balance, and focusing on positive physical and emotional wellness.   Suicidal/Homicidal: No  Therapist Response: Pt is continuing to apply interventions learned in session into daily life situations. Pt is currently on track to meet goals utilizing interventions mentioned above. Personal growth and progress noted. Treatment to continue as indicated.    Plan: Return again in 4 weeks.  Diagnosis:  Encounter Diagnoses  Name Primary?    Bipolar 1 disorder, depressed, moderate (HCC) Yes   Generalized  anxiety disorder    Bereavement    Collaboration of Care: Other pt reports PCP managing medications--Pt states that she has found new psychiatrist in [place], Dr. May  Patient/Guardian was advised Release of Information must be obtained prior to any record release in order to collaborate their care with an outside provider. Patient/Guardian was advised if they have not already done so to contact the registration department to sign all necessary forms in order for [place] to release information regarding their care.   Consent: Patient/Guardian gives verbal consent for treatment and assignment of benefits for services provided during this visit. Patient/Guardian expressed understanding and agreed to proceed.   Ernest Haber Andrea Ferrer, LCSW 02/17/2022

## 2022-02-18 NOTE — Plan of Care (Signed)
  Problem: Depression  Goal: Decrease depressive symptoms and improve levels of effective functioning-pt reports a decrease in overall depression symptoms 3 out of 5 sessions documented.  Outcome: Not Progressing Note: Pt feels more depressed after recent losses of two pets Goal: Develop healthy thinking patterns and beliefs about self, others, and the world that lead to the alleviation and help prevent the relapse of depression per self report 3 out of 5 sessions documented.   Outcome: Progressing Goal: Reduce overall frequency, intensity, and duration of the anxiety so that daily functioning is not impaired per pt self report 3 out of 5 sessions documented.   Outcome: Not Progressing Intervention: Encourage self-care activities Note: Reviewed  Intervention: Encourage verbalization of feelings, perceptions, fears, stressors, loss of loved one Note: Allowed pt safe space to explore thoughts/feelings Intervention: Discuss self-management skills Note: Discussed: increasing physical activity, being mindful of overall wellness behaviors, setting boundaries with others Intervention: REVIEW PLEASE SKILLS (TREAT PHYSICAL ILLNESS, BALANCE EATING, AVOID MOOD-ALTERING SUBSTANCES, BALANCE SLEEP AND GET EXERCISE) WITH Kynadie Note: Reviewed

## 2022-03-02 ENCOUNTER — Encounter (HOSPITAL_COMMUNITY): Payer: Self-pay

## 2022-03-02 ENCOUNTER — Ambulatory Visit (HOSPITAL_COMMUNITY): Admission: RE | Admit: 2022-03-02 | Payer: Medicare Other | Source: Ambulatory Visit

## 2022-03-04 DIAGNOSIS — G43909 Migraine, unspecified, not intractable, without status migrainosus: Secondary | ICD-10-CM | POA: Diagnosis not present

## 2022-03-05 ENCOUNTER — Institutional Professional Consult (permissible substitution): Payer: Medicare Other | Admitting: Internal Medicine

## 2022-03-05 DIAGNOSIS — N1832 Chronic kidney disease, stage 3b: Secondary | ICD-10-CM | POA: Diagnosis not present

## 2022-03-05 DIAGNOSIS — I129 Hypertensive chronic kidney disease with stage 1 through stage 4 chronic kidney disease, or unspecified chronic kidney disease: Secondary | ICD-10-CM | POA: Diagnosis not present

## 2022-03-05 DIAGNOSIS — J449 Chronic obstructive pulmonary disease, unspecified: Secondary | ICD-10-CM | POA: Diagnosis not present

## 2022-03-05 DIAGNOSIS — E782 Mixed hyperlipidemia: Secondary | ICD-10-CM | POA: Diagnosis not present

## 2022-03-05 NOTE — Progress Notes (Deleted)
Kristen Ryan, female    DOB: [birth year]    MRN: [id number]   Brief patient profile:  ***  yo*** *** referred to pulmonary clinic in [place]  03/05/2022 by *** for ***      History of Present Illness  03/05/2022  Pulmonary/ 1st office eval/ Sherene Sires /  Office  No chief complaint on file.    Dyspnea:  *** Cough: *** Sleep: *** SABA use:   Past Medical History:  Diagnosis Date   Anemia    Asthma    Bipolar 1 disorder (HCC)    Bronchitis    COPD (chronic obstructive pulmonary disease) (HCC)    Diverticulitis    Hypertension    IBS (irritable bowel syndrome)    Migraine    Neuropathy    Schizophrenia (HCC)    Thyroid disease     Outpatient Medications Prior to Visit  Medication Sig Dispense Refill   acetaminophen (TYLENOL) 500 MG tablet Take 1,000 mg by mouth every 6 (six) hours as needed.     AIMOVIG 70 MG/ML SOAJ Inject 70 mg into the skin daily as needed (migraine).     albuterol (VENTOLIN HFA) 108 (90 Base) MCG/ACT inhaler Inhale 1-2 puffs into the lungs every 6 (six) hours as needed for wheezing or shortness of breath. 18 g 3   aspirin 325 MG tablet Take 1 tablet (325 mg total) by mouth daily with breakfast. 30 tablet 2   bisoprolol-hydrochlorothiazide (ZIAC) 5-6.25 MG tablet Take 1 tablet by mouth daily. -Take in place of metoprolol 30 tablet 2   calcium-vitamin D (OSCAL WITH D) 500-200 MG-UNIT tablet Take 1 tablet by mouth 2 (two) times daily.     diclofenac sodium (VOLTAREN) 1 % GEL Apply 4 g topically daily.     Dupilumab (DUPIXENT [employer]) Inject into the skin.     Dupilumab (DUPIXENT) 300 MG/2ML SOPN Inject 300 mg into the skin every 14 (fourteen) days.     EPINEPHrine 0.3 mg/0.3 mL IJ SOAJ injection Inject 0.3 mg into the muscle as needed for anaphylaxis.     ferrous sulfate 325 (65 FE) MG tablet Take 325 mg by mouth 2 (two) times daily before a meal.     Fluticasone-Umeclidin-Vilant (TRELEGY ELLIPTA) 100-62.5-25 MCG/ACT AEPB Take 1 spray by mouth daily.  28 each 3   gabapentin (NEURONTIN) 300 MG capsule Take 300 mg by mouth 2 (two) times daily. (Patient not taking: Reported on 07/06/2021)     gabapentin (NEURONTIN) 600 MG tablet TAKE (1) TABLET BY MOUTH TWICE DAILY. 180 tablet 0   levothyroxine (SYNTHROID) 50 MCG tablet Take 1 tablet by mouth daily.     lisinopril (ZESTRIL) 2.5 MG tablet Take 2.5 mg by mouth daily.     Magnesium 250 MG TABS Take 400 mg by mouth every evening.     meclizine (ANTIVERT) 25 MG tablet Take 25 mg by mouth 2 (two) times daily as needed.     meloxicam (MOBIC) 15 MG tablet Take 1 tablet (15 mg total) by mouth daily with breakfast. 30 tablet 1   montelukast (SINGULAIR) 10 MG tablet Take 10 mg by mouth at bedtime.     Multiple Vitamin (MULTIVITAMIN WITH MINERALS) TABS tablet Take 1 tablet by mouth daily.     omeprazole (PRILOSEC) 40 MG capsule Take 40 mg by mouth 2 (two) times daily before a meal.     ondansetron (ZOFRAN) 4 MG tablet Take 1 tablet (4 mg total) by mouth every 8 (eight) hours as needed. 20 tablet 0  oxyCODONE-acetaminophen (PERCOCET) 7.5-325 MG tablet Take 7.5 tablets by mouth every 6 (six) hours as needed.     rosuvastatin (CRESTOR) 20 MG tablet Take 20 mg by mouth at bedtime. (Patient not taking: Reported on 02/17/2021)     vitamin E 180 MG (400 UNITS) capsule Take 400 Units by mouth at bedtime.     vortioxetine HBr (TRINTELLIX) 20 MG TABS tablet Take 1 tablet (20 mg total) by mouth daily. 90 tablet 0   ziprasidone (GEODON) 80 MG capsule Take 1 capsule (80 mg total) by mouth 2 (two) times daily with a meal. 180 capsule 0   No facility-administered medications prior to visit.     Objective:     There were no vitals taken for this visit.         Assessment   No problem-specific Assessment & Plan notes found for this encounter.     Sandrea Hughs, MD 03/05/2022

## 2022-03-18 DIAGNOSIS — J449 Chronic obstructive pulmonary disease, unspecified: Secondary | ICD-10-CM | POA: Diagnosis not present

## 2022-03-30 ENCOUNTER — Telehealth (HOSPITAL_COMMUNITY): Payer: Self-pay | Admitting: Professional

## 2022-03-31 ENCOUNTER — Encounter (HOSPITAL_COMMUNITY): Payer: Self-pay

## 2022-03-31 ENCOUNTER — Ambulatory Visit (INDEPENDENT_AMBULATORY_CARE_PROVIDER_SITE_OTHER): Payer: Medicare Other | Admitting: Licensed Clinical Social Worker

## 2022-03-31 DIAGNOSIS — Z634 Disappearance and death of family member: Secondary | ICD-10-CM

## 2022-03-31 DIAGNOSIS — F411 Generalized anxiety disorder: Secondary | ICD-10-CM

## 2022-03-31 DIAGNOSIS — F3132 Bipolar disorder, current episode depressed, moderate: Secondary | ICD-10-CM

## 2022-03-31 NOTE — Progress Notes (Signed)
LVirtual Visit via Audio Note  I connected with Kristen Ryan on 03/31/22 at  9:00 AM EDT by an audio enabled telemedicine application and verified that I am speaking with the correct person using two identifiers.  Location: Patient: home Provider: remote office [place], [place])   I discussed the limitations of evaluation and management by telemedicine and the availability of in person appointments. The patient expressed understanding and agreed to proceed.   I discussed the assessment and treatment plan with the patient. The patient was provided an opportunity to ask questions and all were answered. The patient agreed with the plan and demonstrated an understanding of the instructions.   The patient was advised to call back or seek an in-person evaluation if the symptoms worsen or if the condition fails to improve as anticipated.  I provided 40 minutes of non-face-to-face time during this encounter.   M[place], LCSW   THERAPIST PROGRESS NOTE  Session Time: [id number]  Participation Level: Active-  Behavioral Response: NAAlertAnxious and Dysphoric  Type of Therapy: Individual Therapy  Treatment Goals addressed: Problem: Depression  Goal: Decrease depressive symptoms and improve levels of effective functioning-pt reports a decrease in overall depression symptoms 3 out of 5 sessions documented.  Outcome: Progressing Goal: Develop healthy thinking patterns and beliefs about self, others, and the world that lead to the alleviation and help prevent the relapse of depression per self report 3 out of 5 sessions documented.   Outcome: Progressing Goal: Reduce overall frequency, intensity, and duration of the anxiety so that daily functioning is not impaired per pt self report 3 out of 5 sessions documented.   Outcome: Progressing Intervention: Discuss self-management skills Note: Reviewed    Problem: Alleviate depressive/manic symptoms and return to improved levels of  effective functioning. Goal: STG: @PREFFIRSTNAME @ will attend at least 80% of scheduled follow-up medication management appointments Outcome: Progressing     ProgressTowards Goals: Progressing  Interventions: Supportive  Summary: Kristen Ryan is a 66 y.o. female who presents with  continuing symptoms related to bipolar disorder.    Allowed pt to explore and express thoughts and feelings associated with recent life situations and external stressors. Patient reports one of her primary external stressors is a headache that she has been experiencing for over 11 weeks now. Patient reports that she has reached out to her primary care physician several times for management of the pain unsuccessfully. Patient reports that she has been referred to specialists, including neurologist but states that nothing is helping with the headache. Patient reports that this is impacting her overall functioning, and is impacting the relationship with her husband.  Explored relationship with husband, and patient feels that he is being very dismissive of her, not concerned about her overall health, and only spends quality time with his nephew when they attend multiple races on the weekends. Patient reports that she is left behind at home by herself, often not feeling well.   Discussed ongoing grief associated with the loss of pets recently. Allowed patient to explore her grief and recognize that loss is loss, regardless of whether you have lost a person, something else major in your life, or pets. Discussed emotional connection with animals, and how loss can impact someone significantly. Patient reflects understanding her own grief, and ability to choose behaviors that help her get out of an episode if she is in one.  Patient reports that she does have positive support, and that her son is a valuable support system for her period patient  reports that she does talk with her son every day, and then her son comes and helps  her on a regular basis.  Encourage patient to continue to reach out to medical professionals about management of her continuing headaches, since she feels that the pain is impacting her overall quality of life.  Continued recommendations are as follows: self care behaviors, positive social engagements, focusing on overall work/home/life balance, and focusing on positive physical and emotional wellness.   Suicidal/Homicidal: No  Therapist Response: Pt is continuing to apply interventions learned in session into daily life situations. Pt is currently on track to meet goals utilizing interventions mentioned above. Personal growth and progress noted. Treatment to continue as indicated.    Plan: Return again in 4 weeks.  Diagnosis:  Encounter Diagnoses  Name Primary?   Bipolar 1 disorder, depressed, moderate (HCC) Yes   Generalized anxiety disorder    Bereavement     Collaboration of Care: Other pt reports PCP managing medications--Pt states that she has found new psychiatrist in [place], Dr. May  Patient/Guardian was advised Release of Information must be obtained prior to any record release in order to collaborate their care with an outside provider. Patient/Guardian was advised if they have not already done so to contact the registration department to sign all necessary forms in order for [place] to release information regarding their care.   Consent: Patient/Guardian gives verbal consent for treatment and assignment of benefits for services provided during this visit. Patient/Guardian expressed understanding and agreed to proceed.   Ernest Haber Gabriana Wilmott, LCSW 03/31/2022

## 2022-03-31 NOTE — Plan of Care (Signed)
  Problem: Depression  Goal: Decrease depressive symptoms and improve levels of effective functioning-pt reports a decrease in overall depression symptoms 3 out of 5 sessions documented.  Outcome: Progressing Goal: Develop healthy thinking patterns and beliefs about self, others, and the world that lead to the alleviation and help prevent the relapse of depression per self report 3 out of 5 sessions documented.   Outcome: Progressing Goal: Reduce overall frequency, intensity, and duration of the anxiety so that daily functioning is not impaired per pt self report 3 out of 5 sessions documented.   Outcome: Progressing Intervention: Discuss self-management skills Note: Reviewed    Problem: Alleviate depressive/manic symptoms and return to improved levels of effective functioning. Goal: STG: @PREFFIRSTNAME @ will attend at least 80% of scheduled follow-up medication management appointments Outcome: Progressing

## 2022-04-13 ENCOUNTER — Institutional Professional Consult (permissible substitution): Payer: Medicare Other | Admitting: Internal Medicine

## 2022-04-15 DIAGNOSIS — G43909 Migraine, unspecified, not intractable, without status migrainosus: Secondary | ICD-10-CM | POA: Diagnosis not present

## 2022-04-15 DIAGNOSIS — L409 Psoriasis, unspecified: Secondary | ICD-10-CM | POA: Diagnosis not present

## 2022-04-17 DIAGNOSIS — J449 Chronic obstructive pulmonary disease, unspecified: Secondary | ICD-10-CM | POA: Diagnosis not present

## 2022-05-01 ENCOUNTER — Ambulatory Visit (HOSPITAL_COMMUNITY): Payer: Medicare Other | Admitting: Psychiatry

## 2022-05-11 ENCOUNTER — Ambulatory Visit (INDEPENDENT_AMBULATORY_CARE_PROVIDER_SITE_OTHER): Payer: Medicare Other | Admitting: Licensed Clinical Social Worker

## 2022-05-11 DIAGNOSIS — Z634 Disappearance and death of family member: Secondary | ICD-10-CM | POA: Diagnosis not present

## 2022-05-11 DIAGNOSIS — F3132 Bipolar disorder, current episode depressed, moderate: Secondary | ICD-10-CM | POA: Diagnosis not present

## 2022-05-11 DIAGNOSIS — F411 Generalized anxiety disorder: Secondary | ICD-10-CM

## 2022-05-11 NOTE — Progress Notes (Signed)
Virtual Visit via Audio Note  I connected with Kristen Ryan on 05/11/22 at 10:00 AM EST by an audio enabled telemedicine application and verified that I am speaking with the correct person using two identifiers.  Location: Patient: home Provider: remote office [place], [place])   I discussed the limitations of evaluation and management by telemedicine and the availability of in person appointments. The patient expressed understanding and agreed to proceed.   I discussed the assessment and treatment plan with the patient. The patient was provided an opportunity to ask questions and all were answered. The patient agreed with the plan and demonstrated an understanding of the instructions.   The patient was advised to call back or seek an in-person evaluation if the symptoms worsen or if the condition fails to improve as anticipated.  I provided 40 minutes of non-face-to-face time during this encounter.   [place], LCSW   THERAPIST PROGRESS NOTE  Session Time: [phone]  Participation Level: Active-  Behavioral Response: NAAlertAnxious and Dysphoric  Type of Therapy: Individual Therapy  Treatment Goals addressed:    Problem: Depression  Goal: Decrease depressive symptoms and improve levels of effective functioning-pt reports a decrease in overall depression symptoms 3 out of 5 sessions documented.  Outcome: Progressing Goal: Develop healthy thinking patterns and beliefs about self, others, and the world that lead to the alleviation and help prevent the relapse of depression per self report 3 out of 5 sessions documented.   Outcome: Progressing Goal: Reduce overall frequency, intensity, and duration of the anxiety so that daily functioning is not impaired per pt self report 3 out of 5 sessions documented.   Outcome: Progressing Intervention: Encourage self-care activities Note: Continued to encourage Intervention: Encourage verbalization of feelings, perceptions, fears,  stressors, loss of loved one Note: Allowed pt to explore loss of husband Intervention: REVIEW PLEASE SKILLS (TREAT PHYSICAL ILLNESS, BALANCE EATING, AVOID MOOD-ALTERING SUBSTANCES, BALANCE SLEEP AND GET EXERCISE) WITH Sophina Note: Reviewed    Problem: Alleviate depressive/manic symptoms and return to improved levels of effective functioning. Goal: STG: @PREFFIRSTNAME @ will attend at least 80% of scheduled follow-up medication management appointments Outcome: Progressing    ProgressTowards Goals: Progressing  Interventions: Strength-based, Supportive, and Family Systems  Summary: Kristen Ryan is a 66 y.o. female who presents with  continuing symptoms related to bipolar disorder.  Pt reports that overall mood has been "ok" with good days and bad days. Pt reports that sleep is better when her son comes and stays with her on the weekends.   Allowed pt to explore and express thoughts and feelings associated with recent life situations and external stressors. Pt reports that she has mixed feelings after her husband recently left the marriage. Pt states that she had been complaining about him either sleeping all the time or being gone all the time and spending all of his free time with his nephew. Pt states that he just came home, packed up his belongings, and left. Pt states that she doesn't feel bad that he's gone "I'm kinid of glad". Pt does have financial worries--pt states that her husband is wanting money from the house, which pt states she does not have to give him. Assisted pt with legal aid contact numbers.   Discussed pt's continuing phobia of shower--pt initially would only shower when her husband was home because she did not feel safe. Pt states that she promised her son she would shower two times per week and feels happy that she has her dogs in the  home to assist with her feeling safe.    Continued recommendations are as follows: self care behaviors, positive social engagements,  focusing on overall work/home/life balance, and focusing on positive physical and emotional wellness.   Suicidal/Homicidal: No  Therapist Response: Pt is continuing to apply interventions learned in session into daily life situations. Pt is currently on track to meet goals utilizing interventions mentioned above. Personal growth and progress noted. Treatment to continue as indicated.    Plan: Return again in 4 weeks.  Diagnosis:  Encounter Diagnoses  Name Primary?   Bipolar 1 disorder, depressed, moderate (HCC) Yes   Generalized anxiety disorder    Bereavement     Collaboration of Care: Other pt reports PCP managing medications--Pt states that she has found new psychiatrist in [place], Dr. May  Patient/Guardian was advised Release of Information must be obtained prior to any record release in order to collaborate their care with an outside provider. Patient/Guardian was advised if they have not already done so to contact the registration department to sign all necessary forms in order for [place] to release information regarding their care.   Consent: Patient/Guardian gives verbal consent for treatment and assignment of benefits for services provided during this visit. Patient/Guardian expressed understanding and agreed to proceed.   [place], LCSW 05/11/2022

## 2022-05-11 NOTE — Plan of Care (Signed)
  Problem: Depression  Goal: Decrease depressive symptoms and improve levels of effective functioning-pt reports a decrease in overall depression symptoms 3 out of 5 sessions documented.  Outcome: Progressing Goal: Develop healthy thinking patterns and beliefs about self, others, and the world that lead to the alleviation and help prevent the relapse of depression per self report 3 out of 5 sessions documented.   Outcome: Progressing Goal: Reduce overall frequency, intensity, and duration of the anxiety so that daily functioning is not impaired per pt self report 3 out of 5 sessions documented.   Outcome: Progressing Intervention: Encourage self-care activities Note: Continued to encourage Intervention: Encourage verbalization of feelings, perceptions, fears, stressors, loss of loved one Note: Allowed pt to explore loss of husband Intervention: REVIEW PLEASE SKILLS (TREAT PHYSICAL ILLNESS, BALANCE EATING, AVOID MOOD-ALTERING SUBSTANCES, BALANCE SLEEP AND GET EXERCISE) WITH [place] Note: Reviewed    Problem: Alleviate depressive/manic symptoms and return to improved levels of effective functioning. Goal: STG: @PREFFIRSTNAME @ will attend at least 80% of scheduled follow-up medication management appointments Outcome: Progressing

## 2022-05-18 DIAGNOSIS — J449 Chronic obstructive pulmonary disease, unspecified: Secondary | ICD-10-CM | POA: Diagnosis not present

## 2022-05-19 ENCOUNTER — Ambulatory Visit (HOSPITAL_COMMUNITY): Payer: Medicare Other | Admitting: Psychiatry

## 2022-05-19 ENCOUNTER — Encounter (HOSPITAL_COMMUNITY): Payer: Self-pay

## 2022-05-21 ENCOUNTER — Institutional Professional Consult (permissible substitution): Payer: Medicare Other | Admitting: Internal Medicine

## 2022-05-27 ENCOUNTER — Institutional Professional Consult (permissible substitution): Payer: Medicare Other | Admitting: Internal Medicine

## 2022-06-08 ENCOUNTER — Encounter (HOSPITAL_COMMUNITY): Payer: Self-pay | Admitting: Psychiatry

## 2022-06-08 ENCOUNTER — Ambulatory Visit (INDEPENDENT_AMBULATORY_CARE_PROVIDER_SITE_OTHER): Payer: Medicare Other | Admitting: Psychiatry

## 2022-06-08 DIAGNOSIS — F411 Generalized anxiety disorder: Secondary | ICD-10-CM | POA: Diagnosis not present

## 2022-06-08 DIAGNOSIS — F250 Schizoaffective disorder, bipolar type: Secondary | ICD-10-CM

## 2022-06-08 DIAGNOSIS — F4310 Post-traumatic stress disorder, unspecified: Secondary | ICD-10-CM

## 2022-06-08 DIAGNOSIS — F3175 Bipolar disorder, in partial remission, most recent episode depressed: Secondary | ICD-10-CM | POA: Diagnosis not present

## 2022-06-08 DIAGNOSIS — G894 Chronic pain syndrome: Secondary | ICD-10-CM

## 2022-06-08 DIAGNOSIS — F259 Schizoaffective disorder, unspecified: Secondary | ICD-10-CM | POA: Insufficient documentation

## 2022-06-08 DIAGNOSIS — F410 Panic disorder [episodic paroxysmal anxiety] without agoraphobia: Secondary | ICD-10-CM | POA: Insufficient documentation

## 2022-06-08 MED ORDER — MIRTAZAPINE 15 MG PO TABS: 15.0000 mg | ORAL_TABLET | Freq: Every day | ORAL | 1 refills | Status: DC

## 2022-06-08 MED ORDER — ZIPRASIDONE HCL 80 MG PO CAPS: 80.0000 mg | ORAL_CAPSULE | Freq: Two times a day (BID) | ORAL | 2 refills | Status: DC

## 2022-06-08 MED ORDER — VORTIOXETINE HBR 20 MG PO TABS: 20.0000 mg | ORAL_TABLET | Freq: Every day | ORAL | 2 refills | Status: DC

## 2022-06-08 MED ORDER — GABAPENTIN 600 MG PO TABS: 600.0000 mg | ORAL_TABLET | Freq: Two times a day (BID) | ORAL | 2 refills | Status: DC
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
## 2022-06-08 NOTE — Patient Instructions (Signed)
We continued your trintellix at 20mg  once daily (try to take this medication daily), gabapentin 600mg  twice daily, geodon 80mg  twice daily with meals. We added remeron 15mg  to be taken nightly to your regimen today. This should begin to help with your sleep, depressed mood, and high anxious state. I will message Dr. office to recheck your ecg, lipid panel, A1c, vitamin d, vitamin b12, iron level, and a cmp.

## 2022-06-12 ENCOUNTER — Telehealth (HOSPITAL_COMMUNITY): Payer: Self-pay

## 2022-06-12 NOTE — Telephone Encounter (Signed)
Returning pt's vm from 06/11/22 no answer left vm

## 2022-06-17 DIAGNOSIS — J449 Chronic obstructive pulmonary disease, unspecified: Secondary | ICD-10-CM | POA: Diagnosis not present

## 2022-06-22 ENCOUNTER — Telehealth (HOSPITAL_COMMUNITY): Payer: Self-pay | Admitting: Licensed Clinical Social Worker

## 2022-06-22 ENCOUNTER — Ambulatory Visit (INDEPENDENT_AMBULATORY_CARE_PROVIDER_SITE_OTHER): Payer: Medicare Other | Admitting: Licensed Clinical Social Worker

## 2022-06-22 DIAGNOSIS — F411 Generalized anxiety disorder: Secondary | ICD-10-CM | POA: Diagnosis not present

## 2022-06-22 DIAGNOSIS — F250 Schizoaffective disorder, bipolar type: Secondary | ICD-10-CM

## 2022-06-22 DIAGNOSIS — F4310 Post-traumatic stress disorder, unspecified: Secondary | ICD-10-CM | POA: Diagnosis not present

## 2022-06-22 NOTE — Telephone Encounter (Signed)
Pt provided consent to reach out to husband, who is her only source of transportation. Encouraged pt husband to transport pt for evaluation of escalating anxiety symptoms. Pt husband offers to take pt but feels pt will refuse.

## 2022-06-22 NOTE — Progress Notes (Incomplete)
Virtual Visit via Audio Note  I connected with Kristen Ryan on 06/22/22 at  1:00 PM EST by an audio enabled telemedicine application and verified that I am speaking with the correct person using two identifiers.  Location: Patient: home Provider: remote office [place], [place])   I discussed the limitations of evaluation and management by telemedicine and the availability of in person appointments. The patient expressed understanding and agreed to proceed.   I discussed the assessment and treatment plan with the patient. The patient was provided an opportunity to ask questions and all were answered. The patient agreed with the plan and demonstrated an understanding of the instructions.   The patient was advised to call back or seek an in-person evaluation if the symptoms worsen or if the condition fails to improve as anticipated.  I provided 30 minutes of non-face-to-face time during this encounter.   Kristen Ryan R Kihanna Kamiya, LCSW   THERAPIST PROGRESS NOTE  Session Time: 1-130p  Participation Level: Active  Behavioral Response: NAAlertAnxious and Dysphoric  Type of Therapy: Individual Therapy  Treatment Goals addressed: Develop healthy thinking patterns and beliefs about self, others, and the world that lead to the alleviation and help prevent the relapse of depression per self report 3 out of 5 sessions documented     ProgressTowards Goals: Not Progressing  Interventions: Solution Focused and Supportive  Summary: Kristen Ryan is a 66 y.o. female who presents with  escalating symptoms related to schizoaffective disorder.  Patient reports that she had a recent visit with her psychiatrist, who changed the medications for her at that time period the date of her visit was 06/08/22. Patient reports that she is feeling very anxious, nervous, nauseous, and vomiting. Patient reports that she needs xanax to make her feel better. Discussed patients xanax use with her, and patient  states that she hasn't taken any in weeks, but she just knows that that's what's going to make her feel better today.  Encourage patient to:  1.  Call psychiatrist office to inform them of her concerns. Pt states she already did and that they didn't do anything. Pt states that the office told her "he's not here and he can't see you".  Informed pt that if she is having severe side effects from medications that she needs to be seen by a physician. 2.  Encouraged pt to go to local ED or [facility] in [place] for assessment.  Pt states that she doesn't have transportation. Informed pt that she could call 911 and have emergency transport to the hospital. Pt reflects understanding.  Pt reports that she does have suicidal thoughts with no plans or intent to follow through when she throws up.  Pt provided clinician consent to contact her estranged husband. Clinician called husband and he stated that he was on the way to pts home to bring her additional medication.  Pt called office back and stated that her husband brought her Xanax, and she is stable at this time. Encouraged pt to reach back out to psychiatrist, go to ED, call 988, or [facility] if symptoms escalate. Pt and pts husband both understand and are cooperative.   Suicidal/Homicidal: Yes--pt reports that she is having suicidal thoughts when she gets sick and throws up  Therapist Response: Pts current escalating symptoms to be exhausting present resources. Encouraged pt to reach out for higher level of supports at this time: psychiatric medication management, ED, 988, or [facility]  Plan: Return again in 4 weeks.  Diagnosis:  Encounter Diagnoses  Name Primary?   Schizoaffective disorder, bipolar type (HCC) Yes   PTSD (post-traumatic stress disorder)    Generalized anxiety disorder    Collaboration of Care: Other Pt to continue services with psychiatrist of record, Dr. Tia Masker  Patient/Guardian was advised Release of Information must be obtained  prior to any record release in order to collaborate their care with an outside provider. Patient/Guardian was advised if they have not already done so to contact the registration department to sign all necessary forms in order for [place] to release information regarding their care.   Consent: Patient/Guardian gives verbal consent for treatment and assignment of benefits for services provided during this visit. Patient/Guardian expressed understanding and agreed to proceed.   Ernest Haber Eisa Necaise, LCSW 06/22/2022

## 2022-06-22 NOTE — Telephone Encounter (Signed)
Returned pts phone call--pt stated that her husband brought her a xanax and she is certain that it will help her manage the escalating anxiety symptoms she is experiencing.  Encouraged pt to call her psychiatrist, 911, or go to ED or [facility] for any urgent mental health care needs, or if she is feeling suicidal. Pt is appreciative.

## 2022-07-07 ENCOUNTER — Other Ambulatory Visit (HOSPITAL_COMMUNITY): Payer: Self-pay | Admitting: Psychiatry

## 2022-07-07 DIAGNOSIS — F4310 Post-traumatic stress disorder, unspecified: Secondary | ICD-10-CM

## 2022-07-09 ENCOUNTER — Other Ambulatory Visit (HOSPITAL_COMMUNITY): Payer: Self-pay | Admitting: Psychiatry

## 2022-07-09 ENCOUNTER — Telehealth (HOSPITAL_COMMUNITY): Payer: Medicare Other | Admitting: Psychiatry

## 2022-07-09 DIAGNOSIS — F4310 Post-traumatic stress disorder, unspecified: Secondary | ICD-10-CM

## 2022-07-09 MED ORDER — MIRTAZAPINE 30 MG PO TABS: 30.0000 mg | ORAL_TABLET | Freq: Every day | ORAL | 0 refills | Status: DC

## 2022-07-09 NOTE — Progress Notes (Signed)
And reschedule appointment due to provider being available.  Will call in next dose seems to be tolerating well at this time.

## 2022-07-13 ENCOUNTER — Telehealth (INDEPENDENT_AMBULATORY_CARE_PROVIDER_SITE_OTHER): Payer: 59 | Admitting: Psychiatry

## 2022-07-13 DIAGNOSIS — F4310 Post-traumatic stress disorder, unspecified: Secondary | ICD-10-CM

## 2022-07-13 DIAGNOSIS — Z79899 Other long term (current) drug therapy: Secondary | ICD-10-CM | POA: Insufficient documentation

## 2022-07-13 DIAGNOSIS — F1390 Sedative, hypnotic, or anxiolytic use, unspecified, uncomplicated: Secondary | ICD-10-CM | POA: Insufficient documentation

## 2022-07-13 DIAGNOSIS — G894 Chronic pain syndrome: Secondary | ICD-10-CM

## 2022-07-13 DIAGNOSIS — F250 Schizoaffective disorder, bipolar type: Secondary | ICD-10-CM

## 2022-07-13 DIAGNOSIS — F410 Panic disorder [episodic paroxysmal anxiety] without agoraphobia: Secondary | ICD-10-CM

## 2022-07-13 DIAGNOSIS — F411 Generalized anxiety disorder: Secondary | ICD-10-CM | POA: Diagnosis not present

## 2022-07-13 DIAGNOSIS — F5000 Anorexia nervosa, unspecified: Secondary | ICD-10-CM
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
## 2022-07-13 NOTE — Patient Instructions (Signed)
Please coordinate with your primary care provider in obtaining an updated lipid panel, A1c, ECG, vitamin B 12, iron panel, vitamin D level.  We will keep her medications the same for now to give the Remeron a chance to work.  Please stay away from benzodiazepines like Xanax as these are particularly dangerous for you to use.

## 2022-07-17 ENCOUNTER — Institutional Professional Consult (permissible substitution): Payer: Medicare Other | Admitting: Pulmonary Disease

## 2022-07-18 DIAGNOSIS — J449 Chronic obstructive pulmonary disease, unspecified: Secondary | ICD-10-CM | POA: Diagnosis not present

## 2022-07-24 ENCOUNTER — Institutional Professional Consult (permissible substitution): Payer: Medicare Other | Admitting: Internal Medicine

## 2022-07-24 NOTE — Progress Notes (Deleted)
Kristen Ryan, female    DOB: Apr 13, 1956    MRN: [id number]   Brief patient profile:  ***  yo*** *** referred to pulmonary clinic in [place]  07/24/2022 by *** for ***      History of Present Illness  07/24/2022  Pulmonary/ 1st office eval/ Melvyn Novas / [place] [facility]  No chief complaint on file.    Dyspnea:  *** Cough: *** Sleep: *** SABA use: *** 02: *** Lung cancer screen: ***  Past Medical History:  Diagnosis Date   Acute acalculous cholecystitis 07/06/2021   Acute respiratory failure with hypoxia ([place]) 08/23/2015   Anemia    Asthma    Bipolar 1 disorder (HCC)    Bipolar 1 disorder ([place]) 02/14/2015   Bipolar 1 disorder, depressed, moderate ([place]) 02/15/2020   Bipolar 1 disorder, mixed, moderate ([place]) 08/27/2016   Bipolar disorder in partial remission ([place])    Bronchitis    COLLES' FRACTURE, LEFT WRIST 03/27/2008   Qualifier: Diagnosis of   By: Aline Brochure MD, Dorothyann Peng       COPD (chronic obstructive pulmonary disease) ([place])    Diverticulitis    Hypertension    IBS (irritable bowel syndrome)    Migraine    Neuropathy    Schizophrenia ([place])    Sigmoid diverticulitis 08/03/2015   Thyroid disease     Outpatient Medications Prior to Visit  Medication Sig Dispense Refill   acetaminophen (TYLENOL) 500 MG tablet Take 1,000 mg by mouth every 6 (six) hours as needed.     albuterol (VENTOLIN HFA) 108 (90 Base) MCG/ACT inhaler Inhale 1-2 puffs into the lungs every 6 (six) hours as needed for wheezing or shortness of breath. 18 g 3   bisoprolol-hydrochlorothiazide (ZIAC) 5-6.25 MG tablet Take 1 tablet by mouth daily. -Take in place of metoprolol 30 tablet 2   calcium-vitamin D (OSCAL WITH D) 500-200 MG-UNIT tablet Take 1 tablet by mouth 2 (two) times daily.     diclofenac sodium (VOLTAREN) 1 % GEL Apply 4 g topically daily.     EPINEPHrine 0.3 mg/0.3 mL IJ SOAJ injection Inject 0.3 mg into the muscle as needed for anaphylaxis.     ferrous sulfate 325 (65 FE) MG tablet Take  325 mg by mouth 2 (two) times daily before a meal.     Fluticasone-Umeclidin-Vilant (TRELEGY ELLIPTA) 100-62.5-25 MCG/ACT AEPB Take 1 spray by mouth daily. 28 each 3   gabapentin (NEURONTIN) 600 MG tablet Take 1 tablet (600 mg total) by mouth 2 (two) times daily. TAKE (1) TABLET BY MOUTH TWICE DAILY. 60 tablet 2   GEMTESA 75 MG TABS Take 1 tablet by mouth daily.     levothyroxine (SYNTHROID) 50 MCG tablet Take 1 tablet by mouth daily.     lisinopril (ZESTRIL) 2.5 MG tablet Take 2.5 mg by mouth daily.     Magnesium 250 MG TABS Take 400 mg by mouth every evening.     methocarbamol (ROBAXIN) 750 MG tablet Take 750 mg by mouth 2 (two) times daily as needed for muscle spasms.     mirtazapine (REMERON) 30 MG tablet Take 1 tablet (30 mg total) by mouth at bedtime. 30 tablet 0   montelukast (SINGULAIR) 10 MG tablet Take 10 mg by mouth at bedtime.     Multiple Vitamin (MULTIVITAMIN WITH MINERALS) TABS tablet Take 1 tablet by mouth daily.     omeprazole (PRILOSEC) 40 MG capsule Take 40 mg by mouth 2 (two) times daily before a meal.     ondansetron (ZOFRAN) 4 MG  tablet Take 1 tablet (4 mg total) by mouth every 8 (eight) hours as needed. 20 tablet 0   oxyCODONE-acetaminophen (PERCOCET) 7.5-325 MG tablet Take 7.5 tablets by mouth every 6 (six) hours as needed.     vortioxetine HBr (TRINTELLIX) 20 MG TABS tablet Take 1 tablet (20 mg total) by mouth daily. 30 tablet 2   ziprasidone (GEODON) 80 MG capsule Take 1 capsule (80 mg total) by mouth 2 (two) times daily with a meal. 60 capsule 2   No facility-administered medications prior to visit.     Objective:     There were no vitals taken for this visit.         Assessment   No problem-specific Assessment & Plan notes found for this encounter.     Christinia Gully, MD 07/24/2022

## 2022-07-27 DIAGNOSIS — E039 Hypothyroidism, unspecified: Secondary | ICD-10-CM | POA: Diagnosis not present

## 2022-07-27 DIAGNOSIS — I10 Essential (primary) hypertension: Secondary | ICD-10-CM | POA: Diagnosis not present

## 2022-08-08 DIAGNOSIS — G894 Chronic pain syndrome: Secondary | ICD-10-CM | POA: Diagnosis not present

## 2022-08-08 DIAGNOSIS — I10 Essential (primary) hypertension: Secondary | ICD-10-CM | POA: Diagnosis not present

## 2022-08-08 DIAGNOSIS — E782 Mixed hyperlipidemia: Secondary | ICD-10-CM | POA: Diagnosis not present

## 2022-08-08 DIAGNOSIS — K219 Gastro-esophageal reflux disease without esophagitis: Secondary | ICD-10-CM | POA: Diagnosis not present

## 2022-08-08 DIAGNOSIS — N1832 Chronic kidney disease, stage 3b: Secondary | ICD-10-CM | POA: Diagnosis not present

## 2022-08-08 DIAGNOSIS — E039 Hypothyroidism, unspecified: Secondary | ICD-10-CM | POA: Diagnosis not present

## 2022-08-08 DIAGNOSIS — Z0000 Encounter for general adult medical examination without abnormal findings: Secondary | ICD-10-CM | POA: Diagnosis not present

## 2022-08-08 DIAGNOSIS — I129 Hypertensive chronic kidney disease with stage 1 through stage 4 chronic kidney disease, or unspecified chronic kidney disease: Secondary | ICD-10-CM | POA: Diagnosis not present

## 2022-08-08 DIAGNOSIS — M542 Cervicalgia: Secondary | ICD-10-CM | POA: Diagnosis not present

## 2022-08-08 DIAGNOSIS — Z0001 Encounter for general adult medical examination with abnormal findings: Secondary | ICD-10-CM | POA: Diagnosis not present

## 2022-08-17 ENCOUNTER — Telehealth (HOSPITAL_COMMUNITY): Payer: 59 | Admitting: Psychiatry

## 2022-08-17 ENCOUNTER — Telehealth (HOSPITAL_COMMUNITY): Payer: Self-pay

## 2022-08-17 NOTE — Telephone Encounter (Signed)
Spoke with pt she states that she does not need refills on anything she is not going to take the medication because it's not helping her

## 2022-08-17 NOTE — Telephone Encounter (Signed)
She stated that she fell today

## 2022-08-17 NOTE — Telephone Encounter (Signed)
FYI Pt called in stating that she wants to let provider know that the nerve medication is not working and she has given it ample amount of time. Advised to pt that she would need an appt. Pt had canceled appt this morning stating that she fell and hurt her back and is not able to come in.

## 2022-08-18 DIAGNOSIS — J449 Chronic obstructive pulmonary disease, unspecified: Secondary | ICD-10-CM | POA: Diagnosis not present

## 2022-08-18 NOTE — Telephone Encounter (Signed)
Spoke with pt she states that she does plan on following up with Dr Nehemiah Settle but she will just call back when she is feeling better. Advised pt to be mindful of no shows

## 2022-08-28 DIAGNOSIS — G894 Chronic pain syndrome: Secondary | ICD-10-CM | POA: Diagnosis not present

## 2022-08-28 DIAGNOSIS — M25511 Pain in right shoulder: Secondary | ICD-10-CM | POA: Diagnosis not present

## 2022-08-28 DIAGNOSIS — R42 Dizziness and giddiness: Secondary | ICD-10-CM | POA: Diagnosis not present

## 2022-09-01 DIAGNOSIS — Z713 Dietary counseling and surveillance: Secondary | ICD-10-CM | POA: Diagnosis not present

## 2022-09-01 DIAGNOSIS — R112 Nausea with vomiting, unspecified: Secondary | ICD-10-CM | POA: Diagnosis not present

## 2022-09-01 DIAGNOSIS — Z6820 Body mass index (BMI) 20.0-20.9, adult: Secondary | ICD-10-CM | POA: Diagnosis not present

## 2022-09-01 DIAGNOSIS — K219 Gastro-esophageal reflux disease without esophagitis: Secondary | ICD-10-CM | POA: Diagnosis not present

## 2022-09-01 DIAGNOSIS — Z87891 Personal history of nicotine dependence: Secondary | ICD-10-CM | POA: Diagnosis not present

## 2022-09-03 ENCOUNTER — Encounter (INDEPENDENT_AMBULATORY_CARE_PROVIDER_SITE_OTHER): Payer: Self-pay | Admitting: *Deleted

## 2022-09-07 ENCOUNTER — Encounter (HOSPITAL_COMMUNITY): Payer: Self-pay | Admitting: Psychiatry

## 2022-09-07 ENCOUNTER — Telehealth (INDEPENDENT_AMBULATORY_CARE_PROVIDER_SITE_OTHER): Payer: 59 | Admitting: Psychiatry

## 2022-09-07 ENCOUNTER — Other Ambulatory Visit (HOSPITAL_COMMUNITY): Payer: Self-pay | Admitting: Psychiatry

## 2022-09-07 DIAGNOSIS — F411 Generalized anxiety disorder: Secondary | ICD-10-CM

## 2022-09-07 DIAGNOSIS — F3175 Bipolar disorder, in partial remission, most recent episode depressed: Secondary | ICD-10-CM

## 2022-09-07 DIAGNOSIS — G894 Chronic pain syndrome: Secondary | ICD-10-CM

## 2022-09-07 DIAGNOSIS — F4310 Post-traumatic stress disorder, unspecified: Secondary | ICD-10-CM

## 2022-09-07 DIAGNOSIS — F1390 Sedative, hypnotic, or anxiolytic use, unspecified, uncomplicated: Secondary | ICD-10-CM

## 2022-09-07 DIAGNOSIS — F5000 Anorexia nervosa, unspecified: Secondary | ICD-10-CM

## 2022-09-07 DIAGNOSIS — Z79899 Other long term (current) drug therapy: Secondary | ICD-10-CM

## 2022-09-07 DIAGNOSIS — F250 Schizoaffective disorder, bipolar type: Secondary | ICD-10-CM

## 2022-09-07 DIAGNOSIS — F410 Panic disorder [episodic paroxysmal anxiety] without agoraphobia: Secondary | ICD-10-CM

## 2022-09-07 MED ORDER — ZIPRASIDONE HCL 80 MG PO CAPS: 80.0000 mg | ORAL_CAPSULE | Freq: Two times a day (BID) | ORAL | 0 refills | Status: DC

## 2022-09-07 MED ORDER — GABAPENTIN 600 MG PO TABS: 600.0000 mg | ORAL_TABLET | Freq: Two times a day (BID) | ORAL | 0 refills | Status: AC

## 2022-09-07 MED ORDER — VORTIOXETINE HBR 20 MG PO TABS: 20.0000 mg | ORAL_TABLET | Freq: Every day | ORAL | 0 refills | Status: DC
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
## 2022-09-07 NOTE — Patient Instructions (Signed)
I still strongly recommend that you stop using friends and ask as this puts you at high risk of developing dementia and having a fall.  He also interacts poorly with your opiate medication and could cause you to stop breathing and die.  As we discussed we are discharging you from the clinic as we are not equipped to handle benzodiazepine use disorder.  I have sent a 30-day supply of your medications and contacted her PCP for referral to an in-person psychiatrist in the area.

## 2022-09-16 DIAGNOSIS — J449 Chronic obstructive pulmonary disease, unspecified: Secondary | ICD-10-CM | POA: Diagnosis not present

## 2022-10-10 DIAGNOSIS — M25511 Pain in right shoulder: Secondary | ICD-10-CM | POA: Diagnosis not present

## 2022-10-10 DIAGNOSIS — R42 Dizziness and giddiness: Secondary | ICD-10-CM | POA: Diagnosis not present

## 2022-10-10 DIAGNOSIS — G8929 Other chronic pain: Secondary | ICD-10-CM | POA: Diagnosis not present

## 2022-10-10 DIAGNOSIS — G894 Chronic pain syndrome: Secondary | ICD-10-CM | POA: Diagnosis not present

## 2022-10-10 DIAGNOSIS — M545 Low back pain, unspecified: Secondary | ICD-10-CM | POA: Diagnosis not present

## 2022-10-10 DIAGNOSIS — Z79899 Other long term (current) drug therapy: Secondary | ICD-10-CM | POA: Diagnosis not present

## 2022-10-10 DIAGNOSIS — M4854XD Collapsed vertebra, not elsewhere classified, thoracic region, subsequent encounter for fracture with routine healing: Secondary | ICD-10-CM | POA: Diagnosis not present

## 2022-10-12 ENCOUNTER — Other Ambulatory Visit (HOSPITAL_COMMUNITY): Payer: Self-pay | Admitting: Internal Medicine

## 2022-10-12 DIAGNOSIS — M4854XA Collapsed vertebra, not elsewhere classified, thoracic region, initial encounter for fracture: Secondary | ICD-10-CM

## 2022-10-17 DIAGNOSIS — J449 Chronic obstructive pulmonary disease, unspecified: Secondary | ICD-10-CM | POA: Diagnosis not present

## 2022-10-20 ENCOUNTER — Ambulatory Visit (INDEPENDENT_AMBULATORY_CARE_PROVIDER_SITE_OTHER): Payer: 59 | Admitting: Licensed Clinical Social Worker

## 2022-10-20 DIAGNOSIS — F250 Schizoaffective disorder, bipolar type: Secondary | ICD-10-CM | POA: Diagnosis not present

## 2022-10-20 DIAGNOSIS — F4310 Post-traumatic stress disorder, unspecified: Secondary | ICD-10-CM

## 2022-10-20 NOTE — Progress Notes (Addendum)
Virtual Visit via Audio Note  I connected with Kristen Ryan on 10/20/22 at 10:00 AM EDT by an audio enabled telemedicine application and verified that I am speaking with the correct person using two identifiers.  Location: Patient: home Provider: remote office [place], [place])   I discussed the limitations of evaluation and management by telemedicine and the availability of in person appointments. The patient expressed understanding and agreed to proceed.   I discussed the assessment and treatment plan with the patient. The patient was provided an opportunity to ask questions and all were answered. The patient agreed with the plan and demonstrated an understanding of the instructions.   The patient was advised to call back or seek an in-person evaluation if the symptoms worsen or if the condition fails to improve as anticipated.  I provided 30 minutes of non-face-to-face time during this encounter.   Kristen Ryan R Kristen Fallaw, LCSW   THERAPIST PROGRESS NOTE  Session Time: 10-1030a  Participation Level: Active  Behavioral Response: NAAlertAnxious and Dysphoric  Type of Therapy: Individual Therapy  Treatment Goals addressed: Develop healthy thinking patterns and beliefs about self, others, and the world that lead to the alleviation and help prevent the relapse of depression per self report 3 out of 5 sessions documented     ProgressTowards Goals: Not Progressing  Interventions: Solution Focused and Supportive  Summary: Kristen Ryan is a 67 y.o. female who presents with continuing symptoms related to schizoaffective disorder.  Patient reports that she has recently had escalating symptoms including feelings of depression, high anxiety, auditory hallucinations, visual hallucinations, and suicidal thoughts.  Patient reports she does not have a suicide plan, or intent to follow through.  Patient reports that she currently does not have a psychiatric provider because her last  doctor would not give her Xanax.  Patient admits that she was taking Xanax that was given to her by a friend, because she needed something for her nerves.  Patient also states that she has not taken Xanax in weeks.  Patient reports that she has recently been noncompliant with all of her medications because she ran out, and did not pick them up from the pharmacy.  Discussed treatment options with patient--discussed going to the ED, going to [facility], or calling mobile crisis.  Patient states that she prefers to call mobile crisis.  Provided patient with [facility] mobile crisis number, since [facility] works out of the [facility].  Suicidal/Homicidal: Yes--pt reports thoughts with no plans or intent to follow through.  Patient reports auditory command hallucinations telling her to kill herself.  Therapist Response: Patient reports that she is currently in crisis mode, and feels that she needs treatment right away.  Encouraged patient to call mobile crisis, [phone], or go to local emergency room or behavioral health urgent care facility.  Patient reflects understanding  Plan: Return again in 1 weeks.  Diagnosis:  Encounter Diagnoses  Name Primary?   Schizoaffective disorder, bipolar type Yes   PTSD (post-traumatic stress disorder)    Collaboration of Care: Other Pt to continue services with psychiatrist of record, Dr. Tia Masker  Patient/Guardian was advised Release of Information must be obtained prior to any record release in order to collaborate their care with an outside provider. Patient/Guardian was advised if they have not already done so to contact the registration department to sign all necessary forms in order for [place] to release information regarding their care.   Consent: Patient/Guardian gives verbal consent for treatment and assignment of benefits  for services provided during this visit. Patient/Guardian expressed understanding and agreed to proceed  Active     Alleviate  depressive/manic symptoms and return to improved levels of effective functioning.     LTG: Stabilize mood and increase goal-directed behavior: Input needed on appropriate metric (Not Applicable)     Start:  03/27/21    Expected End:  03/05/22    Resolved:  11/25/21    Goal Note     Pt sick--difficult to assess mood         STG: @ will attend at least 80% of scheduled follow-up medication management appointments (Not Progressing)     Start:  03/27/21    Expected End:  04/01/23         Review using the "teach-back" method (verbalize understanding of emerging concerns and available supports)     Continuous   Start:  03/27/21          Intervention Note     Reviewed and had pt teach-back resources that were provided to pe           Depression      Decrease depressive symptoms and improve levels of effective functioning-pt reports a decrease in overall depression symptoms 3 out of 5 sessions documented.  (Not Progressing)     Start:  02/18/22    Expected End:  04/01/23         Develop healthy thinking patterns and beliefs about self, others, and the world that lead to the alleviation and help prevent the relapse of depression per self report 3 out of 5 sessions documented.   (Not Progressing)     Start:  02/18/22    Expected End:  04/01/23         Reduce overall frequency, intensity, and duration of the anxiety so that daily functioning is not impaired per pt self report 3 out of 5 sessions documented.   (Not Progressing)     Start:  02/18/22    Expected End:  04/01/23         Encourage self-care activities     Start:  02/18/22          Intervention Note     Continued to encourage         Encourage verbalization of feelings, perceptions, fears, stressors, loss of loved one     Start:  02/18/22          Intervention Note     Allowed pt to explore loss of husband         Discuss self-management skills     Start:  02/18/22           Intervention Note     Reviewed          REVIEW PLEASE SKILLS (TREAT PHYSICAL ILLNESS, BALANCE EATING, AVOID MOOD-ALTERING SUBSTANCES, BALANCE SLEEP AND GET EXERCISE) WITH Rainelle     Start:  02/18/22          Intervention Note     Reviewed           .   Ernest Haber Diamonds Lippard, LCSW 10/20/2022

## 2022-10-28 ENCOUNTER — Ambulatory Visit (INDEPENDENT_AMBULATORY_CARE_PROVIDER_SITE_OTHER): Payer: 59 | Admitting: Licensed Clinical Social Worker

## 2022-10-28 DIAGNOSIS — F250 Schizoaffective disorder, bipolar type: Secondary | ICD-10-CM | POA: Diagnosis not present

## 2022-10-28 NOTE — Progress Notes (Signed)
Virtual Visit via Audio Note  I connected with Kristen Ryan on 10/28/22 at  8:00 AM EDT by an audio enabled telemedicine application and verified that I am speaking with the correct person using two identifiers.  Location: Patient: home Provider: remote office [place], [place])   I discussed the limitations of evaluation and management by telemedicine and the availability of in person appointments. The patient expressed understanding and agreed to proceed.   I discussed the assessment and treatment plan with the patient. The patient was provided an opportunity to ask questions and all were answered. The patient agreed with the plan and demonstrated an understanding of the instructions.   The patient was advised to call back or seek an in-person evaluation if the symptoms worsen or if the condition fails to improve as anticipated.  I provided 18 minutes of non-face-to-face time during this encounter.   Kristen Ryan R Kristen Christoffel, LCSW   THERAPIST PROGRESS NOTE  Session Time: [phone]  Participation Level: Active  Behavioral Response: NAAlertAnxious and Dysphoric  Type of Therapy: Individual Therapy  Treatment Goals addressed: Develop healthy thinking patterns and beliefs about self, others, and the world that lead to the alleviation and help prevent the relapse of depression per self report 3 out of 5 sessions documented     ProgressTowards Goals: Not Progressing  Interventions: Solution Focused and Supportive  Summary: Kristen Ryan is a 67 y.o. female who presents with continuing symptoms related to schizoaffective disorder. Pt reports that she is continuing to experience depression symptoms, and that she has not followed up with a psychiatric or crisis center visit since last session.   Pt states that at last session her son didn't take her to crisis center or [facility], they went "riding around looking at the leaves changing colors". (Current month is April). Pt states that her  son helps her with her depression "he makes me laugh".  Encouraged pt that she needs medication management of her symptoms and that she should do walk in hours at [facility] in [facility].  Provided pt with the phone number and address of [facility] in [facility]. Pt states she will call today.   Patient reports that she has recently been noncompliant with all of her medications because she ran out, and did not pick them up from the pharmacy.  Pt currently NONCOMPLIANT with treatment recommendations.  Suicidal/Homicidal: Yes--pt reports thoughts with no plans or intent to follow through.    Therapist Response: Pt reports that her depression symptoms are not getting better--and she still has not gone to [facility] or with any crisis provider for management of symptoms  Plan: Return again in 1 weeks.  Diagnosis:  Encounter Diagnosis  Name Primary?   Schizoaffective disorder, bipolar type Yes    Collaboration of Care: Other Pt to continue services with psychiatrist of record, Dr. Tia Masker  Patient/Guardian was advised Release of Information must be obtained prior to any record release in order to collaborate their care with an outside provider. Patient/Guardian was advised if they have not already done so to contact the registration department to sign all necessary forms in order for [place] to release information regarding their care.   Consent: Patient/Guardian gives verbal consent for treatment and assignment of benefits for services provided during this visit. Patient/Guardian expressed understanding and agreed to proceed  .   Kristen Ryan Kristen Heal, LCSW 10/28/2022

## 2022-11-04 ENCOUNTER — Ambulatory Visit (INDEPENDENT_AMBULATORY_CARE_PROVIDER_SITE_OTHER): Payer: Self-pay | Admitting: Licensed Clinical Social Worker

## 2022-11-04 DIAGNOSIS — Z91199 Patient's noncompliance with other medical treatment and regimen due to unspecified reason: Secondary | ICD-10-CM

## 2022-11-04 NOTE — Progress Notes (Signed)
LCSW counselor attempted to connect with patient for scheduled appointment via MyChart video text request x 2 and email request with no response.   Attempt 1: Text and email: 8:04a  Attempt 2: Text and email: 8:11a   Video session was closed at :  8:16a  Per [facility] policy, after multiple attempts to reach pt unsuccessfully at appointed time--visit will be coded as no show

## 2022-11-09 ENCOUNTER — Other Ambulatory Visit (HOSPITAL_COMMUNITY): Payer: Self-pay | Admitting: Psychiatry

## 2022-11-09 DIAGNOSIS — F3175 Bipolar disorder, in partial remission, most recent episode depressed: Secondary | ICD-10-CM

## 2022-11-10 ENCOUNTER — Encounter (INDEPENDENT_AMBULATORY_CARE_PROVIDER_SITE_OTHER): Payer: Self-pay | Admitting: *Deleted

## 2022-11-10 ENCOUNTER — Encounter (INDEPENDENT_AMBULATORY_CARE_PROVIDER_SITE_OTHER): Payer: Self-pay | Admitting: Gastroenterology

## 2022-11-10 ENCOUNTER — Ambulatory Visit (INDEPENDENT_AMBULATORY_CARE_PROVIDER_SITE_OTHER): Payer: 59 | Admitting: Gastroenterology

## 2022-11-11 ENCOUNTER — Encounter (HOSPITAL_COMMUNITY): Payer: Self-pay | Admitting: Family Medicine

## 2022-11-11 ENCOUNTER — Other Ambulatory Visit (HOSPITAL_COMMUNITY): Payer: Self-pay | Admitting: Internal Medicine

## 2022-11-11 DIAGNOSIS — M25511 Pain in right shoulder: Secondary | ICD-10-CM

## 2022-11-11 DIAGNOSIS — M4854XA Collapsed vertebra, not elsewhere classified, thoracic region, initial encounter for fracture: Secondary | ICD-10-CM

## 2022-11-11 DIAGNOSIS — M4854XD Collapsed vertebra, not elsewhere classified, thoracic region, subsequent encounter for fracture with routine healing: Secondary | ICD-10-CM | POA: Diagnosis not present

## 2022-11-11 DIAGNOSIS — J069 Acute upper respiratory infection, unspecified: Secondary | ICD-10-CM | POA: Diagnosis not present

## 2022-11-11 DIAGNOSIS — X58XXXD Exposure to other specified factors, subsequent encounter: Secondary | ICD-10-CM | POA: Diagnosis not present

## 2022-11-16 DIAGNOSIS — J449 Chronic obstructive pulmonary disease, unspecified: Secondary | ICD-10-CM | POA: Diagnosis not present

## 2022-12-17 DIAGNOSIS — J449 Chronic obstructive pulmonary disease, unspecified: Secondary | ICD-10-CM | POA: Diagnosis not present

## 2023-01-12 ENCOUNTER — Ambulatory Visit (INDEPENDENT_AMBULATORY_CARE_PROVIDER_SITE_OTHER): Payer: 59 | Admitting: Gastroenterology

## 2023-01-16 DIAGNOSIS — J449 Chronic obstructive pulmonary disease, unspecified: Secondary | ICD-10-CM | POA: Diagnosis not present

## 2023-01-18 ENCOUNTER — Ambulatory Visit (INDEPENDENT_AMBULATORY_CARE_PROVIDER_SITE_OTHER): Payer: 59 | Admitting: Gastroenterology

## 2023-02-16 DIAGNOSIS — J449 Chronic obstructive pulmonary disease, unspecified: Secondary | ICD-10-CM | POA: Diagnosis not present

## 2023-03-03 ENCOUNTER — Other Ambulatory Visit (HOSPITAL_COMMUNITY): Payer: Self-pay | Admitting: Psychiatry

## 2023-03-03 DIAGNOSIS — F250 Schizoaffective disorder, bipolar type: Secondary | ICD-10-CM

## 2023-03-19 DIAGNOSIS — J449 Chronic obstructive pulmonary disease, unspecified: Secondary | ICD-10-CM | POA: Diagnosis not present

## 2023-04-02 ENCOUNTER — Other Ambulatory Visit (HOSPITAL_COMMUNITY): Payer: Self-pay | Admitting: Psychiatry

## 2023-04-02 DIAGNOSIS — F250 Schizoaffective disorder, bipolar type: Secondary | ICD-10-CM

## 2023-04-16 DIAGNOSIS — R110 Nausea: Secondary | ICD-10-CM | POA: Diagnosis not present

## 2023-04-16 DIAGNOSIS — I10 Essential (primary) hypertension: Secondary | ICD-10-CM | POA: Diagnosis not present

## 2023-04-16 DIAGNOSIS — E039 Hypothyroidism, unspecified: Secondary | ICD-10-CM | POA: Diagnosis not present

## 2023-04-16 DIAGNOSIS — G894 Chronic pain syndrome: Secondary | ICD-10-CM | POA: Diagnosis not present

## 2023-04-16 DIAGNOSIS — J449 Chronic obstructive pulmonary disease, unspecified: Secondary | ICD-10-CM | POA: Diagnosis not present

## 2023-04-16 DIAGNOSIS — N1832 Chronic kidney disease, stage 3b: Secondary | ICD-10-CM | POA: Diagnosis not present

## 2023-04-16 DIAGNOSIS — I129 Hypertensive chronic kidney disease with stage 1 through stage 4 chronic kidney disease, or unspecified chronic kidney disease: Secondary | ICD-10-CM | POA: Diagnosis not present

## 2023-04-16 DIAGNOSIS — G43909 Migraine, unspecified, not intractable, without status migrainosus: Secondary | ICD-10-CM | POA: Diagnosis not present

## 2023-04-16 DIAGNOSIS — R7301 Impaired fasting glucose: Secondary | ICD-10-CM | POA: Diagnosis not present

## 2023-04-18 DIAGNOSIS — J449 Chronic obstructive pulmonary disease, unspecified: Secondary | ICD-10-CM | POA: Diagnosis not present

## 2023-05-07 ENCOUNTER — Other Ambulatory Visit (HOSPITAL_COMMUNITY): Payer: Self-pay | Admitting: Psychiatry

## 2023-05-07 DIAGNOSIS — F4310 Post-traumatic stress disorder, unspecified: Secondary | ICD-10-CM

## 2023-05-19 DIAGNOSIS — J449 Chronic obstructive pulmonary disease, unspecified: Secondary | ICD-10-CM | POA: Diagnosis not present

## 2023-05-22 ENCOUNTER — Emergency Department (HOSPITAL_COMMUNITY): Payer: 59

## 2023-05-22 ENCOUNTER — Other Ambulatory Visit: Payer: Self-pay

## 2023-05-22 ENCOUNTER — Inpatient Hospital Stay (HOSPITAL_COMMUNITY)
Admission: EM | Admit: 2023-05-22 | Discharge: 2023-05-25 | DRG: 964 | Disposition: A | Discharge status: Skilled Nursing Facility | Payer: 59 | Attending: Internal Medicine | Admitting: Internal Medicine

## 2023-05-22 ENCOUNTER — Encounter (HOSPITAL_COMMUNITY): Payer: Self-pay

## 2023-05-22 DIAGNOSIS — Z9109 Other allergy status, other than to drugs and biological substances: Secondary | ICD-10-CM | POA: Diagnosis not present

## 2023-05-22 DIAGNOSIS — F250 Schizoaffective disorder, bipolar type: Secondary | ICD-10-CM

## 2023-05-22 DIAGNOSIS — S32512A Fracture of superior rim of left pubis, initial encounter for closed fracture: Principal | ICD-10-CM

## 2023-05-22 DIAGNOSIS — Z87891 Personal history of nicotine dependence: Secondary | ICD-10-CM

## 2023-05-22 DIAGNOSIS — R404 Transient alteration of awareness: Secondary | ICD-10-CM | POA: Diagnosis not present

## 2023-05-22 DIAGNOSIS — Z882 Allergy status to sulfonamides status: Secondary | ICD-10-CM | POA: Diagnosis not present

## 2023-05-22 DIAGNOSIS — W1830XA Fall on same level, unspecified, initial encounter: Secondary | ICD-10-CM | POA: Diagnosis present

## 2023-05-22 DIAGNOSIS — Z818 Family history of other mental and behavioral disorders: Secondary | ICD-10-CM

## 2023-05-22 DIAGNOSIS — R9431 Abnormal electrocardiogram [ECG] [EKG]: Secondary | ICD-10-CM | POA: Diagnosis present

## 2023-05-22 DIAGNOSIS — M6289 Other specified disorders of muscle: Secondary | ICD-10-CM | POA: Diagnosis not present

## 2023-05-22 DIAGNOSIS — Z885 Allergy status to narcotic agent status: Secondary | ICD-10-CM

## 2023-05-22 DIAGNOSIS — J4489 Other specified chronic obstructive pulmonary disease: Secondary | ICD-10-CM | POA: Diagnosis not present

## 2023-05-22 DIAGNOSIS — M6282 Rhabdomyolysis: Secondary | ICD-10-CM | POA: Diagnosis present

## 2023-05-22 DIAGNOSIS — Z881 Allergy status to other antibiotic agents status: Secondary | ICD-10-CM | POA: Diagnosis not present

## 2023-05-22 DIAGNOSIS — K589 Irritable bowel syndrome without diarrhea: Secondary | ICD-10-CM | POA: Diagnosis present

## 2023-05-22 DIAGNOSIS — M549 Dorsalgia, unspecified: Secondary | ICD-10-CM | POA: Diagnosis not present

## 2023-05-22 DIAGNOSIS — Z888 Allergy status to other drugs, medicaments and biological substances status: Secondary | ICD-10-CM | POA: Diagnosis not present

## 2023-05-22 DIAGNOSIS — R278 Other lack of coordination: Secondary | ICD-10-CM | POA: Diagnosis not present

## 2023-05-22 DIAGNOSIS — N179 Acute kidney failure, unspecified: Secondary | ICD-10-CM | POA: Diagnosis present

## 2023-05-22 DIAGNOSIS — Z79899 Other long term (current) drug therapy: Secondary | ICD-10-CM | POA: Diagnosis not present

## 2023-05-22 DIAGNOSIS — Z90710 Acquired absence of both cervix and uterus: Secondary | ICD-10-CM

## 2023-05-22 DIAGNOSIS — S32592A Other specified fracture of left pubis, initial encounter for closed fracture: Secondary | ICD-10-CM | POA: Diagnosis present

## 2023-05-22 DIAGNOSIS — I10 Essential (primary) hypertension: Secondary | ICD-10-CM | POA: Diagnosis present

## 2023-05-22 DIAGNOSIS — E871 Hypo-osmolality and hyponatremia: Secondary | ICD-10-CM | POA: Diagnosis present

## 2023-05-22 DIAGNOSIS — R531 Weakness: Secondary | ICD-10-CM | POA: Diagnosis not present

## 2023-05-22 DIAGNOSIS — Z880 Allergy status to penicillin: Secondary | ICD-10-CM

## 2023-05-22 DIAGNOSIS — R410 Disorientation, unspecified: Secondary | ICD-10-CM | POA: Diagnosis not present

## 2023-05-22 DIAGNOSIS — J440 Chronic obstructive pulmonary disease with acute lower respiratory infection: Secondary | ICD-10-CM | POA: Diagnosis present

## 2023-05-22 DIAGNOSIS — Z87892 Personal history of anaphylaxis: Secondary | ICD-10-CM

## 2023-05-22 DIAGNOSIS — J209 Acute bronchitis, unspecified: Secondary | ICD-10-CM | POA: Diagnosis present

## 2023-05-22 DIAGNOSIS — W010XXA Fall on same level from slipping, tripping and stumbling without subsequent striking against object, initial encounter: Secondary | ICD-10-CM | POA: Diagnosis present

## 2023-05-22 DIAGNOSIS — G894 Chronic pain syndrome: Secondary | ICD-10-CM | POA: Diagnosis present

## 2023-05-22 DIAGNOSIS — T796XXA Traumatic ischemia of muscle, initial encounter: Principal | ICD-10-CM | POA: Diagnosis present

## 2023-05-22 DIAGNOSIS — N138 Other obstructive and reflux uropathy: Secondary | ICD-10-CM | POA: Diagnosis present

## 2023-05-22 DIAGNOSIS — R339 Retention of urine, unspecified: Secondary | ICD-10-CM

## 2023-05-22 DIAGNOSIS — Z743 Need for continuous supervision: Secondary | ICD-10-CM | POA: Diagnosis not present

## 2023-05-22 DIAGNOSIS — S32592D Other specified fracture of left pubis, subsequent encounter for fracture with routine healing: Secondary | ICD-10-CM | POA: Diagnosis not present

## 2023-05-22 DIAGNOSIS — Z79890 Hormone replacement therapy (postmenopausal): Secondary | ICD-10-CM | POA: Diagnosis not present

## 2023-05-22 DIAGNOSIS — I6782 Cerebral ischemia: Secondary | ICD-10-CM | POA: Diagnosis not present

## 2023-05-22 DIAGNOSIS — S0990XA Unspecified injury of head, initial encounter: Secondary | ICD-10-CM | POA: Diagnosis not present

## 2023-05-22 DIAGNOSIS — E039 Hypothyroidism, unspecified: Secondary | ICD-10-CM | POA: Diagnosis present

## 2023-05-22 DIAGNOSIS — F259 Schizoaffective disorder, unspecified: Secondary | ICD-10-CM | POA: Diagnosis present

## 2023-05-22 DIAGNOSIS — Z7401 Bed confinement status: Secondary | ICD-10-CM | POA: Diagnosis not present

## 2023-05-22 DIAGNOSIS — Y92003 Bedroom of unspecified non-institutional (private) residence as the place of occurrence of the external cause: Secondary | ICD-10-CM | POA: Diagnosis not present

## 2023-05-22 DIAGNOSIS — Z9049 Acquired absence of other specified parts of digestive tract: Secondary | ICD-10-CM

## 2023-05-22 DIAGNOSIS — T50995A Adverse effect of other drugs, medicaments and biological substances, initial encounter: Secondary | ICD-10-CM | POA: Diagnosis present

## 2023-05-22 DIAGNOSIS — R262 Difficulty in walking, not elsewhere classified: Secondary | ICD-10-CM | POA: Diagnosis not present

## 2023-05-22 DIAGNOSIS — W19XXXA Unspecified fall, initial encounter: Secondary | ICD-10-CM | POA: Diagnosis not present

## 2023-05-22 LAB — CBC WITH DIFFERENTIAL/PLATELET
Abs Immature Granulocytes: 0.09 10*3/uL — ABNORMAL HIGH (ref 0.00–0.07)
Basophils Absolute: 0 10*3/uL (ref 0.0–0.1)
Basophils Relative: 0 %
Eosinophils Absolute: 0.1 10*3/uL (ref 0.0–0.5)
Eosinophils Relative: 1 %
HCT: 38.3 % (ref 36.0–46.0)
Hemoglobin: 11.9 g/dL — ABNORMAL LOW (ref 12.0–15.0)
Immature Granulocytes: 1 %
Lymphocytes Relative: 6 %
Lymphs Abs: 0.7 10*3/uL (ref 0.7–4.0)
MCH: 28.3 pg (ref 26.0–34.0)
MCHC: 31.1 g/dL (ref 30.0–36.0)
MCV: 91.2 fL (ref 80.0–100.0)
Monocytes Absolute: 1.4 10*3/uL — ABNORMAL HIGH (ref 0.1–1.0)
Monocytes Relative: 12 %
Neutro Abs: 9.9 10*3/uL — ABNORMAL HIGH (ref 1.7–7.7)
Neutrophils Relative %: 80 %
Platelets: 183 10*3/uL (ref 150–400)
RBC: 4.2 MIL/uL (ref 3.87–5.11)
RDW: 13.3 % (ref 11.5–15.5)
WBC: 12.2 10*3/uL — ABNORMAL HIGH (ref 4.0–10.5)
nRBC: 0 % (ref 0.0–0.2)

## 2023-05-22 LAB — URINALYSIS, ROUTINE W REFLEX MICROSCOPIC
Bilirubin Urine: NEGATIVE
Glucose, UA: NEGATIVE mg/dL
Ketones, ur: NEGATIVE mg/dL
Nitrite: NEGATIVE
Protein, ur: NEGATIVE mg/dL
Specific Gravity, Urine: 1.012 (ref 1.005–1.030)
pH: 5 (ref 5.0–8.0)

## 2023-05-22 LAB — BASIC METABOLIC PANEL
Anion gap: 12 (ref 5–15)
BUN: 50 mg/dL — ABNORMAL HIGH (ref 8–23)
CO2: 29 mmol/L (ref 22–32)
Calcium: 8.7 mg/dL — ABNORMAL LOW (ref 8.9–10.3)
Chloride: 93 mmol/L — ABNORMAL LOW (ref 98–111)
Creatinine, Ser: 1.37 mg/dL — ABNORMAL HIGH (ref 0.44–1.00)
GFR, Estimated: 42 mL/min — ABNORMAL LOW (ref >60)
Glucose, Bld: 120 mg/dL — ABNORMAL HIGH (ref 70–99)
Potassium: 3.6 mmol/L (ref 3.5–5.1)
Sodium: 134 mmol/L — ABNORMAL LOW (ref 135–145)

## 2023-05-22 LAB — CK
Total CK: 1597 U/L — ABNORMAL HIGH (ref 38–234)
Total CK: 1104 U/L — ABNORMAL HIGH (ref 38–234)

## 2023-05-22 MED ORDER — METHOCARBAMOL 500 MG PO TABS
750.0000 mg | ORAL_TABLET | Freq: Two times a day (BID) | ORAL | Status: DC
  Administered 2023-05-22 – 2023-05-25 (×7): 750 mg via ORAL
  Filled 2023-05-22 (×7): qty 2

## 2023-05-22 MED ORDER — HYDROMORPHONE HCL 1 MG/ML IJ SOLN
1.0000 mg | INTRAMUSCULAR | Status: DC | PRN
  Administered 2023-05-22 – 2023-05-23 (×4): 1 mg via INTRAVENOUS
  Filled 2023-05-22 (×4): qty 1

## 2023-05-22 MED ORDER — SODIUM CHLORIDE 0.9 % IV BOLUS
1000.0000 mL | Freq: Once | INTRAVENOUS | Status: AC
  Administered 2023-05-22: 1000 mL via INTRAVENOUS

## 2023-05-22 MED ORDER — ONDANSETRON HCL 4 MG PO TABS: 4.0000 mg | ORAL_TABLET | Freq: Four times a day (QID) | ORAL | Status: DC | PRN

## 2023-05-22 MED ORDER — ONDANSETRON HCL 4 MG/2ML IJ SOLN
4.0000 mg | Freq: Four times a day (QID) | INTRAMUSCULAR | Status: DC | PRN
  Administered 2023-05-23: 4 mg via INTRAVENOUS
  Filled 2023-05-22: qty 2

## 2023-05-22 MED ORDER — GABAPENTIN 300 MG PO CAPS
600.0000 mg | ORAL_CAPSULE | Freq: Two times a day (BID) | ORAL | Status: DC
  Administered 2023-05-22 – 2023-05-25 (×7): 600 mg via ORAL
  Filled 2023-05-22 (×8): qty 2

## 2023-05-22 MED ORDER — PANTOPRAZOLE SODIUM 40 MG PO TBEC
40.0000 mg | DELAYED_RELEASE_TABLET | Freq: Every day | ORAL | Status: DC
  Administered 2023-05-23 – 2023-05-25 (×3): 40 mg via ORAL
  Filled 2023-05-22 (×3): qty 1

## 2023-05-22 MED ORDER — FENTANYL CITRATE PF 50 MCG/ML IJ SOSY
50.0000 ug | PREFILLED_SYRINGE | Freq: Once | INTRAMUSCULAR | Status: AC
Start: 2023-05-22 — End: 2023-05-22
  Administered 2023-05-22: 50 ug via INTRAVENOUS
  Filled 2023-05-22: qty 1

## 2023-05-22 MED ORDER — DOCUSATE SODIUM 100 MG PO CAPS
100.0000 mg | ORAL_CAPSULE | Freq: Two times a day (BID) | ORAL | Status: DC
  Administered 2023-05-22 – 2023-05-25 (×6): 100 mg via ORAL
  Filled 2023-05-22 (×7): qty 1

## 2023-05-22 MED ORDER — POLYETHYLENE GLYCOL 3350 17 G PO PACK: 17.0000 g | PACK | Freq: Every day | ORAL | Status: DC | PRN

## 2023-05-22 MED ORDER — MONTELUKAST SODIUM 10 MG PO TABS
10.0000 mg | ORAL_TABLET | Freq: Every day | ORAL | Status: DC
  Administered 2023-05-22 – 2023-05-25 (×4): 10 mg via ORAL
  Filled 2023-05-22 (×4): qty 1

## 2023-05-22 MED ORDER — ENOXAPARIN SODIUM 40 MG/0.4ML IJ SOSY
40.0000 mg | PREFILLED_SYRINGE | INTRAMUSCULAR | Status: DC
  Administered 2023-05-22 – 2023-05-25 (×4): 40 mg via SUBCUTANEOUS
  Filled 2023-05-22 (×4): qty 0.4

## 2023-05-22 MED ORDER — BISACODYL 10 MG RE SUPP: 10.0000 mg | Freq: Every day | RECTAL | Status: DC | PRN

## 2023-05-22 MED ORDER — ALBUTEROL SULFATE (2.5 MG/3ML) 0.083% IN NEBU: 2.5000 mg | INHALATION_SOLUTION | Freq: Four times a day (QID) | RESPIRATORY_TRACT | Status: DC | PRN

## 2023-05-22 MED ORDER — LACTATED RINGERS IV SOLN: INTRAVENOUS | Status: DC

## 2023-05-22 MED ORDER — LEVOTHYROXINE SODIUM 50 MCG PO TABS
50.0000 ug | ORAL_TABLET | Freq: Every day | ORAL | Status: DC
  Administered 2023-05-23 – 2023-05-25 (×3): 50 ug via ORAL
  Filled 2023-05-22 (×3): qty 1

## 2023-05-22 MED ORDER — ZIPRASIDONE HCL 40 MG PO CAPS
80.0000 mg | ORAL_CAPSULE | Freq: Two times a day (BID) | ORAL | Status: DC
  Administered 2023-05-23 – 2023-05-25 (×6): 80 mg via ORAL
  Filled 2023-05-22: qty 1
  Filled 2023-05-22: qty 2
  Filled 2023-05-22: qty 1
  Filled 2023-05-22: qty 2
  Filled 2023-05-22: qty 1
  Filled 2023-05-22 (×2): qty 2

## 2023-05-22 NOTE — ED Provider Notes (Signed)
[facility] EMERGENCY DEPARTMENT AT [facility] Provider Note   CSN: [id number] Arrival date & time: 05/22/23  1428     History  Chief Complaint  Patient presents with   Kristen Ryan    Kristen Ryan is a 67 y.o. female.  Patient presents to the ED with her son today post mechanical fall x 2 days complaining of left inguinal pain.  She states that she tripped over the dog and fell landing on her bottom.  Also complains of chronic back pain treated with Percocet of which she has taken 3 today. she is unable to ambulate due to pain however is forced to stop due to pain. Endorses dark urine today but is unsure if there was blood and difficulty urinating. Denies loss of consciousness, head injury, numbness or tingling in lower extremities and pelvic areas, abdominal pain, chest pain, shortness of breath, dizziness. States she is unable to evert or invert left foot due to pain.  Patient also states that right leg is slightly swollen.  However she slept with right leg off the bed and is normal when she does that.  Fall Associated symptoms include headaches (Chronic migraines unchanged from baseline). Pertinent negatives include no chest pain, no abdominal pain and no shortness of breath.       Home Medications Prior to Admission medications   Medication Sig Start Date End Date Taking? Authorizing Provider  acetaminophen (TYLENOL) 500 MG tablet Take 1,000 mg by mouth every 6 (six) hours as needed for mild pain (pain score 1-3).   Yes [provider]  albuterol (VENTOLIN HFA) 108 (90 Base) MCG/ACT inhaler Inhale 1-2 puffs into the lungs every 6 (six) hours as needed for wheezing or shortness of breath. 07/08/21  Yes Emokpae, Courage, MD  calcium-vitamin D (OSCAL WITH D) 500-200 MG-UNIT tablet Take 1 tablet by mouth 2 (two) times daily.   Yes [provider]  diclofenac Sodium (VOLTAREN) 1 % GEL Apply 2-4 g topically daily as needed (pain).   Yes [provider]   ferrous sulfate 325 (65 FE) MG tablet Take 325 mg by mouth 2 (two) times daily before a meal.   Yes [provider]  gabapentin (NEURONTIN) 600 MG tablet Take 1 tablet (600 mg total) by mouth 2 (two) times daily. TAKE (1) TABLET BY MOUTH TWICE DAILY. 09/07/22  Yes Elsie Lincoln, MD  GEMTESA 75 MG TABS Take 1 tablet by mouth daily. 03/03/23  Yes [provider]  levothyroxine (SYNTHROID) 50 MCG tablet Take 1 tablet by mouth daily. 12/27/18  Yes [provider]  lisinopril (ZESTRIL) 2.5 MG tablet Take 2.5 mg by mouth daily. 07/01/21  Yes [provider]  Magnesium 250 MG TABS Take 400 mg by mouth every evening.   Yes [provider]  meclizine (ANTIVERT) 25 MG tablet Take 25 mg by mouth 2 (two) times daily as needed for dizziness.   Yes [provider]  methocarbamol (ROBAXIN) 750 MG tablet Take 750 mg by mouth 2 (two) times daily.   Yes [provider]  montelukast (SINGULAIR) 10 MG tablet Take 10 mg by mouth at bedtime. 02/15/21  Yes [provider]  omeprazole (PRILOSEC) 40 MG capsule Take 40 mg by mouth 2 (two) times daily before a meal. 11/18/20  Yes [provider]  ondansetron (ZOFRAN-ODT) 4 MG disintegrating tablet 4 mg every 8 (eight) hours as needed for vomiting or nausea. Sublingual route 03/04/22  Yes [provider]  oxyCODONE-acetaminophen (PERCOCET) 10-325 MG tablet  Take 1 tablet by mouth every 8 (eight) hours. 05/17/23  Yes [provider]  ziprasidone (GEODON) 80 MG capsule Take 1 capsule (80 mg total) by mouth 2 (two) times daily with a meal. 09/07/22  Yes Elsie Lincoln, MD      Allergies    Aminophylline, Penicillins, Sumatriptan, Theophylline, Bee pollen, Codeine, Divalproex sodium, Divalproex sodium, Doxycycline, Hydroxyzine pamoate, Lamotrigine, Lithium, Metronidazole, Nabumetone, Naproxen, Other, Pentazocine lactate, Risperidone, Seroquel [quetiapine],  Sulfamethoxazole-trimethoprim, Tegretol [carbamazepine], Vistaril [hydroxyzine hcl], Clonidine derivatives, Tetracycline, and Zyprexa [olanzapine]    Review of Systems   Review of Systems  Constitutional:  Negative for chills, fatigue and fever.  Respiratory:  Negative for chest tightness and shortness of breath.   Cardiovascular:  Negative for chest pain.  Gastrointestinal:  Negative for abdominal pain, rectal pain and vomiting.  Genitourinary:  Positive for difficulty urinating. Negative for dysuria and vaginal bleeding.  Musculoskeletal:  Positive for back pain.  Neurological:  Positive for headaches (Chronic migraines unchanged from baseline). Negative for dizziness, seizures, syncope, speech difficulty and weakness.    Physical Exam Updated Vital Signs BP 109/62 (BP Location: Left Arm)   Pulse 94   Temp 99 F (37.2 C) (Oral)   Resp 18   Ht 5\' 7"  (1.702 m)   Wt 60.2 kg   SpO2 100%   BMI 20.79 kg/m  Physical Exam Constitutional:      Appearance: Normal appearance.  Cardiovascular:     Rate and Rhythm: Normal rate.     Pulses: Normal pulses.  Pulmonary:     Effort: Pulmonary effort is normal.     Breath sounds: Normal breath sounds.  Abdominal:     General: Abdomen is flat. There is distension (Bladder felt upon palpation).     Palpations: Abdomen is soft.  Musculoskeletal:     Right lower leg: Edema present.  Skin:    General: Skin is warm and dry.  Neurological:     General: No focal deficit present.     Mental Status: She is alert and oriented to person, place, and time. Mental status is at baseline.     ED Results / Procedures / Treatments   Labs (all labs ordered are listed, but only abnormal results are displayed) Labs Reviewed  URINALYSIS, ROUTINE W REFLEX MICROSCOPIC - Abnormal; Notable for the following components:      Result Value   Hgb urine dipstick SMALL (*)    Leukocytes,Ua TRACE (*)    Bacteria, UA RARE (*)    All other components within  normal limits  BASIC METABOLIC PANEL - Abnormal; Notable for the following components:   Sodium 134 (*)    Chloride 93 (*)    Glucose, Bld 120 (*)    BUN 50 (*)    Creatinine, Ser 1.37 (*)    Calcium 8.7 (*)    GFR, Estimated 42 (*)    All other components within normal limits  CBC WITH DIFFERENTIAL/PLATELET - Abnormal; Notable for the following components:   WBC 12.2 (*)    Hemoglobin 11.9 (*)    Neutro Abs 9.9 (*)    Monocytes Absolute 1.4 (*)    Abs Immature Granulocytes 0.09 (*)    All other components within normal limits  CK - Abnormal; Notable for the following components:   Total CK 1,597 (*)    All other components within normal limits  CK - Abnormal; Notable for the following components:   Total CK 1,104 (*)    All other components within normal  limits  CK  HIV ANTIBODY (ROUTINE TESTING W REFLEX)    EKG EKG Interpretation Date/Time:  Saturday May 22 2023 14:43:49 EST Ventricular Rate:  78 PR Interval:  149 QRS Duration:  96 QT Interval:  500 QTC Calculation: 570 R Axis:   77  Text Interpretation: Sinus rhythm Atrial premature complex ST elevation, consider inferior injury Prolonged QT interval Confirmed by Eber Hong ([id number]) on 05/22/2023 2:59:38 PM  Radiology DG HIP UNILAT WITH PELVIS 2-3 VIEWS LEFT  Result Date: 05/22/2023 CLINICAL DATA:  Pelvic pain after fall EXAM: DG HIP (WITH OR WITHOUT PELVIS) 2-3V LEFT COMPARISON:  09/10/2020 FINDINGS: Acute fracture of the left superior pubic ramus at the pubic root without significant displacement. A corresponding inferior pubic ramus fracture, although expected, is not definitively seen. Left hip intact without fracture or dislocation. No pelvic diastasis. IMPRESSION: Acute fracture of the left superior pubic ramus at the pubic root without significant displacement. Electronically Signed   By: Duanne Guess D.O.   On: 05/22/2023 17:19   DG Lumbar Spine Complete  Result Date: 05/22/2023 CLINICAL DATA:   Back pain post fall. EXAM: LUMBAR SPINE - COMPLETE 4+ VIEW COMPARISON:  None Available. FINDINGS: There is no evidence of lumbar spine fracture. Alignment is normal. Intervertebral disc spaces are maintained. IMPRESSION: Negative. Electronically Signed   By: Ted Mcalpine M.D.   On: 05/22/2023 17:19    Procedures Procedures   Medications Ordered in ED Medications  lactated ringers infusion ( Intravenous New Bag/Given 05/22/23 2114)  ziprasidone (GEODON) capsule 80 mg (has no administration in time range)  levothyroxine (SYNTHROID) tablet 50 mcg (has no administration in time range)  pantoprazole (PROTONIX) EC tablet 40 mg (has no administration in time range)  gabapentin (NEURONTIN) capsule 600 mg (600 mg Oral Given 05/22/23 2114)  methocarbamol (ROBAXIN) tablet 750 mg (750 mg Oral Given 05/22/23 2114)  albuterol (PROVENTIL) (2.5 MG/3ML) 0.083% nebulizer solution 2.5 mg (has no administration in time range)  montelukast (SINGULAIR) tablet 10 mg (10 mg Oral Given 05/22/23 2114)  enoxaparin (LOVENOX) injection 40 mg (40 mg Subcutaneous Given 05/22/23 2114)  HYDROmorphone (DILAUDID) injection 1 mg (1 mg Intravenous Given 05/22/23 1930)  docusate sodium (COLACE) capsule 100 mg (100 mg Oral Given 05/22/23 2114)  polyethylene glycol (MIRALAX / GLYCOLAX) packet 17 g (has no administration in time range)  bisacodyl (DULCOLAX) suppository 10 mg (has no administration in time range)  ondansetron (ZOFRAN) tablet 4 mg (has no administration in time range)    Or  ondansetron (ZOFRAN) injection 4 mg (has no administration in time range)  sodium chloride 0.9 % bolus 1,000 mL (0 mLs Intravenous Stopped 05/22/23 1858)  fentaNYL (SUBLIMAZE) injection 50 mcg (50 mcg Intravenous Given 05/22/23 1738)    ED Course/ Medical Decision Making/ A&P                                 Medical Decision Making    Patient presents to the ED for concern of left lower extremity fracture This involves an  extensive number of treatment options, and is a complaint that carries with it a high risk of complications and morbidity.  The differential diagnosis includes femoral head fracture, hip fracture, rhabdo.    Co morbidities that complicate the patient evaluation  Schizoaffective disorder Chronic pain syndrome Lumbar degenerative disease   Additional history obtained:  Additional history obtained from  Family, Nursing, Outside Medical Records, and Past Admission   External  records from outside source obtained and reviewed including review psychiatry note, family.   Lab Tests:  I Ordered, and personally interpreted labs.  The pertinent results include:   WBC 12.2 CK 1597 BUN 50 Creatinine 1.37 GFR 42   Imaging Studies ordered:  I ordered imaging studies including x-ray of lumbar spine, hip with pelvis I independently visualized and interpreted imaging which showed acute fracture of the left superior pubic ramus at the pubic root without significant displacement. A corresponding inferior pubic ramus fracture.  I agree with the radiologist interpretation   Cardiac Monitoring:  The patient was maintained on a cardiac monitor.  I personally viewed and interpreted the cardiac monitored which showed an underlying rhythm of: Regular normal sinus rhythm   Medicines ordered and prescription drug management:  I ordered medication including fentanyl 50 mcg for pain and 1000 cc bolus of NS for rhabdo Reevaluation of the patient after these medicines showed that the patient improved I have reviewed the patients home medicines and have made adjustments as needed   Critical Interventions:  1000 cc IV bolus --given for rhabdomyolysis/acute kidney injury. Urinary catheter --for urinary retention Pain control --fentanyl 50 mcg were given intravenously    Consultations Obtained: I requested consultation with the attending,  and discussed lab and imaging findings as well as pertinent  plan - they recommend: Admission with continued IV fluids, PT, rehab   Problem List / ED Course:  AKI --patient is a 67 year old female presenting post chemical fall x 2 days.  She fell onto her buttocks with no loss of consciousness/head injury.  Complaining of left inguinal pain worsening over the last 2 days.  Unable to ambulate and endorses the urinary retention despite urgency.  She states that she experienced dark brown urine earlier today.  Ultrasound showed around 1000 cc of urine present in bladder despite attempts to urinate.  BMP showed elevated BUN and creatinine and CK was elevated.  1000 cc bolus of NS were given and urinary catheter was placed.  Left-sided pelvic fracture --same history as above.  X-ray showed left-sided superior pubic ramus fracture without significant displacement with the corresponding inferior pubic ramus fracture.  Continue care will be handled upon admission.    Reevaluation:  After the interventions noted above, I reevaluated the patient and found that they have :improved   Social Determinants of Health:  Previous Xanax misuse   Dispostion:  After consideration of the diagnostic results and the patients response to treatment, I feel that the patent would benefit from admission.     Final Clinical Impression(s) / ED Diagnoses Final diagnoses:  Closed fracture of superior ramus of left pubis, initial encounter (HCC)  AKI (acute kidney injury) (HCC)  Traumatic rhabdomyolysis, initial encounter [facility])  Urinary retention    Rx / DC Orders ED Discharge Orders     None         Lavonia Drafts 05/22/23 2241    Eber Hong, MD 05/23/23 [phone]

## 2023-05-22 NOTE — ED Notes (Signed)
ED TO INPATIENT HANDOFF REPORT  ED Nurse Name and Phone #: Asher Muir [phone]  S Name/Age/Gender Kristen Ryan 67 y.o. female Room/Bed: APA05/APA05  Code Status   Code Status: Prior  Home/SNF/Other Rehab Patient oriented to: self, place, time, and situation Is this baseline? Yes   Triage Complete: Triage complete  Chief Complaint Rhabdomyolysis [M62.82]  Triage Note Pt arrived [facility] for a fall yesterday and a fall today. Pt c/o back pain, bilateral hips and groin areas. Pt uses  a walker Nurse noted right ankle more swollen than left.    Allergies Allergies  Allergen Reactions   Aminophylline Anaphylaxis   Penicillins Anaphylaxis, Hives and Nausea And Vomiting    immediate rash, facial/tongue/throat swelling, SOB or lightheadedness with hypotension   Sumatriptan Anaphylaxis and Other (See Comments)    Causes fainting   Theophylline Anaphylaxis   Bee Pollen Other (See Comments)   Codeine Itching and Other (See Comments)    Too strong for patient   Divalproex Sodium Other (See Comments)    Causes hair to fall out   Divalproex Sodium Nausea Only and Other (See Comments)    Hair loss  Causes hair to fall out   Doxycycline Other (See Comments)    headache   Hydroxyzine Pamoate    Lamotrigine Other (See Comments)    Destroys thyroid   Lithium Other (See Comments)    Adverse reaction to Thyroid    Magnesium-Containing Compounds Hives   Metronidazole Other (See Comments)   Nabumetone Swelling   Naproxen Swelling   Other Other (See Comments)    Hair dye   Pentazocine Lactate Other (See Comments)    Unknown reaction   Risperidone Other (See Comments)    Insomnia    Seroquel [Quetiapine] Swelling   Sulfamethoxazole-Trimethoprim Other (See Comments)    Pt unsure, long time ago   Tegretol [Carbamazepine] Nausea And Vomiting   Vistaril [Hydroxyzine Hcl] Nausea And Vomiting   Clonidine Derivatives     Dizziness at 0.1 mg   Tetracycline Rash    Headaches   Zyprexa  [Olanzapine] Rash and Other (See Comments)    Insomnia    Level of Care/Admitting Diagnosis ED Disposition     ED Disposition  Admit   Condition  --   Comment  Hospital Area: [facility] [[id number]]  Level of Care: Med-Surg [16]  Covid Evaluation: Asymptomatic - no recent exposure (last 10 days) testing not required  Diagnosis: Rhabdomyolysis [728.88.ICD-9-CM]  Admitting Physician: Levie Heritage [[id number]]  Attending Physician: Levie Heritage [[id number]]  Certification:: I certify this patient will need inpatient services for at least 2 midnights  Expected Medical Readiness: 05/25/2023          B Medical/Surgery History Past Medical History:  Diagnosis Date   Acute acalculous cholecystitis 07/06/2021   Acute respiratory failure with hypoxia (HCC) 08/23/2015   Anemia    Asthma    Bipolar 1 disorder (HCC)    Bipolar 1 disorder (HCC) 02/14/2015   Bipolar 1 disorder, depressed, moderate (HCC) 02/15/2020   Bipolar 1 disorder, mixed, moderate (HCC) 08/27/2016   Bipolar disorder in partial remission (HCC)    Bronchitis    COLLES' FRACTURE, LEFT WRIST 03/27/2008   Qualifier: Diagnosis of   By: Romeo Apple MD, Duffy Rhody       COPD (chronic obstructive pulmonary disease) (HCC)    Diverticulitis    Hypertension    IBS (irritable bowel syndrome)    Migraine    Neuropathy    Schizophrenia (HCC)  Sigmoid diverticulitis 08/03/2015   Thyroid disease    Past Surgical History:  Procedure Laterality Date   ABDOMINAL HYSTERECTOMY     broke left arm     CESAREAN SECTION     CHOLECYSTECTOMY N/A 07/08/2021   Procedure: LAPAROSCOPIC CHOLECYSTECTOMY;  Surgeon: Lewie Chamber, DO;  Location: [facility];  Service: General;  Laterality: N/A;   COLONOSCOPY N/A 08/30/2014   Procedure: COLONOSCOPY;  Surgeon: Malissa Hippo, MD;  Location: [facility];  Service: Endoscopy;  Laterality: N/A;  1200   ELBOW SURGERY     FOOT SURGERY     KNEE SURGERY     TONSILLECTOMY       A IV  Location/Drains/Wounds Patient Lines/Drains/Airways Status     Active Line/Drains/Airways     Name Placement date Placement time Site Days   Peripheral IV 05/22/23 20 G 1" Right Antecubital 05/22/23  1738  Antecubital  less than 1   Urethral Catheter John[place] P, NT+2 Non-latex 14 Fr. 05/22/23  1758  Non-latex  less than 1   Incision (Closed) 07/08/21 Abdomen Other (Comment) 07/08/21  0856  -- 683   Incision - 4 Ports Abdomen Umbilicus Mid;Upper Right;Upper Right;Lateral;Lower 07/08/21  0845  -- 683            Intake/Output Last 24 hours  Intake/Output Summary (Last 24 hours) at 05/22/2023 1813 Last data filed at 05/22/2023 1800 Gross per 24 hour  Intake --  Output 793 ml  Net -793 ml    Labs/Imaging Results for orders placed or performed during the hospital encounter of 05/22/23 (from the past 48 hour(s))  Basic metabolic panel     Status: Abnormal   Collection Time: 05/22/23  3:58 PM  Result Value Ref Range   Sodium 134 (L) 135 - 145 mmol/L   Potassium 3.6 3.5 - 5.1 mmol/L   Chloride 93 (L) 98 - 111 mmol/L   CO2 29 22 - 32 mmol/L   Glucose, Bld 120 (H) 70 - 99 mg/dL    Comment: Glucose reference range applies only to samples taken after fasting for at least 8 hours.   BUN 50 (H) 8 - 23 mg/dL   Creatinine, Ser 7.82 (H) 0.44 - 1.00 mg/dL   Calcium 8.7 (L) 8.9 - 10.3 mg/dL   GFR, Estimated 42 (L) >60 mL/min    Comment: (NOTE) Calculated using the CKD-EPI Creatinine Equation (2021)    Anion gap 12 5 - 15    Comment: Performed at [facility], [address], [place], [place] [address]  CBC with Differential     Status: Abnormal   Collection Time: 05/22/23  3:58 PM  Result Value Ref Range   WBC 12.2 (H) 4.0 - 10.5 K/uL   RBC 4.20 3.87 - 5.11 MIL/uL   Hemoglobin 11.9 (L) 12.0 - 15.0 g/dL   HCT 30.8 65.7 - 84.6 %   MCV 91.2 80.0 - 100.0 fL   MCH 28.3 26.0 - 34.0 pg   MCHC 31.1 30.0 - 36.0 g/dL   RDW 96.2 95.2 - 84.1 %   Platelets 183 150 - 400 K/uL   nRBC 0.0 0.0 -  0.2 %   Neutrophils Relative % 80 %   Neutro Abs 9.9 (H) 1.7 - 7.7 K/uL   Lymphocytes Relative 6 %   Lymphs Abs 0.7 0.7 - 4.0 K/uL   Monocytes Relative 12 %   Monocytes Absolute 1.4 (H) 0.1 - 1.0 K/uL   Eosinophils Relative 1 %   Eosinophils Absolute 0.1 0.0 -  0.5 K/uL   Basophils Relative 0 %   Basophils Absolute 0.0 0.0 - 0.1 K/uL   Immature Granulocytes 1 %   Abs Immature Granulocytes 0.09 (H) 0.00 - 0.07 K/uL    Comment: Performed at [facility], [address] [place], [place] [address]  CK     Status: Abnormal   Collection Time: 05/22/23  4:29 PM  Result Value Ref Range   Total CK 1,597 (H) 38 - 234 U/L    Comment: Performed at [facility], [address] [place], [place] [address]   DG HIP UNILAT WITH PELVIS 2-3 VIEWS LEFT  Result Date: 05/22/2023 CLINICAL DATA:  Pelvic pain after fall EXAM: DG HIP (WITH OR WITHOUT PELVIS) 2-3V LEFT COMPARISON:  09/10/2020 FINDINGS: Acute fracture of the left superior pubic ramus at the pubic root without significant displacement. A corresponding inferior pubic ramus fracture, although expected, is not definitively seen. Left hip intact without fracture or dislocation. No pelvic diastasis. IMPRESSION: Acute fracture of the left superior pubic ramus at the pubic root without significant displacement. Electronically Signed   By: Duanne Guess D.O.   On: 05/22/2023 17:19   DG Lumbar Spine Complete  Result Date: 05/22/2023 CLINICAL DATA:  Back pain post fall. EXAM: LUMBAR SPINE - COMPLETE 4+ VIEW COMPARISON:  None Available. FINDINGS: There is no evidence of lumbar spine fracture. Alignment is normal. Intervertebral disc spaces are maintained. IMPRESSION: Negative. Electronically Signed   By: Ted Mcalpine M.D.   On: 05/22/2023 17:19    Pending Labs Unresulted Labs (From admission, onward)     Start     Ordered   05/22/23 2100  CK  Now then every 8 hours,   R (with TIMED occurrences)      05/22/23 1752   05/22/23 1541  Urinalysis,  Routine w reflex microscopic -Urine, Clean Catch  Once,   URGENT       Question:  Specimen Source  Answer:  Urine, Clean Catch   05/22/23 1545            Vitals/Pain Today's Vitals   05/22/23 1545 05/22/23 1600 05/22/23 1615 05/22/23 1801  BP: 126/61 (!) 118/49 (!) 104/44   Pulse: 80 84 85   Resp:      Temp:      TempSrc:      SpO2: 92% 92% 93%   Weight:      Height:      PainSc:    8     Isolation Precautions No active isolations  Medications Medications  lactated ringers infusion (has no administration in time range)  sodium chloride 0.9 % bolus 1,000 mL (1,000 mLs Intravenous Bolus 05/22/23 1753)  fentaNYL (SUBLIMAZE) injection 50 mcg (50 mcg Intravenous Given 05/22/23 1738)    Mobility Patient normaly mobile with walker unable to walk to day due to a fall from fracture.     Focused Assessments Muscularskeletal   R Recommendations: See Admitting Provider Note  Report given to:   Additional Notes:  Entered patient room to scan patients bladder, Patient was about to take a medication that was not given by ED staff. Patients son had brought patient medications in and gave her an Oxycodone to take. Took medication and put back in medication bottle and notified Dr. Hyacinth Meeker and Jennette Kettle RN. [facility] notified. Informed Patients son and patient he needed to take medications to the car or we will need to take them and lock them up. Son took then to Set designer after MD spoke with patient.  This nurse re-informed of the need to take the medications to the car. That we will be giving patient pain medication through an IV. Patients son took medications to car.

## 2023-05-22 NOTE — ED Triage Notes (Signed)
Pt arrived [facility] for a fall yesterday and a fall today. Pt c/o back pain, bilateral hips and groin areas. Pt uses  a walker Nurse noted right ankle more swollen than left.

## 2023-05-22 NOTE — ED Notes (Signed)
Patient placed on 2L nasal cannula, Oxygen stat dropped to 88 will foley was being placed. PA-C notified.

## 2023-05-22 NOTE — ED Notes (Signed)
Entered patient room to scan patients bladder, Patient was about to take a medication that was not given by ED staff. Patients son had brought patient medications in and gave her an Oxycodone to take. Took medication and put back in medication bottle and notified Dr. Hyacinth Meeker and Jennette Kettle RN. [facility] notified. Informed Patients son and patient he needed to take medications to the car or we will need to take them and lock them up. Son took then to Set designer after MD spoke with patient. This nurse re-informed of the need to take the medications to the car. That we will be giving patient pain medication through an IV. Patients son took medications to car.

## 2023-05-22 NOTE — Progress Notes (Signed)
Attempted to call for report x1

## 2023-05-22 NOTE — H&P (Signed)
History and Physical    Patient: Kristen Ryan:[id number] DOB: 1956-01-11 DOA: 05/22/2023 DOS: the patient was seen and examined on 05/22/2023 PCP: Leone Payor, FNP  Patient coming from: Home  Chief Complaint:  Chief Complaint  Patient presents with   Fall   HPI: Kristen Ryan is a 67 y.o. female with medical history significant of COPD, bipolar, hypertension, hypothyroidism, IBS.  Patient seen after a fall on Wednesday.  Her son who lives with her heard the fall and saw her wedged by the dresser.  She was on the ground for just a couple of minutes.  She returned to bed and had a lot of pain in her pelvis and was unable to ambulate and has been in bed for the majority of the time since her fall.  She has had decreased appetite.  Has a lot of pelvic pressure and pain, which is worse with walking and ambulation.  She has not been peeing as frequently.  Review of Systems: As mentioned in the history of present illness. All other systems reviewed and are negative. Past Medical History:  Diagnosis Date   Acute acalculous cholecystitis 07/06/2021   Acute respiratory failure with hypoxia (HCC) 08/23/2015   Anemia    Asthma    Bipolar 1 disorder (HCC)    Bipolar 1 disorder (HCC) 02/14/2015   Bipolar 1 disorder, depressed, moderate (HCC) 02/15/2020   Bipolar 1 disorder, mixed, moderate (HCC) 08/27/2016   Bipolar disorder in partial remission (HCC)    Bronchitis    COLLES' FRACTURE, LEFT WRIST 03/27/2008   Qualifier: Diagnosis of   By: Romeo Apple MD, Duffy Rhody       COPD (chronic obstructive pulmonary disease) (HCC)    Diverticulitis    Hypertension    IBS (irritable bowel syndrome)    Migraine    Neuropathy    Schizophrenia (HCC)    Sigmoid diverticulitis 08/03/2015   Thyroid disease    Past Surgical History:  Procedure Laterality Date   ABDOMINAL HYSTERECTOMY     broke left arm     CESAREAN SECTION     CHOLECYSTECTOMY N/A 07/08/2021   Procedure: LAPAROSCOPIC  CHOLECYSTECTOMY;  Surgeon: Lewie Chamber, DO;  Location: [facility];  Service: General;  Laterality: N/A;   COLONOSCOPY N/A 08/30/2014   Procedure: COLONOSCOPY;  Surgeon: Malissa Hippo, MD;  Location[facility];  Service: Endoscopy;  Laterality: N/A;  1200   ELBOW SURGERY     FOOT SURGERY     KNEE SURGERY     TONSILLECTOMY     Social History:  reports that she quit smoking about 14 years ago. Her smoking use included cigarettes. She has never used smokeless tobacco. She reports current drug use. Drug: Benzodiazepines. She reports that she does not drink alcohol.  Allergies  Allergen Reactions   Aminophylline Anaphylaxis   Penicillins Anaphylaxis, Hives and Nausea And Vomiting    immediate rash, facial/tongue/throat swelling, SOB or lightheadedness with hypotension   Sumatriptan Anaphylaxis and Other (See Comments)    Causes fainting   Theophylline Anaphylaxis   Bee Pollen Other (See Comments)   Codeine Itching and Other (See Comments)    Too strong for patient   Divalproex Sodium Other (See Comments)    Causes hair to fall out   Divalproex Sodium Nausea Only and Other (See Comments)    Hair loss  Causes hair to fall out   Doxycycline Other (See Comments)    headache   Hydroxyzine Pamoate    Lamotrigine Other (See  Comments)    Destroys thyroid   Lithium Other (See Comments)    Adverse reaction to Thyroid    Metronidazole Other (See Comments)   Nabumetone Swelling   Naproxen Swelling   Other Other (See Comments)    Hair dye   Pentazocine Lactate Other (See Comments)    Unknown reaction   Risperidone Other (See Comments)    Insomnia    Seroquel [Quetiapine] Swelling   Sulfamethoxazole-Trimethoprim Other (See Comments)    Pt unsure, long time ago   Tegretol [Carbamazepine] Nausea And Vomiting   Vistaril [Hydroxyzine Hcl] Nausea And Vomiting   Clonidine Derivatives     Dizziness at 0.1 mg   Tetracycline Rash    Headaches   Zyprexa [Olanzapine] Rash and Other  (See Comments)    Insomnia    Family History  Problem Relation Age of Onset   Dementia Mother    Bipolar disorder Mother    Dementia Father    Other Son        MVA accident    Prior to Admission medications   Medication Sig Start Date End Date Taking? Authorizing Provider  acetaminophen (TYLENOL) 500 MG tablet Take 1,000 mg by mouth every 6 (six) hours as needed for mild pain (pain score 1-3).   Yes [provider]  albuterol (VENTOLIN HFA) 108 (90 Base) MCG/ACT inhaler Inhale 1-2 puffs into the lungs every 6 (six) hours as needed for wheezing or shortness of breath. 07/08/21  Yes Emokpae, Courage, MD  calcium-vitamin D (OSCAL WITH D) 500-200 MG-UNIT tablet Take 1 tablet by mouth 2 (two) times daily.   Yes [provider]  diclofenac Sodium (VOLTAREN) 1 % GEL Apply 2-4 g topically daily as needed (pain).   Yes [provider]  ferrous sulfate 325 (65 FE) MG tablet Take 325 mg by mouth 2 (two) times daily before a meal.   Yes [provider]  gabapentin (NEURONTIN) 600 MG tablet Take 1 tablet (600 mg total) by mouth 2 (two) times daily. TAKE (1) TABLET BY MOUTH TWICE DAILY. 09/07/22  Yes Elsie Lincoln, MD  GEMTESA 75 MG TABS Take 1 tablet by mouth daily. 03/03/23  Yes [provider]  levothyroxine (SYNTHROID) 50 MCG tablet Take 1 tablet by mouth daily. 12/27/18  Yes [provider]  lisinopril (ZESTRIL) 2.5 MG tablet Take 2.5 mg by mouth daily. 07/01/21  Yes [provider]  Magnesium 250 MG TABS Take 400 mg by mouth every evening.   Yes [provider]  meclizine (ANTIVERT) 25 MG tablet Take 25 mg by mouth 2 (two) times daily as needed for dizziness.   Yes [provider]  methocarbamol (ROBAXIN) 750 MG tablet Take 750 mg by mouth 2 (two) times daily.   Yes [provider]  montelukast (SINGULAIR) 10 MG tablet Take 10 mg by mouth at bedtime. 02/15/21  Yes [provider]  omeprazole (PRILOSEC)  40 MG capsule Take 40 mg by mouth 2 (two) times daily before a meal. 11/18/20  Yes [provider]  ondansetron (ZOFRAN-ODT) 4 MG disintegrating tablet 4 mg every 8 (eight) hours as needed for vomiting or nausea. Sublingual route 03/04/22  Yes [provider]  oxyCODONE-acetaminophen (PERCOCET) 10-325 MG tablet Take 1 tablet by mouth every 8 (eight) hours. 05/17/23  Yes [provider]  ziprasidone (GEODON) 80 MG capsule Take 1 capsule (80 mg total) by mouth 2 (two) times daily with a meal. 09/07/22  Yes Elsie Lincoln, MD    Physical  Exam: Vitals:   05/22/23 1530 05/22/23 1545 05/22/23 1600 05/22/23 1615  BP: (!) 89/55 126/61 (!) 118/49 (!) 104/44  Pulse: 76 80 84 85  Resp:      Temp:      TempSrc:      SpO2: 99% 92% 92% 93%  Weight:      Height:       General: Elderly female. Awake and alert and oriented x3. No acute cardiopulmonary distress.  HEENT: Normocephalic atraumatic.  Right and left ears normal in appearance.  Pupils equal, round, reactive to light. Extraocular muscles are intact. Sclerae anicteric and noninjected.  Moist mucosal membranes. No mucosal lesions.  Neck: Neck supple without lymphadenopathy. No carotid bruits. No masses palpated.  Cardiovascular: Regular rate with normal S1-S2 sounds. No murmurs, rubs, gallops auscultated. No JVD.  Respiratory: Good respiratory effort with no wheezes, rales, rhonchi. Lungs clear to auscultation bilaterally.  No accessory muscle use. Abdomen: Soft, nontender, nondistended.  Suprapubic pressure.  Active bowel sounds. No masses or hepatosplenomegaly  Skin: No rashes, lesions, or ulcerations.  Dry, warm to touch. 2+ dorsalis pedis and radial pulses. Musculoskeletal: No calf or leg pain.  Significant pain to palpation on pubic rami.  All major joints not erythematous nontender.  No upper or lower joint deformation.  Good ROM.  No contractures  Psychiatric: Intact judgment and insight. Pleasant and  cooperative. Neurologic: No focal neurological deficits. Strength is 5/5 and symmetric in upper and lower extremities.  Cranial nerves II through XII are grossly intact.  Data Reviewed: Results for orders placed or performed during the hospital encounter of 05/22/23 (from the past 24 hour(s))  Urinalysis, Routine w reflex microscopic -Urine, Clean Catch     Status: Abnormal   Collection Time: 05/22/23  3:41 PM  Result Value Ref Range   Color, Urine YELLOW YELLOW   APPearance CLEAR CLEAR   Specific Gravity, Urine 1.012 1.005 - 1.030   pH 5.0 5.0 - 8.0   Glucose, UA NEGATIVE NEGATIVE mg/dL   Hgb urine dipstick SMALL (A) NEGATIVE   Bilirubin Urine NEGATIVE NEGATIVE   Ketones, ur NEGATIVE NEGATIVE mg/dL   Protein, ur NEGATIVE NEGATIVE mg/dL   Nitrite NEGATIVE NEGATIVE   Leukocytes,Ua TRACE (A) NEGATIVE   RBC / HPF 0-5 0 - 5 RBC/hpf   WBC, UA 6-10 0 - 5 WBC/hpf   Bacteria, UA RARE (A) NONE SEEN   Squamous Epithelial / HPF 0-5 0 - 5 /HPF   Mucus PRESENT   Basic metabolic panel     Status: Abnormal   Collection Time: 05/22/23  3:58 PM  Result Value Ref Range   Sodium 134 (L) 135 - 145 mmol/L   Potassium 3.6 3.5 - 5.1 mmol/L   Chloride 93 (L) 98 - 111 mmol/L   CO2 29 22 - 32 mmol/L   Glucose, Bld 120 (H) 70 - 99 mg/dL   BUN 50 (H) 8 - 23 mg/dL   Creatinine, Ser 9.14 (H) 0.44 - 1.00 mg/dL   Calcium 8.7 (L) 8.9 - 10.3 mg/dL   GFR, Estimated 42 (L) >60 mL/min   Anion gap 12 5 - 15  CBC with Differential     Status: Abnormal   Collection Time: 05/22/23  3:58 PM  Result Value Ref Range   WBC 12.2 (H) 4.0 - 10.5 K/uL   RBC 4.20 3.87 - 5.11 MIL/uL   Hemoglobin 11.9 (L) 12.0 - 15.0 g/dL   HCT 78.2 95.6 - 21.3 %   MCV 91.2 80.0 - 100.0 fL  MCH 28.3 26.0 - 34.0 pg   MCHC 31.1 30.0 - 36.0 g/dL   RDW 16.1 09.6 - 04.5 %   Platelets 183 150 - 400 K/uL   nRBC 0.0 0.0 - 0.2 %   Neutrophils Relative % 80 %   Neutro Abs 9.9 (H) 1.7 - 7.7 K/uL   Lymphocytes Relative 6 %   Lymphs Abs 0.7  0.7 - 4.0 K/uL   Monocytes Relative 12 %   Monocytes Absolute 1.4 (H) 0.1 - 1.0 K/uL   Eosinophils Relative 1 %   Eosinophils Absolute 0.1 0.0 - 0.5 K/uL   Basophils Relative 0 %   Basophils Absolute 0.0 0.0 - 0.1 K/uL   Immature Granulocytes 1 %   Abs Immature Granulocytes 0.09 (H) 0.00 - 0.07 K/uL  CK     Status: Abnormal   Collection Time: 05/22/23  4:29 PM  Result Value Ref Range   Total CK 1,597 (H) 38 - 234 U/L    DG HIP UNILAT WITH PELVIS 2-3 VIEWS LEFT  Result Date: 05/22/2023 CLINICAL DATA:  Pelvic pain after fall EXAM: DG HIP (WITH OR WITHOUT PELVIS) 2-3V LEFT COMPARISON:  09/10/2020 FINDINGS: Acute fracture of the left superior pubic ramus at the pubic root without significant displacement. A corresponding inferior pubic ramus fracture, although expected, is not definitively seen. Left hip intact without fracture or dislocation. No pelvic diastasis. IMPRESSION: Acute fracture of the left superior pubic ramus at the pubic root without significant displacement. Electronically Signed   By: Duanne Guess D.O.   On: 05/22/2023 17:19   DG Lumbar Spine Complete  Result Date: 05/22/2023 CLINICAL DATA:  Back pain post fall. EXAM: LUMBAR SPINE - COMPLETE 4+ VIEW COMPARISON:  None Available. FINDINGS: There is no evidence of lumbar spine fracture. Alignment is normal. Intervertebral disc spaces are maintained. IMPRESSION: Negative. Electronically Signed   By: Ted Mcalpine M.D.   On: 05/22/2023 17:19     Assessment and Plan: No notes have been filed under this hospital service. Service: Hospitalist  Principal Problem:   Rhabdomyolysis Active Problems:   COPD (chronic obstructive pulmonary disease) with acute bronchitis (HCC)   Chronic pain syndrome   Essential hypertension   Obstructive nephropathy   Closed fracture of multiple pubic rami, left, initial encounter (HCC)  Rhabdomyolysis IV fluids Check CK tonight and tomorrow morning Recheck BMP in the morning AKI  secondary to obstructive nephropathy and urinary retention This may be medication related, specifically the Gemtesa.  Will hold this.  Prior to going to skilled nursing facility, she will need a postvoid residual scan Closed fracture of pubic rami Pain control Will need a brace PT eval Transition of care team consulted for either home health or SNF placement Hypertension Continue antihypertensives Chronic pain syndrome   Advance Care Planning:   Code Status: Full Code confirmed by patient  Consults: None  Family Communication: Son present during exam  Severity of Illness: The appropriate patient status for this patient is INPATIENT. Inpatient status is judged to be reasonable and necessary in order to provide the required intensity of service to ensure the patient's safety. The patient's presenting symptoms, physical exam findings, and initial radiographic and laboratory data in the context of their chronic comorbidities is felt to place them at high risk for further clinical deterioration. Furthermore, it is not anticipated that the patient will be medically stable for discharge from the hospital within 2 midnights of admission.   * I certify that at the point of admission it is my  clinical judgment that the patient will require inpatient hospital care spanning beyond 2 midnights from the point of admission due to high intensity of service, high risk for further deterioration and high frequency of surveillance required.*  Author: Levie Heritage, DO 05/22/2023 6:50 PM  For on call review [email or web address].

## 2023-05-23 ENCOUNTER — Inpatient Hospital Stay (HOSPITAL_COMMUNITY): Payer: 59

## 2023-05-23 DIAGNOSIS — M6282 Rhabdomyolysis: Secondary | ICD-10-CM | POA: Diagnosis not present

## 2023-05-23 LAB — BASIC METABOLIC PANEL
Anion gap: 10 (ref 5–15)
BUN: 24 mg/dL — ABNORMAL HIGH (ref 8–23)
CO2: 26 mmol/L (ref 22–32)
Calcium: 8.4 mg/dL — ABNORMAL LOW (ref 8.9–10.3)
Chloride: 97 mmol/L — ABNORMAL LOW (ref 98–111)
Creatinine, Ser: 0.76 mg/dL (ref 0.44–1.00)
GFR, Estimated: >60 mL/min (ref >60)
Glucose, Bld: 118 mg/dL — ABNORMAL HIGH (ref 70–99)
Potassium: 4 mmol/L (ref 3.5–5.1)
Sodium: 133 mmol/L — ABNORMAL LOW (ref 135–145)

## 2023-05-23 LAB — HIV ANTIBODY (ROUTINE TESTING W REFLEX): HIV Screen 4th Generation wRfx: NONREACTIVE

## 2023-05-23 LAB — CBC
HCT: 35.3 % — ABNORMAL LOW (ref 36.0–46.0)
Hemoglobin: 11 g/dL — ABNORMAL LOW (ref 12.0–15.0)
MCH: 28.5 pg (ref 26.0–34.0)
MCHC: 31.2 g/dL (ref 30.0–36.0)
MCV: 91.5 fL (ref 80.0–100.0)
Platelets: 164 10*3/uL (ref 150–400)
RBC: 3.86 MIL/uL — ABNORMAL LOW (ref 3.87–5.11)
RDW: 13.1 % (ref 11.5–15.5)
WBC: 10.7 10*3/uL — ABNORMAL HIGH (ref 4.0–10.5)
nRBC: 0 % (ref 0.0–0.2)

## 2023-05-23 LAB — CK: Total CK: 843 U/L — ABNORMAL HIGH (ref 38–234)

## 2023-05-23 LAB — MAGNESIUM: Magnesium: 1.7 mg/dL (ref 1.7–2.4)

## 2023-05-23 MED ORDER — SODIUM CHLORIDE 0.9 % IV SOLN: INTRAVENOUS | Status: DC

## 2023-05-23 MED ORDER — HYDROMORPHONE HCL 1 MG/ML IJ SOLN
1.0000 mg | INTRAMUSCULAR | Status: DC | PRN
  Administered 2023-05-23 – 2023-05-24 (×5): 1 mg via INTRAVENOUS
  Filled 2023-05-23 (×6): qty 1

## 2023-05-23 MED ORDER — OXYCODONE-ACETAMINOPHEN 10-325 MG PO TABS: 1.0000 | ORAL_TABLET | Freq: Three times a day (TID) | ORAL | Status: DC

## 2023-05-23 MED ORDER — OXYCODONE HCL 5 MG PO TABS
5.0000 mg | ORAL_TABLET | Freq: Three times a day (TID) | ORAL | Status: DC
  Administered 2023-05-23 – 2023-05-25 (×8): 5 mg via ORAL
  Filled 2023-05-23 (×10): qty 1

## 2023-05-23 MED ORDER — OXYCODONE-ACETAMINOPHEN 5-325 MG PO TABS
1.0000 | ORAL_TABLET | Freq: Three times a day (TID) | ORAL | Status: DC
  Administered 2023-05-23 – 2023-05-25 (×8): 1 via ORAL
  Filled 2023-05-23 (×10): qty 1

## 2023-05-23 NOTE — Progress Notes (Signed)
Notified provider of confusion.  Orders received.

## 2023-05-23 NOTE — Progress Notes (Signed)
Patient transported to CT and returned to floor

## 2023-05-23 NOTE — Progress Notes (Signed)
PROGRESS NOTE    Kristen Ryan  ZOX:[id number] DOB: 1956-06-23 DOA: 05/22/2023 PCP: Leone Payor, FNP   Brief Narrative:    Kristen Ryan is a 67 y.o. female with medical history significant of COPD, bipolar, hypertension, hypothyroidism, IBS.  Patient seen after a fall on Wednesday.  Her son who lives with her heard the fall and saw her wedged by the dresser.  She was on the ground for just a couple of minutes.  She returned to bed and had a lot of pain in her pelvis and was unable to ambulate and has been in bed for the majority of the time since her fall.  She was admitted with AKI secondary to rhabdomyolysis related to fall and is also noted to have closed fracture of her pubic rami.  Assessment & Plan:   Principal Problem:   Rhabdomyolysis Active Problems:   COPD (chronic obstructive pulmonary disease) with acute bronchitis (HCC)   Chronic pain syndrome   Essential hypertension   Obstructive nephropathy   Closed fracture of multiple pubic rami, left, initial encounter (HCC)  Assessment and Plan:   Rhabdomyolysis-improving IV fluids with normal saline Check repeat CK in a.m. Recheck BMP in the morning AKI secondary to obstructive nephropathy and urinary retention-improving This may be medication related, specifically the Gemtesa.  Will hold this.  Prior to going to skilled nursing facility, she will need a postvoid residual scan Closed fracture of pubic rami Pain control Will need a brace PT eval pending Transition of care team consulted for either home health or SNF placement Hypertension Continue antihypertensives Chronic pain syndrome Resume home oxycodone and IV Dilaudid adjusted for breakthrough Hyponatremia Maintained on normal saline and monitor repeat labs    DVT prophylaxis:Lovenox Code Status: Full Family Communication: None at bedside Disposition Plan: Anticipate need for SNF Status is: Inpatient Remains inpatient appropriate because: Need for  IV fluid   Consultants:  None  Procedures:  None  Antimicrobials:  None   Subjective: Patient seen and evaluated today with complaints of low back pain this morning.  No acute overnight concerns or events noted.  Objective: Vitals:   05/22/23 1943 05/22/23 2300 05/23/23 0348 05/23/23 0753  BP: 109/62 94/70 120/79 (!) 163/82  Pulse: 94 94 90 98  Resp: 18 16 14 16   Temp: 99 F (37.2 C) 98.8 F (37.1 C) 98.6 F (37 C) 98.1 F (36.7 C)  TempSrc: Oral Oral Oral Oral  SpO2: 100% 97% 98% 96%  Weight: 51.8 kg     Height:        Intake/Output Summary (Last 24 hours) at 05/23/2023 1036 Last data filed at 05/23/2023 0834 Gross per 24 hour  Intake 761.54 ml  Output 3518 ml  Net -2756.46 ml   Filed Weights   05/22/23 1445 05/22/23 1943  Weight: 60.2 kg 51.8 kg    Examination:  General exam: Appears calm and comfortable  Respiratory system: Clear to auscultation. Respiratory effort normal. Cardiovascular system: S1 & S2 heard, RRR.  Gastrointestinal system: Abdomen is soft Central nervous system: Alert and awake Extremities: No edema Skin: No significant lesions noted Psychiatry: Flat affect.    Data Reviewed: I have personally reviewed following labs and imaging studies  CBC: Recent Labs  Lab 05/22/23 1558 05/23/23 0737  WBC 12.2* 10.7*  NEUTROABS 9.9*  --   HGB 11.9* 11.0*  HCT 38.3 35.3*  MCV 91.2 91.5  PLT 183 164   Basic Metabolic Panel: Recent Labs  Lab 05/22/23 1558 05/23/23 0737  NA  134* 133*  K 3.6 4.0  CL 93* 97*  CO2 29 26  GLUCOSE 120* 118*  BUN 50* 24*  CREATININE 1.37* 0.76  CALCIUM 8.7* 8.4*  MG  --  1.7   GFR: Estimated Creatinine Clearance: 55.8 mL/min (by C-G formula based on SCr of 0.76 mg/dL). Liver Function Tests: No results for input(s): "AST", "ALT", "ALKPHOS", "BILITOT", "PROT", "ALBUMIN" in the last 168 hours. No results for input(s): "LIPASE", "AMYLASE" in the last 168 hours. No results for input(s): "AMMONIA" in  the last 168 hours. Coagulation Profile: No results for input(s): "INR", "PROTIME" in the last 168 hours. Cardiac Enzymes: Recent Labs  Lab 05/22/23 1629 05/22/23 2105 05/23/23 0438  CKTOTAL 1,597* 1,104* 843*   BNP (last 3 results) No results for input(s): "PROBNP" in the last 8760 hours. HbA1C: No results for input(s): "HGBA1C" in the last 72 hours. CBG: No results for input(s): "GLUCAP" in the last 168 hours. Lipid Profile: No results for input(s): "CHOL", "HDL", "LDLCALC", "TRIG", "CHOLHDL", "LDLDIRECT" in the last 72 hours. Thyroid Function Tests: No results for input(s): "TSH", "T4TOTAL", "FREET4", "T3FREE", "THYROIDAB" in the last 72 hours. Anemia Panel: No results for input(s): "VITAMINB12", "FOLATE", "FERRITIN", "TIBC", "IRON", "RETICCTPCT" in the last 72 hours. Sepsis Labs: No results for input(s): "PROCALCITON", "LATICACIDVEN" in the last 168 hours.  No results found for this or any previous visit (from the past 240 hour(s)).       Radiology Studies: CT HEAD WO CONTRAST ( )  Result Date: 05/23/2023 CLINICAL DATA:  Initial evaluation for acute trauma, recent fall. Increased confusion. EXAM: CT HEAD WITHOUT CONTRAST TECHNIQUE: Contiguous axial images were obtained from the base of the skull through the vertex without intravenous contrast. RADIATION DOSE REDUCTION: This exam was performed according to the departmental dose-optimization program which includes automated exposure control, adjustment of the mA and/or kV according to patient size and/or use of iterative reconstruction technique. COMPARISON:  Prior Study from 04/25/2018. FINDINGS: Brain: Cerebral volume within normal limits. Mild chronic microvascular ischemic disease. No acute intracranial hemorrhage. No acute large vessel territory infarct. No mass lesion, midline shift or mass effect. No hydrocephalus or extra-axial fluid collection. Vascular: No abnormal hyperdense vessel. Skull: Scalp soft tissues  demonstrate no acute finding. Calvarium intact. Sinuses/Orbits: Globes and orbital soft tissues within normal limits. Mild mucosal thickening noted about the left maxillary sinus. Visualized paranasal sinuses are otherwise clear. No mastoid effusion. Other: None. IMPRESSION: 1. No acute intracranial abnormality. 2. Mild chronic microvascular ischemic disease. Electronically Signed   By: Rise Mu M.D.   On: 05/23/2023 02:06   DG HIP UNILAT WITH PELVIS 2-3 VIEWS LEFT  Result Date: 05/22/2023 CLINICAL DATA:  Pelvic pain after fall EXAM: DG HIP (WITH OR WITHOUT PELVIS) 2-3V LEFT COMPARISON:  09/10/2020 FINDINGS: Acute fracture of the left superior pubic ramus at the pubic root without significant displacement. A corresponding inferior pubic ramus fracture, although expected, is not definitively seen. Left hip intact without fracture or dislocation. No pelvic diastasis. IMPRESSION: Acute fracture of the left superior pubic ramus at the pubic root without significant displacement. Electronically Signed   By: Duanne Guess D.O.   On: 05/22/2023 17:19   DG Lumbar Spine Complete  Result Date: 05/22/2023 CLINICAL DATA:  Back pain post fall. EXAM: LUMBAR SPINE - COMPLETE 4+ VIEW COMPARISON:  None Available. FINDINGS: There is no evidence of lumbar spine fracture. Alignment is normal. Intervertebral disc spaces are maintained. IMPRESSION: Negative. Electronically Signed   By: Ted Mcalpine M.D.   On: 05/22/2023  17:19        Scheduled Meds:  docusate sodium  100 mg Oral BID   enoxaparin (LOVENOX) injection  40 mg Subcutaneous Q24H   gabapentin  600 mg Oral BID   levothyroxine  50 mcg Oral Q0600   methocarbamol  750 mg Oral BID   montelukast  10 mg Oral QHS   oxyCODONE-acetaminophen  1 tablet Oral Q8H   And   oxyCODONE  5 mg Oral Q8H   pantoprazole  40 mg Oral Daily   ziprasidone  80 mg Oral BID WC   Continuous Infusions:  sodium chloride       LOS: 1 day    Time spent: 55  minutes    Dynasia Kercheval Hoover Brunette, DO [facility]  If 7PM-7AM, please contact night-coverage [email or web address] 05/23/2023, 10:36 AM

## 2023-05-24 DIAGNOSIS — M6282 Rhabdomyolysis: Secondary | ICD-10-CM | POA: Diagnosis not present

## 2023-05-24 LAB — CBC
HCT: 29.2 % — ABNORMAL LOW (ref 36.0–46.0)
Hemoglobin: 9.2 g/dL — ABNORMAL LOW (ref 12.0–15.0)
MCH: 29.1 pg (ref 26.0–34.0)
MCHC: 31.5 g/dL (ref 30.0–36.0)
MCV: 92.4 fL (ref 80.0–100.0)
Platelets: 156 10*3/uL (ref 150–400)
RBC: 3.16 MIL/uL — ABNORMAL LOW (ref 3.87–5.11)
RDW: 13.1 % (ref 11.5–15.5)
WBC: 7.5 10*3/uL (ref 4.0–10.5)
nRBC: 0 % (ref 0.0–0.2)

## 2023-05-24 LAB — COMPREHENSIVE METABOLIC PANEL
ALT: 16 U/L (ref 0–44)
AST: 20 U/L (ref 15–41)
Albumin: 2.3 g/dL — ABNORMAL LOW (ref 3.5–5.0)
Alkaline Phosphatase: 46 U/L (ref 38–126)
Anion gap: 8 (ref 5–15)
BUN: 14 mg/dL (ref 8–23)
CO2: 27 mmol/L (ref 22–32)
Calcium: 8.2 mg/dL — ABNORMAL LOW (ref 8.9–10.3)
Chloride: 102 mmol/L (ref 98–111)
Creatinine, Ser: 0.66 mg/dL (ref 0.44–1.00)
GFR, Estimated: >60 mL/min (ref >60)
Glucose, Bld: 107 mg/dL — ABNORMAL HIGH (ref 70–99)
Potassium: 4 mmol/L (ref 3.5–5.1)
Sodium: 137 mmol/L (ref 135–145)
Total Bilirubin: 0.5 mg/dL (ref <1.2)
Total Protein: 5.1 g/dL — ABNORMAL LOW (ref 6.5–8.1)

## 2023-05-24 LAB — CK: Total CK: 289 U/L — ABNORMAL HIGH (ref 38–234)

## 2023-05-24 LAB — MAGNESIUM: Magnesium: 1.6 mg/dL — ABNORMAL LOW (ref 1.7–2.4)

## 2023-05-24 MED ORDER — CHLORHEXIDINE GLUCONATE CLOTH 2 % EX PADS
6.0000 | MEDICATED_PAD | Freq: Every day | CUTANEOUS | Status: DC
  Administered 2023-05-24 – 2023-05-25 (×2): 6 via TOPICAL

## 2023-05-24 MED ORDER — MAGNESIUM SULFATE 2 GM/50ML IV SOLN
2.0000 g | Freq: Once | INTRAVENOUS | Status: AC
  Administered 2023-05-24: 2 g via INTRAVENOUS
  Filled 2023-05-24: qty 50

## 2023-05-24 NOTE — Evaluation (Signed)
Physical Therapy Evaluation Patient Details Name: Kristen Ryan MRN: [id number] DOB: [birth year] Today's Date: 05/24/2023  History of Present Illness  Kristen Ryan is a 67 y.o. female with medical history significant of COPD, bipolar, hypertension, hypothyroidism, IBS.  Patient seen after a fall on Wednesday.  Her son who lives with her heard the fall and saw her wedged by the dresser.  She was on the ground for just a couple of minutes.  She returned to bed and had a lot of pain in her pelvis and was unable to ambulate and has been in bed for the majority of the time since her fall.  She has had decreased appetite.  Has a lot of pelvic pressure and pain, which is worse with walking and ambulation.  She has not been peeing as frequently.   Clinical Impression  Patient demonstrates slow labored movement for sitting up at bedside and unable to transfer from left side of bed due to increasing hip pain, limited to a few slow labored side steps before having to sit due hip pain and c/o weakness.  Patient tolerated sitting up in chair after therapy.  Patient will benefit from continued skilled physical therapy in hospital and recommended venue below to increase strength, balance, endurance for safe ADLs and gait.           If plan is discharge home, recommend the following: A lot of help with bathing/dressing/bathroom;A lot of help with walking and/or transfers;Help with stairs or ramp for entrance;Assistance with cooking/housework   Can travel by private vehicle   No    Equipment Recommendations None recommended by PT  Recommendations for Other Services       Functional Status Assessment Patient has had a recent decline in their functional status and demonstrates the ability to make significant improvements in function in a reasonable and predictable amount of time.     Precautions / Restrictions Precautions Precautions: Fall Restrictions Weight Bearing Restrictions: No       Mobility  Bed Mobility Overal bed mobility: Needs Assistance Bed Mobility: Supine to Sit     Supine to sit: Contact guard, Min assist     General bed mobility comments: increased time, labored movement transferred from right side of bed due to increasing left hip pain    Transfers Overall transfer level: Needs assistance Equipment used: Rolling walker (2 wheels) Transfers: Sit to/from Stand, Bed to chair/wheelchair/BSC Sit to Stand: Mod assist   Step pivot transfers: Mod assist, Max assist       General transfer comment: Difficulty bearing weight through L LE. Extended time and assist needed.    Ambulation/Gait Ambulation/Gait assistance: Mod assist, Max assist Gait Distance (Feet): 3 Feet Assistive device: Rolling walker (2 wheels) Gait Pattern/deviations: Decreased step length - right, Decreased step length - left, Decreased stride length, Antalgic, Knees buckling Gait velocity: slow     General Gait Details: limited to a few slow labored side steps before having to sit due to increasing left hip pain  Stairs            Wheelchair Mobility     Tilt Bed    Modified Rankin (Stroke Patients Only)       Balance Overall balance assessment: Needs assistance Sitting-balance support: Feet supported, No upper extremity supported Sitting balance-Leahy Scale: Fair Sitting balance - Comments: fair/good seated at EOB   Standing balance support: Reliant on assistive device for balance, During functional activity, Bilateral upper extremity supported Standing balance-Leahy Scale: Poor Standing balance comment:  using RW                             Pertinent Vitals/Pain Pain Assessment Pain Assessment: 0-10 Pain Score: 9  Pain Location: groin area Pain Descriptors / Indicators: Grimacing, Guarding, Sharp, Sore Pain Intervention(s): Limited activity within patient's tolerance, Monitored during session, Premedicated before session, Repositioned     Home Living Family/patient expects to be discharged to:: Private residence Living Arrangements: Children Available Help at Discharge: Family;Available PRN/intermittently Type of Home: House Home Access: Stairs to enter Entrance Stairs-Rails: Can reach both Entrance Stairs-Number of Steps: 2-3   Home Layout: One level Home Equipment: Agricultural consultant (2 wheels);Cane - single point;Wheelchair - manual;Shower seat - built in;Grab bars - tub/shower;Grab bars - toilet      Prior Function Prior Level of Function : Needs assist       Physical Assist : ADLs (physical);Mobility (physical) Mobility (physical): Bed mobility;Transfers;Gait;Stairs   Mobility Comments: [place] ambulator with cane and RW. ADLs Comments: Assist for bathing, dressing, and IADL's. Pt repots being mostly independent for toileting.     Extremity/Trunk Assessment   Upper Extremity Assessment Upper Extremity Assessment: Defer to OT evaluation    Lower Extremity Assessment Lower Extremity Assessment: Generalized weakness;LLE deficits/detail LLE Deficits / Details: grossly 3/5 LLE: Unable to fully assess due to pain LLE Sensation: WNL LLE Coordination: WNL    Cervical / Trunk Assessment Cervical / Trunk Assessment: Kyphotic  Communication   Communication Communication: No apparent difficulties Cueing Techniques: Verbal cues;Tactile cues  Cognition Arousal: Lethargic Behavior During Therapy: WFL for tasks assessed/performed Overall Cognitive Status: No family/caregiver present to determine baseline cognitive functioning                                 General Comments: Pt oriented to date, but was not fully aware of place. Extended time and repeating of questions needed.        General Comments      Exercises     Assessment/Plan    PT Assessment Patient needs continued PT services  PT Problem List Decreased strength;Decreased activity tolerance;Decreased balance;Decreased  mobility       PT Treatment Interventions DME instruction;Gait training;Stair training;Functional mobility training;Therapeutic activities;Therapeutic exercise;Balance training;Patient/family education    PT Goals (Current goals can be found in the Care Plan section)  Acute Rehab PT Goals Patient Stated Goal: return home after rehab PT Goal Formulation: With patient Time For Goal Achievement: 06/07/23 Potential to Achieve Goals: Good    Frequency Min 3X/week     Co-evaluation PT/OT/SLP Co-Evaluation/Treatment: Yes Reason for Co-Treatment: To address functional/ADL transfers PT goals addressed during session: Mobility/safety with mobility;Balance;Proper use of DME         AM-PAC PT "6 Clicks" Mobility  Outcome Measure Help needed turning from your back to your side while in a flat bed without using bedrails?: A Lot Help needed moving from lying on your back to sitting on the side of a flat bed without using bedrails?: A Lot Help needed moving to and from a bed to a chair (including a wheelchair)?: A Lot Help needed standing up from a chair using your arms (e.g., wheelchair or bedside chair)?: A Lot Help needed to walk in hospital room?: A Lot Help needed climbing 3-5 steps with a railing? : Total 6 Click Score: 11    End of Session   Activity Tolerance: Patient tolerated  treatment well;Patient limited by fatigue Patient left: in chair;with call bell/phone within reach;with chair alarm set Nurse Communication: Mobility status PT Visit Diagnosis: Unsteadiness on feet (R26.81);Other abnormalities of gait and mobility (R26.89);Muscle weakness (generalized) (M62.81)    Time: 2956-2130 PT Time Calculation (min) (ACUTE ONLY): 21 min   Charges:   PT Evaluation $PT Eval Moderate Complexity: 1 Mod PT Treatments $Therapeutic Activity: 8-22 mins PT General Charges $$ ACUTE PT VISIT: 1 Visit         2:01 PM, 05/24/23 Ocie Bob, MPT Physical Therapist with  [facility] 336 [phone] office [phone] mobile phone

## 2023-05-24 NOTE — Evaluation (Addendum)
Speech Language Pathology Evaluation Patient Details Name: Kristen Ryan MRN: [id number] DOB: 1955-11-14 Today's Date: 05/24/2023 Time: 4098-1191 SLP Time Calculation (min) (ACUTE ONLY): 33 min  Problem List:  Patient Active Problem List   Diagnosis Date Noted   Rhabdomyolysis 05/22/2023   Obstructive nephropathy 05/22/2023   Closed fracture of multiple pubic rami, left, initial encounter (HCC) 05/22/2023   Benzodiazepine misuse 07/13/2022   Polypharmacy 07/13/2022   Long term current use of antipsychotic medication 07/13/2022   Schizoaffective disorder (HCC) 06/08/2022   Generalized anxiety disorder with panic attacks 06/08/2022   Anorexia nervosa 01/27/2022   Migraine 02/19/2021   Essential hypertension 01/28/2021   Chronic pain syndrome 09/03/2020   SI joint arthritis (HCC) 09/03/2020   Chronic pain of left knee 09/03/2020   Hip pain 09/03/2020   Lumbar facet arthropathy 06/21/2019   Lumbar degenerative disc disease 06/21/2019   Cervical facet joint syndrome 06/21/2019   Sacroiliac joint pain 06/21/2019   PTSD (post-traumatic stress disorder) 12/11/2016   Well woman exam with routine gynecological exam 06/15/2016   COPD (chronic obstructive pulmonary disease) with acute bronchitis (HCC) 08/23/2015   Hypokalemia 08/03/2015   Normocytic anemia 08/03/2015   COPD with asthma (HCC)    Thyroid activity decreased    Midline thoracic back pain 08/21/2014   Abnormality of gait 08/21/2014   Urinary urgency 08/21/2014   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   Past Medical History:  Past Medical History:  Diagnosis Date   Acute acalculous cholecystitis 07/06/2021   Acute respiratory failure with hypoxia (HCC) 08/23/2015   Anemia    Asthma    Bipolar 1 disorder (HCC)    Bipolar 1 disorder (HCC) 02/14/2015   Bipolar 1 disorder, depressed, moderate (HCC) 02/15/2020   Bipolar 1 disorder, mixed, moderate (HCC) 08/27/2016   Bipolar disorder in partial remission (HCC)    Bronchitis     COLLES' FRACTURE, LEFT WRIST 03/27/2008   Qualifier: Diagnosis of   By: Romeo Apple MD, Duffy Rhody       COPD (chronic obstructive pulmonary disease) (HCC)    Diverticulitis    Hypertension    IBS (irritable bowel syndrome)    Migraine    Neuropathy    Schizophrenia (HCC)    Sigmoid diverticulitis 08/03/2015   Thyroid disease    Past Surgical History:  Past Surgical History:  Procedure Laterality Date   ABDOMINAL HYSTERECTOMY     broke left arm     CESAREAN SECTION     CHOLECYSTECTOMY N/A 07/08/2021   Procedure: LAPAROSCOPIC CHOLECYSTECTOMY;  Surgeon: Lewie Chamber, DO;  Location: [facility] ORS;  Service: General;  Laterality: N/A;   COLONOSCOPY N/A 08/30/2014   Procedure: COLONOSCOPY;  Surgeon: Malissa Hippo, MD;  Location: [facility] ENDO SUITE;  Service: Endoscopy;  Laterality: N/A;  1200   ELBOW SURGERY     FOOT SURGERY     KNEE SURGERY     TONSILLECTOMY     HPI:  Kristen Ryan is a 67 y.o. female with medical history significant of COPD, bipolar, hypertension, hypothyroidism, IBS.  Patient seen after a fall on Wednesday.  Her son who lives with her heard the fall and saw her wedged by the dresser.  She was on the ground for just a couple of minutes.  She returned to bed and had a lot of pain in her pelvis and was unable to ambulate and has been in bed for the majority of the time since her fall.  She was admitted with AKI secondary to rhabdomyolysis related to fall and  is also noted to have closed fracture of her pubic rami. SLE requested.   Assessment / Plan / Recommendation Clinical Impression  Cognitive linguistic evaluation completed at bedside. Pt resting with eyes closed and required direct request for her to open her eyes during evaluation (despite talking with SLP). SLP attempted to administer the [facility], however she was unable to sustain attention to answer the questions (she was alert, but just repeated incorrect information over). She presents with confusion and exhibits  sound substitutions, but recognizes the errors. She is oriented to self, month, and year, off day by one. She states that her son lives with and she requires his assistance due to her memory and "cognegative" changes, but also says that she drives and handles her medications (often skipping doses and doubling up). 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 There is no family present to provided information on her cognitive functioning. Recommend f/u SLP services at the next venue of care (SNF vs [facility] to address cognitive communication changes. Suspect medications are impacting Pt's cognition. She would likely benefit from repeat cognitive linguistic evaluation when she is closer to her baseline- need to ask family. She was supposed to follow with a neurologist per psychiatry, but this has not occurred. SLP will sign off in acute setting and she can receive SLP services post discharge.    SLP Assessment  SLP Recommendation/Assessment: All further Speech Lanaguage Pathology  needs can be addressed in the next venue of care SLP Visit Diagnosis: Cognitive communication deficit (R41.841)    Recommendations for follow up therapy are one component of a multi-disciplinary discharge planning process, led by the attending physician.  Recommendations may be updated based on patient status, additional functional criteria and insurance authorization.    Follow Up Recommendations  Follow physician's recommendations for discharge plan and follow up therapies    Assistance Recommended at Discharge  Set up Supervision/Assistance  Functional Status Assessment Patient has had a recent decline in their functional status and demonstrates the ability to make significant improvements in function in a reasonable and predictable amount of time.  Frequency and Duration           SLP Evaluation Cognition  Overall Cognitive Status: No family/caregiver present to determine baseline  cognitive functioning Arousal/Alertness: Awake/alert Orientation Level: Oriented to person;Oriented to place;Disoriented to time Year: 2024 Month: November Day of Week: Incorrect Attention: Sustained Sustained Attention: Impaired Sustained Attention Impairment: Verbal basic;Functional basic Memory: Impaired Memory Impairment: Storage deficit Awareness: Impaired Awareness Impairment: Emergent impairment Problem Solving: Impaired Problem Solving Impairment: Verbal basic;Functional basic Executive Function: Organizing;Decision Making;Initiating;Self Monitoring Organizing: Impaired Organizing Impairment: Verbal basic;Functional basic Decision Making: Impaired Decision Making Impairment: Verbal basic Initiating: Impaired Initiating Impairment: Verbal complex Self Monitoring: Impaired Self Monitoring Impairment: Verbal complex Safety/Judgment: Impaired       Comprehension  Auditory Comprehension Overall Auditory Comprehension: Impaired Yes/No Questions: Impaired Complex Questions: 0-24% accurate Commands: Impaired Two Step Basic Commands: 50-74% accurate Conversation: Simple Interfering Components: Attention;Pain;Working memory EffectiveTechniques: Repetition;Visual/Gestural [employer];Extra processing time Visual Recognition/Discrimination Discrimination: Within Function Limits Reading Comprehension Reading Status: Not tested    Expression Expression Primary Mode of Expression: Verbal Verbal Expression Overall Verbal Expression: Impaired Initiation: No impairment Automatic Speech: Name;Social Response Level of Generative/Spontaneous Verbalization: Sentence Repetition: Impaired Level of Impairment: Sentence level Naming: Impairment Responsive: 51-75% accurate Confrontation: Within functional limits Convergent: Not tested Divergent: 50-74% accurate Verbal Errors: Language of confusion Pragmatics: No impairment Interfering Components: Attention  Non-Verbal  Means of Communication: Not applicable Written Expression Dominant Hand: Right Written Expression: Not tested   Oral / Motor  Oral Motor/Sensory Function Overall Oral Motor/Sensory Function: Within functional limits Motor Speech Overall Motor Speech: Appears within functional limits for tasks assessed Respiration: Within functional limits Phonation: Normal Resonance: Within functional limits Articulation: Within functional limitis Intelligibility: Intelligible Motor Planning: Witnin functional limits Motor Speech Errors: Not applicable           Thank you,  Havery Moros, CCC-SLP [phone]  Channa Hazelett 05/24/2023, 3:38 PM

## 2023-05-24 NOTE — TOC Initial Note (Signed)
Transition of Care [facility]) - Initial/Assessment Note    Patient Details  Name: Kristen Ryan MRN: [id number] Date of Birth: 10-Mar-1956  Transition of Care [facility]) CM/SW Contact:    Karn Cassis, LCSW Phone Number: 05/24/2023, 11:06 AM  Clinical Narrative:  Pt admitted due to rhabdomyolysis. Assessment completed with pt's husband as pt oriented to self only per chart. Pt's husband reports pt lives with her son. She requires assist with ADLs. PT evaluated pt and recommend SNF. LCSW discussed SNF placement and Medicare.gov ratings. Pt's husband reports pt needs to be in [place] for family. Will send referral and start SNF auth.                  Expected Discharge Plan: Skilled Nursing Facility Barriers to Discharge: Continued Medical Work up   Patient Goals and CMS Choice Patient states their goals for this hospitalization and ongoing recovery are:: short term SNF   Choice offered to / list presented to : Spouse [place] ownership interest in [facility].provided to:: Spouse    Expected Discharge Plan and Services In-house Referral: Clinical Social Work   Post Acute Care Choice: Skilled Nursing Facility Living arrangements for the past 2 months: Single Family Home                                      Prior Living Arrangements/Services Living arrangements for the past 2 months: Single Family Home Lives with:: Adult Children Patient language and need for interpreter reviewed:: Yes Do you feel safe going back to the place where you live?: Yes      Need for Family Participation in Patient Care: Yes (Comment)     Criminal Activity/Legal Involvement Pertinent to Current Situation/Hospitalization: No - Comment as needed  Activities of Daily Living   ADL Screening (condition at time of admission) Independently performs ADLs?: No Does the patient have a NEW difficulty with bathing/dressing/toileting/self-feeding that is expected to last >3 days?:  Yes (Initiates electronic notice to provider for possible OT consult) Does the patient have a NEW difficulty with getting in/out of bed, walking, or climbing stairs that is expected to last >3 days?: Yes (Initiates electronic notice to provider for possible PT consult) Does the patient have a NEW difficulty with communication that is expected to last >3 days?: Yes (Initiates electronic notice to provider for possible SLP consult) Is the patient deaf or have difficulty hearing?: No Does the patient have difficulty seeing, even when wearing glasses/contacts?: No Does the patient have difficulty concentrating, remembering, or making decisions?: Yes  Permission Sought/Granted                  Emotional Assessment   Attitude/Demeanor/Rapport: Unable to Assess Affect (typically observed): Unable to Assess Orientation: : Oriented to Self Alcohol / Substance Use: Not Applicable Psych Involvement: No (comment)  Admission diagnosis:  Rhabdomyolysis [M62.82] Urinary retention [R33.9] AKI (acute kidney injury) (HCC) [N17.9] Traumatic rhabdomyolysis, initial encounter (HCC) [T79.6XXA] Closed fracture of superior ramus of left pubis, initial encounter [facility]) [S32.512A] Patient Active Problem List   Diagnosis Date Noted   Rhabdomyolysis 05/22/2023   Obstructive nephropathy 05/22/2023   Closed fracture of multiple pubic rami, left, initial encounter (HCC) 05/22/2023   Benzodiazepine misuse 07/13/2022   Polypharmacy 07/13/2022   Long term current use of antipsychotic medication 07/13/2022   Schizoaffective disorder (HCC) 06/08/2022   Generalized anxiety disorder with panic attacks 06/08/2022  Anorexia nervosa 01/27/2022   Migraine 02/19/2021   Essential hypertension 01/28/2021   Chronic pain syndrome 09/03/2020   SI joint arthritis (HCC) 09/03/2020   Chronic pain of left knee 09/03/2020   Hip pain 09/03/2020   Lumbar facet arthropathy 06/21/2019   Lumbar degenerative disc disease  06/21/2019   Cervical facet joint syndrome 06/21/2019   Sacroiliac joint pain 06/21/2019   PTSD (post-traumatic stress disorder) 12/11/2016   Well woman exam with routine gynecological exam 06/15/2016   COPD (chronic obstructive pulmonary disease) with acute bronchitis (HCC) 08/23/2015   Hypokalemia 08/03/2015   Normocytic anemia 08/03/2015   COPD with asthma (HCC)    Thyroid activity decreased    Midline thoracic back pain 08/21/2014   Abnormality of gait 08/21/2014   Urinary urgency 08/21/2014   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   PCP:  Leone Payor, FNP Pharmacy:   [facility] - [place], [place] - [address] [place] [address] Phone: [phone] Fax: [phone]     Social Determinants of Health (SDOH) Social History: SDOH Screenings   Food Insecurity: No Food Insecurity (05/22/2023)  Housing: Low Risk  (05/22/2023)  Transportation Needs: No Transportation Needs (05/22/2023)  Utilities: Not At Risk (05/22/2023)  Depression (PHQ2-9): High Risk (10/20/2022)  Tobacco Use: Medium Risk (05/22/2023)   SDOH Interventions:     Readmission Risk Interventions    05/24/2023   11:04 AM  Readmission Risk Prevention Plan  Transportation Screening Complete  HRI or Home Care Consult Complete  Social Work Consult for Recovery Care Planning/Counseling Complete  Palliative Care Screening Not Applicable  Medication Review Oceanographer) Complete

## 2023-05-24 NOTE — Progress Notes (Signed)
Patients oxygen saturations dropped to 86% on room air, placed patient on 2 liters of oxygen. Patient verbalized no complaints, was resting in bed. MD Thomes Dinning made aware.

## 2023-05-24 NOTE — Progress Notes (Signed)
PROGRESS NOTE    Kristen MANALILI  Ryan:[id number] DOB: Dec 10, 1955 DOA: 05/22/2023 PCP: Leone Payor, FNP   Brief Narrative:    Kristen Ryan is a 67 y.o. female with medical history significant of COPD, bipolar, hypertension, hypothyroidism, IBS.  Patient seen after a fall on Wednesday.  Her son who lives with her heard the fall and saw her wedged by the dresser.  She was on the ground for just a couple of minutes.  She returned to bed and had a lot of pain in her pelvis and was unable to ambulate and has been in bed for the majority of the time since her fall.  She was admitted with AKI secondary to rhabdomyolysis related to fall and is also noted to have closed fracture of her pubic rami.  PT recommending SNF placement.  Assessment & Plan:   Principal Problem:   Rhabdomyolysis Active Problems:   COPD (chronic obstructive pulmonary disease) with acute bronchitis (HCC)   Chronic pain syndrome   Essential hypertension   Obstructive nephropathy   Closed fracture of multiple pubic rami, left, initial encounter (HCC)  Assessment and Plan:   Rhabdomyolysis-improving Discontinue further IV fluid and monitor CK and BMP in a.m. AKI secondary to obstructive nephropathy and urinary retention-improving This may be medication related, specifically the Gemtesa.  Will hold this.  Prior to going to skilled nursing facility, she will need a postvoid residual scan Closed fracture of pubic rami Pain control PT recommending SNF Hypertension Continue antihypertensives Chronic pain syndrome Resume home oxycodone and IV Dilaudid adjusted for breakthrough    DVT prophylaxis:Lovenox Code Status: Full Family Communication: None at bedside Disposition Plan: Pending SNF placement Status is: Inpatient Remains inpatient appropriate because: Pending SNF placement   Consultants:  None  Procedures:  None  Antimicrobials:  None   Subjective: Patient seen and evaluated today with  complaints of low back pain this morning.  No acute overnight concerns or events noted.  Objective: Vitals:   05/23/23 0753 05/23/23 1212 05/23/23 2027 05/24/23 0418  BP: (!) 163/82 132/75 131/63 114/65  Pulse: 98 70 84 89  Resp: 16 14 18 18   Temp: 98.1 F (36.7 C)  98.2 F (36.8 C) 98.7 F (37.1 C)  TempSrc: Oral  Oral Oral  SpO2: 96% 100% 100% 93%  Weight:      Height:        Intake/Output Summary (Last 24 hours) at 05/24/2023 1053 Last data filed at 05/24/2023 0500 Gross per 24 hour  Intake 204.2 ml  Output 1400 ml  Net -1195.8 ml   Filed Weights   05/22/23 1445 05/22/23 1943  Weight: 60.2 kg 51.8 kg    Examination:  General exam: Appears calm and comfortable  Respiratory system: Clear to auscultation. Respiratory effort normal. Cardiovascular system: S1 & S2 heard, RRR.  Gastrointestinal system: Abdomen is soft Central nervous system: Alert and awake Extremities: No edema Skin: No significant lesions noted Psychiatry: Flat affect.    Data Reviewed: I have personally reviewed following labs and imaging studies  CBC: Recent Labs  Lab 05/22/23 1558 05/23/23 0737 05/24/23 0433  WBC 12.2* 10.7* 7.5  NEUTROABS 9.9*  --   --   HGB 11.9* 11.0* 9.2*  HCT 38.3 35.3* 29.2*  MCV 91.2 91.5 92.4  PLT 183 164 156   Basic Metabolic Panel: Recent Labs  Lab 05/22/23 1558 05/23/23 0737 05/24/23 0433  NA 134* 133* 137  K 3.6 4.0 4.0  CL 93* 97* 102  CO2 29 26 27  GLUCOSE 120* 118* 107*  BUN 50* 24* 14  CREATININE 1.37* 0.76 0.66  CALCIUM 8.7* 8.4* 8.2*  MG  --  1.7 1.6*   GFR: Estimated Creatinine Clearance: 55.8 mL/min (by C-G formula based on SCr of 0.66 mg/dL). Liver Function Tests: Recent Labs  Lab 05/24/23 0433  AST 20  ALT 16  ALKPHOS 46  BILITOT 0.5  PROT 5.1*  ALBUMIN 2.3*   No results for input(s): "LIPASE", "AMYLASE" in the last 168 hours. No results for input(s): "AMMONIA" in the last 168 hours. Coagulation Profile: No results for  input(s): "INR", "PROTIME" in the last 168 hours. Cardiac Enzymes: Recent Labs  Lab 05/22/23 1629 05/22/23 2105 05/23/23 0438 05/24/23 0433  CKTOTAL 1,597* 1,104* 843* 289*   BNP (last 3 results) No results for input(s): "PROBNP" in the last 8760 hours. HbA1C: No results for input(s): "HGBA1C" in the last 72 hours. CBG: No results for input(s): "GLUCAP" in the last 168 hours. Lipid Profile: No results for input(s): "CHOL", "HDL", "LDLCALC", "TRIG", "CHOLHDL", "LDLDIRECT" in the last 72 hours. Thyroid Function Tests: No results for input(s): "TSH", "T4TOTAL", "FREET4", "T3FREE", "THYROIDAB" in the last 72 hours. Anemia Panel: No results for input(s): "VITAMINB12", "FOLATE", "FERRITIN", "TIBC", "IRON", "RETICCTPCT" in the last 72 hours. Sepsis Labs: No results for input(s): "PROCALCITON", "LATICACIDVEN" in the last 168 hours.  No results found for this or any previous visit (from the past 240 hour(s)).       Radiology Studies: CT HEAD WO CONTRAST ( )  Result Date: 05/23/2023 CLINICAL DATA:  Initial evaluation for acute trauma, recent fall. Increased confusion. EXAM: CT HEAD WITHOUT CONTRAST TECHNIQUE: Contiguous axial images were obtained from the base of the skull through the vertex without intravenous contrast. RADIATION DOSE REDUCTION: This exam was performed according to the departmental dose-optimization program which includes automated exposure control, adjustment of the mA and/or kV according to patient size and/or use of iterative reconstruction technique. COMPARISON:  Prior Study from 04/25/2018. FINDINGS: Brain: Cerebral volume within normal limits. Mild chronic microvascular ischemic disease. No acute intracranial hemorrhage. No acute large vessel territory infarct. No mass lesion, midline shift or mass effect. No hydrocephalus or extra-axial fluid collection. Vascular: No abnormal hyperdense vessel. Skull: Scalp soft tissues demonstrate no acute finding. Calvarium  intact. Sinuses/Orbits: Globes and orbital soft tissues within normal limits. Mild mucosal thickening noted about the left maxillary sinus. Visualized paranasal sinuses are otherwise clear. No mastoid effusion. Other: None. IMPRESSION: 1. No acute intracranial abnormality. 2. Mild chronic microvascular ischemic disease. Electronically Signed   By: Rise Mu M.D.   On: 05/23/2023 02:06   DG HIP UNILAT WITH PELVIS 2-3 VIEWS LEFT  Result Date: 05/22/2023 CLINICAL DATA:  Pelvic pain after fall EXAM: DG HIP (WITH OR WITHOUT PELVIS) 2-3V LEFT COMPARISON:  09/10/2020 FINDINGS: Acute fracture of the left superior pubic ramus at the pubic root without significant displacement. A corresponding inferior pubic ramus fracture, although expected, is not definitively seen. Left hip intact without fracture or dislocation. No pelvic diastasis. IMPRESSION: Acute fracture of the left superior pubic ramus at the pubic root without significant displacement. Electronically Signed   By: Duanne Guess D.O.   On: 05/22/2023 17:19   DG Lumbar Spine Complete  Result Date: 05/22/2023 CLINICAL DATA:  Back pain post fall. EXAM: LUMBAR SPINE - COMPLETE 4+ VIEW COMPARISON:  None Available. FINDINGS: There is no evidence of lumbar spine fracture. Alignment is normal. Intervertebral disc spaces are maintained. IMPRESSION: Negative. Electronically Signed   By: Ulanda Edison.D.  On: 05/22/2023 17:19        Scheduled Meds:  Chlorhexidine Gluconate Cloth  6 each Topical Q0600   docusate sodium  100 mg Oral BID   enoxaparin (LOVENOX) injection  40 mg Subcutaneous Q24H   gabapentin  600 mg Oral BID   levothyroxine  50 mcg Oral Q0600   methocarbamol  750 mg Oral BID   montelukast  10 mg Oral QHS   oxyCODONE-acetaminophen  1 tablet Oral Q8H   And   oxyCODONE  5 mg Oral Q8H   pantoprazole  40 mg Oral Daily   ziprasidone  80 mg Oral BID WC     LOS: 2 days    Time spent: 55 minutes    Carinne Brandenburger Hoover Brunette,  DO [facility]  If 7PM-7AM, please contact night-coverage [email or web address] 05/24/2023, 10:53 AM

## 2023-05-24 NOTE — Evaluation (Addendum)
Occupational Therapy Evaluation Patient Details Name: Kristen Ryan MRN: [id number] DOB: 1956-06-01 Today's Date: 05/24/2023   History of Present Illness Kristen Ryan is a 67 y.o. female with medical history significant of COPD, bipolar, hypertension, hypothyroidism, IBS.  Patient seen after a fall on Wednesday.  Her son who lives with her heard the fall and saw her wedged by the dresser.  She was on the ground for just a couple of minutes.  She returned to bed and had a lot of pain in her pelvis and was unable to ambulate and has been in bed for the majority of the time since her fall.  She has had decreased appetite.  Has a lot of pelvic pressure and pain, which is worse with walking and ambulation.  She has not been peeing as frequently. (per DO)   Clinical Impression   Pt agreeable to OT and PT co-evaluation. Pt requires assist for some ADL's at baseline. Today pt was very unsteady in standing needing mod to max A for transfers. Unable to complete bed mobility to the L side but only the R with CGA to min A today. Weak B UE and no family present to discuss pt's baseline cognition. Pt mildly confused at times with need for repetition of questions and additional cuing.  Pt will benefit from continued OT in the hospital and recommended venue below to increase strength, balance, and endurance for safe ADL's.         If plan is discharge home, recommend the following: A little help with walking and/or transfers;A little help with bathing/dressing/bathroom;Assistance with cooking/housework;Assist for transportation;Help with stairs or ramp for entrance    Functional Status Assessment  Patient has had a recent decline in their functional status and demonstrates the ability to make significant improvements in function in a reasonable and predictable amount of time.  Equipment Recommendations  None recommended by OT       Precautions / Restrictions   WBAT ; fall     Mobility Bed  Mobility Overal bed mobility: Needs Assistance Bed Mobility: Supine to Sit     Supine to sit: Contact guard, Min assist     General bed mobility comments: labored effort; unable to go to L side but only R today    Transfers Overall transfer level: Needs assistance Equipment used: Rolling walker (2 wheels) Transfers: Sit to/from Stand, Bed to chair/wheelchair/BSC Sit to Stand: Mod assist     Step pivot transfers: Mod assist, Max assist     General transfer comment: Difficulty bearing weight through L LE. Extended time and assist needed.      Balance Overall balance assessment: Needs assistance Sitting-balance support: No upper extremity supported, Feet supported Sitting balance-Leahy Scale: Fair Sitting balance - Comments: fair to good at EOB   Standing balance support: During functional activity, Bilateral upper extremity supported, Reliant on assistive device for balance Standing balance-Leahy Scale: Poor Standing balance comment: using RW                           ADL either performed or assessed with clinical judgement   ADL Overall ADL's : Needs assistance/impaired     Grooming: Contact guard assist;Set up;Sitting       Lower Body Bathing: Maximal assistance;Sitting/lateral leans       Lower Body Dressing: Maximal assistance;Sitting/lateral leans   Toilet Transfer: Moderate assistance;Maximal assistance;Stand-pivot;Rolling walker (2 wheels) Toilet Transfer Details (indicate cue type and reason): Simulated via EOB to  chair transfer. Toileting- Clothing Manipulation and Hygiene: Maximal assistance;Moderate assistance;Sitting/lateral lean       Functional mobility during ADLs: Maximal assistance;Moderate assistance;Rolling walker (2 wheels)       Vision Baseline Vision/History: 1 Wears glasses Ability to See in Adequate Light: 1 Impaired Patient Visual Report: Blurring of vision Vision Assessment?: Yes Ocular Range of Motion: Other  (comment) (mild difficulty needing multiple attempts at tracking to R lower quadrant. Pt seemed a bit dazed.) Additional Comments: Pt reported blurry vision but could read the board well several feet away.     Perception Perception: Not tested       Praxis Praxis: Not tested       Pertinent Vitals/Pain Pain Assessment Pain Assessment: 0-10 Pain Score: 10-Worst pain ever Pain Location: groin area Pain Descriptors / Indicators: Other (Comment) ("bad")     Extremity/Trunk Assessment Upper Extremity Assessment Upper Extremity Assessment: Generalized weakness   Lower Extremity Assessment Lower Extremity Assessment: Defer to PT evaluation   Cervical / Trunk Assessment Cervical / Trunk Assessment: Kyphotic   Communication Communication Communication: No apparent difficulties   Cognition Arousal: Lethargic Behavior During Therapy: WFL for tasks assessed/performed Overall Cognitive Status: No family/caregiver present to determine baseline cognitive functioning                                 General Comments: Pt oriented to date, but was not fully aware of place. Extended time and repeating of questions needed.                      Home Living Family/patient expects to be discharged to:: Private residence Living Arrangements: Children Available Help at Discharge: Family;Available PRN/intermittently Type of Home: House Home Access: Stairs to enter [employer] of Steps: 2-3 Entrance Stairs-Rails: Can reach both Home Layout: One level     Bathroom Shower/Tub: Producer, television/film/video: Standard Bathroom Accessibility: Yes How Accessible: Accessible via walker Home Equipment: Rolling Walker (2 wheels);Cane - single point;Wheelchair - manual;Shower seat - built in;Grab bars - tub/shower;Grab bars - toilet          Prior Functioning/Environment Prior Level of Function : Needs assist       Physical Assist : ADLs (physical)    ADLs (physical): IADLs;Bathing;Dressing Mobility Comments: Houshold ambulator with cane and RW. ADLs Comments: Assist for bathing, dressing, and IADL's. Pt repots being mostly independent for toileting.        OT Problem List: Decreased strength;Decreased range of motion;Decreased activity tolerance;Impaired balance (sitting and/or standing);Pain      OT Treatment/Interventions: Self-care/ADL training;Therapeutic exercise;DME and/or AE instruction;Therapeutic activities;Patient/family education;Balance training    OT Goals(Current goals can be found in the care plan section) Acute Rehab OT Goals Patient Stated Goal: improve function at rehab OT Goal Formulation: With patient Time For Goal Achievement: 06/07/23 Potential to Achieve Goals: Good  OT Frequency: Min 2X/week    Co-evaluation PT/OT/SLP Co-Evaluation/Treatment: Yes Reason for Co-Treatment: To address functional/ADL transfers   OT goals addressed during session: ADL's and self-care                       End of Session Equipment Utilized During Treatment: Rolling walker (2 wheels)  Activity Tolerance: Patient tolerated treatment well Patient left: in chair;with call bell/phone within reach;with chair alarm set  OT Visit Diagnosis: Unsteadiness on feet (R26.81);Other abnormalities of gait and mobility (R26.89);Muscle weakness (generalized) (M62.81);History of falling (  Z91.81);Other symptoms and signs involving cognitive function                Time: 1610-9604 OT Time Calculation (min): 21 min Charges:  OT General Charges $OT Visit: 1 Visit OT Evaluation $OT Eval Low Complexity: 1 Low  Lenice Koper OT, MOT  Danie Chandler 05/24/2023, 12:07 PM

## 2023-05-24 NOTE — Plan of Care (Signed)
  Problem: Acute Rehab OT Goals (only OT should resolve) Goal: Pt. Will Perform Grooming Flowsheets (Taken 05/24/2023 1210) Pt Will Perform Grooming:  with min assist  standing Goal: Pt. Will Perform Lower Body Bathing Flowsheets (Taken 05/24/2023 1210) Pt Will Perform Lower Body Bathing:  with min assist  sitting/lateral leans Goal: Pt. Will Perform Lower Body Dressing Flowsheets (Taken 05/24/2023 1210) Pt Will Perform Lower Body Dressing:  with min assist  sitting/lateral leans Goal: Pt. Will Transfer To Toilet Flowsheets (Taken 05/24/2023 1210) Pt Will Transfer to Toilet:  with min assist  stand pivot transfer Goal: Pt. Will Perform Toileting-Clothing Manipulation Flowsheets (Taken 05/24/2023 1210) Pt Will Perform Toileting - Clothing Manipulation and hygiene:  with contact guard assist  sitting/lateral leans  with min assist Goal: Pt/Caregiver Will Perform Home Exercise Program Flowsheets (Taken 05/24/2023 1210) Pt/caregiver will Perform Home Exercise Program:  Increased strength  Both right and left upper extremity  Independently  Kydan Shanholtzer OT, MOT

## 2023-05-24 NOTE — Progress Notes (Signed)
DOB: 2055-09-30 Date: 05/24/23   Must ID: [id number]   To Whom it May Concern:  Please be advised that the above named patient will require a short-term nursing home stay- anticipated 30 days or less rehabilitation and strengthening. The plan is for return home.

## 2023-05-24 NOTE — Plan of Care (Signed)
  Problem: Acute Rehab PT Goals(only PT should resolve) Goal: Pt Will Go Supine/Side To Sit Outcome: Progressing Flowsheets (Taken 05/24/2023 1402) Pt will go Supine/Side to Sit:  with minimal assist  with moderate assist Goal: Patient Will Transfer Sit To/From Stand Outcome: Progressing Flowsheets (Taken 05/24/2023 1402) Patient will transfer sit to/from stand:  with minimal assist  with moderate assist Goal: Pt Will Transfer Bed To Chair/Chair To Bed Outcome: Progressing Flowsheets (Taken 05/24/2023 1402) Pt will Transfer Bed to Chair/Chair to Bed:  with min assist  with mod assist Goal: Pt Will Ambulate Outcome: Progressing Flowsheets (Taken 05/24/2023 1402) Pt will Ambulate:  25 feet  with moderate assist  with minimal assist  with rolling walker   2:03 PM, 05/24/23 Ocie Bob, MPT Physical Therapist with [facility] [phone] office [phone] mobile phone

## 2023-05-24 NOTE — [place] FL2 (Signed)
[place] MEDICAID FL2 LEVEL OF CARE FORM     IDENTIFICATION  Patient Name: Kristen Ryan Birthdate: 1955-09-24 Sex: female Admission Date (Current Location): 05/22/2023  [place] and [place] Number:  Aaron Edelman [id number] [facility] Facility and Address:  [facility],  [address] [place] [address]      Provider Number: [id number]  Attending Physician Name and Address:  Erick Blinks, DO  Relative Name and Phone Number:       Current Level of Care: Hospital Recommended Level of Care: Skilled Nursing Facility Prior Approval Number:    Date Approved/Denied:   PASRR Number: pending  Discharge Plan: SNF    Current Diagnoses: Patient Active Problem List   Diagnosis Date Noted   Rhabdomyolysis 05/22/2023   Obstructive nephropathy 05/22/2023   Closed fracture of multiple pubic rami, left, initial encounter (HCC) 05/22/2023   Benzodiazepine misuse 07/13/2022   Polypharmacy 07/13/2022   Long term current use of antipsychotic medication 07/13/2022   Schizoaffective disorder (HCC) 06/08/2022   Generalized anxiety disorder with panic attacks 06/08/2022   Anorexia nervosa 01/27/2022   Migraine 02/19/2021   Essential hypertension 01/28/2021   Chronic pain syndrome 09/03/2020   SI joint arthritis (HCC) 09/03/2020   Chronic pain of left knee 09/03/2020   Hip pain 09/03/2020   Lumbar facet arthropathy 06/21/2019   Lumbar degenerative disc disease 06/21/2019   Cervical facet joint syndrome 06/21/2019   Sacroiliac joint pain 06/21/2019   PTSD (post-traumatic stress disorder) 12/11/2016   Well woman exam with routine gynecological exam 06/15/2016   COPD (chronic obstructive pulmonary disease) with acute bronchitis (HCC) 08/23/2015   Hypokalemia 08/03/2015   Normocytic anemia 08/03/2015   COPD with asthma (HCC)    Thyroid activity decreased    Midline thoracic back pain 08/21/2014   Abnormality of gait 08/21/2014   Urinary urgency 08/21/2014   Eosinophilic asthma  95/62/1308    Orientation RESPIRATION BLADDER Height & Weight     Self  Normal Indwelling catheter Weight: 114 lb 3.2 oz (51.8 kg) Height:  5\' 7"  (170.2 cm)  BEHAVIORAL SYMPTOMS/MOOD NEUROLOGICAL BOWEL NUTRITION STATUS      Incontinent Diet (See d/c summary for updates.)  AMBULATORY STATUS COMMUNICATION OF NEEDS Skin   Extensive Assist Verbally Normal                       Personal Care Assistance Level of Assistance  Feeding, Bathing, Dressing Bathing Assistance: Maximum assistance Feeding assistance: Limited assistance Dressing Assistance: Maximum assistance     Functional Limitations Info  Sight, Hearing, Speech Sight Info: Adequate Hearing Info: Adequate Speech Info: Adequate    SPECIAL CARE FACTORS FREQUENCY  PT (By licensed PT)     PT Frequency: 5x weekly              Contractures      Additional Factors Info  Code Status, Allergies, Psychotropic Code Status Info: Full code Allergies Info: Aminophylline  Penicillins  Sumatriptan  Theophylline  Bee Pollen  Codeine  Divalproex Sodium  Divalproex Sodium  Doxycycline  Hydroxyzine Pamoate  Lamotrigine  Lithium  Metronidazole  Nabumetone  Naproxen  Other  Pentazocine Lactate  Risperidone  Seroquel (Quetiapine)  Sulfamethoxazole-trimethoprim  Tegretol (Carbamazepine)  Vistaril (Hydroxyzine Hcl)  Clonidine Derivatives  Tetracycline  Zyprexa (Olanzapine) Psychotropic Info: Geodon         Current Medications (05/24/2023):  This is the current hospital active medication list Current Facility-Administered Medications  Medication Dose Route Frequency Provider Last Rate Last Admin  albuterol (PROVENTIL) (2.5 MG/3ML) 0.083% nebulizer solution 2.5 mg  2.5 mg Inhalation Q6H PRN Levie Heritage, DO       bisacodyl (DULCOLAX) suppository 10 mg  10 mg Rectal Daily PRN Levie Heritage, DO       Chlorhexidine Gluconate Cloth 2 % PADS 6 each  6 each Topical Q0600 Maurilio Lovely D, DO   6 each at 05/24/23 1002   docusate  sodium (COLACE) capsule 100 mg  100 mg Oral BID Levie Heritage, DO   100 mg at 05/24/23 1002   enoxaparin (LOVENOX) injection 40 mg  40 mg Subcutaneous Q24H Levie Heritage, DO   40 mg at 05/23/23 2136   gabapentin (NEURONTIN) capsule 600 mg  600 mg Oral BID Levie Heritage, DO   600 mg at 05/24/23 1006   HYDROmorphone (DILAUDID) injection 1 mg  1 mg Intravenous Q3H PRN Maurilio Lovely D, DO   1 mg at 05/24/23 1610   levothyroxine (SYNTHROID) tablet 50 mcg  50 mcg Oral [id number] Levie Heritage, DO   50 mcg at 05/24/23 0507   methocarbamol (ROBAXIN) tablet 750 mg  750 mg Oral BID Levie Heritage, DO   750 mg at 05/24/23 1002   montelukast (SINGULAIR) tablet 10 mg  10 mg Oral QHS Levie Heritage, DO   10 mg at 05/23/23 2136   ondansetron (ZOFRAN) tablet 4 mg  4 mg Oral Q6H PRN Levie Heritage, DO       Or   ondansetron [facility]) injection 4 mg  4 mg Intravenous Q6H PRN Levie Heritage, DO   4 mg at 05/23/23 0853   oxyCODONE-acetaminophen (PERCOCET/ROXICET) 5-325 MG per tablet 1 tablet  1 tablet Oral Q8H Shah, Pratik D, DO   1 tablet at 05/24/23 4098   And   oxyCODONE (Oxy IR/ROXICODONE) immediate release tablet 5 mg  5 mg Oral Q8H Shah, Pratik D, DO   5 mg at 05/24/23 0506   pantoprazole (PROTONIX) EC tablet 40 mg  40 mg Oral Daily Levie Heritage, DO   40 mg at 05/24/23 1002   polyethylene glycol (MIRALAX / GLYCOLAX) packet 17 g  17 g Oral Daily PRN Levie Heritage, DO       ziprasidone (GEODON) capsule 80 mg  80 mg Oral BID WC Levie Heritage, DO   80 mg at 05/24/23 0825     Discharge Medications: Please see discharge summary for a list of discharge medications.  Relevant Imaging Results:  Relevant Lab Results:   Additional Information SSN: [id number]  Karn Cassis, LCSW

## 2023-05-25 DIAGNOSIS — S32592D Other specified fracture of left pubis, subsequent encounter for fracture with routine healing: Secondary | ICD-10-CM | POA: Diagnosis not present

## 2023-05-25 DIAGNOSIS — S32592A Other specified fracture of left pubis, initial encounter for closed fracture: Secondary | ICD-10-CM | POA: Diagnosis not present

## 2023-05-25 DIAGNOSIS — R262 Difficulty in walking, not elsewhere classified: Secondary | ICD-10-CM | POA: Diagnosis not present

## 2023-05-25 DIAGNOSIS — M6289 Other specified disorders of muscle: Secondary | ICD-10-CM | POA: Diagnosis not present

## 2023-05-25 DIAGNOSIS — Z743 Need for continuous supervision: Secondary | ICD-10-CM | POA: Diagnosis not present

## 2023-05-25 DIAGNOSIS — R278 Other lack of coordination: Secondary | ICD-10-CM | POA: Diagnosis not present

## 2023-05-25 DIAGNOSIS — M6282 Rhabdomyolysis: Secondary | ICD-10-CM | POA: Diagnosis not present

## 2023-05-25 DIAGNOSIS — G894 Chronic pain syndrome: Secondary | ICD-10-CM | POA: Diagnosis not present

## 2023-05-25 DIAGNOSIS — Z7401 Bed confinement status: Secondary | ICD-10-CM | POA: Diagnosis not present

## 2023-05-25 DIAGNOSIS — N138 Other obstructive and reflux uropathy: Secondary | ICD-10-CM | POA: Diagnosis not present

## 2023-05-25 DIAGNOSIS — W010XXD Fall on same level from slipping, tripping and stumbling without subsequent striking against object, subsequent encounter: Secondary | ICD-10-CM | POA: Diagnosis not present

## 2023-05-25 DIAGNOSIS — J4489 Other specified chronic obstructive pulmonary disease: Secondary | ICD-10-CM | POA: Diagnosis not present

## 2023-05-25 DIAGNOSIS — K219 Gastro-esophageal reflux disease without esophagitis: Secondary | ICD-10-CM | POA: Diagnosis not present

## 2023-05-25 DIAGNOSIS — R3915 Urgency of urination: Secondary | ICD-10-CM | POA: Diagnosis not present

## 2023-05-25 DIAGNOSIS — I10 Essential (primary) hypertension: Secondary | ICD-10-CM | POA: Diagnosis not present

## 2023-05-25 DIAGNOSIS — R531 Weakness: Secondary | ICD-10-CM | POA: Diagnosis not present

## 2023-05-25 DIAGNOSIS — R5381 Other malaise: Secondary | ICD-10-CM | POA: Diagnosis not present

## 2023-05-25 DIAGNOSIS — D649 Anemia, unspecified: Secondary | ICD-10-CM | POA: Diagnosis not present

## 2023-05-25 LAB — CBC
HCT: 31.2 % — ABNORMAL LOW (ref 36.0–46.0)
Hemoglobin: 9.9 g/dL — ABNORMAL LOW (ref 12.0–15.0)
MCH: 29.5 pg (ref 26.0–34.0)
MCHC: 31.7 g/dL (ref 30.0–36.0)
MCV: 92.9 fL (ref 80.0–100.0)
Platelets: 165 10*3/uL (ref 150–400)
RBC: 3.36 MIL/uL — ABNORMAL LOW (ref 3.87–5.11)
RDW: 13.1 % (ref 11.5–15.5)
WBC: 7.4 10*3/uL (ref 4.0–10.5)
nRBC: 0 % (ref 0.0–0.2)

## 2023-05-25 LAB — BASIC METABOLIC PANEL
Anion gap: 9 (ref 5–15)
BUN: 14 mg/dL (ref 8–23)
CO2: 29 mmol/L (ref 22–32)
Calcium: 8.5 mg/dL — ABNORMAL LOW (ref 8.9–10.3)
Chloride: 100 mmol/L (ref 98–111)
Creatinine, Ser: 0.7 mg/dL (ref 0.44–1.00)
GFR, Estimated: >60 mL/min (ref >60)
Glucose, Bld: 99 mg/dL (ref 70–99)
Potassium: 4.1 mmol/L (ref 3.5–5.1)
Sodium: 138 mmol/L (ref 135–145)

## 2023-05-25 LAB — MAGNESIUM: Magnesium: 1.8 mg/dL (ref 1.7–2.4)

## 2023-05-25 LAB — CK: Total CK: 155 U/L (ref 38–234)

## 2023-05-25 MED ORDER — OXYCODONE-ACETAMINOPHEN 10-325 MG PO TABS: 1.0000 | ORAL_TABLET | Freq: Three times a day (TID) | ORAL | 0 refills | Status: AC

## 2023-05-25 MED ORDER — METHOCARBAMOL 750 MG PO TABS: 750.0000 mg | ORAL_TABLET | Freq: Two times a day (BID) | ORAL | 0 refills | Status: AC

## 2023-05-25 MED ORDER — ZIPRASIDONE HCL 80 MG PO CAPS: 80.0000 mg | ORAL_CAPSULE | Freq: Two times a day (BID) | ORAL | 0 refills | Status: AC

## 2023-05-25 MED ORDER — POLYETHYLENE GLYCOL 3350 17 G PO PACK: 17.0000 g | PACK | Freq: Every day | ORAL | 0 refills | Status: AC | PRN

## 2023-05-25 NOTE — Plan of Care (Signed)
  Problem: Education: Goal: Knowledge of General Education information will improve Description: Including pain rating scale, medication(s)/side effects and non-pharmacologic comfort measures Outcome: Completed/Met   Problem: Health Behavior/Discharge Planning: Goal: Ability to manage health-related needs will improve Outcome: Completed/Met

## 2023-05-25 NOTE — TOC Progression Note (Addendum)
Transition of Care [facility]) - Progression Note    Patient Details  Name: Kristen Ryan MRN: [id number] Date of Birth: 1956/04/26  [facility]) CM/SW Contact  Elliot Gault, LCSW Phone Number: 05/25/2023, 10:13 AM  Clinical Narrative:     TOC following. Updated pt's husband that pt does not have any SNF bed offers in [place] at this time. Requested permission to expand bed search and husband agreeable.  Sent out to additional facilities. Auth still pending. Will follow.  11:57 Reviewed new bed offers with pt's son who accepts [facility]. Will update insurance auth and assist with transfer arrangements when auth received.  Expected Discharge Plan: Skilled Nursing Facility Barriers to Discharge: Continued Medical Work up  Expected Discharge Plan and Services In-house Referral: Clinical Social Work   Post Acute Care Choice: Skilled Nursing Facility Living arrangements for the past 2 months: Single Family Home                                       Social Determinants of Health (SDOH) Interventions SDOH Screenings   Food Insecurity: No Food Insecurity (05/22/2023)  Housing: Low Risk  (05/22/2023)  Transportation Needs: No Transportation Needs (05/22/2023)  Utilities: Not At Risk (05/22/2023)  Depression (PHQ2-9): High Risk (10/20/2022)  Tobacco Use: Medium Risk (05/22/2023)    Readmission Risk Interventions    05/24/2023   11:04 AM  Readmission Risk Prevention Plan  Transportation Screening Complete  HRI or Home Care Consult Complete  Social Work Consult for Recovery Care Planning/Counseling Complete  Palliative Care Screening Not Applicable  Medication Review Oceanographer) Complete

## 2023-05-25 NOTE — Progress Notes (Signed)
[facility] updated that patient has been picked up by EMS.

## 2023-05-25 NOTE — Discharge Summary (Signed)
Physician Discharge Summary  Kristen Ryan WGN:[id number] DOB: [birth year] DOA: 05/22/2023  PCP: Leone Payor, FNP  Admit date: 05/22/2023  Discharge date: 05/25/2023  Admitted From:Home  Disposition:  SNF  Recommendations for Outpatient Follow-up:  Follow up with PCP in 1-2 weeks Continue on medications as noted below  Home Health: None  Equipment/Devices: None  Discharge Condition:Stable  CODE STATUS: Full  Diet recommendation: Heart Healthy  Brief/Interim Summary:  Kristen Ryan is a 67 y.o. female with medical history significant of COPD, bipolar, hypertension, hypothyroidism, IBS.  Patient seen after a fall on Wednesday.  Her son who lives with her heard the fall and saw her wedged by the dresser.  She was on the ground for just a couple of minutes.  She returned to bed and had a lot of pain in her pelvis and was unable to ambulate and has been in bed for the majority of the time since her fall.  She was admitted with AKI secondary to rhabdomyolysis related to fall and is also noted to have closed fracture of her pubic rami.  Her renal function has recovered with the use of IV fluid and she is requiring minimal pain medications.  PT recommending SNF placement.  No other acute events or concerns noted throughout the course of this admission.  Discharge Diagnoses:  Principal Problem:   Rhabdomyolysis Active Problems:   COPD (chronic obstructive pulmonary disease) with acute bronchitis (HCC)   Chronic pain syndrome   Essential hypertension   Obstructive nephropathy   Closed fracture of multiple pubic rami, left, initial encounter [facility])  Principal discharge diagnosis: AKI in the setting of rhabdomyolysis related to fall with associated pubic rami fracture.  Discharge Instructions  Discharge Instructions     Diet - low sodium heart healthy   Complete by: As directed    Increase activity slowly   Complete by: As directed       Allergies as of 05/25/2023        Reactions   Aminophylline Anaphylaxis   Penicillins Anaphylaxis, Hives, Nausea And Vomiting   immediate rash, facial/tongue/throat swelling, SOB or lightheadedness with hypotension   Sumatriptan Anaphylaxis, Other (See Comments)   Causes fainting   Theophylline Anaphylaxis   Bee Pollen Other (See Comments)   Codeine Itching, Other (See Comments)   Too strong for patient   Divalproex Sodium Other (See Comments)   Causes hair to fall out   Divalproex Sodium Nausea Only, Other (See Comments)   Hair loss  Causes hair to fall out   Doxycycline Other (See Comments)   headache   Hydroxyzine Pamoate    Lamotrigine Other (See Comments)   Destroys thyroid   Lithium Other (See Comments)   Adverse reaction to Thyroid    Metronidazole Other (See Comments)   Nabumetone Swelling   Naproxen Swelling   Other Other (See Comments)   Hair dye   Pentazocine Lactate Other (See Comments)   Unknown reaction   Risperidone Other (See Comments)   Insomnia    Seroquel [quetiapine] Swelling   Sulfamethoxazole-trimethoprim Other (See Comments)   Pt unsure, long time ago   Tegretol [carbamazepine] Nausea And Vomiting   Vistaril [hydroxyzine Hcl] Nausea And Vomiting   Clonidine Derivatives    Dizziness at 0.1 mg   Tetracycline Rash   Headaches   Zyprexa [olanzapine] Rash, Other (See Comments)   Insomnia        Medication List     TAKE these medications    acetaminophen 500 MG tablet Commonly  known as: TYLENOL Take 1,000 mg by mouth every 6 (six) hours as needed for mild pain (pain score 1-3).   albuterol 108 (90 Base) MCG/ACT inhaler Commonly known as: VENTOLIN HFA Inhale 1-2 puffs into the lungs every 6 (six) hours as needed for wheezing or shortness of breath.   calcium-vitamin D 500-200 MG-UNIT tablet Commonly known as: OSCAL WITH D Take 1 tablet by mouth 2 (two) times daily.   diclofenac Sodium 1 % Gel Commonly known as: VOLTAREN Apply 2-4 g topically daily as needed (pain).    ferrous sulfate 325 (65 FE) MG tablet Take 325 mg by mouth 2 (two) times daily before a meal.   gabapentin 600 MG tablet Commonly known as: NEURONTIN Take 1 tablet (600 mg total) by mouth 2 (two) times daily. TAKE (1) TABLET BY MOUTH TWICE DAILY.   Gemtesa 75 MG Tabs Generic drug: Vibegron Take 1 tablet by mouth daily.   levothyroxine 50 MCG tablet Commonly known as: SYNTHROID Take 1 tablet by mouth daily.   lisinopril 2.5 MG tablet Commonly known as: ZESTRIL Take 2.5 mg by mouth daily.   Magnesium 250 MG Tabs Take 400 mg by mouth every evening.   meclizine 25 MG tablet Commonly known as: ANTIVERT Take 25 mg by mouth 2 (two) times daily as needed for dizziness.   methocarbamol 750 MG tablet Commonly known as: ROBAXIN Take 1 tablet (750 mg total) by mouth 2 (two) times daily.   montelukast 10 MG tablet Commonly known as: SINGULAIR Take 10 mg by mouth at bedtime.   omeprazole 40 MG capsule Commonly known as: PRILOSEC Take 40 mg by mouth 2 (two) times daily before a meal.   ondansetron 4 MG disintegrating tablet Commonly known as: ZOFRAN-ODT 4 mg every 8 (eight) hours as needed for vomiting or nausea. Sublingual route   oxyCODONE-acetaminophen 10-325 MG tablet Commonly known as: PERCOCET Take 1 tablet by mouth every 8 (eight) hours.   polyethylene glycol 17 g packet Commonly known as: MIRALAX / GLYCOLAX Take 17 g by mouth daily as needed for mild constipation.   ziprasidone 80 MG capsule Commonly known as: Geodon Take 1 capsule (80 mg total) by mouth 2 (two) times daily with a meal.        Contact information for follow-up providers     Leone Payor, FNP. Schedule an appointment as soon as possible for a visit in 1 week(s).   Specialty: Family Medicine Contact information: [address] [place] [address] [phone]              Contact information for after-discharge care     Destination     [facility] Preferred SNF  .   Service: Skilled Nursing Contact information: [address] [place] [address] [phone]                    Allergies  Allergen Reactions   Aminophylline Anaphylaxis   Penicillins Anaphylaxis, Hives and Nausea And Vomiting    immediate rash, facial/tongue/throat swelling, SOB or lightheadedness with hypotension   Sumatriptan Anaphylaxis and Other (See Comments)    Causes fainting   Theophylline Anaphylaxis   Bee Pollen Other (See Comments)   Codeine Itching and Other (See Comments)    Too strong for patient   Divalproex Sodium Other (See Comments)    Causes hair to fall out   Divalproex Sodium Nausea Only and Other (See Comments)    Hair loss  Causes hair to fall out  Doxycycline Other (See Comments)    headache   Hydroxyzine Pamoate    Lamotrigine Other (See Comments)    Destroys thyroid   Lithium Other (See Comments)    Adverse reaction to Thyroid    Metronidazole Other (See Comments)   Nabumetone Swelling   Naproxen Swelling   Other Other (See Comments)    Hair dye   Pentazocine Lactate Other (See Comments)    Unknown reaction   Risperidone Other (See Comments)    Insomnia    Seroquel [Quetiapine] Swelling   Sulfamethoxazole-Trimethoprim Other (See Comments)    Pt unsure, long time ago   Tegretol [Carbamazepine] Nausea And Vomiting   Vistaril [Hydroxyzine Hcl] Nausea And Vomiting   Clonidine Derivatives     Dizziness at 0.1 mg   Tetracycline Rash    Headaches   Zyprexa [Olanzapine] Rash and Other (See Comments)    Insomnia    Consultations: None   Procedures/Studies: CT HEAD WO CONTRAST ( )  Result Date: 05/23/2023 CLINICAL DATA:  Initial evaluation for acute trauma, recent fall. Increased confusion. EXAM: CT HEAD WITHOUT CONTRAST TECHNIQUE: Contiguous axial images were obtained from the base of the skull through the vertex without intravenous contrast. RADIATION DOSE REDUCTION: This exam was performed according to the  departmental dose-optimization program which includes automated exposure control, adjustment of the mA and/or kV according to patient size and/or use of iterative reconstruction technique. COMPARISON:  Prior Study from 04/25/2018. FINDINGS: Brain: Cerebral volume within normal limits. Mild chronic microvascular ischemic disease. No acute intracranial hemorrhage. No acute large vessel territory infarct. No mass lesion, midline shift or mass effect. No hydrocephalus or extra-axial fluid collection. Vascular: No abnormal hyperdense vessel. Skull: Scalp soft tissues demonstrate no acute finding. Calvarium intact. Sinuses/Orbits: Globes and orbital soft tissues within normal limits. Mild mucosal thickening noted about the left maxillary sinus. Visualized paranasal sinuses are otherwise clear. No mastoid effusion. Other: None. IMPRESSION: 1. No acute intracranial abnormality. 2. Mild chronic microvascular ischemic disease. Electronically Signed   By: Rise Mu M.D.   On: 05/23/2023 02:06   DG HIP UNILAT WITH PELVIS 2-3 VIEWS LEFT  Result Date: 05/22/2023 CLINICAL DATA:  Pelvic pain after fall EXAM: DG HIP (WITH OR WITHOUT PELVIS) 2-3V LEFT COMPARISON:  09/10/2020 FINDINGS: Acute fracture of the left superior pubic ramus at the pubic root without significant displacement. A corresponding inferior pubic ramus fracture, although expected, is not definitively seen. Left hip intact without fracture or dislocation. No pelvic diastasis. IMPRESSION: Acute fracture of the left superior pubic ramus at the pubic root without significant displacement. Electronically Signed   By: Duanne Guess D.O.   On: 05/22/2023 17:19   DG Lumbar Spine Complete  Result Date: 05/22/2023 CLINICAL DATA:  Back pain post fall. EXAM: LUMBAR SPINE - COMPLETE 4+ VIEW COMPARISON:  None Available. FINDINGS: There is no evidence of lumbar spine fracture. Alignment is normal. Intervertebral disc spaces are maintained. IMPRESSION:  Negative. Electronically Signed   By: Ted Mcalpine M.D.   On: 05/22/2023 17:19     Discharge Exam: Vitals:   05/25/23 0345 05/25/23 1402  BP: (!) 151/87 (!) 162/94  Pulse: 79 78  Resp: 17 20  Temp: 98.8 F (37.1 C)   SpO2: 96% 94%   Vitals:   05/24/23 2316 05/24/23 2323 05/25/23 0345 05/25/23 1402  BP:   (!) 151/87 (!) 162/94  Pulse:   79 78  Resp:   17 20  Temp:   98.8 F (37.1 C)   TempSrc:   Axillary  SpO2: (!) 86% 97% 96% 94%  Weight:      Height:        General: Pt is alert, awake, not in acute distress Cardiovascular: RRR, S1/S2 +, no rubs, no gallops Respiratory: CTA bilaterally, no wheezing, no rhonchi Abdominal: Soft, NT, ND, bowel sounds + Extremities: no edema, no cyanosis    The results of significant diagnostics from this hospitalization (including imaging, microbiology, ancillary and laboratory) are listed below for reference.     Microbiology: No results found for this or any previous visit (from the past 240 hour(s)).   Labs: BNP (last 3 results) No results for input(s): "BNP" in the last 8760 hours. Basic Metabolic Panel: Recent Labs  Lab 05/22/23 1558 05/23/23 0737 05/24/23 0433 05/25/23 0448  NA 134* 133* 137 138  K 3.6 4.0 4.0 4.1  CL 93* 97* 102 100  CO2 29 26 27 29   GLUCOSE 120* 118* 107* 99  BUN 50* 24* 14 14  CREATININE 1.37* 0.76 0.66 0.70  CALCIUM 8.7* 8.4* 8.2* 8.5*  MG  --  1.7 1.6* 1.8   Liver Function Tests: Recent Labs  Lab 05/24/23 0433  AST 20  ALT 16  ALKPHOS 46  BILITOT 0.5  PROT 5.1*  ALBUMIN 2.3*   No results for input(s): "LIPASE", "AMYLASE" in the last 168 hours. No results for input(s): "AMMONIA" in the last 168 hours. CBC: Recent Labs  Lab 05/22/23 1558 05/23/23 0737 05/24/23 0433 05/25/23 0448  WBC 12.2* 10.7* 7.5 7.4  NEUTROABS 9.9*  --   --   --   HGB 11.9* 11.0* 9.2* 9.9*  HCT 38.3 35.3* 29.2* 31.2*  MCV 91.2 91.5 92.4 92.9  PLT 183 164 156 165   Cardiac Enzymes: Recent Labs   Lab 05/22/23 1629 05/22/23 2105 05/23/23 0438 05/24/23 0433 05/25/23 0448  CKTOTAL 1,597* 1,104* 843* 289* 155   BNP: Invalid input(s): "POCBNP" CBG: No results for input(s): "GLUCAP" in the last 168 hours. D-Dimer No results for input(s): "DDIMER" in the last 72 hours. Hgb A1c No results for input(s): "HGBA1C" in the last 72 hours. Lipid Profile No results for input(s): "CHOL", "HDL", "LDLCALC", "TRIG", "CHOLHDL", "LDLDIRECT" in the last 72 hours. Thyroid function studies No results for input(s): "TSH", "T4TOTAL", "T3FREE", "THYROIDAB" in the last 72 hours.  Invalid input(s): "FREET3" Anemia work up No results for input(s): "VITAMINB12", "FOLATE", "FERRITIN", "TIBC", "IRON", "RETICCTPCT" in the last 72 hours. Urinalysis    Component Value Date/Time   COLORURINE YELLOW 05/22/2023 1541   APPEARANCEUR CLEAR 05/22/2023 1541   LABSPEC 1.012 05/22/2023 1541   PHURINE 5.0 05/22/2023 1541   GLUCOSEU NEGATIVE 05/22/2023 1541   HGBUR SMALL (A) 05/22/2023 1541   BILIRUBINUR NEGATIVE 05/22/2023 1541   KETONESUR NEGATIVE 05/22/2023 1541   PROTEINUR NEGATIVE 05/22/2023 1541   UROBILINOGEN 0.2 06/22/2013 0020   NITRITE NEGATIVE 05/22/2023 1541   LEUKOCYTESUR TRACE (A) 05/22/2023 1541   Sepsis Labs Recent Labs  Lab 05/22/23 1558 05/23/23 0737 05/24/23 0433 05/25/23 0448  WBC 12.2* 10.7* 7.5 7.4   Microbiology No results found for this or any previous visit (from the past 240 hour(s)).   Time coordinating discharge: 35 minutes  SIGNED:   Erick Blinks, DO [facility] 05/25/2023, 2:35 PM  If 7PM-7AM, please contact night-coverage [email or web address]

## 2023-05-25 NOTE — TOC Transition Note (Signed)
Transition of Care [facility]) - CM/SW Discharge Note   Patient Details  Name: Kristen Ryan MRN: [id number] Date of Birth: [birth year]  [facility]) CM/SW Contact:  Elliot Gault, LCSW Phone Number: 05/25/2023, 2:36 PM   Clinical Narrative:     Pt/family agreeable to [facility]. Insurance auth received and [employer] can accept pt today.   Updated pt's husband who is in agreement with dc plan.  DC clinical sent electronically. RN to call report. EMS arranged.  No other TOC needs for dc.  Final next level of care: Skilled Nursing Facility Barriers to Discharge: Barriers Resolved   Patient Goals and CMS Choice CMS Medicare.gov Compare Post Acute Care list provided to:: Patient Represenative (must comment) Choice offered to / list presented to : Adult Children  Discharge Placement                Patient [facility] bed at: [facility] Patient to be transferred to facility by: EMS Name of family member notified: Tyleigh Cabo Patient and family notified of of transfer: 05/25/23  Discharge Plan and Services Additional resources added to the After Visit Summary for   In-house Referral: Clinical Social Work   Post Acute Care Choice: Skilled Nursing Facility                               Social Determinants of Health (SDOH) Interventions SDOH Screenings   Food Insecurity: No Food Insecurity (05/22/2023)  Housing: Low Risk  (05/22/2023)  Transportation Needs: No Transportation Needs (05/22/2023)  Utilities: Not At Risk (05/22/2023)  Depression (PHQ2-9): High Risk (10/20/2022)  Tobacco Use: Medium Risk (05/22/2023)     Readmission Risk Interventions    05/24/2023   11:04 AM  Readmission Risk Prevention Plan  Transportation Screening Complete  HRI or Home Care Consult Complete  Social Work Consult for Recovery Care Planning/Counseling Complete  Palliative Care Screening Not Applicable  Medication Review Oceanographer) Complete

## 2023-05-25 NOTE — Care Management Important Message (Signed)
Important Message  Patient Details  Name: Kristen Ryan MRN: [id number] Date of Birth: 09-Nov-1955   Important Message Given:  N/A - LOS <3 / Initial given by admissions     Corey Harold 05/25/2023, 3:27 PM

## 2023-05-25 NOTE — Progress Notes (Addendum)
Has voided since foley removal. Was drowsy earlier in day but has been alert and oriented x 3 this afternoon.  Talking to son on phone.   Report called to Deloris at [facility].

## 2023-05-26 ENCOUNTER — Other Ambulatory Visit: Payer: Self-pay | Admitting: *Deleted

## 2023-05-26 DIAGNOSIS — N138 Other obstructive and reflux uropathy: Secondary | ICD-10-CM | POA: Diagnosis not present

## 2023-05-26 DIAGNOSIS — S32592D Other specified fracture of left pubis, subsequent encounter for fracture with routine healing: Secondary | ICD-10-CM | POA: Diagnosis not present

## 2023-05-26 DIAGNOSIS — R3915 Urgency of urination: Secondary | ICD-10-CM | POA: Diagnosis not present

## 2023-05-26 DIAGNOSIS — K219 Gastro-esophageal reflux disease without esophagitis: Secondary | ICD-10-CM | POA: Diagnosis not present

## 2023-05-26 DIAGNOSIS — D649 Anemia, unspecified: Secondary | ICD-10-CM | POA: Diagnosis not present

## 2023-05-26 DIAGNOSIS — G894 Chronic pain syndrome: Secondary | ICD-10-CM | POA: Diagnosis not present

## 2023-05-26 DIAGNOSIS — W010XXD Fall on same level from slipping, tripping and stumbling without subsequent striking against object, subsequent encounter: Secondary | ICD-10-CM | POA: Diagnosis not present

## 2023-05-26 DIAGNOSIS — M6282 Rhabdomyolysis: Secondary | ICD-10-CM | POA: Diagnosis not present

## 2023-05-26 DIAGNOSIS — J4489 Other specified chronic obstructive pulmonary disease: Secondary | ICD-10-CM | POA: Diagnosis not present

## 2023-05-26 DIAGNOSIS — I10 Essential (primary) hypertension: Secondary | ICD-10-CM | POA: Diagnosis not present

## 2023-05-26 NOTE — Patient Outreach (Signed)
Per [facility] Mrs. Mcgrain resides in [facility] skilled nursing facility. Screening for potential care coordination/chronic complex care management services as benefit of health plan and Primary Care Provider.   Secure communication sent to [place], SNF social worker, to make aware writer will follow transition plans and for potential care coordination/chronic care management needs.  Raiford Noble, MSN, RN, BSN [place]  [facility] RN Post- Acute Care Manager Direct Dial: [phone]

## 2023-05-31 DIAGNOSIS — R5381 Other malaise: Secondary | ICD-10-CM | POA: Diagnosis not present

## 2023-05-31 DIAGNOSIS — S32592A Other specified fracture of left pubis, initial encounter for closed fracture: Secondary | ICD-10-CM | POA: Diagnosis not present

## 2023-06-09 DIAGNOSIS — W010XXD Fall on same level from slipping, tripping and stumbling without subsequent striking against object, subsequent encounter: Secondary | ICD-10-CM | POA: Diagnosis not present

## 2023-06-09 DIAGNOSIS — K219 Gastro-esophageal reflux disease without esophagitis: Secondary | ICD-10-CM | POA: Diagnosis not present

## 2023-06-09 DIAGNOSIS — M6282 Rhabdomyolysis: Secondary | ICD-10-CM | POA: Diagnosis not present

## 2023-06-09 DIAGNOSIS — I10 Essential (primary) hypertension: Secondary | ICD-10-CM | POA: Diagnosis not present

## 2023-06-09 DIAGNOSIS — D649 Anemia, unspecified: Secondary | ICD-10-CM | POA: Diagnosis not present

## 2023-06-09 DIAGNOSIS — J4489 Other specified chronic obstructive pulmonary disease: Secondary | ICD-10-CM | POA: Diagnosis not present

## 2023-06-09 DIAGNOSIS — G894 Chronic pain syndrome: Secondary | ICD-10-CM | POA: Diagnosis not present

## 2023-06-09 DIAGNOSIS — N138 Other obstructive and reflux uropathy: Secondary | ICD-10-CM | POA: Diagnosis not present

## 2023-06-09 DIAGNOSIS — R5381 Other malaise: Secondary | ICD-10-CM | POA: Diagnosis not present

## 2023-06-09 DIAGNOSIS — S32592A Other specified fracture of left pubis, initial encounter for closed fracture: Secondary | ICD-10-CM | POA: Diagnosis not present

## 2023-06-09 DIAGNOSIS — S32592D Other specified fracture of left pubis, subsequent encounter for fracture with routine healing: Secondary | ICD-10-CM | POA: Diagnosis not present

## 2023-06-10 DIAGNOSIS — R262 Difficulty in walking, not elsewhere classified: Secondary | ICD-10-CM | POA: Diagnosis not present

## 2023-06-10 DIAGNOSIS — I10 Essential (primary) hypertension: Secondary | ICD-10-CM | POA: Diagnosis not present

## 2023-06-10 DIAGNOSIS — J4489 Other specified chronic obstructive pulmonary disease: Secondary | ICD-10-CM | POA: Diagnosis not present

## 2023-06-10 DIAGNOSIS — M6289 Other specified disorders of muscle: Secondary | ICD-10-CM | POA: Diagnosis not present

## 2023-06-12 DIAGNOSIS — J4489 Other specified chronic obstructive pulmonary disease: Secondary | ICD-10-CM | POA: Diagnosis not present

## 2023-06-12 DIAGNOSIS — S32592D Other specified fracture of left pubis, subsequent encounter for fracture with routine healing: Secondary | ICD-10-CM | POA: Diagnosis not present

## 2023-06-12 DIAGNOSIS — E039 Hypothyroidism, unspecified: Secondary | ICD-10-CM | POA: Diagnosis not present

## 2023-06-12 DIAGNOSIS — I10 Essential (primary) hypertension: Secondary | ICD-10-CM | POA: Diagnosis not present

## 2023-06-17 DIAGNOSIS — E039 Hypothyroidism, unspecified: Secondary | ICD-10-CM | POA: Diagnosis not present

## 2023-06-17 DIAGNOSIS — J4489 Other specified chronic obstructive pulmonary disease: Secondary | ICD-10-CM | POA: Diagnosis not present

## 2023-06-17 DIAGNOSIS — I10 Essential (primary) hypertension: Secondary | ICD-10-CM | POA: Diagnosis not present

## 2023-06-17 DIAGNOSIS — S32592D Other specified fracture of left pubis, subsequent encounter for fracture with routine healing: Secondary | ICD-10-CM | POA: Diagnosis not present

## 2023-06-18 DIAGNOSIS — S32592D Other specified fracture of left pubis, subsequent encounter for fracture with routine healing: Secondary | ICD-10-CM | POA: Diagnosis not present

## 2023-06-18 DIAGNOSIS — I10 Essential (primary) hypertension: Secondary | ICD-10-CM | POA: Diagnosis not present

## 2023-06-18 DIAGNOSIS — J4489 Other specified chronic obstructive pulmonary disease: Secondary | ICD-10-CM | POA: Diagnosis not present

## 2023-06-18 DIAGNOSIS — E039 Hypothyroidism, unspecified: Secondary | ICD-10-CM | POA: Diagnosis not present

## 2023-06-18 DIAGNOSIS — J449 Chronic obstructive pulmonary disease, unspecified: Secondary | ICD-10-CM | POA: Diagnosis not present

## 2023-06-22 DIAGNOSIS — I10 Essential (primary) hypertension: Secondary | ICD-10-CM | POA: Diagnosis not present

## 2023-06-22 DIAGNOSIS — E039 Hypothyroidism, unspecified: Secondary | ICD-10-CM | POA: Diagnosis not present

## 2023-06-22 DIAGNOSIS — S32592D Other specified fracture of left pubis, subsequent encounter for fracture with routine healing: Secondary | ICD-10-CM | POA: Diagnosis not present

## 2023-06-22 DIAGNOSIS — L89312 Pressure ulcer of right buttock, stage 2: Secondary | ICD-10-CM | POA: Diagnosis not present

## 2023-06-22 DIAGNOSIS — J4489 Other specified chronic obstructive pulmonary disease: Secondary | ICD-10-CM | POA: Diagnosis not present

## 2023-07-19 DIAGNOSIS — J449 Chronic obstructive pulmonary disease, unspecified: Secondary | ICD-10-CM | POA: Diagnosis not present

## 2023-07-23 DIAGNOSIS — E039 Hypothyroidism, unspecified: Secondary | ICD-10-CM | POA: Diagnosis not present

## 2023-07-23 DIAGNOSIS — S32511D Fracture of superior rim of right pubis, subsequent encounter for fracture with routine healing: Secondary | ICD-10-CM | POA: Diagnosis not present

## 2023-07-23 DIAGNOSIS — J449 Chronic obstructive pulmonary disease, unspecified: Secondary | ICD-10-CM | POA: Diagnosis not present

## 2023-07-23 DIAGNOSIS — G894 Chronic pain syndrome: Secondary | ICD-10-CM | POA: Diagnosis not present

## 2023-07-23 DIAGNOSIS — R110 Nausea: Secondary | ICD-10-CM | POA: Diagnosis not present

## 2023-07-23 DIAGNOSIS — G43909 Migraine, unspecified, not intractable, without status migrainosus: Secondary | ICD-10-CM | POA: Diagnosis not present

## 2023-07-23 DIAGNOSIS — M6282 Rhabdomyolysis: Secondary | ICD-10-CM | POA: Diagnosis not present

## 2023-07-23 DIAGNOSIS — N1832 Chronic kidney disease, stage 3b: Secondary | ICD-10-CM | POA: Diagnosis not present

## 2023-07-23 DIAGNOSIS — D649 Anemia, unspecified: Secondary | ICD-10-CM | POA: Diagnosis not present

## 2023-07-23 DIAGNOSIS — I129 Hypertensive chronic kidney disease with stage 1 through stage 4 chronic kidney disease, or unspecified chronic kidney disease: Secondary | ICD-10-CM | POA: Diagnosis not present

## 2023-08-07 DIAGNOSIS — R262 Difficulty in walking, not elsewhere classified: Secondary | ICD-10-CM | POA: Diagnosis not present

## 2023-08-07 DIAGNOSIS — I10 Essential (primary) hypertension: Secondary | ICD-10-CM | POA: Diagnosis not present

## 2023-08-07 DIAGNOSIS — M6289 Other specified disorders of muscle: Secondary | ICD-10-CM | POA: Diagnosis not present

## 2023-08-07 DIAGNOSIS — J4489 Other specified chronic obstructive pulmonary disease: Secondary | ICD-10-CM | POA: Diagnosis not present

## 2023-08-13 DIAGNOSIS — G894 Chronic pain syndrome: Secondary | ICD-10-CM | POA: Diagnosis not present

## 2023-08-13 DIAGNOSIS — L89312 Pressure ulcer of right buttock, stage 2: Secondary | ICD-10-CM | POA: Diagnosis not present

## 2023-08-13 DIAGNOSIS — Z7182 Exercise counseling: Secondary | ICD-10-CM | POA: Diagnosis not present

## 2023-08-13 DIAGNOSIS — Z87891 Personal history of nicotine dependence: Secondary | ICD-10-CM | POA: Diagnosis not present

## 2023-08-13 DIAGNOSIS — R42 Dizziness and giddiness: Secondary | ICD-10-CM | POA: Diagnosis not present

## 2023-08-13 DIAGNOSIS — G43909 Migraine, unspecified, not intractable, without status migrainosus: Secondary | ICD-10-CM | POA: Diagnosis not present

## 2023-08-13 DIAGNOSIS — I10 Essential (primary) hypertension: Secondary | ICD-10-CM | POA: Diagnosis not present

## 2023-08-13 DIAGNOSIS — Z713 Dietary counseling and surveillance: Secondary | ICD-10-CM | POA: Diagnosis not present

## 2023-08-13 DIAGNOSIS — E441 Mild protein-calorie malnutrition: Secondary | ICD-10-CM | POA: Diagnosis not present

## 2023-08-19 DIAGNOSIS — J449 Chronic obstructive pulmonary disease, unspecified: Secondary | ICD-10-CM | POA: Diagnosis not present

## 2023-09-08 DIAGNOSIS — J4489 Other specified chronic obstructive pulmonary disease: Secondary | ICD-10-CM | POA: Diagnosis not present

## 2023-09-08 DIAGNOSIS — R262 Difficulty in walking, not elsewhere classified: Secondary | ICD-10-CM | POA: Diagnosis not present

## 2023-09-08 DIAGNOSIS — I10 Essential (primary) hypertension: Secondary | ICD-10-CM | POA: Diagnosis not present

## 2023-09-08 DIAGNOSIS — M6289 Other specified disorders of muscle: Secondary | ICD-10-CM | POA: Diagnosis not present

## 2023-09-09 DIAGNOSIS — D649 Anemia, unspecified: Secondary | ICD-10-CM | POA: Diagnosis not present

## 2023-09-09 DIAGNOSIS — E039 Hypothyroidism, unspecified: Secondary | ICD-10-CM | POA: Diagnosis not present

## 2023-09-09 DIAGNOSIS — I10 Essential (primary) hypertension: Secondary | ICD-10-CM | POA: Diagnosis not present

## 2023-09-10 DIAGNOSIS — I10 Essential (primary) hypertension: Secondary | ICD-10-CM | POA: Diagnosis not present

## 2023-09-10 DIAGNOSIS — I129 Hypertensive chronic kidney disease with stage 1 through stage 4 chronic kidney disease, or unspecified chronic kidney disease: Secondary | ICD-10-CM | POA: Diagnosis not present

## 2023-09-10 DIAGNOSIS — E441 Mild protein-calorie malnutrition: Secondary | ICD-10-CM | POA: Diagnosis not present

## 2023-09-10 DIAGNOSIS — E039 Hypothyroidism, unspecified: Secondary | ICD-10-CM | POA: Diagnosis not present

## 2023-09-10 DIAGNOSIS — N1832 Chronic kidney disease, stage 3b: Secondary | ICD-10-CM | POA: Diagnosis not present

## 2023-09-10 DIAGNOSIS — G894 Chronic pain syndrome: Secondary | ICD-10-CM | POA: Diagnosis not present

## 2023-09-10 DIAGNOSIS — G43909 Migraine, unspecified, not intractable, without status migrainosus: Secondary | ICD-10-CM | POA: Diagnosis not present

## 2023-09-10 DIAGNOSIS — S32511D Fracture of superior rim of right pubis, subsequent encounter for fracture with routine healing: Secondary | ICD-10-CM | POA: Diagnosis not present

## 2023-09-10 DIAGNOSIS — E782 Mixed hyperlipidemia: Secondary | ICD-10-CM | POA: Diagnosis not present

## 2023-09-16 DIAGNOSIS — J449 Chronic obstructive pulmonary disease, unspecified: Secondary | ICD-10-CM | POA: Diagnosis not present

## 2023-10-09 DIAGNOSIS — M6289 Other specified disorders of muscle: Secondary | ICD-10-CM | POA: Diagnosis not present

## 2023-10-09 DIAGNOSIS — R262 Difficulty in walking, not elsewhere classified: Secondary | ICD-10-CM | POA: Diagnosis not present

## 2023-10-09 DIAGNOSIS — J4489 Other specified chronic obstructive pulmonary disease: Secondary | ICD-10-CM | POA: Diagnosis not present

## 2023-10-09 DIAGNOSIS — I10 Essential (primary) hypertension: Secondary | ICD-10-CM | POA: Diagnosis not present

## 2023-10-15 DIAGNOSIS — R110 Nausea: Secondary | ICD-10-CM | POA: Diagnosis not present

## 2023-10-17 DIAGNOSIS — J449 Chronic obstructive pulmonary disease, unspecified: Secondary | ICD-10-CM | POA: Diagnosis not present

## 2023-11-08 DIAGNOSIS — I10 Essential (primary) hypertension: Secondary | ICD-10-CM | POA: Diagnosis not present

## 2023-11-08 DIAGNOSIS — M6289 Other specified disorders of muscle: Secondary | ICD-10-CM | POA: Diagnosis not present

## 2023-11-08 DIAGNOSIS — J4489 Other specified chronic obstructive pulmonary disease: Secondary | ICD-10-CM | POA: Diagnosis not present

## 2023-11-08 DIAGNOSIS — R262 Difficulty in walking, not elsewhere classified: Secondary | ICD-10-CM | POA: Diagnosis not present

## 2023-11-16 DIAGNOSIS — J449 Chronic obstructive pulmonary disease, unspecified: Secondary | ICD-10-CM | POA: Diagnosis not present

## 2023-12-09 DIAGNOSIS — M6289 Other specified disorders of muscle: Secondary | ICD-10-CM | POA: Diagnosis not present

## 2023-12-09 DIAGNOSIS — J4489 Other specified chronic obstructive pulmonary disease: Secondary | ICD-10-CM | POA: Diagnosis not present

## 2023-12-09 DIAGNOSIS — R262 Difficulty in walking, not elsewhere classified: Secondary | ICD-10-CM | POA: Diagnosis not present

## 2023-12-09 DIAGNOSIS — I10 Essential (primary) hypertension: Secondary | ICD-10-CM | POA: Diagnosis not present

## 2023-12-10 DIAGNOSIS — I10 Essential (primary) hypertension: Secondary | ICD-10-CM | POA: Diagnosis not present

## 2023-12-10 DIAGNOSIS — G894 Chronic pain syndrome: Secondary | ICD-10-CM | POA: Diagnosis not present

## 2023-12-10 DIAGNOSIS — E441 Mild protein-calorie malnutrition: Secondary | ICD-10-CM | POA: Diagnosis not present

## 2023-12-10 DIAGNOSIS — G43909 Migraine, unspecified, not intractable, without status migrainosus: Secondary | ICD-10-CM | POA: Diagnosis not present

## 2023-12-10 DIAGNOSIS — Z79899 Other long term (current) drug therapy: Secondary | ICD-10-CM | POA: Diagnosis not present

## 2023-12-10 DIAGNOSIS — K219 Gastro-esophageal reflux disease without esophagitis: Secondary | ICD-10-CM | POA: Diagnosis not present

## 2023-12-10 DIAGNOSIS — J449 Chronic obstructive pulmonary disease, unspecified: Secondary | ICD-10-CM | POA: Diagnosis not present

## 2023-12-10 DIAGNOSIS — Z87891 Personal history of nicotine dependence: Secondary | ICD-10-CM | POA: Diagnosis not present

## 2023-12-10 DIAGNOSIS — M545 Low back pain, unspecified: Secondary | ICD-10-CM | POA: Diagnosis not present

## 2023-12-10 DIAGNOSIS — N3281 Overactive bladder: Secondary | ICD-10-CM | POA: Diagnosis not present

## 2023-12-17 DIAGNOSIS — J449 Chronic obstructive pulmonary disease, unspecified: Secondary | ICD-10-CM | POA: Diagnosis not present

## 2024-01-08 DIAGNOSIS — M6289 Other specified disorders of muscle: Secondary | ICD-10-CM | POA: Diagnosis not present

## 2024-01-08 DIAGNOSIS — R262 Difficulty in walking, not elsewhere classified: Secondary | ICD-10-CM | POA: Diagnosis not present

## 2024-01-08 DIAGNOSIS — J4489 Other specified chronic obstructive pulmonary disease: Secondary | ICD-10-CM | POA: Diagnosis not present

## 2024-01-08 DIAGNOSIS — I10 Essential (primary) hypertension: Secondary | ICD-10-CM | POA: Diagnosis not present

## 2024-01-14 DIAGNOSIS — D649 Anemia, unspecified: Secondary | ICD-10-CM | POA: Diagnosis not present

## 2024-01-14 DIAGNOSIS — E039 Hypothyroidism, unspecified: Secondary | ICD-10-CM | POA: Diagnosis not present

## 2024-01-14 DIAGNOSIS — I10 Essential (primary) hypertension: Secondary | ICD-10-CM | POA: Diagnosis not present

## 2024-01-16 DIAGNOSIS — J449 Chronic obstructive pulmonary disease, unspecified: Secondary | ICD-10-CM | POA: Diagnosis not present

## 2024-01-20 DIAGNOSIS — I129 Hypertensive chronic kidney disease with stage 1 through stage 4 chronic kidney disease, or unspecified chronic kidney disease: Secondary | ICD-10-CM | POA: Diagnosis not present

## 2024-01-20 DIAGNOSIS — I10 Essential (primary) hypertension: Secondary | ICD-10-CM | POA: Diagnosis not present

## 2024-01-20 DIAGNOSIS — M545 Low back pain, unspecified: Secondary | ICD-10-CM | POA: Diagnosis not present

## 2024-01-20 DIAGNOSIS — N3281 Overactive bladder: Secondary | ICD-10-CM | POA: Diagnosis not present

## 2024-01-20 DIAGNOSIS — J449 Chronic obstructive pulmonary disease, unspecified: Secondary | ICD-10-CM | POA: Diagnosis not present

## 2024-01-20 DIAGNOSIS — E441 Mild protein-calorie malnutrition: Secondary | ICD-10-CM | POA: Diagnosis not present

## 2024-01-20 DIAGNOSIS — G43909 Migraine, unspecified, not intractable, without status migrainosus: Secondary | ICD-10-CM | POA: Diagnosis not present

## 2024-01-20 DIAGNOSIS — G894 Chronic pain syndrome: Secondary | ICD-10-CM | POA: Diagnosis not present

## 2024-01-20 DIAGNOSIS — K219 Gastro-esophageal reflux disease without esophagitis: Secondary | ICD-10-CM | POA: Diagnosis not present

## 2024-02-08 DIAGNOSIS — R262 Difficulty in walking, not elsewhere classified: Secondary | ICD-10-CM | POA: Diagnosis not present

## 2024-02-08 DIAGNOSIS — M6289 Other specified disorders of muscle: Secondary | ICD-10-CM | POA: Diagnosis not present

## 2024-02-08 DIAGNOSIS — J4489 Other specified chronic obstructive pulmonary disease: Secondary | ICD-10-CM | POA: Diagnosis not present

## 2024-02-08 DIAGNOSIS — I10 Essential (primary) hypertension: Secondary | ICD-10-CM | POA: Diagnosis not present

## 2024-02-13 ENCOUNTER — Emergency Department (HOSPITAL_COMMUNITY)
Admission: EM | Admit: 2024-02-13 | Discharge: 2024-02-13 | Disposition: A | Discharge status: Home / Self Care | Source: Ambulatory Visit | Attending: Emergency Medicine | Admitting: Emergency Medicine

## 2024-02-13 ENCOUNTER — Encounter (HOSPITAL_COMMUNITY): Payer: Self-pay

## 2024-02-13 ENCOUNTER — Emergency Department (HOSPITAL_COMMUNITY)

## 2024-02-13 ENCOUNTER — Other Ambulatory Visit: Payer: Self-pay

## 2024-02-13 DIAGNOSIS — E876 Hypokalemia: Secondary | ICD-10-CM | POA: Diagnosis not present

## 2024-02-13 DIAGNOSIS — R059 Cough, unspecified: Secondary | ICD-10-CM | POA: Diagnosis present

## 2024-02-13 DIAGNOSIS — Z7951 Long term (current) use of inhaled steroids: Secondary | ICD-10-CM | POA: Insufficient documentation

## 2024-02-13 DIAGNOSIS — R0602 Shortness of breath: Secondary | ICD-10-CM | POA: Diagnosis not present

## 2024-02-13 DIAGNOSIS — J441 Chronic obstructive pulmonary disease with (acute) exacerbation: Secondary | ICD-10-CM | POA: Diagnosis not present

## 2024-02-13 DIAGNOSIS — D649 Anemia, unspecified: Secondary | ICD-10-CM | POA: Diagnosis not present

## 2024-02-13 DIAGNOSIS — J439 Emphysema, unspecified: Secondary | ICD-10-CM | POA: Diagnosis not present

## 2024-02-13 DIAGNOSIS — U071 COVID-19: Secondary | ICD-10-CM | POA: Diagnosis not present

## 2024-02-13 LAB — BLOOD GAS, VENOUS
Acid-Base Excess: 6.8 mmol/L — ABNORMAL HIGH (ref 0.0–2.0)
Bicarbonate: 31.3 mmol/L — ABNORMAL HIGH (ref 20.0–28.0)
Drawn by: 7049
O2 Saturation: 94.6 %
Patient temperature: 36.9
pCO2, Ven: 43 mmHg — ABNORMAL LOW (ref 44–60)
pH, Ven: 7.47 — ABNORMAL HIGH (ref 7.25–7.43)
pO2, Ven: 60 mmHg — ABNORMAL HIGH (ref 32–45)

## 2024-02-13 LAB — CBC WITH DIFFERENTIAL/PLATELET
Abs Immature Granulocytes: 0.02 K/uL (ref 0.00–0.07)
Basophils Absolute: 0 K/uL (ref 0.0–0.1)
Basophils Relative: 0 %
Eosinophils Absolute: 0.2 K/uL (ref 0.0–0.5)
Eosinophils Relative: 3 %
HCT: 38 % (ref 36.0–46.0)
Hemoglobin: 11.8 g/dL — ABNORMAL LOW (ref 12.0–15.0)
Immature Granulocytes: 0 %
Lymphocytes Relative: 24 %
Lymphs Abs: 1.3 K/uL (ref 0.7–4.0)
MCH: 29 pg (ref 26.0–34.0)
MCHC: 31.1 g/dL (ref 30.0–36.0)
MCV: 93.4 fL (ref 80.0–100.0)
Monocytes Absolute: 0.5 K/uL (ref 0.1–1.0)
Monocytes Relative: 9 %
Neutro Abs: 3.4 K/uL (ref 1.7–7.7)
Neutrophils Relative %: 64 %
Platelets: 223 K/uL (ref 150–400)
RBC: 4.07 MIL/uL (ref 3.87–5.11)
RDW: 13.2 % (ref 11.5–15.5)
WBC: 5.5 K/uL (ref 4.0–10.5)
nRBC: 0 % (ref 0.0–0.2)

## 2024-02-13 LAB — BASIC METABOLIC PANEL WITH GFR
Anion gap: 12 (ref 5–15)
BUN: 13 mg/dL (ref 8–23)
CO2: 27 mmol/L (ref 22–32)
Calcium: 8.6 mg/dL — ABNORMAL LOW (ref 8.9–10.3)
Chloride: 99 mmol/L (ref 98–111)
Creatinine, Ser: 0.91 mg/dL (ref 0.44–1.00)
GFR, Estimated: >60 mL/min (ref >60)
Glucose, Bld: 88 mg/dL (ref 70–99)
Potassium: 3.2 mmol/L — ABNORMAL LOW (ref 3.5–5.1)
Sodium: 138 mmol/L (ref 135–145)

## 2024-02-13 LAB — SARS CORONAVIRUS 2 BY RT PCR: SARS Coronavirus 2 by RT PCR: POSITIVE — AB

## 2024-02-13 MED ORDER — AZITHROMYCIN 250 MG PO TABS: 250.0000 mg | ORAL_TABLET | Freq: Every day | ORAL | 0 refills | Status: AC

## 2024-02-13 MED ORDER — ALBUTEROL SULFATE HFA 108 (90 BASE) MCG/ACT IN AERS: 1.0000 | INHALATION_SPRAY | Freq: Four times a day (QID) | RESPIRATORY_TRACT | 3 refills | Status: AC | PRN

## 2024-02-13 MED ORDER — ACETAMINOPHEN 325 MG PO TABS
650.0000 mg | ORAL_TABLET | Freq: Once | ORAL | Status: AC
  Administered 2024-02-13: 650 mg via ORAL
  Filled 2024-02-13: qty 2

## 2024-02-13 MED ORDER — PAXLOVID (300/100) 20 X 150 MG & 10 X 100MG PO TBPK: 3.0000 | ORAL_TABLET | Freq: Two times a day (BID) | ORAL | 0 refills | Status: AC

## 2024-02-13 MED ORDER — IPRATROPIUM-ALBUTEROL 0.5-2.5 (3) MG/3ML IN SOLN
3.0000 mL | Freq: Once | RESPIRATORY_TRACT | Status: AC
  Administered 2024-02-13: 3 mL via RESPIRATORY_TRACT
  Filled 2024-02-13: qty 3

## 2024-02-13 MED ORDER — ALBUTEROL SULFATE HFA 108 (90 BASE) MCG/ACT IN AERS
2.0000 | INHALATION_SPRAY | Freq: Once | RESPIRATORY_TRACT | Status: AC
  Administered 2024-02-13: 2 via RESPIRATORY_TRACT
  Filled 2024-02-13: qty 6.7

## 2024-02-13 MED ORDER — PREDNISONE 20 MG PO TABS: 60.0000 mg | ORAL_TABLET | Freq: Every day | ORAL | 0 refills | Status: AC

## 2024-02-13 MED ORDER — MAGNESIUM SULFATE 2 GM/50ML IV SOLN
2.0000 g | Freq: Once | INTRAVENOUS | Status: AC
  Administered 2024-02-13: 2 g via INTRAVENOUS
  Filled 2024-02-13: qty 50

## 2024-02-13 MED ORDER — METHYLPREDNISOLONE SODIUM SUCC 125 MG IJ SOLR
125.0000 mg | Freq: Once | INTRAMUSCULAR | Status: AC
  Administered 2024-02-13: 125 mg via INTRAVENOUS
  Filled 2024-02-13: qty 2

## 2024-02-13 NOTE — ED Provider Notes (Signed)
  EMERGENCY DEPARTMENT AT [facility] Provider Note   CSN: [id number] Arrival date & time: 02/13/24  2039     Patient presents with: Shortness of Breath   Kristen Ryan is a 68 y.o. female.  She has a history of COPD.  Complaining of cough minimally productive and short of breath for a week.  No vomiting or diarrhea.  She had a virtual visit with her doctor who recommended she come to the emergency department for further evaluation.  She uses oxygen  as needed.  Not using it.  {Add pertinent medical, surgical, social history, OB history to YEP:67052} The history is provided by the patient.  Shortness of Breath Severity:  Moderate Onset quality:  Gradual Duration:  1 week Timing:  Constant Progression:  Worsening Chronicity:  New Relieved by:  None tried Associated symptoms: cough   Associated symptoms: no chest pain, no fever, no hemoptysis, no sputum production and no vomiting   Risk factors: no tobacco use        Prior to Admission medications   Medication Sig Start Date End Date Taking? Authorizing Provider  acetaminophen  (TYLENOL ) 500 MG tablet Take 1,000 mg by mouth every 6 (six) hours as needed for mild pain (pain score 1-3).    [provider]  albuterol  (VENTOLIN  HFA) 108 (90 Base) MCG/ACT inhaler Inhale 1-2 puffs into the lungs every 6 (six) hours as needed for wheezing or shortness of breath. 07/08/21   Pearlean Manus, MD  calcium-vitamin D  (OSCAL WITH D) 500-200 MG-UNIT tablet Take 1 tablet by mouth 2 (two) times daily.    [provider]  diclofenac Sodium (VOLTAREN) 1 % GEL Apply 2-4 g topically daily as needed (pain).    [provider]  ferrous sulfate 325 (65 FE) MG tablet Take 325 mg by mouth 2 (two) times daily before a meal.    [provider]  gabapentin  (NEURONTIN ) 600 MG tablet Take 1 tablet (600 mg total) by mouth 2 (two) times daily. TAKE (1) TABLET BY MOUTH TWICE DAILY. 09/07/22   Barbra Jayson LABOR,  MD  GEMTESA 75 MG TABS Take 1 tablet by mouth daily. 03/03/23   [provider]  levothyroxine  (SYNTHROID ) 50 MCG tablet Take 1 tablet by mouth daily. 12/27/18   [provider]  lisinopril  (ZESTRIL ) 2.5 MG tablet Take 2.5 mg by mouth daily. 07/01/21   [provider]  Magnesium  250 MG TABS Take 400 mg by mouth every evening.    [provider]  meclizine (ANTIVERT) 25 MG tablet Take 25 mg by mouth 2 (two) times daily as needed for dizziness.    [provider]  methocarbamol  (ROBAXIN ) 750 MG tablet Take 1 tablet (750 mg total) by mouth 2 (two) times daily. 05/25/23   Maree, Pratik D, DO  montelukast  (SINGULAIR ) 10 MG tablet Take 10 mg by mouth at bedtime. 02/15/21   [provider]  omeprazole  (PRILOSEC) 40 MG capsule Take 40 mg by mouth 2 (two) times daily before a meal. 11/18/20   [provider]  ondansetron  (ZOFRAN -ODT) 4 MG disintegrating tablet 4 mg every 8 (eight) hours as needed for vomiting or nausea. Sublingual route 03/04/22   [provider]  oxyCODONE -acetaminophen  (PERCOCET) 10-325 MG tablet Take 1 tablet by mouth every 8 (eight) hours. 05/25/23   Maree, Pratik D, DO  polyethylene glycol (MIRALAX  / GLYCOLAX ) 17 g packet Take 17 g by mouth daily as needed for mild constipation. 05/25/23   Maree, Pratik D, DO  ziprasidone  (  GEODON ) 80 MG capsule Take 1 capsule (80 mg total) by mouth 2 (two) times daily with a meal. 05/25/23   Maree, Pratik D, DO    Allergies: Aminophylline, Penicillins, Sumatriptan, Theophylline, Bee pollen, Codeine , Divalproex sodium, Divalproex sodium, Doxycycline, Hydroxyzine  pamoate, Lamotrigine, Lithium , Metronidazole , Nabumetone, Naproxen , Other, Pentazocine lactate, Risperidone, Seroquel [quetiapine], Sulfamethoxazole -trimethoprim , Tegretol [carbamazepine], Vistaril  [hydroxyzine  hcl], Clonidine  derivatives, Tetracycline, and Zyprexa [olanzapine]    Review of Systems  Constitutional:  Negative for  fever.  Respiratory:  Positive for cough and shortness of breath. Negative for hemoptysis and sputum production.   Cardiovascular:  Negative for chest pain.  Gastrointestinal:  Negative for diarrhea, nausea and vomiting.    Updated Vital Signs Ht 5' 7 (1.702 m)   Wt 51.8 kg   BMI 17.89 kg/m   Physical Exam Vitals and nursing note reviewed.  Constitutional:      General: She is not in acute distress.    Appearance: Normal appearance. She is well-developed.  HENT:     Head: Normocephalic and atraumatic.  Eyes:     Conjunctiva/sclera: Conjunctivae normal.  Cardiovascular:     Rate and Rhythm: Normal rate and regular rhythm.     Heart sounds: No murmur heard. Pulmonary:     Effort: Tachypnea and accessory muscle usage present. No respiratory distress.     Breath sounds: No stridor. Rhonchi present. No wheezing.  Abdominal:     Palpations: Abdomen is soft.     Tenderness: There is no abdominal tenderness. There is no guarding or rebound.  Musculoskeletal:        General: No tenderness or deformity.     Cervical back: Neck supple.     Right lower leg: No edema.     Left lower leg: No edema.  Skin:    General: Skin is warm and dry.  Neurological:     General: No focal deficit present.     Mental Status: She is alert.     GCS: GCS eye subscore is 4. GCS verbal subscore is 5. GCS motor subscore is 6.     (all labs ordered are listed, but only abnormal results are displayed) Labs Reviewed - No data to display  EKG: None  Radiology: No results found.  {Document cardiac monitor, telemetry assessment procedure when appropriate:32947} Procedures   Medications Ordered in the ED - No data to display    {Click here for ABCD2, HEART and other calculators REFRESH Note before signing:1}                              Medical Decision Making Amount and/or Complexity of Data Reviewed Labs: ordered. Radiology: ordered.   This patient complains of ***; this involves an  extensive number of treatment Options and is a complaint that carries with it a high risk of complications and morbidity. The differential includes ***  I ordered, reviewed and interpreted labs, which included *** I ordered medication *** and reviewed PMP when indicated. I ordered imaging studies which included *** and I independently    visualized and interpreted imaging which showed *** Additional history obtained from *** Previous records obtained and reviewed *** I consulted *** and discussed lab and imaging findings and discussed disposition.  Cardiac monitoring reviewed, *** Social determinants considered, *** Critical Interventions: ***  After the interventions stated above, I reevaluated the patient and found *** Admission and further testing considered, ***   {Document critical care time when appropriate  Document review  of labs and clinical decision tools ie CHADS2VASC2, etc  Document your independent review of radiology images and any outside records  Document your discussion with family members, caretakers and with consultants  Document social determinants of health affecting pt's care  Document your decision making why or why not admission, treatments were needed:32947:::1}   Final diagnoses:  None    ED Discharge Orders     None

## 2024-02-13 NOTE — ED Triage Notes (Signed)
 Pt to ED from home with c/o sob x one week, pt reports having oxygen  at home but she has not used it.

## 2024-02-13 NOTE — Discharge Instructions (Signed)
 You were seen in the emergency department for cough and shortness of breath.  You tested positive for COVID.  Your chest x-ray did not show any signs of pneumonia and your oxygen  level was stable.  We are starting you on steroids and breathing treatment.  Paxlovid  may be helpful.  Follow-up with your primary care doctor.  Return to the emergency department if any worsening or concerning symptoms

## 2024-02-16 DIAGNOSIS — J449 Chronic obstructive pulmonary disease, unspecified: Secondary | ICD-10-CM | POA: Diagnosis not present

## 2024-02-18 LAB — CULTURE, BLOOD (ROUTINE X 2)
Culture: NO GROWTH
Culture: NO GROWTH
Special Requests: ADEQUATE

## 2024-03-02 DIAGNOSIS — R1010 Upper abdominal pain, unspecified: Secondary | ICD-10-CM | POA: Diagnosis not present

## 2024-03-02 DIAGNOSIS — R109 Unspecified abdominal pain: Secondary | ICD-10-CM | POA: Diagnosis not present

## 2024-03-02 DIAGNOSIS — G894 Chronic pain syndrome: Secondary | ICD-10-CM | POA: Diagnosis not present

## 2024-03-02 DIAGNOSIS — I10 Essential (primary) hypertension: Secondary | ICD-10-CM | POA: Diagnosis not present

## 2024-03-02 DIAGNOSIS — K219 Gastro-esophageal reflux disease without esophagitis: Secondary | ICD-10-CM | POA: Diagnosis not present

## 2024-03-02 DIAGNOSIS — D649 Anemia, unspecified: Secondary | ICD-10-CM | POA: Diagnosis not present

## 2024-03-02 DIAGNOSIS — E441 Mild protein-calorie malnutrition: Secondary | ICD-10-CM | POA: Diagnosis not present

## 2024-03-02 DIAGNOSIS — G43909 Migraine, unspecified, not intractable, without status migrainosus: Secondary | ICD-10-CM | POA: Diagnosis not present

## 2024-03-02 DIAGNOSIS — J449 Chronic obstructive pulmonary disease, unspecified: Secondary | ICD-10-CM | POA: Diagnosis not present

## 2024-03-10 DIAGNOSIS — J4489 Other specified chronic obstructive pulmonary disease: Secondary | ICD-10-CM | POA: Diagnosis not present

## 2024-03-10 DIAGNOSIS — R262 Difficulty in walking, not elsewhere classified: Secondary | ICD-10-CM | POA: Diagnosis not present

## 2024-03-10 DIAGNOSIS — M6289 Other specified disorders of muscle: Secondary | ICD-10-CM | POA: Diagnosis not present

## 2024-03-10 DIAGNOSIS — I10 Essential (primary) hypertension: Secondary | ICD-10-CM | POA: Diagnosis not present

## 2024-03-13 ENCOUNTER — Ambulatory Visit (HOSPITAL_COMMUNITY)
Admission: RE | Admit: 2024-03-13 | Discharge: 2024-03-13 | Disposition: A | Discharge status: Home / Self Care | Source: Ambulatory Visit | Attending: Internal Medicine | Admitting: Internal Medicine

## 2024-03-13 ENCOUNTER — Other Ambulatory Visit (HOSPITAL_COMMUNITY): Payer: Self-pay | Admitting: Internal Medicine

## 2024-03-13 DIAGNOSIS — R079 Chest pain, unspecified: Secondary | ICD-10-CM | POA: Diagnosis not present

## 2024-03-13 DIAGNOSIS — R0781 Pleurodynia: Secondary | ICD-10-CM | POA: Insufficient documentation

## 2024-03-13 DIAGNOSIS — Z713 Dietary counseling and surveillance: Secondary | ICD-10-CM | POA: Diagnosis not present

## 2024-03-13 DIAGNOSIS — M545 Low back pain, unspecified: Secondary | ICD-10-CM | POA: Diagnosis not present

## 2024-03-13 DIAGNOSIS — Z23 Encounter for immunization: Secondary | ICD-10-CM | POA: Diagnosis not present

## 2024-03-13 DIAGNOSIS — Z681 Body mass index (BMI) 19 or less, adult: Secondary | ICD-10-CM | POA: Diagnosis not present

## 2024-03-13 DIAGNOSIS — Z87891 Personal history of nicotine dependence: Secondary | ICD-10-CM | POA: Diagnosis not present

## 2024-03-13 DIAGNOSIS — E441 Mild protein-calorie malnutrition: Secondary | ICD-10-CM | POA: Diagnosis not present

## 2024-03-13 DIAGNOSIS — G894 Chronic pain syndrome: Secondary | ICD-10-CM | POA: Diagnosis not present

## 2024-03-13 DIAGNOSIS — I10 Essential (primary) hypertension: Secondary | ICD-10-CM | POA: Diagnosis not present

## 2024-03-13 DIAGNOSIS — Z79899 Other long term (current) drug therapy: Secondary | ICD-10-CM | POA: Diagnosis not present

## 2024-03-27 ENCOUNTER — Other Ambulatory Visit (HOSPITAL_COMMUNITY): Payer: Self-pay | Admitting: Internal Medicine

## 2024-03-27 DIAGNOSIS — R1012 Left upper quadrant pain: Secondary | ICD-10-CM

## 2024-04-09 DIAGNOSIS — M6289 Other specified disorders of muscle: Secondary | ICD-10-CM | POA: Diagnosis not present

## 2024-04-09 DIAGNOSIS — J4489 Other specified chronic obstructive pulmonary disease: Secondary | ICD-10-CM | POA: Diagnosis not present

## 2024-04-09 DIAGNOSIS — I10 Essential (primary) hypertension: Secondary | ICD-10-CM | POA: Diagnosis not present

## 2024-04-17 DIAGNOSIS — J449 Chronic obstructive pulmonary disease, unspecified: Secondary | ICD-10-CM | POA: Diagnosis not present

## 2024-04-25 DIAGNOSIS — E039 Hypothyroidism, unspecified: Secondary | ICD-10-CM | POA: Diagnosis not present

## 2024-04-25 DIAGNOSIS — I10 Essential (primary) hypertension: Secondary | ICD-10-CM | POA: Diagnosis not present

## 2024-04-25 DIAGNOSIS — D649 Anemia, unspecified: Secondary | ICD-10-CM | POA: Diagnosis not present

## 2024-05-01 DIAGNOSIS — M25569 Pain in unspecified knee: Secondary | ICD-10-CM | POA: Diagnosis not present

## 2024-05-01 DIAGNOSIS — G894 Chronic pain syndrome: Secondary | ICD-10-CM | POA: Diagnosis not present

## 2024-05-01 DIAGNOSIS — G8929 Other chronic pain: Secondary | ICD-10-CM | POA: Diagnosis not present

## 2024-05-01 DIAGNOSIS — L219 Seborrheic dermatitis, unspecified: Secondary | ICD-10-CM | POA: Diagnosis not present

## 2024-05-01 DIAGNOSIS — I10 Essential (primary) hypertension: Secondary | ICD-10-CM | POA: Diagnosis not present

## 2024-05-01 DIAGNOSIS — E441 Mild protein-calorie malnutrition: Secondary | ICD-10-CM | POA: Diagnosis not present

## 2024-05-01 DIAGNOSIS — M545 Low back pain, unspecified: Secondary | ICD-10-CM | POA: Diagnosis not present

## 2024-05-01 DIAGNOSIS — Z0000 Encounter for general adult medical examination without abnormal findings: Secondary | ICD-10-CM | POA: Diagnosis not present

## 2024-05-01 DIAGNOSIS — Z0001 Encounter for general adult medical examination with abnormal findings: Secondary | ICD-10-CM | POA: Diagnosis not present

## 2024-05-01 DIAGNOSIS — D649 Anemia, unspecified: Secondary | ICD-10-CM | POA: Diagnosis not present

## 2025-01-22 ENCOUNTER — Ambulatory Visit (HOSPITAL_COMMUNITY): Admitting: Registered Nurse
